# Patient Record
Sex: Male | Born: 1955 | Race: Black or African American | Hispanic: No | Marital: Single | Smoking: Current every day smoker
Health system: Southern US, Community
[De-identification: ages and names within clinical notes are randomized; demographics above are authoritative.]

## PROBLEM LIST (undated history)

## (undated) DIAGNOSIS — I639 Cerebral infarction, unspecified: Secondary | ICD-10-CM

## (undated) DIAGNOSIS — C801 Malignant (primary) neoplasm, unspecified: Secondary | ICD-10-CM

## (undated) DIAGNOSIS — I519 Heart disease, unspecified: Secondary | ICD-10-CM

## (undated) DIAGNOSIS — J189 Pneumonia, unspecified organism: Secondary | ICD-10-CM

## (undated) DIAGNOSIS — M87059 Idiopathic aseptic necrosis of unspecified femur: Secondary | ICD-10-CM

## (undated) DIAGNOSIS — K219 Gastro-esophageal reflux disease without esophagitis: Secondary | ICD-10-CM

## (undated) DIAGNOSIS — I8290 Acute embolism and thrombosis of unspecified vein: Secondary | ICD-10-CM

## (undated) DIAGNOSIS — I6523 Occlusion and stenosis of bilateral carotid arteries: Secondary | ICD-10-CM

## (undated) DIAGNOSIS — I739 Peripheral vascular disease, unspecified: Secondary | ICD-10-CM

## (undated) DIAGNOSIS — I10 Essential (primary) hypertension: Secondary | ICD-10-CM

## (undated) DIAGNOSIS — I2510 Atherosclerotic heart disease of native coronary artery without angina pectoris: Secondary | ICD-10-CM

## (undated) DIAGNOSIS — J45909 Unspecified asthma, uncomplicated: Secondary | ICD-10-CM

## (undated) HISTORY — DX: Cerebral infarction, unspecified: I63.9

## (undated) HISTORY — DX: Peripheral vascular disease, unspecified: I73.9

## (undated) HISTORY — PX: WRIST SURGERY: SHX841

## (undated) HISTORY — PX: FEMORAL BYPASS: SHX50

## (undated) HISTORY — DX: Occlusion and stenosis of bilateral carotid arteries: I65.23

## (undated) HISTORY — PX: MANDIBLE SURGERY: SHX707

---

## 2007-01-22 ENCOUNTER — Emergency Department: Payer: Self-pay | Admitting: Unknown Physician Specialty

## 2010-10-28 ENCOUNTER — Emergency Department: Payer: Self-pay | Admitting: Emergency Medicine

## 2010-11-06 ENCOUNTER — Emergency Department: Payer: Self-pay

## 2011-02-01 ENCOUNTER — Emergency Department: Payer: Self-pay | Admitting: Emergency Medicine

## 2014-02-05 ENCOUNTER — Observation Stay: Payer: Self-pay | Admitting: Internal Medicine

## 2014-02-05 LAB — CK TOTAL AND CKMB (NOT AT ARMC)
CK, TOTAL: 182 U/L
CK, Total: 172 U/L
CK, Total: 171 U/L
CK-MB: 1.9 ng/mL (ref 0.5–3.6)
CK-MB: 5.8 ng/mL — ABNORMAL HIGH (ref 0.5–3.6)
CK-MB: 6.2 ng/mL — ABNORMAL HIGH (ref 0.5–3.6)

## 2014-02-05 LAB — CK-MB: CK-MB: 4.9 ng/mL — AB (ref 0.5–3.6)

## 2014-02-05 LAB — CBC
HCT: 46.8 % (ref 40.0–52.0)
HGB: 15 g/dL (ref 13.0–18.0)
MCH: 30 pg (ref 26.0–34.0)
MCHC: 32.1 g/dL (ref 32.0–36.0)
MCV: 93 fL (ref 80–100)
Platelet: 233 10*3/uL (ref 150–440)
RBC: 5.01 10*6/uL (ref 4.40–5.90)
RDW: 14.1 % (ref 11.5–14.5)
WBC: 6.5 10*3/uL (ref 3.8–10.6)

## 2014-02-05 LAB — COMPREHENSIVE METABOLIC PANEL
ALK PHOS: 93 U/L
ANION GAP: 6 — AB (ref 7–16)
AST: 36 U/L (ref 15–37)
Albumin: 3.5 g/dL (ref 3.4–5.0)
BILIRUBIN TOTAL: 0.6 mg/dL (ref 0.2–1.0)
BUN: 15 mg/dL (ref 7–18)
CREATININE: 1.34 mg/dL — AB (ref 0.60–1.30)
Calcium, Total: 8.7 mg/dL (ref 8.5–10.1)
Chloride: 110 mmol/L — ABNORMAL HIGH (ref 98–107)
Co2: 24 mmol/L (ref 21–32)
EGFR (Non-African Amer.): 58 — ABNORMAL LOW
Glucose: 100 mg/dL — ABNORMAL HIGH (ref 65–99)
Osmolality: 280 (ref 275–301)
Potassium: 4.2 mmol/L (ref 3.5–5.1)
SGPT (ALT): 30 U/L
Sodium: 140 mmol/L (ref 136–145)
Total Protein: 8.1 g/dL (ref 6.4–8.2)

## 2014-02-05 LAB — CBC WITH DIFFERENTIAL/PLATELET
BASOS ABS: 0.1 10*3/uL (ref 0.0–0.1)
Basophil %: 2 %
Eosinophil #: 0.7 10*3/uL (ref 0.0–0.7)
Eosinophil %: 12.3 %
HCT: 45.7 % (ref 40.0–52.0)
HGB: 14.5 g/dL (ref 13.0–18.0)
LYMPHS PCT: 46.4 %
Lymphocyte #: 2.8 10*3/uL (ref 1.0–3.6)
MCH: 30.1 pg (ref 26.0–34.0)
MCHC: 31.8 g/dL — ABNORMAL LOW (ref 32.0–36.0)
MCV: 95 fL (ref 80–100)
MONOS PCT: 11.4 %
Monocyte #: 0.7 x10 3/mm (ref 0.2–1.0)
Neutrophil #: 1.7 10*3/uL (ref 1.4–6.5)
Neutrophil %: 27.9 %
Platelet: 223 10*3/uL (ref 150–440)
RBC: 4.81 10*6/uL (ref 4.40–5.90)
RDW: 14 % (ref 11.5–14.5)
WBC: 6 10*3/uL (ref 3.8–10.6)

## 2014-02-05 LAB — PROTIME-INR
INR: 1
Prothrombin Time: 13.4 secs (ref 11.5–14.7)

## 2014-02-05 LAB — TROPONIN I
TROPONIN-I: 1.9 ng/mL — AB
TROPONIN-I: 2.3 ng/mL — AB
Troponin-I: 0.03 ng/mL

## 2014-02-05 LAB — HEPARIN LEVEL (UNFRACTIONATED): Anti-Xa(Unfractionated): 0.65 IU/mL (ref 0.30–0.70)

## 2014-02-05 LAB — APTT

## 2014-02-06 LAB — CBC WITH DIFFERENTIAL/PLATELET
Basophil #: 0.1 10*3/uL (ref 0.0–0.1)
Basophil %: 0.9 %
EOS ABS: 0.8 10*3/uL — AB (ref 0.0–0.7)
EOS PCT: 11 %
HCT: 41.8 % (ref 40.0–52.0)
HGB: 13.6 g/dL (ref 13.0–18.0)
LYMPHS PCT: 52.3 %
Lymphocyte #: 3.9 10*3/uL — ABNORMAL HIGH (ref 1.0–3.6)
MCH: 30.5 pg (ref 26.0–34.0)
MCHC: 32.6 g/dL (ref 32.0–36.0)
MCV: 94 fL (ref 80–100)
MONO ABS: 0.7 x10 3/mm (ref 0.2–1.0)
Monocyte %: 8.9 %
NEUTROS ABS: 2 10*3/uL (ref 1.4–6.5)
NEUTROS PCT: 26.9 %
PLATELETS: 215 10*3/uL (ref 150–440)
RBC: 4.47 10*6/uL (ref 4.40–5.90)
RDW: 13.9 % (ref 11.5–14.5)
WBC: 7.4 10*3/uL (ref 3.8–10.6)

## 2014-02-06 LAB — DRUG SCREEN, URINE
AMPHETAMINES, UR SCREEN: NEGATIVE (ref ?–1000)
BENZODIAZEPINE, UR SCRN: NEGATIVE (ref ?–200)
Barbiturates, Ur Screen: NEGATIVE (ref ?–200)
Cannabinoid 50 Ng, Ur ~~LOC~~: NEGATIVE (ref ?–50)
Cocaine Metabolite,Ur ~~LOC~~: POSITIVE (ref ?–300)
MDMA (ECSTASY) UR SCREEN: NEGATIVE (ref ?–500)
Methadone, Ur Screen: NEGATIVE (ref ?–300)
OPIATE, UR SCREEN: NEGATIVE (ref ?–300)
Phencyclidine (PCP) Ur S: NEGATIVE (ref ?–25)
Tricyclic, Ur Screen: NEGATIVE (ref ?–1000)

## 2014-02-06 LAB — BASIC METABOLIC PANEL
Anion Gap: 10 (ref 7–16)
BUN: 16 mg/dL (ref 7–18)
Calcium, Total: 8.8 mg/dL (ref 8.5–10.1)
Chloride: 105 mmol/L (ref 98–107)
Co2: 25 mmol/L (ref 21–32)
Creatinine: 1.19 mg/dL (ref 0.60–1.30)
EGFR (Non-African Amer.): >60
GLUCOSE: 99 mg/dL (ref 65–99)
Osmolality: 281 (ref 275–301)
Potassium: 3.8 mmol/L (ref 3.5–5.1)
SODIUM: 140 mmol/L (ref 136–145)

## 2014-02-06 LAB — HEPARIN LEVEL (UNFRACTIONATED): ANTI-XA(UNFRACTIONATED): 0.56 [IU]/mL (ref 0.30–0.70)

## 2014-11-03 NOTE — Consult Note (Signed)
Present Illness 59 year old male with history of substance abuse presents to the emergency room with left-sided chest pain.  Patient is a very diffuse historian.  He initially denies any prior cardiac history but states he was recently at [facility] in told that he had a probable  cardiac problem that needed to be evaluated by a cardiologist.  He has not seen 1 per his report.  It is unclear with the is taken any medications.  He denies illicit drug use at present.  He states the pain is worse with deep palpation as well as deep breathing.  The his electrocardiogram shows no injury current.  His initial troponin was normal subsequent lawn was 1.9.  He has mild renal insufficiency.  He etiology the pain is unclear but certainly could be secondary to coronary disease.  A tox screen is pending.   Physical Exam:  GEN no acute distress, thin   HEENT PERRL, hearing intact to voice   NECK supple   RESP clear BS   CARD Regular rate and rhythm  No murmur   ABD denies tenderness  normal BS   LYMPH negative neck, negative axillae   EXTR negative cyanosis/clubbing, negative edema   SKIN normal to palpation   NEURO cranial nerves intact, motor/sensory function intact   PSYCH A+O to time, place, person   Review of Systems:  Subjective/Chief Complaint African American male complaining of chest pain   General: Weakness   Skin: No Complaints   ENT: No Complaints   Eyes: No Complaints   Neck: No Complaints   Respiratory: Short of breath   Cardiovascular: Chest pain or discomfort  Tightness   Gastrointestinal: No Complaints   Genitourinary: No Complaints   Vascular: No Complaints   Musculoskeletal: No Complaints   Neurologic: No Complaints   Hematologic: No Complaints   Endocrine: No Complaints   Psychiatric: No Complaints   Review of Systems: All other systems were reviewed and found to be negative   Medications/Allergies Reviewed Medications/Allergies reviewed    Family & Social History:  Family and Social History:  Family History Coronary Artery Disease  Smoking   Social History positive  tobacco, positive Illicit drugs   + Tobacco Current (within 1 year)   EKG:  EKG NSR   Abnormal NSSTTW changes   Interpretation nonspecific ST T wave changes    Penicillin: N/V   Impression 59 year old male with a diffuse history.  He states he has been a [facility] in told he needed to see a heart doctor.  He apparently has not done that.  He was evaluated there recently records currently unavailable.  He states he has left-sided chest pain.  This is worsened with deep breathing.  It also was worse with deep palpation.  He has a mild troponin of 1.19.  He also has mild renal insufficiency.  Would continue with nitrates and heparin and rule out for myocardial infarction.  Will proceed with an echocardiogram to evaluate LV function as well as to evaluate for wall motion abnormality valvular abnormalities.  Based on his clinical course over the next 12-24 hr, will determine medical therapy versus is invasive evaluation.   Plan 1. Continue to rule out for myocardial infarction 2. Echocardiogram to evaluate LV function 3. Continue with heparin nitrates aspirin.  Will hold beta-blockers until cocaine history is elucidated with tox screen 4. Attempt to obtain records from [facility] 5. Consider invasive evaluation versus empiric medical therapy depending on the records obtained in  the patient's clinical course.   Electronic Signatures: Teodoro Spray (MD)  (Signed 27-Jul-15 16:57)  Authored: General Aspect/Present Illness, History and Physical Exam, Review of System, Family & Social History, EKG , Allergies, Impression/Plan   Last Updated: 27-Jul-15 16:57 by Teodoro Spray (MD)

## 2014-11-03 NOTE — H&P (Signed)
PATIENT NAME:  Timothy Galvan, Timothy Galvan MR#:  [id number] DATE OF BIRTH:  August 17, 1955  DATE OF ADMISSION:  02/05/2014  PRIMARY CARE PHYSICIAN:  Dr. Shade Flood with [facility].   REFERRING EMERGENCY ROOM PHYSICIAN:  Dr. Jacqualine Code.  CHIEF COMPLAINT: Chest pain.   HISTORY OF PRESENT ILLNESS:  This 59 year old man with medical history of COPD, hyperlipidemia, peptic ulcer disease and probable coronary artery disease, presents this morning with left-sided chest pain. He reports that he woke up and walked around, then upon sitting back down had acute onset of sharp left-sided chest pain, left neck pain and left arm numbness. He attempted to take an antacid, ate baking soda and tomatoes, with no relief. The pain, increased and he became short of breath. He used his inhaler which helped slightly but still felt chest pressure and significant shortness of breath. He also reports that he was diaphoretic, cold, nauseated. He reports that over the past few weeks he has had substernal chest pain, different than the symptoms described above, which he has been attributing to peptic ulcer disease or gastroesophageal reflux.   In the Emergency Room he continues to have left shoulder pain and hand numbness. He reports that he was at the Emergency Room of one at the [place] 2-3 weeks ago where he was told that he had some sort of cardiac disease and irregular heart rate and was referred to a cardiologist. He has never seen a cardiologist at this time.   PAST MEDICAL HISTORY:  1.  History of peptic ulcer disease and/or gastroesophageal reflux.  2.  COPD.  3.  Hyperlipidemia.  4.  History of colon polyps.   PAST SURGICAL HISTORY: 1.  Right wrist repair.  2.  Ganglion cyst removal on the left hand.   ALLERGIES: PENICILLIN CAUSES NAUSEA AND VOMITING.   HOME MEDICATIONS:  1.  Omeprazole 20 mg orally once a day.  2.  Nitroglycerin 0.4 mg as needed for chest pain.  3.  Lipitor 40 mg daily.   4.  Isosorbide mononitrate 30 mg extended release once a day.  5.  Ibuprofen 600 mg every 6-8 hours as needed for pain.  6.  Combivent 1 puff inhaled every 6 hours as needed for shortness of breath.  7.  Aspirin 81 mg.   SOCIAL HISTORY: The patient lives alone in [place]. He is disabled due to back pain. He formerly worked at [employer]. He currently smokes 1 pack per day. He does drink beer, stopped drinking liquor because it exacerbated his peptic ulcer disease about 1 year ago. He reports occasional cocaine use.   FAMILY HISTORY: Positive for coronary artery disease, diabetes, hypertension, stroke, colon cancer in both parents, breast cancer in his mother.   REVIEW OF SYSTEMS:  CONSTITUTIONAL: Denies fever, weight loss or weight gain, positive for diaphoresis, fatigue.  HEENT: Negative for change in vision, eye pain, eye inflammation, change in hearing, oral pain, difficulty swallowing.  RESPIRATORY: Positive for shortness of breath, negative for cough, wheezing, hemoptysis, painful respiration.  CARDIOVASCULAR: Positive for chest pain, negative for edema, palpitations, syncope.  GASTROINTESTINAL: Negative for vomiting, or diarrhea.  Positive for some nausea earlier this morning, negative for abdominal pain, negative for hematemesis, or hematochezia over the past few days.  GENITOURINARY: Negative for dysuria or frequency.  MUSCULOSKELETAL: Positive for left-sided neck and shoulder pain, otherwise no other muscle or joint pain.  NEUROLOGIC: Positive for numbness of the left arm, no weakness, no dysarthria or confusion, no syncope, no  seizure.  PSYCHIATRIC: Negative for anxiety or depression.   PHYSICAL EXAMINATION:  VITAL SIGNS:  Temperature 98.6, pulse 60, respirations 20, blood pressure 158/80, oxygenation 98% on room air.  GENERAL: The patient is alert, oriented, resting calmly, in the exam bed.  HEENT: Pupils are equal, round, and reactive, conjunctiva are clear, extraocular  motion is intact. There is no nasal discharge or deformity, poor dentition with multiple teeth missing, gingival disease, no oral ulcers or exudate, trachea is midline, he does have shotty lymphadenopathy in the anterior cervical chain, no thyromegaly.  PULMONARY: Lungs are clear to auscultation bilaterally with good air movement, no respiratory distress.  CARDIOVASCULAR: Regular rate and rhythm, no murmurs, rubs, or gallops, peripheral pulses are 2+, there is no edema.  GASTROINTESTINAL: Bowel sounds are normal, there is no tenderness, guarding, rebound, mass, abdomen is not distended.  MUSCULOSKELETAL: Normal range of motion of the neck and left shoulder, there is decreased sensation in the left hand, there are no tender or swollen joints, strength and tone are equal bilaterally.  SKIN: No rash, diaphoresis, wounds or lesions.  NEUROLOGIC: Cranial nerves II through XII are grossly intact, neurologic examination is nonfocal.  PSYCHIATRIC: Behavior is appropriate, no signs of depression and anxiety.   LABORATORY DATA:  Sodium 140, potassium 4.2, chloride 110, bicarbonate 24, creatinine 1.34, BUN is 15. Glucose is 100, alkaline phosphatase 93, ALT 30, AST 36, protein 8.1, albumin 3.5, troponin is 0.03, CK 172, CK-MB 1.9, white blood cell count 6.5, hemoglobin 15, platelets 233, MCV is 93.   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 No acute cardiopulmonary disease. Emphysematous changes in the upper lobes.   ASSESSMENT AND PLAN: 1.  Chest pain: The patient is admitted to telemetry for acute coronary syndrome rule out. He is currently chest pain free. He has mild changes on his EKG, however, there are no prior EKGs for comparison and these have been requested from [facility]. Currently cardiac enzymes are negative, will continue to cycle. Will observe on telemetry. We will continue with aspirin, statin, nitroglycerin. Will hold off on echocardiogram, as he has been recently admitted to [facility]  and this study may have been done within the past month.  2.  Hypertension: He reports that he does not have hypertension chronically, blood pressures are elevated today. He is on Imdur at home but no other antihypertensive medications. I will start lisinopril at 10 mg.  3.  Polysubstance abuse with cocaine and alcohol: Will obtain a social work consultation. He was counseled regarding the cardiac risks of cocaine use. He understands that alcohol use exacerbates his peptic ulcer disease.  4.  Chronic obstructive pulmonary disease: Stable, oxygen saturations and respiratory status are excellent at this time. Continue home regimen.  5.  Peptic ulcer disease/gastroesophageal reflux disease: Continue with PPI.   Time spent on admission 45 minutes.     ____________________________ Earleen Newport. Volanda Napoleon, MD cpw:lt D: 02/05/2014 12:45:44 ET T: 02/05/2014 13:47:34 ET JOB#: [id number]  cc: Barnetta Chapel P. Volanda Napoleon, MD, <Dictator> Aldean Jewett MD ELECTRONICALLY SIGNED 02/12/2014 16:01

## 2014-11-03 NOTE — Discharge Summary (Signed)
PATIENT NAME:  Timothy Galvan, Timothy Galvan MR#:  [id number] DATE OF BIRTH:  Jan 08, 1956  DATE OF ADMISSION:  02/05/2014 DATE OF DISCHARGE:  02/06/2014  DISPOSITION: Home.   DISCHARGE DIAGNOSES: 1.  Chest pain secondary to cocaine use.  2.  Nonischemic cardiomyopathy.  3.  Peripheral vascular disease with appears.   DISCHARGE MEDICATIONS: 1.  Aspirin 81 mg daily.  2.  Ibuprofen 600 mg every 6-8 hours as needed for headache.  3.  Omeprazole  20 mg p.o. daily.  4.  Nitroglycerin sublingual 0.4 mg every 5 minutes for chest pain.  5.  Lipitor 40 mg for coronary artery disease daily.  6.  Combivent Respimat 1 puff b.i.d.  7.  Imdur 30 mg p.o. daily.  8.  Spiriva 80 mcg inhalation daily.  Discontinue the Coreg on discharge instructions.   DISCHARGE SIGNS: Temperature 98.1, heart rate 68, blood pressure 122/70, saturation 98%, heart rate varying between 40s-60s and most of it is in 40s.   CONSULTATIONS: Cardiology consult with Dr. Ubaldo Glassing.   HOSPITAL COURSE: A 59 year old African American male with history of tobacco abuse, alcohol abuse, polysubstance abuse. Comes in because of left-sided chest pain. The patient's EKG was abnormal and the patient's initial troponin was normal. The patient's 2nd troponin was every to initial troponin 0.03, 2nd troponin went up to 1.9. The patient admitted to hospitalist service for non-ST-elevation MI, started on aspirin and heparin drip. The patient had a cardiac catheterization done this morning and the patient's cardiac catheter showed (normal   coronary arteries and the patient had global LV function, depressed at 35%. The patient has a nonischemic cardiomyopathy. The patient's other labs were normal with CBC and met-B.  The patient was seen by Dr. Ubaldo Glassing.  Also he suggested that medical management for nonischemic  cardiomyopathy including aspirin, statins, ACE inhibitors and beta blockers, but his urine toxicology came back positive with cocaine, so we told the patient he cannot  have Coreg until he stops taking cocaine and his chest pain is probably secondary to his vascular spasm secondary to cocaine use and troponin elevation, likely secondary to cocaine use. The patient told me that he started to smoke over the weekend and in the smoke, he had cocaine which was done without him knowing that the patient was advised strongly to stop using illicit drugs and the patient says that he does have a problem with circulation in the leg and supposed to see vascular surgeon,. The patient right now is on aspirin, statins, ACE inhibitors and nitrites and the patient will continue other medications except the Coreg. He can continue any inhalers.  I advised him to quit smoking.   TIME SPENT ON DISCHARGE PREPARATION: More than 30 minutes.   ____________________________ Epifanio Lesches, MD sk:ds D: 02/06/2014 14:08:58 ET T: 02/06/2014 14:44:28 ET JOB#: [id number]  cc: Epifanio Lesches, MD, <Dictator> Epifanio Lesches MD ELECTRONICALLY SIGNED 02/16/2014 17:55

## 2016-01-09 ENCOUNTER — Emergency Department
Admission: EM | Admit: 2016-01-09 | Discharge: 2016-01-09 | Disposition: A | Discharge status: Home / Self Care | Payer: Medicare Other | Attending: Emergency Medicine | Admitting: Emergency Medicine

## 2016-01-09 ENCOUNTER — Encounter: Payer: Self-pay | Admitting: Emergency Medicine

## 2016-01-09 ENCOUNTER — Emergency Department: Payer: Medicare Other

## 2016-01-09 DIAGNOSIS — R0789 Other chest pain: Secondary | ICD-10-CM | POA: Diagnosis not present

## 2016-01-09 DIAGNOSIS — Z86718 Personal history of other venous thrombosis and embolism: Secondary | ICD-10-CM | POA: Insufficient documentation

## 2016-01-09 DIAGNOSIS — F17200 Nicotine dependence, unspecified, uncomplicated: Secondary | ICD-10-CM | POA: Diagnosis not present

## 2016-01-09 DIAGNOSIS — I2510 Atherosclerotic heart disease of native coronary artery without angina pectoris: Secondary | ICD-10-CM | POA: Diagnosis not present

## 2016-01-09 DIAGNOSIS — R079 Chest pain, unspecified: Secondary | ICD-10-CM | POA: Diagnosis present

## 2016-01-09 DIAGNOSIS — R0600 Dyspnea, unspecified: Secondary | ICD-10-CM | POA: Insufficient documentation

## 2016-01-09 HISTORY — DX: Acute embolism and thrombosis of unspecified vein: I82.90

## 2016-01-09 HISTORY — DX: Heart disease, unspecified: I51.9

## 2016-01-09 LAB — CBC WITH DIFFERENTIAL/PLATELET
Basophils Absolute: 0.1 10*3/uL (ref 0–0.1)
Basophils Relative: 1 %
EOS PCT: 2 %
Eosinophils Absolute: 0.2 10*3/uL (ref 0–0.7)
HCT: 39.7 % — ABNORMAL LOW (ref 40.0–52.0)
Hemoglobin: 13.3 g/dL (ref 13.0–18.0)
LYMPHS ABS: 3 10*3/uL (ref 1.0–3.6)
LYMPHS PCT: 47 %
MCH: 31 pg (ref 26.0–34.0)
MCHC: 33.4 g/dL (ref 32.0–36.0)
MCV: 92.6 fL (ref 80.0–100.0)
MONO ABS: 0.6 10*3/uL (ref 0.2–1.0)
MONOS PCT: 10 %
Neutro Abs: 2.6 10*3/uL (ref 1.4–6.5)
Neutrophils Relative %: 40 %
PLATELETS: 262 10*3/uL (ref 150–440)
RBC: 4.29 MIL/uL — ABNORMAL LOW (ref 4.40–5.90)
RDW: 13.8 % (ref 11.5–14.5)
WBC: 6.5 10*3/uL (ref 3.8–10.6)

## 2016-01-09 LAB — BASIC METABOLIC PANEL
Anion gap: 7 (ref 5–15)
BUN: 23 mg/dL — AB (ref 6–20)
CHLORIDE: 105 mmol/L (ref 101–111)
CO2: 26 mmol/L (ref 22–32)
CREATININE: 1.45 mg/dL — AB (ref 0.61–1.24)
Calcium: 9 mg/dL (ref 8.9–10.3)
GFR calc Af Amer: 59 mL/min — ABNORMAL LOW (ref >60)
GFR calc non Af Amer: 51 mL/min — ABNORMAL LOW (ref >60)
GLUCOSE: 101 mg/dL — AB (ref 65–99)
POTASSIUM: 3.9 mmol/L (ref 3.5–5.1)
Sodium: 138 mmol/L (ref 135–145)

## 2016-01-09 LAB — TROPONIN I: Troponin I: <0.03 ng/mL (ref <0.03)

## 2016-01-09 MED ORDER — IOPAMIDOL (ISOVUE-370) INJECTION 76%
75.0000 mL | Freq: Once | INTRAVENOUS | Status: AC | PRN
  Administered 2016-01-09: 75 mL via INTRAVENOUS

## 2016-01-09 MED ORDER — FAMOTIDINE 20 MG PO TABS: 20.0000 mg | ORAL_TABLET | Freq: Two times a day (BID) | ORAL | Status: DC

## 2016-01-09 MED ORDER — GI COCKTAIL ~~LOC~~
30.0000 mL | ORAL | Status: AC
  Administered 2016-01-09: 30 mL via ORAL

## 2016-01-09 MED ORDER — FAMOTIDINE 20 MG PO TABS
40.0000 mg | ORAL_TABLET | Freq: Once | ORAL | Status: AC
  Administered 2016-01-09: 40 mg via ORAL

## 2016-01-09 MED ORDER — SUCRALFATE 1 G PO TABS: 1.0000 g | ORAL_TABLET | Freq: Four times a day (QID) | ORAL | Status: DC

## 2016-01-09 NOTE — ED Notes (Signed)
Pt reports cp and sob x 2 weeks. States it is intermittent, sharp at times, and pressure most of the time. Pt alert & oriented with NAD noted.

## 2016-01-09 NOTE — Discharge Instructions (Signed)
Nonspecific Chest Pain  °Chest pain can be caused by many different conditions. There is always a chance that your pain could be related to something serious, such as a heart attack or a blood clot in your lungs. Chest pain can also be caused by conditions that are not life-threatening. If you have chest pain, it is very important to follow up with your health care provider. °CAUSES  °Chest pain can be caused by: °· Heartburn. °· Pneumonia or bronchitis. °· Anxiety or stress. °· Inflammation around your heart (pericarditis) or lung (pleuritis or pleurisy). °· A blood clot in your lung. °· A collapsed lung (pneumothorax). It can develop suddenly on its own (spontaneous pneumothorax) or from trauma to the chest. °· Shingles infection (varicella-zoster virus). °· Heart attack. °· Damage to the bones, muscles, and cartilage that make up your chest wall. This can include: °¨ Bruised bones due to injury. °¨ Strained muscles or cartilage due to frequent or repeated coughing or overwork. °¨ Fracture to one or more ribs. °¨ Sore cartilage due to inflammation (costochondritis). °RISK FACTORS  °Risk factors for chest pain may include: °· Activities that increase your risk for trauma or injury to your chest. °· Respiratory infections or conditions that cause frequent coughing. °· Medical conditions or overeating that can cause heartburn. °· Heart disease or family history of heart disease. °· Conditions or health behaviors that increase your risk of developing a blood clot. °· Having had chicken pox (varicella zoster). °SIGNS AND SYMPTOMS °Chest pain can feel like: °· Burning or tingling on the surface of your chest or deep in your chest. °· Crushing, pressure, aching, or squeezing pain. °· Dull or sharp pain that is worse when you move, cough, or take a deep breath. °· Pain that is also felt in your back, neck, shoulder, or arm, or pain that spreads to any of these areas. °Your chest pain may come and go, or it may stay  constant. °DIAGNOSIS °Lab tests or other studies may be needed to find the cause of your pain. Your health care provider may have you take a test called an ambulatory ECG (electrocardiogram). An ECG records your heartbeat patterns at the time the test is performed. You may also have other tests, such as: °· Transthoracic echocardiogram (TTE). During echocardiography, sound waves are used to create a picture of all of the heart structures and to look at how blood flows through your heart. °· Transesophageal echocardiogram (TEE). This is a more advanced imaging test that obtains images from inside your body. It allows your health care provider to see your heart in finer detail. °· Cardiac monitoring. This allows your health care provider to monitor your heart rate and rhythm in real time. °· Holter monitor. This is a portable device that records your heartbeat and can help to diagnose abnormal heartbeats. It allows your health care provider to track your heart activity for several days, if needed. °· Stress tests. These can be done through exercise or by taking medicine that makes your heart beat more quickly. °· Blood tests. °· Imaging tests. °TREATMENT  °Your treatment depends on what is causing your chest pain. Treatment may include: °· Medicines. These may include: °¨ Acid blockers for heartburn. °¨ Anti-inflammatory medicine. °¨ Pain medicine for inflammatory conditions. °¨ Antibiotic medicine, if an infection is present. °¨ Medicines to dissolve blood clots. °¨ Medicines to treat coronary artery disease. °· Supportive care for conditions that do not require medicines. This may include: °¨ Resting. °¨ Applying heat   or cold packs to injured areas. °¨ Limiting activities until pain decreases. °HOME CARE INSTRUCTIONS °· If you were prescribed an antibiotic medicine, finish it all even if you start to feel better. °· Avoid any activities that bring on chest pain. °· Do not use any tobacco products, including  cigarettes, chewing tobacco, or electronic cigarettes. If you need help quitting, ask your health care provider. °· Do not drink alcohol. °· Take medicines only as directed by your health care provider. °· Keep all follow-up visits as directed by your health care provider. This is important. This includes any further testing if your chest pain does not go away. °· If heartburn is the cause for your chest pain, you may be told to keep your head raised (elevated) while sleeping. This reduces the chance that acid will go from your stomach into your esophagus. °· Make lifestyle changes as directed by your health care provider. These may include: °¨ Getting regular exercise. Ask your health care provider to suggest some activities that are safe for you. °¨ Eating a heart-healthy diet. A registered dietitian can help you to learn healthy eating options. °¨ Maintaining a healthy weight. °¨ Managing diabetes, if necessary. °¨ Reducing stress. °SEEK MEDICAL CARE IF: °· Your chest pain does not go away after treatment. °· You have a rash with blisters on your chest. °· You have a fever. °SEEK IMMEDIATE MEDICAL CARE IF:  °· Your chest pain is worse. °· You have an increasing cough, or you cough up blood. °· You have severe abdominal pain. °· You have severe weakness. °· You faint. °· You have chills. °· You have sudden, unexplained chest discomfort. °· You have sudden, unexplained discomfort in your arms, back, neck, or jaw. °· You have shortness of breath at any time. °· You suddenly start to sweat, or your skin gets clammy. °· You feel nauseous or you vomit. °· You suddenly feel light-headed or dizzy. °· Your heart begins to beat quickly, or it feels like it is skipping beats. °These symptoms may represent a serious problem that is an emergency. Do not wait to see if the symptoms will go away. Get medical help right away. Call your local emergency services (911 in the [place] Do not drive yourself to the hospital. °  °This  information is not intended to replace advice given to you by your health care provider. Make sure you discuss any questions you have with your health care provider. °  °Document Released: 04/08/2005 Document Revised: 07/20/2014 Document Reviewed: 02/02/2014 °[employer] Interactive Patient Education ©2016 [employer] ° °

## 2016-01-09 NOTE — ED Provider Notes (Signed)
[facility] Emergency Department Provider Note  ____________________________________________  Time seen: 9:50 AM  I have reviewed the triage vital signs and the nursing notes.   HISTORY  Chief Complaint Chest Pain   HPI Timothy Galvan is a 60 y.o. male who complains of intermittent chest pain shortness of breath for the past 2 weeks. Last 4 a few minutes to a few hours at a time. Sharp, hurts to breathe and cough. Cough is nonproductive. Nonradiating. Not exertional. No associated diaphoresis vomiting dizziness or syncope.  He has chronic angina for which she takes nitroglycerin. This feels different and he has not taken nitroglycerin for it at home today.  With cardiology at [facility] and vascular surgery [facility] he's recently had angiography of his iliac arteries to evaluate occlusion. Also reports a cardiac catheterization about one year ago which did not show any blockages and had no intervention. Also reports a stress test within the last few months which was unremarkable.  Electronic medical record reveals a nuclear medicine stress test performed in March which was unremarkable: Exam: NM MYOCARDIAL PERFUSION SPECT MULTIPLE (One-Day Rest/Stress Tc-37M  Sestamibi - Regadenoson)  ++++++++++++++++++++++++++++++++++++++++++++ Impressions: - Normal myocardial perfusion study  - No evidence for significant ischemia or scar is noted.  - Post stress: Global systolic function is normal. The ejection fraction  calculated at 54%.   ++++++++++++++++++++++++++++++++++++++++++++  Name: Timothy Galvan, Timothy Galvan (Male) Date of Birth: 13-Nov-1955 [phone]) Accession #: [id number] [facility] ID: [id number] Date of Procedure: 10/02/2015    Past medical history Left iliac artery occlusion CAD   There are no active problems to display for this patient.    History reviewed. No pertinent past surgical history. Cardiac catheterization Iliac angiography  Current Outpatient Rx  Name  Route   Sig  Dispense  Refill  . famotidine (PEPCID) 20 MG tablet   Oral   Take 1 tablet (20 mg total) by mouth 2 (two) times daily.   60 tablet   0   . sucralfate (CARAFATE) 1 g tablet   Oral   Take 1 tablet (1 g total) by mouth 4 (four) times daily.   120 tablet   1      Allergies Review of patient's allergies indicates no known allergies.   History reviewed. No pertinent family history.  Social History Social History  Substance Use Topics  . Smoking status: Current Every Day Smoker  . Smokeless tobacco: None  . Alcohol Use: 7.2 oz/week    12 Cans of beer per week    Review of Systems  Constitutional:   No fever or chills.  ENT:   No sore throat. No rhinorrhea. Cardiovascular:   Positive as above chest pain. Respiratory:   Positive dyspnea with nonproductive cough Gastrointestinal:   Negative for abdominal pain, vomiting and diarrhea.  Genitourinary:   Negative for dysuria or difficulty urinating. Musculoskeletal:   Negative for focal pain or swelling Neurological:   Negative for headaches 10-point ROS otherwise negative.  ____________________________________________   PHYSICAL EXAM:  VITAL SIGNS: ED Triage Vitals  Enc Vitals Group     BP 01/09/16 0935 140/77 mmHg     Pulse Rate 01/09/16 0935 66     Resp 01/09/16 0935 20     Temp 01/09/16 0935 97.5 F (36.4 C)     Temp Source 01/09/16 0935 Oral     SpO2 01/09/16 0935 98 %     Weight 01/09/16 0935 134 lb (60.782 kg)     Height 01/09/16 0935 5\' 2"  (1.575 m)  Head Cir --      Peak Flow --      Pain Score 01/09/16 0935 7     Pain Loc --      Pain Edu? --      Excl. in [place]? --     Vital signs reviewed, nursing assessments reviewed.   Constitutional:   Alert and oriented. Well appearing and in no distress. Eyes:   No scleral icterus. No conjunctival pallor. PERRL. EOMI.  No nystagmus. ENT   Head:   Normocephalic and atraumatic.   Nose:   No congestion/rhinnorhea. No septal hematoma    Mouth/Throat:   MMM, no pharyngeal erythema. No peritonsillar mass.    Neck:   No stridor. No SubQ emphysema. No meningismus. Hematological/Lymphatic/Immunilogical:   No cervical lymphadenopathy. Cardiovascular:   RRR. Symmetric bilateral radial and DP pulses.  No murmurs.  Respiratory:   Normal respiratory effort without tachypnea nor retractions. Breath sounds are clear and equal bilaterally. No wheezes/rales/rhonchi. Gastrointestinal:   Soft and nontender. Non distended. There is no CVA tenderness.  No rebound, rigidity, or guarding. Genitourinary:   deferred Musculoskeletal:   Nontender with normal range of motion in all extremities. No joint effusions.  No lower extremity tenderness.  No edema. Neurologic:   Normal speech and language.  CN 2-10 normal. Motor grossly intact. No gross focal neurologic deficits are appreciated.  Skin:    Skin is warm, dry and intact. No rash noted.  No petechiae, purpura, or bullae.  ____________________________________________    LABS (pertinent positives/negatives) (all labs ordered are listed, but only abnormal results are displayed) Labs Reviewed  BASIC METABOLIC PANEL - Abnormal; Notable for the following:    Glucose, Bld 101 (*)    BUN 23 (*)    Creatinine, Ser 1.45 (*)    GFR calc non Af Amer 51 (*)    GFR calc Af Amer 59 (*)    All other components within normal limits  CBC WITH DIFFERENTIAL/PLATELET - Abnormal; Notable for the following:    RBC 4.29 (*)    HCT 39.7 (*)    All other components within normal limits  TROPONIN I   ____________________________________________   EKG  Interpreted by me Sinus rhythm rate of 64, normal axis and intervals. Left ventricular hypertrophy. No acute ischemic changes. Frequent PVCs with 4 PVCs on the strip.  ____________________________________________    RADIOLOGY  CT angiogram chest  unremarkable  ____________________________________________   PROCEDURES   ____________________________________________   INITIAL IMPRESSION / ASSESSMENT AND PLAN / ED COURSE  Pertinent labs & imaging results that were available during my care of the patient were reviewed by me and considered in my medical decision making (see chart for details).  Patient presents with chest pain shortness of breath which is pleuritic in nature but intermittent. This is very atypical. Not consistent with angina. Low suspicion for ACS dissection or pneumothorax. Given the nature of the symptoms and his medical history, a CT angiogram of the chest was performed to evaluate for PE. This was negative and did not reveal any evidence of pneumonia either. The patient is feeling somewhat better after an acids. Troponin negative. I'll have him follow-up with cardiology. He's recently had provocative testing with the MRI stress, so no further benefit to hospitalization at this time.     ____________________________________________   FINAL CLINICAL IMPRESSION(S) / ED DIAGNOSES  Final diagnoses:  Atypical chest pain       Portions of this note were generated with dragon dictation software. Dictation errors  may occur despite best attempts at proofreading.   Carrie Mew, MD 01/09/16 [phone]

## 2016-01-09 NOTE — ED Notes (Signed)
Patient transported to CT 

## 2016-08-05 ENCOUNTER — Emergency Department
Admission: EM | Admit: 2016-08-05 | Discharge: 2016-08-05 | Disposition: A | Discharge status: Home / Self Care | Payer: Medicare Other | Attending: Emergency Medicine | Admitting: Emergency Medicine

## 2016-08-05 ENCOUNTER — Emergency Department: Payer: Medicare Other

## 2016-08-05 ENCOUNTER — Encounter: Payer: Self-pay | Admitting: Emergency Medicine

## 2016-08-05 DIAGNOSIS — R05 Cough: Secondary | ICD-10-CM | POA: Diagnosis not present

## 2016-08-05 DIAGNOSIS — R0602 Shortness of breath: Secondary | ICD-10-CM | POA: Insufficient documentation

## 2016-08-05 DIAGNOSIS — F17200 Nicotine dependence, unspecified, uncomplicated: Secondary | ICD-10-CM | POA: Insufficient documentation

## 2016-08-05 DIAGNOSIS — R079 Chest pain, unspecified: Secondary | ICD-10-CM | POA: Insufficient documentation

## 2016-08-05 DIAGNOSIS — R6889 Other general symptoms and signs: Secondary | ICD-10-CM

## 2016-08-05 LAB — BASIC METABOLIC PANEL
ANION GAP: 9 (ref 5–15)
BUN: 14 mg/dL (ref 6–20)
CHLORIDE: 106 mmol/L (ref 101–111)
CO2: 25 mmol/L (ref 22–32)
Calcium: 9.1 mg/dL (ref 8.9–10.3)
Creatinine, Ser: 1.09 mg/dL (ref 0.61–1.24)
GFR calc non Af Amer: >60 mL/min (ref >60)
Glucose, Bld: 121 mg/dL — ABNORMAL HIGH (ref 65–99)
POTASSIUM: 4.2 mmol/L (ref 3.5–5.1)
SODIUM: 140 mmol/L (ref 135–145)

## 2016-08-05 LAB — CBC
HCT: 45.5 % (ref 40.0–52.0)
HEMOGLOBIN: 15.3 g/dL (ref 13.0–18.0)
MCH: 31.5 pg (ref 26.0–34.0)
MCHC: 33.5 g/dL (ref 32.0–36.0)
MCV: 93.9 fL (ref 80.0–100.0)
Platelets: 158 10*3/uL (ref 150–440)
RBC: 4.85 MIL/uL (ref 4.40–5.90)
RDW: 14.4 % (ref 11.5–14.5)
WBC: 3.8 10*3/uL (ref 3.8–10.6)

## 2016-08-05 LAB — TROPONIN I: Troponin I: <0.03 ng/mL (ref <0.03)

## 2016-08-05 MED ORDER — BENZONATATE 100 MG PO CAPS: 100.0000 mg | ORAL_CAPSULE | Freq: Four times a day (QID) | ORAL | 0 refills | Status: AC | PRN

## 2016-08-05 NOTE — Discharge Instructions (Signed)
Please seek medical attention for any high fevers, chest pain, shortness of breath, change in behavior, persistent vomiting, bloody stool or any other new or concerning symptoms.  

## 2016-08-05 NOTE — ED Triage Notes (Signed)
Pt comes into the ED via POV c/o chest pain that has gotten worse with his flu like symptoms.  MD at [facility] sent him over to have a cardiac assessment done.  Patient able to ambulate to the triage room with no difficulty.  States he has shortness of breath and dizziness associated with the chest pain and flu symptoms.

## 2016-08-05 NOTE — ED Provider Notes (Signed)
[facility] Emergency Department Provider Note   ____________________________________________   I have reviewed the triage vital signs and the nursing notes.   HISTORY  Chief Complaint Chest Pain   History limited by: Not Limited   HPI Timothy Galvan is a 61 y.o. male who presents to the emergency department today from primary care because of concerns for chest pain in the setting of influenza. Patient states that he was told he had influenza today. He states for the past for 5 days however he has been having cough and shortness of breath. He states that this is accompanied by some pain on the left side of his chest. It is worse with coughing. He also feels like he has had chills at night.    Past Medical History:  Diagnosis Date  . Blood clot in vein   . Heart disease     There are no active problems to display for this patient.   Past Surgical History:  Procedure Laterality Date  . MANDIBLE SURGERY    . WRIST SURGERY      Prior to Admission medications   Medication Sig Start Date End Date Taking? Authorizing Provider  famotidine (PEPCID) 20 MG tablet Take 1 tablet (20 mg total) by mouth 2 (two) times daily. 01/09/16   Carrie Mew, MD  sucralfate (CARAFATE) 1 g tablet Take 1 tablet (1 g total) by mouth 4 (four) times daily. 01/09/16   Carrie Mew, MD    Allergies Patient has no known allergies.  No family history on file.  Social History Social History  Substance Use Topics  . Smoking status: Current Every Day Smoker  . Smokeless tobacco: Never Used  . Alcohol use 7.2 oz/week    12 Cans of beer per week    Review of Systems  Constitutional: Positive for chills. Cardiovascular: Positive for chest pain. Respiratory: Positive for shortness of breath and cough. Gastrointestinal: Negative for abdominal pain, vomiting and diarrhea. Neurological: Negative for headaches, focal weakness or numbness.  10-point ROS otherwise  negative.  ____________________________________________   PHYSICAL EXAM:  VITAL SIGNS: ED Triage Vitals [08/05/16 1833]  Enc Vitals Group     BP (!) 116/99     Pulse Rate 96     Resp 18     Temp 98 F (36.7 C)     Temp Source Oral     SpO2 98 %     Weight 136 lb (61.7 kg)     Height 5\' 3"  (1.6 m)     Head Circumference      Peak Flow      Pain Score 4    Constitutional: Alert and oriented. Well appearing and in no distress. Eyes: Conjunctivae are normal. Normal extraocular movements. ENT   Head: Normocephalic and atraumatic.   Nose: No congestion/rhinnorhea.   Mouth/Throat: Mucous membranes are moist.   Neck: No stridor. Hematological/Lymphatic/Immunilogical: No cervical lymphadenopathy. Cardiovascular: Normal rate, regular rhythm.  No murmurs, rubs, or gallops. Respiratory: Normal respiratory effort without tachypnea nor retractions. Breath sounds are clear and equal bilaterally. No wheezes/rales/rhonchi. Gastrointestinal: Soft and non tender. No rebound. No guarding.  Genitourinary: Deferred Musculoskeletal: Normal range of motion in all extremities. No lower extremity edema. Neurologic:  Normal speech and language. No gross focal neurologic deficits are appreciated.  Skin:  Skin is warm, dry and intact. No rash noted. Psychiatric: Mood and affect are normal. Speech and behavior are normal. Patient exhibits appropriate insight and judgment.  ____________________________________________    LABS (pertinent positives/negatives)  Labs Reviewed  BASIC METABOLIC PANEL - Abnormal; Notable for the following:       Result Value   Glucose, Bld 121 (*)    All other components within normal limits  CBC  TROPONIN I     ____________________________________________   EKG  I, Nance Pear, attending physician, personally viewed and interpreted this EKG  EKG Time: 1838 Rate: 94 Rhythm: sinus rhythm with pac Axis: rightward axis Intervals: qtc  452 QRS: narrow, LVH ST changes: no st eelvation Impression: abnormal ekg   ____________________________________________    RADIOLOGY  CXR IMPRESSION: Mild peribronchial thickening noted. Bulla at the right lung apex, with underlying emphysematous change. Lungs otherwise grossly clear. _________________   PROCEDURES  Procedures  ____________________________________________   INITIAL IMPRESSION / ASSESSMENT AND PLAN / ED COURSE  Pertinent labs & imaging results that were available during my care of the patient were reviewed by me and considered in my medical decision making (see chart for details).  Patient presents to the emergency department today with concerns for pain in the setting of influenza. Patient states pain is been present for the past 4-5 days. Troponin was negative. Chest x-ray without any overlying pneumonia. At this point the pain likely secondary to influenza. I doubt ACS given negative troponin and length of time of symptoms. Will plan on discharging home. Patient will be given prescription for Tessalon Perles. Already received prescription for an inhaler and Tamiflu by primary care.  ____________________________________________   FINAL CLINICAL IMPRESSION(S) / ED DIAGNOSES  Final diagnoses:  Flu-like symptoms     Note: This dictation was prepared with Dragon dictation. Any transcriptional errors that result from this process are unintentional     Nance Pear, MD 08/05/16 2044

## 2017-03-17 ENCOUNTER — Emergency Department: Payer: Medicare Other

## 2017-03-17 ENCOUNTER — Emergency Department
Admission: EM | Admit: 2017-03-17 | Discharge: 2017-03-17 | Disposition: A | Discharge status: Home / Self Care | Payer: Medicare Other | Attending: Emergency Medicine | Admitting: Emergency Medicine

## 2017-03-17 ENCOUNTER — Encounter: Payer: Self-pay | Admitting: Emergency Medicine

## 2017-03-17 DIAGNOSIS — J42 Unspecified chronic bronchitis: Secondary | ICD-10-CM | POA: Diagnosis not present

## 2017-03-17 DIAGNOSIS — F17210 Nicotine dependence, cigarettes, uncomplicated: Secondary | ICD-10-CM | POA: Diagnosis not present

## 2017-03-17 DIAGNOSIS — J189 Pneumonia, unspecified organism: Secondary | ICD-10-CM | POA: Insufficient documentation

## 2017-03-17 DIAGNOSIS — R05 Cough: Secondary | ICD-10-CM | POA: Diagnosis present

## 2017-03-17 DIAGNOSIS — Z79899 Other long term (current) drug therapy: Secondary | ICD-10-CM | POA: Diagnosis not present

## 2017-03-17 LAB — CBC
HCT: 45.5 % (ref 40.0–52.0)
Hemoglobin: 15.8 g/dL (ref 13.0–18.0)
MCH: 31.9 pg (ref 26.0–34.0)
MCHC: 34.7 g/dL (ref 32.0–36.0)
MCV: 92 fL (ref 80.0–100.0)
PLATELETS: 246 10*3/uL (ref 150–440)
RBC: 4.94 MIL/uL (ref 4.40–5.90)
RDW: 13.8 % (ref 11.5–14.5)
WBC: 6.3 10*3/uL (ref 3.8–10.6)

## 2017-03-17 LAB — BASIC METABOLIC PANEL
Anion gap: 7 (ref 5–15)
BUN: 19 mg/dL (ref 6–20)
CALCIUM: 9.1 mg/dL (ref 8.9–10.3)
CO2: 23 mmol/L (ref 22–32)
CREATININE: 1.25 mg/dL — AB (ref 0.61–1.24)
Chloride: 110 mmol/L (ref 101–111)
Glucose, Bld: 112 mg/dL — ABNORMAL HIGH (ref 65–99)
Potassium: 4.3 mmol/L (ref 3.5–5.1)
Sodium: 140 mmol/L (ref 135–145)

## 2017-03-17 LAB — TROPONIN I

## 2017-03-17 MED ORDER — AZITHROMYCIN 250 MG PO TABS: ORAL_TABLET | ORAL | 0 refills | Status: AC

## 2017-03-17 MED ORDER — HYDROCOD POLST-CPM POLST ER 10-8 MG/5ML PO SUER
5.0000 mL | Freq: Once | ORAL | Status: AC
  Administered 2017-03-17: 5 mL via ORAL
  Filled 2017-03-17: qty 5

## 2017-03-17 MED ORDER — PREDNISONE 20 MG PO TABS
60.0000 mg | ORAL_TABLET | Freq: Once | ORAL | Status: AC
  Administered 2017-03-17: 60 mg via ORAL
  Filled 2017-03-17: qty 3

## 2017-03-17 MED ORDER — AZITHROMYCIN 500 MG PO TABS
500.0000 mg | ORAL_TABLET | Freq: Once | ORAL | Status: AC
  Administered 2017-03-17: 500 mg via ORAL
  Filled 2017-03-17: qty 1

## 2017-03-17 MED ORDER — PREDNISONE 20 MG PO TABS: 60.0000 mg | ORAL_TABLET | Freq: Every day | ORAL | 0 refills | Status: AC

## 2017-03-17 MED ORDER — HYDROCOD POLST-CPM POLST ER 10-8 MG/5ML PO SUER: 5.0000 mL | Freq: Two times a day (BID) | ORAL | 0 refills | Status: DC | PRN

## 2017-03-17 MED ORDER — ALBUTEROL SULFATE HFA 108 (90 BASE) MCG/ACT IN AERS: 2.0000 | INHALATION_SPRAY | Freq: Four times a day (QID) | RESPIRATORY_TRACT | 2 refills | Status: DC | PRN

## 2017-03-17 NOTE — ED Notes (Signed)
ED Provider at bedside. 

## 2017-03-17 NOTE — ED Triage Notes (Signed)
Patient to ER for c/o chest pain and "aches all over". Patient denies known fever, but reports productive cough (unable to describe phlegm). Patient states left ribs hurt worse than right with cough.

## 2017-03-17 NOTE — ED Provider Notes (Signed)
[facility] Emergency Department Provider Note   First MD Initiated Contact with Patient 03/17/17 0425     (approximate)  I have reviewed the triage vital signs and the nursing notes.   HISTORY  Chief Complaint Chest Pain and Cough    HPI Timothy Galvan is a 61 y.o. male presents to the emergency department with productive cough and left-sided chest discomfort chills subjective fevers 3 days. Patient denies any auditory pain or swelling. Patient does admit to smoking 1 pack cigarettes per day.  Past Medical History:  Diagnosis Date  . Blood clot in vein   . Heart disease     There are no active problems to display for this patient.   Past Surgical History:  Procedure Laterality Date  . MANDIBLE SURGERY    . WRIST SURGERY      Prior to Admission medications   Medication Sig Start Date End Date Taking? Authorizing Provider  benzonatate (TESSALON PERLES) 100 MG capsule Take 1 capsule (100 mg total) by mouth every 6 (six) hours as needed for cough. 08/05/16 08/05/17  Nance Pear, MD  famotidine (PEPCID) 20 MG tablet Take 1 tablet (20 mg total) by mouth 2 (two) times daily. 01/09/16   Carrie Mew, MD  sucralfate (CARAFATE) 1 g tablet Take 1 tablet (1 g total) by mouth 4 (four) times daily. 01/09/16   Carrie Mew, MD    Allergies Penicillins  No family history on file.  Social History Social History  Substance Use Topics  . Smoking status: Current Every Day Smoker  . Smokeless tobacco: Never Used  . Alcohol use 7.2 oz/week    12 Cans of beer per week    Review of Systems Constitutional: No fever/chills Eyes: No visual changes. ENT: No sore throat. Cardiovascular: Denies chest pain. Respiratory: Denies shortness of breath. Gastrointestinal: No abdominal pain.  No nausea, no vomiting.  No diarrhea.  No constipation. Genitourinary: Negative for dysuria. Musculoskeletal: Negative for neck pain.  Negative for back  pain. Integumentary: Negative for rash. Neurological: Negative for headaches, focal weakness or numbness.  ____________________________________________   PHYSICAL EXAM:  VITAL SIGNS: ED Triage Vitals [03/17/17 0311]  Enc Vitals Group     BP 137/73     Pulse Rate 67     Resp 20     Temp 98.3 F (36.8 C)     Temp Source Oral     SpO2 98 %     Weight 54.4 kg (120 lb)     Height 1.6 m (5\' 3" )     Head Circumference      Peak Flow      Pain Score 10     Pain Loc      Pain Edu?      Excl. in [place]?     Constitutional: Alert and oriented. Well appearing and in no acute distress. Eyes: Conjunctivae are normal.  Head: Atraumatic. Mouth/Throat: Mucous membranes are moist.  Oropharynx non-erythematous. Neck: No stridor.   Cardiovascular: Normal rate, regular rhythm. Good peripheral circulation. Grossly normal heart sounds. Respiratory: Normal respiratory effort.  No retractions. Left lower lobe rhonchi Gastrointestinal: Soft and nontender. No distention.  Musculoskeletal: No lower extremity tenderness nor edema. No gross deformities of extremities. Neurologic:  Normal speech and language. No gross focal neurologic deficits are appreciated.  Skin:  Skin is warm, dry and intact. No rash noted. Psychiatric: Mood and affect are normal. Speech and behavior are normal.  ____________________________________________   LABS (all labs ordered are listed,  but only abnormal results are displayed)  Labs Reviewed  BASIC METABOLIC PANEL - Abnormal; Notable for the following:       Result Value   Glucose, Bld 112 (*)    Creatinine, Ser 1.25 (*)    All other components within normal limits  CBC  TROPONIN I   ____________________________________________  EKG  ED ECG REPORT I, Dora N BROWN, the attending physician, personally viewed and interpreted this ECG.   Date: 03/17/2017  EKG Time: 3:01 AM  Rate: 78  Rhythm: Normal sinus rhythm with left ventricular hypertrophy  Axis:  Normal  Intervals: Normal  ST&T Change: None  ____________________________________________  RADIOLOGY I, [place] N BROWN, personally viewed and evaluated these images (plain radiographs) as part of my medical decision making, as well as reviewing the written report by the radiologist.  Dg Chest 2 View  Result Date: 03/17/2017 CLINICAL DATA:  Chest pain.  Diffuse body aches. EXAM: CHEST  2 VIEW COMPARISON:  Radiographs 08/05/2016, chest CT 01/09/2016 FINDINGS: The lungs are hyperinflated with emphysema. Right apical bulla is unchanged. Chronic bronchial thickening. No consolidation, pleural fluid or pneumothorax. Normal heart size and mediastinal contours with aortic arch atherosclerosis. No acute osseous abnormality. IMPRESSION: 1. Bullous emphysema with chronic bronchial thickening. 2. No acute abnormality. 3.  Aortic Atherosclerosis (ICD10-I70.0). Electronically Signed   By: Jeb Levering M.D.   On: 03/17/2017 03:51   Dg Lumbar Spine 2-3 Views  Result Date: 03/17/2017 CLINICAL DATA:  Lumbosacral back pain leading to difficulty walking. EXAM: LUMBAR SPINE - 2-3 VIEW COMPARISON:  Radiograph 10/28/2010 FINDINGS: The alignment is maintained, chronic straightening of normal lordosis. Vertebral body heights are normal. There is no listhesis. The posterior elements are intact. Endplate spurring at U2-P5, L4-L5, and L5-S1 with preservation of disc spaces. No fracture. Sacroiliac joints are symmetric and normal. Aortic atherosclerosis. IMPRESSION: Mild spondylosis without acute abnormality. Slight progression of endplate spurring from most recent comparison 2012 exam. Electronically Signed   By: Jeb Levering M.D.   On: 03/17/2017 03:53     Procedures   ____________________________________________   INITIAL IMPRESSION / ASSESSMENT AND PLAN / ED COURSE  Pertinent labs & imaging results that were available during my care of the patient were reviewed by me and considered in my medical decision  making (see chart for details).  61 year old male presenting with above stated history and physical exam concern for possible acute on chronic bronchitis versus pneumonia given finding of left lower lobe rhonchi in conjunction with chills at home. Patient will be given azithromycin and Tussionex and prednisone in the emergency department will be prescribed same for home as well as albuterol inhaler.      ____________________________________________  FINAL CLINICAL IMPRESSION(S) / ED DIAGNOSES  Final diagnoses:  Chronic bronchitis, unspecified chronic bronchitis type ([place])  Community acquired pneumonia of left lung, unspecified part of lung     MEDICATIONS GIVEN DURING THIS VISIT:  Medications  chlorpheniramine-HYDROcodone (TUSSIONEX) 10-8 MG/5ML suspension 5 mL (not administered)  azithromycin (ZITHROMAX) tablet 500 mg (500 mg Oral Given 03/17/17 0522)  predniSONE (DELTASONE) tablet 60 mg (60 mg Oral Given 03/17/17 0522)     NEW OUTPATIENT MEDICATIONS STARTED DURING THIS VISIT:  New Prescriptions   No medications on file    Modified Medications   No medications on file    Discontinued Medications   No medications on file     Note:  This document was prepared using Dragon voice recognition software and may include unintentional dictation errors.    Marjean Donna  Delane Ginger, MD 03/17/17 [id number]

## 2017-03-17 NOTE — ED Notes (Signed)
Patient transported to X-ray 

## 2017-03-17 NOTE — ED Notes (Signed)
Patient also c/o severe lower back pain now and states "that's the main reason I came over here". Xray added for Lspine.

## 2017-04-01 ENCOUNTER — Emergency Department
Admission: EM | Admit: 2017-04-01 | Discharge: 2017-04-01 | Disposition: A | Discharge status: Home / Self Care | Payer: Medicare Other | Attending: Student in an Organized Health Care Education/Training Program | Admitting: Student in an Organized Health Care Education/Training Program

## 2017-04-01 ENCOUNTER — Emergency Department: Payer: Medicare Other

## 2017-04-01 ENCOUNTER — Encounter: Payer: Self-pay | Admitting: Emergency Medicine

## 2017-04-01 DIAGNOSIS — W01190D Fall on same level from slipping, tripping and stumbling with subsequent striking against furniture, subsequent encounter: Secondary | ICD-10-CM | POA: Insufficient documentation

## 2017-04-01 DIAGNOSIS — I2510 Atherosclerotic heart disease of native coronary artery without angina pectoris: Secondary | ICD-10-CM | POA: Insufficient documentation

## 2017-04-01 DIAGNOSIS — R079 Chest pain, unspecified: Secondary | ICD-10-CM | POA: Insufficient documentation

## 2017-04-01 DIAGNOSIS — R1012 Left upper quadrant pain: Secondary | ICD-10-CM | POA: Insufficient documentation

## 2017-04-01 DIAGNOSIS — F17200 Nicotine dependence, unspecified, uncomplicated: Secondary | ICD-10-CM | POA: Diagnosis not present

## 2017-04-01 DIAGNOSIS — S2242XD Multiple fractures of ribs, left side, subsequent encounter for fracture with routine healing: Secondary | ICD-10-CM | POA: Diagnosis not present

## 2017-04-01 DIAGNOSIS — Z79899 Other long term (current) drug therapy: Secondary | ICD-10-CM | POA: Diagnosis not present

## 2017-04-01 DIAGNOSIS — R0602 Shortness of breath: Secondary | ICD-10-CM | POA: Diagnosis present

## 2017-04-01 DIAGNOSIS — Z859 Personal history of malignant neoplasm, unspecified: Secondary | ICD-10-CM | POA: Diagnosis not present

## 2017-04-01 DIAGNOSIS — S2242XA Multiple fractures of ribs, left side, initial encounter for closed fracture: Secondary | ICD-10-CM

## 2017-04-01 HISTORY — DX: Malignant (primary) neoplasm, unspecified: C80.1

## 2017-04-01 HISTORY — DX: Gastro-esophageal reflux disease without esophagitis: K21.9

## 2017-04-01 HISTORY — DX: Atherosclerotic heart disease of native coronary artery without angina pectoris: I25.10

## 2017-04-01 HISTORY — DX: Pneumonia, unspecified organism: J18.9

## 2017-04-01 LAB — CBC
HEMATOCRIT: 45.7 % (ref 40.0–52.0)
Hemoglobin: 15.4 g/dL (ref 13.0–18.0)
MCH: 31.4 pg (ref 26.0–34.0)
MCHC: 33.8 g/dL (ref 32.0–36.0)
MCV: 92.9 fL (ref 80.0–100.0)
Platelets: 245 10*3/uL (ref 150–440)
RBC: 4.92 MIL/uL (ref 4.40–5.90)
RDW: 13.9 % (ref 11.5–14.5)
WBC: 7.5 10*3/uL (ref 3.8–10.6)

## 2017-04-01 LAB — BASIC METABOLIC PANEL
Anion gap: 9 (ref 5–15)
BUN: 14 mg/dL (ref 6–20)
CHLORIDE: 110 mmol/L (ref 101–111)
CO2: 22 mmol/L (ref 22–32)
Calcium: 9.4 mg/dL (ref 8.9–10.3)
Creatinine, Ser: 1.19 mg/dL (ref 0.61–1.24)
GFR calc Af Amer: >60 mL/min (ref >60)
GFR calc non Af Amer: >60 mL/min (ref >60)
GLUCOSE: 92 mg/dL (ref 65–99)
POTASSIUM: 4.2 mmol/L (ref 3.5–5.1)
Sodium: 141 mmol/L (ref 135–145)

## 2017-04-01 LAB — TROPONIN I: Troponin I: <0.03 ng/mL (ref <0.03)

## 2017-04-01 MED ORDER — LIDOCAINE 5 % EX PTCH
1.0000 | MEDICATED_PATCH | CUTANEOUS | Status: DC
  Administered 2017-04-01: 1 via TRANSDERMAL
  Filled 2017-04-01: qty 1

## 2017-04-01 MED ORDER — IOPAMIDOL (ISOVUE-300) INJECTION 61%
100.0000 mL | Freq: Once | INTRAVENOUS | Status: AC | PRN
  Administered 2017-04-01: 100 mL via INTRAVENOUS

## 2017-04-01 MED ORDER — NAPROXEN 375 MG PO TABS: 375.0000 mg | ORAL_TABLET | Freq: Two times a day (BID) | ORAL | 0 refills | Status: AC

## 2017-04-01 MED ORDER — HYDROCODONE-ACETAMINOPHEN 5-325 MG PO TABS
1.0000 | ORAL_TABLET | Freq: Once | ORAL | Status: AC
  Administered 2017-04-01: 1 via ORAL
  Filled 2017-04-01: qty 1

## 2017-04-01 MED ORDER — LIDOCAINE 5 % EX PTCH: 1.0000 | MEDICATED_PATCH | Freq: Two times a day (BID) | CUTANEOUS | 0 refills | Status: DC

## 2017-04-01 MED ORDER — HYDROCODONE-ACETAMINOPHEN 5-325 MG PO TABS: 1.0000 | ORAL_TABLET | ORAL | 0 refills | Status: DC | PRN

## 2017-04-01 MED ORDER — IPRATROPIUM-ALBUTEROL 0.5-2.5 (3) MG/3ML IN SOLN
3.0000 mL | Freq: Once | RESPIRATORY_TRACT | Status: AC
  Administered 2017-04-01: 3 mL via RESPIRATORY_TRACT
  Filled 2017-04-01: qty 3

## 2017-04-01 NOTE — ED Triage Notes (Signed)
Patient presents to the ED with shortness of breath and left rib pain x 2 weeks.  Patient states he was seen here approx. 2 weeks ago and diagnosed with pneumonia.  Patient states he has finished taking all the pills we gave him but he is not feeling any better.  Patient reports left back pain as well as left rib pain.  Patient states, "I think something popped when I was turning to get out of the bed."  Patient is speaking in full sentences without obvious difficulty at this time.  Patient is afebrile.

## 2017-04-01 NOTE — ED Notes (Signed)
Pt presents with shortness of breath and left sided rib pain for 2weeks. Pt just dx with PNE 2 weeks ago. NAD.

## 2017-04-01 NOTE — ED Notes (Signed)
Patient transported to CT 

## 2017-04-01 NOTE — ED Provider Notes (Signed)
[facility] Emergency Department Provider Note    First MD Initiated Contact with Patient 04/01/17 1347     (approximate)  I have reviewed the triage vital signs and the nursing notes.   HISTORY  Chief Complaint Shortness of Breath and Chest Pain    HPI Timothy Galvan is a 61 y.o. male presents with shortness of breath and left rib pain for the past 2 weeks. Patient states the pain occurred after he fell against his cabinet. Was seen here 2 weeks ago and is been taking steroids as well as azithromycin and pain management without improvement in his pain. States she's feeling rib pop as well as having left upper quadrant abdominal pain. Denies any fainting spells.no fevers.  No dysuria or hematuria.   Past Medical History:  Diagnosis Date  . Blood clot in vein   . Cancer ([place])   . Coronary artery disease   . GERD (gastroesophageal reflux disease)   . Heart disease   . Pneumonia    No family history on file. Past Surgical History:  Procedure Laterality Date  . MANDIBLE SURGERY    . WRIST SURGERY     There are no active problems to display for this patient.     Prior to Admission medications   Medication Sig Start Date End Date Taking? Authorizing Provider  albuterol (PROVENTIL HFA;VENTOLIN HFA) 108 (90 Base) MCG/ACT inhaler Inhale 2 puffs into the lungs every 6 (six) hours as needed for wheezing or shortness of breath. 03/17/17  Yes Gregor Hams, MD  atorvastatin (LIPITOR) 40 MG tablet Take 40 mg by mouth daily. 01/25/17  Yes [provider]  chlorpheniramine-HYDROcodone (TUSSIONEX PENNKINETIC ER) 10-8 MG/5ML SUER Take 5 mLs by mouth every 12 (twelve) hours as needed. 03/17/17  Yes Gregor Hams, MD  COMBIVENT RESPIMAT 20-100 MCG/ACT AERS respimat Inhale 1 puff into the lungs every 6 (six) hours as needed for wheezing. 03/24/17  Yes [provider]  ibuprofen (ADVIL,MOTRIN) 600 MG tablet Take 600 mg by mouth every 6 (six)  hours as needed for pain. 03/24/17  Yes [provider]  isosorbide mononitrate (IMDUR) 60 MG 24 hr tablet Take 60 mg by mouth daily. 01/25/17  Yes [provider]  NITROSTAT 0.4 MG SL tablet Place 0.4 mg under the tongue every 5 (five) minutes x 3 doses as needed for chest pain. 01/25/17  Yes [provider]  omeprazole (PRILOSEC) 20 MG capsule Take 20 mg by mouth daily. 01/25/17  Yes [provider]  benzonatate (TESSALON PERLES) 100 MG capsule Take 1 capsule (100 mg total) by mouth every 6 (six) hours as needed for cough. Patient not taking: Reported on 04/01/2017 08/05/16 08/05/17  Nance Pear, MD  famotidine (PEPCID) 20 MG tablet Take 1 tablet (20 mg total) by mouth 2 (two) times daily. Patient not taking: Reported on 04/01/2017 01/09/16   Carrie Mew, MD  sucralfate (CARAFATE) 1 g tablet Take 1 tablet (1 g total) by mouth 4 (four) times daily. Patient not taking: Reported on 04/01/2017 01/09/16   Carrie Mew, MD    Allergies Penicillins    Social History Social History  Substance Use Topics  . Smoking status: Current Every Day Smoker  . Smokeless tobacco: Never Used  . Alcohol use 7.2 oz/week    12 Cans of beer per week    Review of Systems Patient denies headaches, rhinorrhea, blurry vision, numbness, shortness of breath, chest pain, edema, cough, abdominal pain, nausea, vomiting, diarrhea, dysuria, fevers,  rashes or hallucinations unless otherwise stated above in HPI. ____________________________________________   PHYSICAL EXAM:  VITAL SIGNS: Vitals:   04/01/17 1517 04/01/17 1535  BP: (!) 144/68 (!) 115/99  Pulse: (!) 46 (!) 55  Resp:  20  Temp:    SpO2: 98% 97%    Constitutional: Alert and oriented. Well appearing and in no acute distress. Eyes: Conjunctivae are normal.  Head: Atraumatic. Nose: No congestion/rhinnorhea. Mouth/Throat: Mucous membranes are moist.   Neck: No stridor. Painless ROM.  Cardiovascular: Normal  rate, regular rhythm. Grossly normal heart sounds.  Good peripheral circulation. Respiratory: Normal respiratory effort.  No retractions. Lungs CTAB.  Very tender to palpation of left chest wall over spleen.  No rash.   Gastrointestinal: Soft with + ttp of LUQ No distention. No abdominal bruits. No CVA tenderness. Genitourinary:  Musculoskeletal: No lower extremity tenderness nor edema.  No joint effusions. Neurologic:  Normal speech and language. No gross focal neurologic deficits are appreciated. No facial droop Skin:  Skin is warm, dry and intact. No rash noted. Psychiatric: Mood and affect are normal. Speech and behavior are normal.  ____________________________________________   LABS (all labs ordered are listed, but only abnormal results are displayed)  Results for orders placed or performed during the hospital encounter of 04/01/17 (from the past 24 hour(s))  Basic metabolic panel     Status: None   Collection Time: 04/01/17 12:36 PM  Result Value Ref Range   Sodium 141 135 - 145 mmol/L   Potassium 4.2 3.5 - 5.1 mmol/L   Chloride 110 101 - 111 mmol/L   CO2 22 22 - 32 mmol/L   Glucose, Bld 92 65 - 99 mg/dL   BUN 14 6 - 20 mg/dL   Creatinine, Ser 1.19 0.61 - 1.24 mg/dL   Calcium 9.4 8.9 - 10.3 mg/dL   GFR calc non Af Amer >60 >60 mL/min   GFR calc Af Amer >60 >60 mL/min   Anion gap 9 5 - 15  CBC     Status: None   Collection Time: 04/01/17 12:36 PM  Result Value Ref Range   WBC 7.5 3.8 - 10.6 K/uL   RBC 4.92 4.40 - 5.90 MIL/uL   Hemoglobin 15.4 13.0 - 18.0 g/dL   HCT 45.7 40.0 - 52.0 %   MCV 92.9 80.0 - 100.0 fL   MCH 31.4 26.0 - 34.0 pg   MCHC 33.8 32.0 - 36.0 g/dL   RDW 13.9 11.5 - 14.5 %   Platelets 245 150 - 440 K/uL  Troponin I     Status: None   Collection Time: 04/01/17 12:36 PM  Result Value Ref Range   Troponin I <0.03 <0.03 ng/mL   ____________________________________________  EKG My review and personal interpretation at Time: 12:45   Indication:  chest pain  Rate: 60  Rhythm: sinus Axis: normal Other: no stemi, non specific st changes, no changes from previous EKG 03/17/17 ____________________________________________  RADIOLOGY  I personally reviewed all radiographic images ordered to evaluate for the above acute complaints and reviewed radiology reports and findings.  These findings were personally discussed with the patient.  Please see medical record for radiology report.  ____________________________________________   PROCEDURES  Procedure(s) performed:  Procedures    Critical Care performed: no ____________________________________________   INITIAL IMPRESSION / ASSESSMENT AND PLAN / ED COURSE  Pertinent labs & imaging results that were available during my care of the patient were reviewed by me and considered in my medical decision making (see chart for details).  DDX: fracture, ptx, hemothorax, splenic injury, kideny injury, acs, contusion  Timothy Galvan is a 61 y.o. who presents to the ED with left chest wall pain as described above. Blood work since for the above differential is reassuring. Less consistent with ACS or dissection as the pain has been ongoing for 2 weeks after 2 medical injury. He has no hypoxia but does appear to be splinting and has point tenderness. Based on his age CT imaging ordered for the above differential shows no evidence of splenic injury or contusion but does show 3 subacute rib fractures consistent with the patient's pain. Patient will be discharged with incentive spirometer as well as oral pain medication. Patient will be appropriate for outpatient follow-up.      ____________________________________________   FINAL CLINICAL IMPRESSION(S) / ED DIAGNOSES  Final diagnoses:  Chest pain, unspecified type  Closed fracture of multiple ribs of left side, initial encounter      NEW MEDICATIONS STARTED DURING THIS VISIT:  New Prescriptions   No medications on file     Note:   This document was prepared using Dragon voice recognition software and may include unintentional dictation errors.    Merlyn Lot, MD 04/01/17 1600

## 2017-04-01 NOTE — ED Notes (Addendum)
FIRST NURSE note: per EMS pt was recently dx with pneumonia and sent home with steroids and z-pack. Pt felt "pop" in lft flank, no crep, tenderness to palp per EMS. VS stable in route

## 2018-01-10 ENCOUNTER — Other Ambulatory Visit: Payer: Self-pay

## 2018-01-10 ENCOUNTER — Encounter: Payer: Self-pay | Admitting: Emergency Medicine

## 2018-01-10 ENCOUNTER — Emergency Department: Payer: Medicare Other

## 2018-01-10 ENCOUNTER — Observation Stay
Admission: EM | Admit: 2018-01-10 | Discharge: 2018-01-13 | Disposition: A | Discharge status: Skilled Nursing Facility | Payer: Medicare Other | Attending: Internal Medicine | Admitting: Internal Medicine

## 2018-01-10 DIAGNOSIS — S32018A Other fracture of first lumbar vertebra, initial encounter for closed fracture: Secondary | ICD-10-CM | POA: Diagnosis present

## 2018-01-10 DIAGNOSIS — Z880 Allergy status to penicillin: Secondary | ICD-10-CM | POA: Diagnosis not present

## 2018-01-10 DIAGNOSIS — Z79899 Other long term (current) drug therapy: Secondary | ICD-10-CM | POA: Diagnosis not present

## 2018-01-10 DIAGNOSIS — M4856XA Collapsed vertebra, not elsewhere classified, lumbar region, initial encounter for fracture: Secondary | ICD-10-CM | POA: Diagnosis not present

## 2018-01-10 DIAGNOSIS — I2510 Atherosclerotic heart disease of native coronary artery without angina pectoris: Secondary | ICD-10-CM | POA: Diagnosis not present

## 2018-01-10 DIAGNOSIS — E785 Hyperlipidemia, unspecified: Secondary | ICD-10-CM | POA: Insufficient documentation

## 2018-01-10 DIAGNOSIS — Z7982 Long term (current) use of aspirin: Secondary | ICD-10-CM | POA: Diagnosis not present

## 2018-01-10 DIAGNOSIS — I10 Essential (primary) hypertension: Secondary | ICD-10-CM | POA: Diagnosis not present

## 2018-01-10 DIAGNOSIS — R2681 Unsteadiness on feet: Secondary | ICD-10-CM | POA: Diagnosis not present

## 2018-01-10 DIAGNOSIS — R079 Chest pain, unspecified: Secondary | ICD-10-CM | POA: Diagnosis present

## 2018-01-10 DIAGNOSIS — I700 Atherosclerosis of aorta: Secondary | ICD-10-CM | POA: Insufficient documentation

## 2018-01-10 DIAGNOSIS — K219 Gastro-esophageal reflux disease without esophagitis: Secondary | ICD-10-CM | POA: Insufficient documentation

## 2018-01-10 DIAGNOSIS — F17200 Nicotine dependence, unspecified, uncomplicated: Secondary | ICD-10-CM | POA: Diagnosis not present

## 2018-01-10 DIAGNOSIS — S32010A Wedge compression fracture of first lumbar vertebra, initial encounter for closed fracture: Secondary | ICD-10-CM | POA: Diagnosis present

## 2018-01-10 LAB — CBC
HCT: 48.4 % (ref 40.0–52.0)
Hemoglobin: 16.5 g/dL (ref 13.0–18.0)
MCH: 31.7 pg (ref 26.0–34.0)
MCHC: 34.1 g/dL (ref 32.0–36.0)
MCV: 92.7 fL (ref 80.0–100.0)
Platelets: 228 K/uL (ref 150–440)
RBC: 5.22 MIL/uL (ref 4.40–5.90)
RDW: 13.9 % (ref 11.5–14.5)
WBC: 8.3 K/uL (ref 3.8–10.6)

## 2018-01-10 LAB — BASIC METABOLIC PANEL
ANION GAP: 9 (ref 5–15)
BUN: 13 mg/dL (ref 8–23)
CHLORIDE: 108 mmol/L (ref 98–111)
CO2: 24 mmol/L (ref 22–32)
Calcium: 9.1 mg/dL (ref 8.9–10.3)
Creatinine, Ser: 1.05 mg/dL (ref 0.61–1.24)
GFR calc non Af Amer: >60 mL/min (ref >60)
Glucose, Bld: 107 mg/dL — ABNORMAL HIGH (ref 70–99)
Potassium: 4.2 mmol/L (ref 3.5–5.1)
SODIUM: 141 mmol/L (ref 135–145)

## 2018-01-10 LAB — TROPONIN I

## 2018-01-10 MED ORDER — ASPIRIN EC 81 MG PO TBEC
81.0000 mg | DELAYED_RELEASE_TABLET | Freq: Every day | ORAL | Status: DC
  Administered 2018-01-10 – 2018-01-12 (×3): 81 mg via ORAL
  Filled 2018-01-10 (×3): qty 1

## 2018-01-10 MED ORDER — ONDANSETRON HCL 4 MG/2ML IJ SOLN
4.0000 mg | Freq: Once | INTRAMUSCULAR | Status: AC
  Administered 2018-01-10: 4 mg via INTRAVENOUS
  Filled 2018-01-10: qty 2

## 2018-01-10 MED ORDER — MORPHINE SULFATE (PF) 2 MG/ML IV SOLN
2.0000 mg | Freq: Once | INTRAVENOUS | Status: AC
  Administered 2018-01-10: 2 mg via INTRAVENOUS
  Filled 2018-01-10: qty 1

## 2018-01-10 MED ORDER — OXYCODONE HCL 5 MG PO TABS
5.0000 mg | ORAL_TABLET | ORAL | Status: DC | PRN
  Administered 2018-01-10 – 2018-01-12 (×7): 5 mg via ORAL
  Filled 2018-01-10 (×7): qty 1

## 2018-01-10 MED ORDER — ENOXAPARIN SODIUM 40 MG/0.4ML ~~LOC~~ SOLN
40.0000 mg | SUBCUTANEOUS | Status: DC
  Administered 2018-01-10 – 2018-01-12 (×3): 40 mg via SUBCUTANEOUS
  Filled 2018-01-10 (×3): qty 0.4

## 2018-01-10 MED ORDER — ACETAMINOPHEN 650 MG RE SUPP: 650.0000 mg | Freq: Four times a day (QID) | RECTAL | Status: DC | PRN

## 2018-01-10 MED ORDER — MORPHINE SULFATE (PF) 4 MG/ML IV SOLN
4.0000 mg | Freq: Once | INTRAVENOUS | Status: AC
  Administered 2018-01-10: 4 mg via INTRAVENOUS
  Filled 2018-01-10: qty 1

## 2018-01-10 MED ORDER — IPRATROPIUM-ALBUTEROL 0.5-2.5 (3) MG/3ML IN SOLN: 3.0000 mL | Freq: Four times a day (QID) | RESPIRATORY_TRACT | Status: DC | PRN

## 2018-01-10 MED ORDER — MORPHINE SULFATE (PF) 2 MG/ML IV SOLN
2.0000 mg | INTRAVENOUS | Status: DC | PRN
  Administered 2018-01-11 – 2018-01-12 (×5): 2 mg via INTRAVENOUS
  Filled 2018-01-10 (×6): qty 1

## 2018-01-10 MED ORDER — ISOSORBIDE MONONITRATE ER 30 MG PO TB24
60.0000 mg | ORAL_TABLET | Freq: Every day | ORAL | Status: DC
  Administered 2018-01-10 – 2018-01-12 (×3): 60 mg via ORAL
  Filled 2018-01-10 (×3): qty 2

## 2018-01-10 MED ORDER — SODIUM CHLORIDE 0.9 % IV BOLUS
500.0000 mL | Freq: Once | INTRAVENOUS | Status: AC
  Administered 2018-01-10: 500 mL via INTRAVENOUS

## 2018-01-10 MED ORDER — ONDANSETRON HCL 4 MG/2ML IJ SOLN
4.0000 mg | Freq: Four times a day (QID) | INTRAMUSCULAR | Status: DC | PRN
  Filled 2018-01-10: qty 2

## 2018-01-10 MED ORDER — IOHEXOL 350 MG/ML SOLN
75.0000 mL | Freq: Once | INTRAVENOUS | Status: AC | PRN
  Administered 2018-01-10: 75 mL via INTRAVENOUS

## 2018-01-10 MED ORDER — ACETAMINOPHEN 325 MG PO TABS
650.0000 mg | ORAL_TABLET | Freq: Four times a day (QID) | ORAL | Status: DC | PRN
  Administered 2018-01-12 – 2018-01-13 (×2): 650 mg via ORAL
  Filled 2018-01-10 (×2): qty 2

## 2018-01-10 MED ORDER — ATORVASTATIN CALCIUM 20 MG PO TABS
40.0000 mg | ORAL_TABLET | Freq: Every day | ORAL | Status: DC
  Administered 2018-01-10 – 2018-01-12 (×3): 40 mg via ORAL
  Filled 2018-01-10 (×3): qty 2

## 2018-01-10 MED ORDER — PANTOPRAZOLE SODIUM 40 MG PO TBEC
40.0000 mg | DELAYED_RELEASE_TABLET | Freq: Every day | ORAL | Status: DC
  Administered 2018-01-11 – 2018-01-13 (×3): 40 mg via ORAL
  Filled 2018-01-10 (×3): qty 1

## 2018-01-10 MED ORDER — ONDANSETRON HCL 4 MG PO TABS: 4.0000 mg | ORAL_TABLET | Freq: Four times a day (QID) | ORAL | Status: DC | PRN

## 2018-01-10 NOTE — ED Provider Notes (Signed)
[facility] Emergency Department Provider Note ____________________________________________   First MD Initiated Contact with Patient 01/10/18 1741     (approximate)  I have reviewed the triage vital signs and the nursing notes.   HISTORY  Chief Complaint Fall and Chest Pain  HPI Timothy Galvan is a 62 y.o. male with a history of coronary artery disease who takes nitroglycerin as needed was presented to the emergency department with pressure-like chest pain which he describes as 9 out of 10 across the front of his chest which radiates through to his back when he takes a deep breath.  He states that his issue started this past Friday when he fell, tripping, when he got out of a car.  He states that he fell onto his buttocks and low back and is now having pain to the low back that he also rates as a 9 out of 10 and cramping.  He denies any loss of bowel or bladder continence.  Denies any weakness or numbness to the bilateral lower extremities.  He says the pain is radiating up to his chest.  Denies any nausea or vomiting.  Denies hitting his head or neck or any pain in his head and his neck.  He says that he also snorted cocaine this past Thursday.  Says that he also had a 65 yesterday which is when the chest pain began.  He says that the chest pain had not been instigated when the fall initially happened.   Past Medical History:  Diagnosis Date  . Blood clot in vein   . Cancer ([place])   . Coronary artery disease   . GERD (gastroesophageal reflux disease)   . Heart disease   . Pneumonia     There are no active problems to display for this patient.   Past Surgical History:  Procedure Laterality Date  . MANDIBLE SURGERY    . WRIST SURGERY      Prior to Admission medications   Medication Sig Start Date End Date Taking? Authorizing Provider  albuterol (PROVENTIL HFA;VENTOLIN HFA) 108 (90 Base) MCG/ACT inhaler Inhale 2 puffs into the lungs every 6 (six) hours  as needed for wheezing or shortness of breath. 03/17/17   Gregor Hams, MD  atorvastatin (LIPITOR) 40 MG tablet Take 40 mg by mouth daily. 01/25/17   [provider]  chlorpheniramine-HYDROcodone (TUSSIONEX PENNKINETIC ER) 10-8 MG/5ML SUER Take 5 mLs by mouth every 12 (twelve) hours as needed. 03/17/17   Gregor Hams, MD  COMBIVENT RESPIMAT 20-100 MCG/ACT AERS respimat Inhale 1 puff into the lungs every 6 (six) hours as needed for wheezing. 03/24/17   [provider]  famotidine (PEPCID) 20 MG tablet Take 1 tablet (20 mg total) by mouth 2 (two) times daily. Patient not taking: Reported on 04/01/2017 01/09/16   Carrie Mew, MD  HYDROcodone-acetaminophen [facility]) 5-325 MG tablet Take 1 tablet by mouth every 4 (four) hours as needed for moderate pain. 04/01/17   Merlyn Lot, MD  ibuprofen (ADVIL,MOTRIN) 600 MG tablet Take 600 mg by mouth every 6 (six) hours as needed for pain. 03/24/17   [provider]  isosorbide mononitrate (IMDUR) 60 MG 24 hr tablet Take 60 mg by mouth daily. 01/25/17   [provider]  lidocaine (LIDODERM) 5 % Place 1 patch onto the skin every 12 (twelve) hours. Remove & Discard patch within 12 hours or as directed by MD 04/01/17 04/01/18  Merlyn Lot, MD  NITROSTAT 0.4 MG SL tablet Place 0.4  mg under the tongue every 5 (five) minutes x 3 doses as needed for chest pain. 01/25/17   [provider]  omeprazole (PRILOSEC) 20 MG capsule Take 20 mg by mouth daily. 01/25/17   [provider]  sucralfate (CARAFATE) 1 g tablet Take 1 tablet (1 g total) by mouth 4 (four) times daily. Patient not taking: Reported on 04/01/2017 01/09/16   Carrie Mew, MD    Allergies Penicillins  History reviewed. No pertinent family history.  Social History Social History   Tobacco Use  . Smoking status: Current Every Day Smoker  . Smokeless tobacco: Never Used  Substance Use Topics  . Alcohol use: Yes    Alcohol/week: 7.2 oz      Types: 12 Cans of beer per week  . Drug use: Not on file    Review of Systems  Constitutional: No fever/chills Eyes: No visual changes. ENT: No sore throat. Cardiovascular: As above Respiratory: As above Gastrointestinal: No abdominal pain.  No nausea, no vomiting.  No diarrhea.  No constipation. Genitourinary: Negative for dysuria. Musculoskeletal: As above  skin: Negative for rash. Neurological: Negative for headaches, focal weakness or numbness.   ____________________________________________   PHYSICAL EXAM:  VITAL SIGNS: ED Triage Vitals  Enc Vitals Group     BP 01/10/18 1733 119/69     Pulse Rate 01/10/18 1733 84     Resp 01/10/18 1733 (!) 26     Temp 01/10/18 1733 98 F (36.7 C)     Temp Source 01/10/18 1733 Oral     SpO2 01/10/18 1733 94 %     Weight 01/10/18 1734 115 lb (52.2 kg)     Height 01/10/18 1734 5\' 3"  (1.6 m)     Head Circumference --      Peak Flow --      Pain Score 01/10/18 1735 10     Pain Loc --      Pain Edu? --      Excl. in [place]? --     Constitutional: Alert and oriented.  Appears uncomfortable. Eyes: Conjunctivae are normal.  Head: Atraumatic. Nose: No congestion/rhinnorhea. Mouth/Throat: Mucous membranes are moist.  Neck: No stridor.   Cardiovascular: Normal rate, regular rhythm. Grossly normal heart sounds.  Good peripheral circulation with equal and bilateral radial as well as dorsalis pedis pulses.  Chest pain is reproducible to palpation across the anterior as well as left lateral chest.  However, there is no crepitus or deformity. Respiratory: Normal respiratory effort.  No retractions. Lungs CTAB. Gastrointestinal: Soft and nontender. No distention.  Musculoskeletal: No lower extremity tenderness nor edema.  No joint effusions.  5 out of 5 strength to bilateral lower extreme knees.  No saddle anesthesia.  Tenderness to palpation to the midline lumbar spine as well as the sacrum.  No midline deformity or step-off.  Neurologic:   Normal speech and language. No gross focal neurologic deficits are appreciated. Skin:  Skin is warm, dry and intact. No rash noted. Psychiatric: Mood and affect are normal. Speech and behavior are normal.  ____________________________________________   LABS (all labs ordered are listed, but only abnormal results are displayed)  Labs Reviewed  BASIC METABOLIC PANEL - Abnormal; Notable for the following components:      Result Value   Glucose, Bld 107 (*)    All other components within normal limits  CBC  TROPONIN I   ____________________________________________  EKG  ED ECG REPORT I, Doran Stabler, the attending physician, personally viewed and interpreted this ECG.  Date: 01/10/2018  EKG Time: 1739  Rate: 83  Rhythm: normal sinus rhythm  Axis: Normal  Intervals:none  ST&T Change: No ST segment elevation or depression.  Biphasic T waves in V5 as well as inversion in V6. No significant change from previous on the record specifically of August 05, 2016. ____________________________________________  RADIOLOGY  CTA negative for pulmonary embolus or other acute abnormality.  CT lumbar spine with L1 compression fracture without retropulsion.  Also incidental finding of ectatic abdominal aorta. ____________________________________________   PROCEDURES  Procedure(s) performed:   Procedures  Critical Care performed:   ____________________________________________   INITIAL IMPRESSION / ASSESSMENT AND PLAN / ED COURSE  Pertinent labs & imaging results that were available during my care of the patient were reviewed by me and considered in my medical decision making (see chart for details).  Differential diagnosis includes, but is not limited to, ACS, aortic dissection, pulmonary embolism, cardiac tamponade, pneumothorax, pneumonia, pericarditis, myocarditis, GI-related causes including esophagitis/gastritis, and musculoskeletal chest wall pain.   Cocaine chest pain,  lumbar fracture, lumbar contusion, chest contusion, PE As part of my medical decision making, I reviewed the following data within the [facility] Notes from prior ED visits  ----------------------------------------- 8:35 PM on 01/10/2018 -----------------------------------------  Updated patient regarding his lumbar spine imaging.  He states that he has only been able to shift from the bed to the toilet and is been having great difficulty walking.  He also reports persistent pain.  We will give another dose of morphine and admit to the hospital.  Signed out to Dr. Jannifer Franklin.  Patient with reassuring chest pain work-up.  Has previous visits for atypical chest pain.  This may be a representation of this issue.  However, will likely need further observation as an inpatient. ____________________________________________   FINAL CLINICAL IMPRESSION(S) / ED DIAGNOSES  Chest pain.  Lumbar fracture.    NEW MEDICATIONS STARTED DURING THIS VISIT:  New Prescriptions   No medications on file     Note:  This document was prepared using Dragon voice recognition software and may include unintentional dictation errors.     Orbie Pyo, MD 01/10/18 2036

## 2018-01-10 NOTE — ED Triage Notes (Signed)
Pt s/p fall on Friday where he tripped and landed on tailbone. Pt c/o back pain, chest pain, and SOB all which started post fall.

## 2018-01-10 NOTE — H&P (Signed)
[name] NAME: Timothy Galvan    MR#:  [id number]  DATE OF BIRTH:  [birth year]  DATE OF ADMISSION:  01/10/2018  PRIMARY CARE PHYSICIAN: Petra Kuba, MD   REQUESTING/REFERRING PHYSICIAN: Clearnce Hasten, MD  CHIEF COMPLAINT:   Chief Complaint  Patient presents with  . Fall  . Chest Pain    HISTORY OF PRESENT ILLNESS:  Timothy Galvan  is a 62 y.o. male who presents with fall and subsequent L1 vertebral compression fracture.  Hospitalist were called for admission and for neurosurgical consult  PAST MEDICAL HISTORY:   Past Medical History:  Diagnosis Date  . Blood clot in vein   . Cancer ([place])   . Coronary artery disease   . GERD (gastroesophageal reflux disease)   . Heart disease   . Pneumonia      PAST SURGICAL HISTORY:   Past Surgical History:  Procedure Laterality Date  . MANDIBLE SURGERY    . WRIST SURGERY       SOCIAL HISTORY:   Social History   Tobacco Use  . Smoking status: Current Every Day Smoker  . Smokeless tobacco: Never Used  Substance Use Topics  . Alcohol use: Yes    Alcohol/week: 7.2 oz    Types: 12 Cans of beer per week     FAMILY HISTORY:  Family history reviewed and is non-contributory   DRUG ALLERGIES:   Allergies  Allergen Reactions  . Penicillins     Has patient had a PCN reaction causing immediate rash, facial/tongue/throat swelling, SOB or lightheadedness with hypotension: Unknown Has patient had a PCN reaction causing severe rash involving mucus membranes or skin necrosis: Unknown Has patient had a PCN reaction that required hospitalization: Unknown Has patient had a PCN reaction occurring within the last 10 years: Unknown If all of the above answers are "NO", then may proceed with Cephalosporin use.     MEDICATIONS AT HOME:   Prior to Admission medications   Medication Sig Start Date End Date Taking? Authorizing Provider  albuterol (PROVENTIL HFA;VENTOLIN  HFA) 108 (90 Base) MCG/ACT inhaler Inhale 2 puffs into the lungs every 6 (six) hours as needed for wheezing or shortness of breath. 03/17/17  Yes Gregor Hams, MD  aspirin EC 81 MG tablet Take 81 mg by mouth at bedtime.   Yes [provider]  atorvastatin (LIPITOR) 40 MG tablet Take 40 mg by mouth at bedtime.    Yes [provider]  ibuprofen (ADVIL,MOTRIN) 600 MG tablet Take 600 mg by mouth every 6 (six) hours as needed for pain. 03/24/17  Yes [provider]  Ipratropium-Albuterol (COMBIVENT) 20-100 MCG/ACT AERS respimat Inhale 1 puff into the lungs every 6 (six) hours as needed for wheezing.   Yes [provider]  isosorbide mononitrate (IMDUR) 60 MG 24 hr tablet Take 60 mg by mouth at bedtime.    Yes [provider]  nitroGLYCERIN (NITROSTAT) 0.4 MG SL tablet Place 0.4 mg under the tongue every 5 (five) minutes as needed for chest pain.   Yes [provider]  omeprazole (PRILOSEC) 20 MG capsule Take 20 mg by mouth at bedtime.    Yes [provider]    REVIEW OF SYSTEMS:  Review of Systems  Constitutional: Negative for chills, fever, malaise/fatigue and weight loss.  HENT: Negative for ear pain, hearing loss and tinnitus.   Eyes: Negative for blurred vision, double vision, pain and redness.  Respiratory: Negative for cough, hemoptysis and shortness  of breath.   Cardiovascular: Negative for chest pain, palpitations, orthopnea and leg swelling.  Gastrointestinal: Negative for abdominal pain, constipation, diarrhea, nausea and vomiting.  Genitourinary: Negative for dysuria, frequency and hematuria.  Musculoskeletal: Positive for back pain. Negative for joint pain and neck pain.  Skin:       No acne, rash, or lesions  Neurological: Negative for dizziness, tremors, focal weakness and weakness.  Endo/Heme/Allergies: Negative for polydipsia. Does not bruise/bleed easily.  Psychiatric/Behavioral: Negative for depression. The patient  is not nervous/anxious and does not have insomnia.      VITAL SIGNS:   Vitals:   01/10/18 1734 01/10/18 1748 01/10/18 1830 01/10/18 1900  BP:  (!) 145/76 (!) 132/104 (!) 149/110  Pulse:    75  Resp:  18  15  Temp:      TempSrc:      SpO2:    97%  Weight: 52.2 kg (115 lb)     Height: 5\' 3"  (1.6 m)      Wt Readings from Last 3 Encounters:  01/10/18 52.2 kg (115 lb)  04/01/17 49.9 kg (110 lb)  03/17/17 54.4 kg (120 lb)    PHYSICAL EXAMINATION:  Physical Exam  Vitals reviewed. Constitutional: He is oriented to person, place, and time. He appears well-developed and well-nourished. No distress.  HENT:  Head: Normocephalic and atraumatic.  Mouth/Throat: Oropharynx is clear and moist.  Eyes: Pupils are equal, round, and reactive to light. Conjunctivae and EOM are normal. No scleral icterus.  Neck: Normal range of motion. Neck supple. No JVD present. No thyromegaly present.  Cardiovascular: Normal rate, regular rhythm and intact distal pulses. Exam reveals no gallop and no friction rub.  No murmur heard. Respiratory: Effort normal and breath sounds normal. No respiratory distress. He has no wheezes. He has no rales.  GI: Soft. Bowel sounds are normal. He exhibits no distension. There is no tenderness.  Musculoskeletal: Normal range of motion. He exhibits tenderness (Lumbar spine). He exhibits no edema.  No arthritis, no gout  Lymphadenopathy:    He has no cervical adenopathy.  Neurological: He is alert and oriented to person, place, and time. No cranial nerve deficit.  No dysarthria, no aphasia  Skin: Skin is warm and dry. No rash noted. No erythema.  Psychiatric: He has a normal mood and affect. His behavior is normal. Judgment and thought content normal.    LABORATORY PANEL:   CBC Recent Labs  Lab 01/10/18 1739  WBC 8.3  HGB 16.5  HCT 48.4  PLT 228    ------------------------------------------------------------------------------------------------------------------  Chemistries  Recent Labs  Lab 01/10/18 1739  NA 141  K 4.2  CL 108  CO2 24  GLUCOSE 107*  BUN 13  CREATININE 1.05  CALCIUM 9.1   ------------------------------------------------------------------------------------------------------------------  Cardiac Enzymes Recent Labs  Lab 01/10/18 1739  TROPONINI <0.03   ------------------------------------------------------------------------------------------------------------------  RADIOLOGY:  Ct Angio Chest Pe W And/or Wo Contrast  Result Date: 01/10/2018 CLINICAL DATA:  Chest pain with deep breathing and coughing. The patient has had a productive cough with chills since 01/07/2018. EXAM: CT ANGIOGRAPHY CHEST WITH CONTRAST TECHNIQUE: Multidetector CT imaging of the chest was performed using the standard protocol during bolus administration of intravenous contrast. Multiplanar CT image reconstructions and MIPs were obtained to evaluate the vascular anatomy. CONTRAST:  75 ml OMNIPAQUE IOHEXOL 350 MG/ML SOLN COMPARISON:  CT chest 04/01/2018. FINDINGS: Cardiovascular: No pulmonary embolus is identified. There is calcific aortic and coronary atherosclerosis. No pericardial effusion. Mild cardiomegaly noted. Mediastinum/Nodes: No enlarged mediastinal, hilar,  or axillary lymph nodes. Thyroid gland, trachea, and esophagus demonstrate no significant findings. Lungs/Pleura: No pleural effusion. Extensive emphysematous disease is present. Mild dependent atelectasis is noted. No consolidative process, nodule or mass. Upper Abdomen: No acute or focal abnormality. Musculoskeletal: Negative. Review of the MIP images confirms the above findings. IMPRESSION: Negative for pulmonary embolus or acute disease. Calcific coronary artery disease. Aortic atherosclerosis (ICD10-I70.0) and extensive emphysema (ICD10-J43.9). Electronically Signed   By:  Inge Rise M.D.   On: 01/10/2018 20:22   Ct Lumbar Spine Wo Contrast  Result Date: 01/10/2018 CLINICAL DATA:  Initial evaluation for acute back pain status post recent fall. EXAM: CT LUMBAR SPINE WITHOUT CONTRAST TECHNIQUE: Multidetector CT imaging of the lumbar spine was performed without intravenous contrast administration. Multiplanar CT image reconstructions were also generated. COMPARISON:  Prior CT from 04/01/2017. FINDINGS: Segmentation: Normal segmentation. Lowest well-formed disc labeled the L5-S1 level. Alignment: Mild straightening of the normal lumbar lordosis. No listhesis or malalignment. Vertebrae: Mild height loss of up to 25% at the superior endplate of L1, suspicious for acute compression fracture. This is benign/mechanical in appearance. No bony retropulsion. No other acute injury within the lumbar spine. Mild central endplate height loss at a few additional levels appears chronic in nature. Visualized sacrum and pelvis are intact. SI joints symmetric and partially ankylosed bilaterally. No discrete osseous lesions. Remotely healed fracture of the left posterior eleventh rib noted. Paraspinal and other soft tissues: Paraspinous soft tissues demonstrate no acute finding. Extensive aorto bi-iliac atherosclerotic disease. Infrarenal aorta ectatic measuring up to 2.5 cm in diameter. Visualized visceral structures otherwise unremarkable. Disc levels: L1-2:  Mild bilateral facet hypertrophy.  No stenosis. L2-3: Mild diffuse disc bulge with facet hypertrophy. No significant stenosis. L3-4: Mild disc bulge with bilateral facet hypertrophy. No stenosis. L4-5: Diffuse disc bulge, slightly asymmetric to the left. Mild bilateral facet and ligament flavum hypertrophy. No significant stenosis. L5-S1: Diffuse disc bulge, slightly asymmetric to the right. Bulging disc closely approximates the descending S1 nerve roots bilaterally, greater on the right. Mild facet hypertrophy. Mild to moderate right with  mild left L5 foraminal stenosis. IMPRESSION: 1. Acute compression fracture involving the superior endplate of L1 with up to 25% height loss without bony retropulsion. This is benign/mechanical in appearance. 2. Mild degenerative disc bulging and facet hypertrophy throughout the lumbar spine without significant spinal stenosis. Moderate right L5 foraminal narrowing. 3. **An incidental finding of potential clinical significance has been found. 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 Recommend followup by ultrasound in 5 years. This recommendation follows ACR consensus guidelines: White Paper of the ACR Incidental Findings Committee II on Vascular Findings. J Am Coll Radiol 2013; 10:789-794.** Electronically Signed   By: Jeannine Boga M.D.   On: 01/10/2018 20:28    EKG:   Orders placed or performed during the hospital encounter of 01/10/18  . EKG 12-Lead  . EKG 12-Lead  . ED EKG within 10 minutes  . ED EKG within 10 minutes    IMPRESSION AND PLAN:  Principal Problem:   Closed compression fracture of body of L1 vertebra (HCC) -patient does not have concerning neurologic symptoms, PRN analgesia, neurosurgery consult Active Problems:   CAD (coronary artery disease) -continue home meds   GERD (gastroesophageal reflux disease) -home dose PPI   HLD (hyperlipidemia) -Home dose antilipid  Chart review performed and case discussed with ED provider. Labs, imaging and/or ECG reviewed by provider and discussed with patient/family. Management plans discussed with the  patient and/or family.  DVT PROPHYLAXIS: SubQ lovenox  GI PROPHYLAXIS: PPI  ADMISSION STATUS: Observation  CODE STATUS: Full  TOTAL TIME TAKING CARE OF THIS PATIENT: 40 minutes.   Hannan Hutmacher [place] 01/10/2018, 9:04 PM  [employer]  [phone]  CC: Primary care physician; Petra Kuba, MD  Note:  This document was prepared using  Dragon voice recognition software and may include unintentional dictation errors.

## 2018-01-10 NOTE — ED Notes (Signed)
Patient reports losing balance and falling on Friday and landing on tailbone and back. States at time, he only felt pain in tailbone, but states after waking up Saturday noticed pain in lower back and chest. Patient reports pain with deep breathing and coughing. States has had cough since Friday with chills and white sputum production.

## 2018-01-11 DIAGNOSIS — M4856XA Collapsed vertebra, not elsewhere classified, lumbar region, initial encounter for fracture: Secondary | ICD-10-CM | POA: Diagnosis not present

## 2018-01-11 LAB — CBC
HCT: 40.5 % (ref 40.0–52.0)
Hemoglobin: 13.9 g/dL (ref 13.0–18.0)
MCH: 32.2 pg (ref 26.0–34.0)
MCHC: 34.3 g/dL (ref 32.0–36.0)
MCV: 93.9 fL (ref 80.0–100.0)
PLATELETS: 197 10*3/uL (ref 150–440)
RBC: 4.31 MIL/uL — ABNORMAL LOW (ref 4.40–5.90)
RDW: 14.1 % (ref 11.5–14.5)
WBC: 7.1 10*3/uL (ref 3.8–10.6)

## 2018-01-11 LAB — BASIC METABOLIC PANEL
Anion gap: 5 (ref 5–15)
BUN: 17 mg/dL (ref 8–23)
CHLORIDE: 107 mmol/L (ref 98–111)
CO2: 27 mmol/L (ref 22–32)
CREATININE: 0.98 mg/dL (ref 0.61–1.24)
Calcium: 8.5 mg/dL — ABNORMAL LOW (ref 8.9–10.3)
GFR calc Af Amer: >60 mL/min (ref >60)
GFR calc non Af Amer: >60 mL/min (ref >60)
Glucose, Bld: 93 mg/dL (ref 70–99)
Potassium: 4.1 mmol/L (ref 3.5–5.1)
SODIUM: 139 mmol/L (ref 135–145)

## 2018-01-11 NOTE — Care Management Note (Signed)
Case Management Note  Patient Details  Name: Timothy Galvan MRN: [id number] Date of Birth: Mar 16, 1956  Subjective/Objective:                  RNCM met with patient to discuss transition of care. He is independent from home. He states his neighbor Automotive engineer" is his support system however he states that he depends on patient to pay him to get out and about. He has Medications at his PCP in [facility] that he states he cannot pay $4 for. He is eager to see MD and requesting to eat- RN notified.   Home health list provided. Patient encouraged to have his nephew pay for his medications and pick them up before they close for the holiday- he agreed.  He has Medicaid.  Action/Plan:   Home health list provided.  Expected Discharge Date:  01/13/18               Expected Discharge Plan:     In-House Referral:     Discharge planning Services  CM Consult  Post Acute Care Choice:  Home Health, Durable Medical Equipment Choice offered to:  Patient  DME Arranged:    DME Agency:     HH Arranged:    [place] Agency:     Status of Service:  In process, will continue to follow  If discussed at Long Length of Stay Meetings, dates discussed:    Additional Comments:  Timothy Gallo, RN 01/11/2018, 10:00 AM

## 2018-01-11 NOTE — Consult Note (Addendum)
Referring Physician:  No referring provider defined for this encounter.  Primary Physician:  Petra Kuba, MD  Chief Complaint:  Back pain   History of Present Illness: Timothy Galvan is a 62 y.o. male who presents as a consult for L1 compression fracture. Patient states that 4 days ago he fell trying to get out of his car. Pain persisted throughout the weekend but seemed to be improving with limited movement. Yesterday, he presented to the ED for evaluation of chest pain and back pain. Imaging revealed acute vertebral compression fracture with 25% height loss without retropulsion.  Back pain currently rated 8/10 and worsened with movement and weight bearing. Denies lower extremity pain/numbness/tingling/weakness, bladder/bowel dysfunction, or saddle paresthesia.   Review of Systems:  A 10 point review of systems is negative, except for the pertinent positives and negatives detailed in the HPI.  Past Medical History: Past Medical History:  Diagnosis Date  . Blood clot in vein   . Cancer ([place])   . Coronary artery disease   . GERD (gastroesophageal reflux disease)   . Heart disease   . Pneumonia     Past Surgical History: Past Surgical History:  Procedure Laterality Date  . MANDIBLE SURGERY    . WRIST SURGERY      Allergies: Allergies as of 01/10/2018 - Review Complete 01/10/2018  Allergen Reaction Noted  . Penicillins  03/17/2017    Medications:  Current Facility-Administered Medications:  .  acetaminophen (TYLENOL) tablet 650 mg, 650 mg, Oral, Q6H PRN **OR** acetaminophen (TYLENOL) suppository 650 mg, 650 mg, Rectal, Q6H PRN, Lance Coon, MD .  aspirin EC tablet 81 mg, 81 mg, Oral, Corwin Levins, MD, 81 mg at 01/10/18 2259 .  atorvastatin (LIPITOR) tablet 40 mg, 40 mg, Oral, QHS, Lance Coon, MD, 40 mg at 01/10/18 2259 .  enoxaparin (LOVENOX) injection 40 mg, 40 mg, Subcutaneous, Q24H, Lance Coon, MD, 40 mg at 01/10/18 2300 .  ipratropium-albuterol  (DUONEB) 0.5-2.5 (3) MG/3ML nebulizer solution 3 mL, 3 mL, Inhalation, Q6H PRN, Lance Coon, MD .  isosorbide mononitrate (IMDUR) 24 hr tablet 60 mg, 60 mg, Oral, Corwin Levins, MD, 60 mg at 01/10/18 2302 .  morphine 2 MG/ML injection 2 mg, 2 mg, Intravenous, Q3H PRN, Lance Coon, MD, 2 mg at 01/11/18 0745 .  ondansetron (ZOFRAN) tablet 4 mg, 4 mg, Oral, Q6H PRN **OR** ondansetron (ZOFRAN) injection 4 mg, 4 mg, Intravenous, Q6H PRN, Lance Coon, MD .  oxyCODONE (Oxy IR/ROXICODONE) immediate release tablet 5 mg, 5 mg, Oral, Q4H PRN, Lance Coon, MD, 5 mg at 01/11/18 0926 .  pantoprazole (PROTONIX) EC tablet 40 mg, 40 mg, Oral, Daily, Lance Coon, MD, 40 mg at 01/11/18 4098   Social History: Social History   Tobacco Use  . Smoking status: Current Every Day Smoker  . Smokeless tobacco: Never Used  Substance Use Topics  . Alcohol use: Yes    Alcohol/week: 7.2 oz    Types: 12 Cans of beer per week  . Drug use: Yes    Types: Cocaine    Family Medical History: History reviewed. No pertinent family history.  Physical Examination: Vitals:   01/10/18 2320 01/11/18 0809  BP: (!) 147/91 117/66  Pulse: 68 (!) 48  Resp: 19 18  Temp:  97.7 F (36.5 C)  SpO2: 98% 97%     General: Patient is well developed, well nourished, calm, collected, and in no apparent distress.  Psychiatric: Patient is non-anxious.  Head:  Pupils equal, round, and reactive to  light.  Neck:   Supple.  Full range of motion.  Respiratory: Patient is breathing without any difficulty.  Extremities: No edema.  Skin:   On exposed skin, there are no abnormal skin lesions.  NEUROLOGICAL:  General: In no acute distress.   Awake, alert, oriented to person, place, and time.  Pupils equal round and reactive to light.  Facial tone is symmetric.   ROM of spine: Not assessed.  Palpation of spine: Tenderness midline and to the right at high lumbar spine.    Strength: Side Biceps Triceps Deltoid  Interossei Grip Wrist Ext. Wrist Flex.  R 5 5 5 5 5 5 5   L 5 5 5 5 5 5 5    Side Iliopsoas Quads Hamstring PF DF EHL  R 5 5 5 5 5 5   L 5 5 5 5 5 5    Reflexes are 1+ and symmetric at the biceps, triceps, brachioradialis, patella and achilles.   Bilateral upper and lower extremity sensation is intact to light touch and pin prick (mild decreased sensation left lateral calf).  Clonus is not present.  Toes are down-going.   Imaging: EXAM: CT LUMBAR SPINE WITHOUT CONTRAST 01/10/2018  TECHNIQUE: Multidetector CT imaging of the lumbar spine was performed without intravenous contrast administration. Multiplanar CT image reconstructions were also generated.  COMPARISON:  Prior CT from 04/01/2017.  FINDINGS: Segmentation: Normal segmentation. Lowest well-formed disc labeled the L5-S1 level.  Alignment: Mild straightening of the normal lumbar lordosis. No listhesis or malalignment.  Vertebrae: Mild height loss of up to 25% at the superior endplate of L1, suspicious for acute compression fracture. This is benign/mechanical in appearance. No bony retropulsion.  No other acute injury within the lumbar spine. Mild central endplate height loss at a few additional levels appears chronic in nature. Visualized sacrum and pelvis are intact. SI joints symmetric and partially ankylosed bilaterally. No discrete osseous lesions. Remotely healed fracture of the left posterior eleventh rib noted.  Paraspinal and other soft tissues: Paraspinous soft tissues demonstrate no acute finding. Extensive aorto bi-iliac atherosclerotic disease. Infrarenal aorta ectatic measuring up to 2.5 cm in diameter. Visualized visceral structures otherwise unremarkable.  Disc levels:  L1-2:  Mild bilateral facet hypertrophy.  No stenosis.  L2-3: Mild diffuse disc bulge with facet hypertrophy. No significant stenosis.  L3-4: Mild disc bulge with bilateral facet hypertrophy. No stenosis.  L4-5: Diffuse  disc bulge, slightly asymmetric to the left. Mild bilateral facet and ligament flavum hypertrophy. No significant stenosis.  L5-S1: Diffuse disc bulge, slightly asymmetric to the right. Bulging disc closely approximates the descending S1 nerve roots bilaterally, greater on the right. Mild facet hypertrophy. Mild to moderate right with mild left L5 foraminal stenosis.  IMPRESSION: 1. Acute compression fracture involving the superior endplate of L1 with up to 25% height loss without bony retropulsion. This is benign/mechanical in appearance. 2. Mild degenerative disc bulging and facet hypertrophy throughout the lumbar spine without significant spinal stenosis. Moderate right L5 foraminal narrowing. 3. **An incidental finding of potential clinical significance has been found. 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 Recommend followup by ultrasound in 5 years. This recommendation follows ACR consensus guidelines: White Paper of the ACR Incidental Findings Committee II on Vascular Findings. J Am Coll Radiol 2013; 10:789-794.**   Assessment and Plan: Mr. Eggenberger is a pleasant 62 y.o. male with acute back pain after falling and sustaining a  L1 compression fracture with approximately 25% height loss without retropulsion. Denies any  lower extremity radicular symptoms or any symptoms concerning for cauda equina. Surgical intervention is not necessary at this time.  Recommending TLSO brace and to follow up in clinic in approximately 4 weeks to monitor progress.   Order placed for TLSO brace  Marin Olp, PA-C Dept. of Neurosurgery

## 2018-01-11 NOTE — Care Management Obs Status (Signed)
[place] NOTIFICATION   Patient Details  Name: Timothy Galvan MRN: [id number] Date of Birth: [birth year]   Medicare Observation Status Notification Given:  Yes    Randie Tallarico, RN 01/11/2018, 8:02 AM

## 2018-01-11 NOTE — Progress Notes (Signed)
[facility] NAME: Timothy Galvan    MR#:  [id number]  DATE OF BIRTH:  12/01/1955  SUBJECTIVE:   Patient here after a fall and complaining of some back pain noted to have a L1 compression fracture.  Still having some lower back pain but no other associated symptoms.  Patient has no radicular symptoms.  REVIEW OF SYSTEMS:    Review of Systems  Constitutional: Negative for chills and fever.  HENT: Negative for congestion and tinnitus.   Eyes: Negative for blurred vision and double vision.  Respiratory: Negative for cough, shortness of breath and wheezing.   Cardiovascular: Negative for chest pain, orthopnea and PND.  Gastrointestinal: Negative for abdominal pain, diarrhea, nausea and vomiting.  Genitourinary: Negative for dysuria and hematuria.  Musculoskeletal: Positive for back pain.  Neurological: Negative for dizziness, sensory change and focal weakness.  All other systems reviewed and are negative.   Nutrition: Heart Healthy Tolerating Diet: Yes Tolerating PT: Await Eval.      DRUG ALLERGIES:   Allergies  Allergen Reactions  . Penicillins     Has patient had a PCN reaction causing immediate rash, facial/tongue/throat swelling, SOB or lightheadedness with hypotension: Unknown Has patient had a PCN reaction causing severe rash involving mucus membranes or skin necrosis: Unknown Has patient had a PCN reaction that required hospitalization: Unknown Has patient had a PCN reaction occurring within the last 10 years: Unknown If all of the above answers are "NO", then may proceed with Cephalosporin use.     VITALS:  Blood pressure 117/66, pulse (!) 48, temperature 97.7 F (36.5 C), temperature source Oral, resp. rate 18, height 5\' 3"  (1.6 m), weight 52.2 kg (115 lb), SpO2 97 %.  PHYSICAL EXAMINATION:   Physical Exam  GENERAL:  62 y.o.-year-old patient lying in bed in no acute distress.  EYES: Pupils equal, round,  reactive to light and accommodation. No scleral icterus. Extraocular muscles intact.  HEENT: Head atraumatic, normocephalic. Oropharynx and nasopharynx clear.  NECK:  Supple, no jugular venous distention. No thyroid enlargement, no tenderness.  LUNGS: Normal breath sounds bilaterally, no wheezing, rales, rhonchi. No use of accessory muscles of respiration.  CARDIOVASCULAR: S1, S2 normal. No murmurs, rubs, or gallops.  ABDOMEN: Soft, nontender, nondistended. Bowel sounds present. No organomegaly or mass.  EXTREMITIES: No cyanosis, clubbing or edema b/l.    NEUROLOGIC: Cranial nerves II through XII are intact. No focal Motor or sensory deficits b/l.   PSYCHIATRIC: The patient is alert and oriented x 3.  SKIN: No obvious rash, lesion, or ulcer.    LABORATORY PANEL:   CBC Recent Labs  Lab 01/11/18 0538  WBC 7.1  HGB 13.9  HCT 40.5  PLT 197   ------------------------------------------------------------------------------------------------------------------  Chemistries  Recent Labs  Lab 01/11/18 0538  NA 139  K 4.1  CL 107  CO2 27  GLUCOSE 93  BUN 17  CREATININE 0.98  CALCIUM 8.5*   ------------------------------------------------------------------------------------------------------------------  Cardiac Enzymes Recent Labs  Lab 01/10/18 1739  TROPONINI <0.03   ------------------------------------------------------------------------------------------------------------------  RADIOLOGY:  Ct Angio Chest Pe W And/or Wo Contrast  Result Date: 01/10/2018 CLINICAL DATA:  Chest pain with deep breathing and coughing. The patient has had a productive cough with chills since 01/07/2018. EXAM: CT ANGIOGRAPHY CHEST WITH CONTRAST TECHNIQUE: Multidetector CT imaging of the chest was performed using the standard protocol during bolus administration of intravenous contrast. Multiplanar CT image reconstructions and MIPs were obtained to evaluate the vascular anatomy. CONTRAST:  75 ml  OMNIPAQUE IOHEXOL 350 MG/ML SOLN COMPARISON:  CT chest 04/01/2018. FINDINGS: Cardiovascular: No pulmonary embolus is identified. There is calcific aortic and coronary atherosclerosis. No pericardial effusion. Mild cardiomegaly noted. Mediastinum/Nodes: No enlarged mediastinal, hilar, or axillary lymph nodes. Thyroid gland, trachea, and esophagus demonstrate no significant findings. Lungs/Pleura: No pleural effusion. Extensive emphysematous disease is present. Mild dependent atelectasis is noted. No consolidative process, nodule or mass. Upper Abdomen: No acute or focal abnormality. Musculoskeletal: Negative. Review of the MIP images confirms the above findings. IMPRESSION: Negative for pulmonary embolus or acute disease. Calcific coronary artery disease. Aortic atherosclerosis (ICD10-I70.0) and extensive emphysema (ICD10-J43.9). Electronically Signed   By: Inge Rise M.D.   On: 01/10/2018 20:22   Ct Lumbar Spine Wo Contrast  Result Date: 01/10/2018 CLINICAL DATA:  Initial evaluation for acute back pain status post recent fall. EXAM: CT LUMBAR SPINE WITHOUT CONTRAST TECHNIQUE: Multidetector CT imaging of the lumbar spine was performed without intravenous contrast administration. Multiplanar CT image reconstructions were also generated. COMPARISON:  Prior CT from 04/01/2017. FINDINGS: Segmentation: Normal segmentation. Lowest well-formed disc labeled the L5-S1 level. Alignment: Mild straightening of the normal lumbar lordosis. No listhesis or malalignment. Vertebrae: Mild height loss of up to 25% at the superior endplate of L1, suspicious for acute compression fracture. This is benign/mechanical in appearance. No bony retropulsion. No other acute injury within the lumbar spine. Mild central endplate height loss at a few additional levels appears chronic in nature. Visualized sacrum and pelvis are intact. SI joints symmetric and partially ankylosed bilaterally. No discrete osseous lesions. Remotely healed  fracture of the left posterior eleventh rib noted. Paraspinal and other soft tissues: Paraspinous soft tissues demonstrate no acute finding. Extensive aorto bi-iliac atherosclerotic disease. Infrarenal aorta ectatic measuring up to 2.5 cm in diameter. Visualized visceral structures otherwise unremarkable. Disc levels: L1-2:  Mild bilateral facet hypertrophy.  No stenosis. L2-3: Mild diffuse disc bulge with facet hypertrophy. No significant stenosis. L3-4: Mild disc bulge with bilateral facet hypertrophy. No stenosis. L4-5: Diffuse disc bulge, slightly asymmetric to the left. Mild bilateral facet and ligament flavum hypertrophy. No significant stenosis. L5-S1: Diffuse disc bulge, slightly asymmetric to the right. Bulging disc closely approximates the descending S1 nerve roots bilaterally, greater on the right. Mild facet hypertrophy. Mild to moderate right with mild left L5 foraminal stenosis. IMPRESSION: 1. Acute compression fracture involving the superior endplate of L1 with up to 25% height loss without bony retropulsion. This is benign/mechanical in appearance. 2. Mild degenerative disc bulging and facet hypertrophy throughout the lumbar spine without significant spinal stenosis. Moderate right L5 foraminal narrowing. 3. **An incidental finding of potential clinical significance has been found. 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 Recommend followup by ultrasound in 5 years. This recommendation follows ACR consensus guidelines: White Paper of the ACR Incidental Findings Committee II on Vascular Findings. J Am Coll Radiol 2013; 10:789-794.** Electronically Signed   By: Jeannine Boga M.D.   On: 01/10/2018 20:28     ASSESSMENT AND PLAN:   62 year old male with past medical history of coronary artery disease, GERD, essential hypertension, hyperlipidemia who presented to the hospital due to back pain and noted to have L1 compression  fracture.  1.  Back pain/compression fracture-patient had a fall last week and had worsening back pain and therefore presented to the hospital.  CT scan of the lumbar spine shows an L1 compression fracture. - Patient has no radicular symptoms of weakness numbness or tingling. - Seen by  neurosurgery and no plans for surgical intervention.  Patient has been placed in a brace continue supportive care with pain control/physical therapy and follow-up with them as an outpatient in next few weeks.  2.  Essential hypertension-continue Imdur  3.  Hyperlipidemia-continue atorvastatin.  4.  History of CAD-no acute chest pain.  Continue aspirin, statin, Imdur.  5.  GERD-continue Protonix.  Disposal-pending physical therapy evaluation.     All the records are reviewed and case discussed with Care Management/Social Worker. Management plans discussed with the patient, family and they are in agreement.  CODE STATUS: Full code  DVT Prophylaxis: Lovenox  TOTAL TIME TAKING CARE OF THIS PATIENT: 30 minutes.   POSSIBLE D/C IN 1-2 DAYS, DEPENDING ON CLINICAL CONDITION.   Henreitta Leber M.D on 01/11/2018 at 3:18 PM  Between 7am to 6pm - Pager - [phone]  After 6pm go to [email or web address] - Proofreader  [facility]  Office  [phone]  CC: Primary care physician; Petra Kuba, MD

## 2018-01-11 NOTE — Plan of Care (Signed)
  Problem: Education: Goal: Knowledge of General Education information will improve Outcome: Progressing   Problem: Health Behavior/Discharge Planning: Goal: Ability to manage health-related needs will improve Outcome: Progressing   Problem: Clinical Measurements: Goal: Ability to maintain clinical measurements within normal limits will improve Outcome: Progressing Goal: Will remain free from infection Outcome: Progressing Goal: Diagnostic test results will improve Outcome: Progressing Goal: Respiratory complications will improve Outcome: Progressing Goal: Cardiovascular complication will be avoided Outcome: Progressing   Problem: Activity: Goal: Risk for activity intolerance will decrease Outcome: Progressing   Problem: Nutrition: Goal: Adequate nutrition will be maintained Outcome: Progressing   Problem: Coping: Goal: Level of anxiety will decrease Outcome: Progressing   

## 2018-01-12 DIAGNOSIS — M4856XA Collapsed vertebra, not elsewhere classified, lumbar region, initial encounter for fracture: Secondary | ICD-10-CM | POA: Diagnosis not present

## 2018-01-12 LAB — HIV ANTIBODY (ROUTINE TESTING W REFLEX): HIV Screen 4th Generation wRfx: NONREACTIVE

## 2018-01-12 MED ORDER — MELOXICAM 7.5 MG PO TABS
15.0000 mg | ORAL_TABLET | Freq: Every day | ORAL | Status: DC
  Administered 2018-01-12 – 2018-01-13 (×2): 15 mg via ORAL
  Filled 2018-01-12 (×2): qty 2

## 2018-01-12 MED ORDER — ENSURE ENLIVE PO LIQD
237.0000 mL | Freq: Two times a day (BID) | ORAL | Status: DC
  Administered 2018-01-12: 237 mL via ORAL

## 2018-01-12 NOTE — Progress Notes (Signed)
Physical Therapy Treatment Patient Details Name: Timothy Galvan MRN: [id number] DOB: 1955-10-18 Today's Date: 01/12/2018    History of Present Illness pt presents to ED on 01/10/18 with complaints of back pain, chest pain, and SOB that began on 01/07/18 after a fall getiing out of car. Pt subsequently diagnosed with a closed compression fracture of the body of L1. Neuro consult states that fracture is non surgical. Pt has past medical history that includes CAD, GERD, heart disease, and cocaine use.    PT Comments    Pt states that he is doing well this afternoon. Pt states that he is in less pain ever since being fitted for TLSO. Pt demonstrates improvements from this mornings sessions however still demonstrates drastic deficits in strength and balance that limit pts mobility. Pt min assist sit<>stand from RW demonstrating knee buckling during transfer. Pt amb 40' with RW and min assist for safety. Pt educated on use of RW due to never using one before. Pt demonstrates poor safety awareness and difficulty using RW safely. Pt required min assist several times throughout amb to maintain balance and prevent falls. Pt at this time is limited not only by low back pain but also due to poor LE strength, balance, and endurance. Pt verbalized multiple times throughout session being unsure of ability to take care of himself in current condition if he d/c home. Pt could benefit from continued skilled therapy at this time to improve deficits toward PLOF. PT will continue to work with pt 7x/week while admitted. D/c recommendations continue to be SNF in part due to pts poor support network upon d/c as well as poor safety awareness increasing risk of readmission.   Follow Up Recommendations  SNF     Equipment Recommendations  Rolling walker with 5" wheels    Recommendations for Other Services       Precautions / Restrictions Precautions Precautions: None Precaution Comments: TLSO brace when up Required  Braces or Orthoses: Spinal Brace Spinal Brace: Thoracolumbosacral orthotic Restrictions Weight Bearing Restrictions: No    Mobility  Bed Mobility Overal bed mobility: Needs Assistance Bed Mobility: Sidelying to Sit;Sit to Sidelying;Rolling   Sidelying to sit: Min guard;Min assist     Sit to sidelying: Min guard;Min assist General bed mobility comments: pt assisted to log roll onto side and move sidelying to sit. Pt requires less cuing than previous session however still requires assistance to perform safely.  Transfers Overall transfer level: Needs assistance Equipment used: Rolling walker (2 wheeled) Transfers: Sit to/from Stand Sit to Stand: Min assist         General transfer comment: pt min assist sit to stand. Required multiple attempts due to knees buckling when attempted. Pt able to achieve upright posture however required CGA to min assist to maintain.   Ambulation/Gait Ambulation/Gait assistance: Min assist Gait Distance (Feet): 40 Feet Assistive device: Rolling walker (2 wheeled) Gait Pattern/deviations: Step-to pattern;Shuffle;Trunk flexed;Narrow base of support     General Gait Details: pt amb 38' with min assist, RW, and chair follow. Pt educated on RW use. Pt fatigues very easily during requiring seated rest breaks. pt demonstrates poor safety awareness and poor safety using RW. pt limited due to weakness of LEs, balance, and safety awareness. Pt demonstrates occasional knee buckling during amb.   Stairs             Wheelchair Mobility    Modified Rankin (Stroke Patients Only)       Balance Overall balance assessment: Needs assistance  Sitting balance-Leahy Scale: Good Sitting balance - Comments: Able to sit EOB with B LE supported and intermittent UE support to weight shift and alleviate pain.      Standing balance-Leahy Scale: Poor Standing balance comment: pt demonstrates poor standing balance during this session requiring B UE support and  CGA to min assist to maintain upright posture.                            Cognition Arousal/Alertness: Awake/alert Behavior During Therapy: WFL for tasks assessed/performed Overall Cognitive Status: Within Functional Limits for tasks assessed                                        Exercises Other Exercises Other Exercises: pt instructed in B LE exercises including seated and standing marching, LAQ, seated heel raises x10 with verbal cuing. Pt fatigues easily during. Pt states that he feels weak during exercises and attributes it to not eating for three days prior to admission.    General Comments        Pertinent Vitals/Pain Pain Assessment: 0-10 Pain Score: 7  Pain Location: Low back Pain Descriptors / Indicators: Aching;Jabbing;Sharp;Throbbing Pain Intervention(s): Limited activity within patient's tolerance;Monitored during session;Premedicated before session;Repositioned    Home Living                      Prior Function            PT Goals (current goals can now be found in the care plan section) Acute Rehab PT Goals Patient Stated Goal: pt states that he wants to keep moving PT Goal Formulation: With patient Time For Goal Achievement: 01/26/18 Potential to Achieve Goals: Good Progress towards PT goals: Progressing toward goals    Frequency    7X/week      PT Plan Current plan remains appropriate    Co-evaluation              AM-PAC PT "6 Clicks" Daily Activity  Outcome Measure  Difficulty turning over in bed (including adjusting bedclothes, sheets and blankets)?: Unable Difficulty moving from lying on back to sitting on the side of the bed? : Unable Difficulty sitting down on and standing up from a chair with arms (e.g., wheelchair, bedside commode, etc,.)?: Unable Help needed moving to and from a bed to chair (including a wheelchair)?: A Lot Help needed walking in hospital room?: A Lot Help needed climbing 3-5  steps with a railing? : A Lot 6 Click Score: 9    End of Session Equipment Utilized During Treatment: Gait belt Activity Tolerance: Patient limited by pain;Patient limited by fatigue(Limited due to general LE weakness) Patient left: in chair;with call bell/phone within reach;with chair alarm set Nurse Communication: Mobility status;Other (comment)(d/c recommendation) PT Visit Diagnosis: Unsteadiness on feet (R26.81);Other abnormalities of gait and mobility (R26.89);Repeated falls (R29.6);Muscle weakness (generalized) (M62.81);History of falling (Z91.81);Difficulty in walking, not elsewhere classified (R26.2);Pain Pain - part of body: (low back)     Time: 1342-1420 PT Time Calculation (min) (ACUTE ONLY): 38 min  Charges:                       G Codes:       Patrik Turnbaugh Marylu Lund, SPT   Elliotte Marsalis 01/12/2018, 3:29 PM

## 2018-01-12 NOTE — Evaluation (Signed)
Physical Therapy Evaluation Patient Details Name: Timothy Galvan MRN: [id number] DOB: 11-14-1955 Today's Date: 01/12/2018   History of Present Illness  pt presents to ED on 01/10/18 with complaints of back pain, chest pain, and SOB that began on 01/07/18 after a fall getiing out of car. Pt subsequently diagnosed with a closed compression fracture of the body of L1. Neuro consult states that fracture is non surgical. Pt has past medical history that includes CAD, GERD, heart disease, and cocaine use.    Clinical Impression  Pt is a pleasant 62 year old male who was admitted for a closed compression fracture of the body of L1 verterbra. Pts PLOF is independent without AD however pt states that he is not very active and is limited due to "poor circulation" in his LEs. Pt performs bed mobility with CGA to min assist. Pt educated on log rolling to increase safety and decrease pain with bed mobility, pt tolerated well and demonstrates safety performing. Pt demonstrates deficits with strength, mobility, and pain. Pt states that pain is 8/10 at rest and 10/10 with any active motion. MD orders require TLSO brace for pt to be up OOB. Pt still waiting for delivery of TLSO brace at this time. Pt demonstrates grossly at least 3+/5 strength of LEs however formal MMT was held due to pain. Pt sensation of LE WNL. At this time pt is drastically limited by pain. Pt could benefit from continued skilled therapy at this time to improve deficits toward PLOF. PT will continue to work with pt 7x/week while admitted. D/c recommendations at this time are SNF, however could change to HHPT once further assessment of mobility is able to be performed upon arrival of TLSO brace. This entire session was guided, instructed, and directly supervised by Greggory Stallion, DPT.       Follow Up Recommendations SNF(Need further assessment of mobility)    Equipment Recommendations  Rolling walker with 5" wheels    Recommendations for Other  Services       Precautions / Restrictions Precautions Precaution Comments: TLSO brace when up Required Braces or Orthoses: Spinal Brace Spinal Brace: Thoracolumbosacral orthotic Restrictions Weight Bearing Restrictions: No      Mobility  Bed Mobility Overal bed mobility: Needs Assistance Bed Mobility: Sidelying to Sit;Sit to Sidelying;Rolling(scooting)   Sidelying to sit: Min guard;Min assist     Sit to sidelying: Min guard;Min assist General bed mobility comments: Pt independent with scooting in bed however complains of increased pain during. Pt CGA to min assist to log roll onto side and move sidelying <> sit. Pt educated on log rolling to increase safety and decrease pain. Pt performed log rolling safely after education.  Transfers                 General transfer comment: declined this visit. MD orders state TLSO required when up, waiting for TLSO to be delivered.  Ambulation/Gait             General Gait Details: declined this visit. MD orders state TLSO required when up, waiting for TLSO to be delivered.  Stairs            Wheelchair Mobility    Modified Rankin (Stroke Patients Only)       Balance Overall balance assessment: Needs assistance   Sitting balance-Leahy Scale: Good Sitting balance - Comments: Able to sit EOB with B LE supported and intermittent UE support to weight shift and alleviate pain.        Standing  balance comment: declined this visit. MD orders state TLSO required when up, waiting for TLSO to be delivered.                             Pertinent Vitals/Pain Pain Assessment: 0-10 Pain Score: 8 (8/10 at rest, 10/10 with any active motion) Pain Location: Low back Pain Descriptors / Indicators: Aching;Jabbing;Sharp Pain Intervention(s): Limited activity within patient's tolerance;Monitored during session;Repositioned    Home Living Family/patient expects to be discharged to:: Private residence Living  Arrangements: Alone Available Help at Discharge: (none) Type of Home: Apartment Home Access: Stairs to enter Entrance Stairs-Rails: Right Entrance Stairs-Number of Steps: 2 Home Layout: One level Home Equipment: None      Prior Function Level of Independence: Independent         Comments: pt states that he was independent without AD however is limited due to "poor circulation" in LEs. Pt states that he is not very active at baseline. pt has history that includes multiple falls due to LE "giving out"     Hand Dominance        Extremity/Trunk Assessment   Upper Extremity Assessment Upper Extremity Assessment: Generalized weakness(possibly secondary to increased pain with active motion)    Lower Extremity Assessment Lower Extremity Assessment: RLE deficits/detail;LLE deficits/detail RLE Deficits / Details: Grossly at least 3+/5 declined formal MMT due to increased pain with active motion. Pt states that he feels weaker than baseline due to not eating for a few days after fall before coming to hospital. RLE Sensation: WNL LLE Deficits / Details: Grossly at least 3+/5 declined formal MMT due to increased pain with active motion. Pt states that he feels weaker than baseline due to not eating for a few days after fall before coming to hospital. LLE Sensation: WNL       Communication   Communication: No difficulties  Cognition Arousal/Alertness: Awake/alert Behavior During Therapy: WFL for tasks assessed/performed Overall Cognitive Status: Within Functional Limits for tasks assessed                                        General Comments      Exercises     Assessment/Plan    PT Assessment Patient needs continued PT services  PT Problem List Decreased strength;Decreased range of motion;Decreased balance;Decreased activity tolerance;Decreased mobility;Decreased coordination;Decreased cognition;Decreased knowledge of use of DME;Pain;Decreased knowledge of  precautions       PT Treatment Interventions DME instruction;Gait training;Stair training;Functional mobility training;Therapeutic activities;Therapeutic exercise;Balance training;Neuromuscular re-education    PT Goals (Current goals can be found in the Care Plan section)  Acute Rehab PT Goals Patient Stated Goal: pt states that he wants to keep moving PT Goal Formulation: With patient Time For Goal Achievement: 01/26/18 Potential to Achieve Goals: Good    Frequency 7X/week   Barriers to discharge        Co-evaluation               AM-PAC PT "6 Clicks" Daily Activity  Outcome Measure Difficulty turning over in bed (including adjusting bedclothes, sheets and blankets)?: Unable Difficulty moving from lying on back to sitting on the side of the bed? : Unable Difficulty sitting down on and standing up from a chair with arms (e.g., wheelchair, bedside commode, etc,.)?: Unable Help needed moving to and from a bed to chair (including a wheelchair)?: A Lot  Help needed walking in hospital room?: A Lot Help needed climbing 3-5 steps with a railing? : A Lot 6 Click Score: 9    End of Session Equipment Utilized During Treatment: Gait belt Activity Tolerance: Patient limited by pain(limited by no TLSO) Patient left: in bed;with call bell/phone within reach;with bed alarm set Nurse Communication: Mobility status;Other (comment)(need for TLSO) PT Visit Diagnosis: Unsteadiness on feet (R26.81);Other abnormalities of gait and mobility (R26.89);Repeated falls (R29.6);Muscle weakness (generalized) (M62.81);History of falling (Z91.81);Difficulty in walking, not elsewhere classified (R26.2);Pain Pain - part of body: (low back)    Time: 1216-2446 PT Time Calculation (min) (ACUTE ONLY): 17 min   Charges:         PT G Codes:        Griffen Frayne, SPT  Shawndell Varas 01/12/2018, 10:37 AM

## 2018-01-12 NOTE — Clinical Social Work Note (Signed)
Clinical Social Work Assessment  Patient Details  Name: Timothy Galvan MRN: [id number] Date of Birth: Jul 24, 1955  Date of referral:  01/12/18               Reason for consult:  Facility Placement                Permission sought to share information with:  Chartered certified accountant granted to share information::  Yes, Verbal Permission Granted  Name::      [place]::   [place]   Relationship::     Contact Information:     Housing/Transportation Living arrangements for the past 2 months:  [place] of Information:  Patient Patient Interpreter Needed:  None Criminal Activity/Legal Involvement Pertinent to Current Situation/Hospitalization:  No - Comment as needed Significant Relationships:  Adult Children Lives with:  Self Do you feel safe going back to the place where you live?  Yes Need for family participation in patient care:  Yes (Comment)  Care giving concerns:  Patient lives alone in [place].    Social Worker assessment / plan:  Holiday representative (CSW) reviewed chart and noted that PT is recommending SNF. CSW met with patient alone at bedside to discuss D/C plan. Patient was alert and oriented X4 and was laying in the bed. CSW introduced self and explained role of CSW department. Patient reported that he lives alone and has 4 adult children. CSW explained SNF process and that [facility] will have to approve it. Patient reported that he wants to go to SNF because he has limited support at home. FL2 complete and faxed out.   CSW presented bed offers to patient. He chose [facility]. Per [facility] admissions coordinator at [facility] SNF authorization has been received.   CSW discussed case with MD who stated that patient will benefit from more pain control and going home with home health. RN case manager aware of above. CSW will continue to follow and assist as needed.     Employment status:   Disabled (Comment on whether or not currently receiving Disability) Insurance information:  Managed Medicare PT Recommendations:  [place] / Referral to community resources:  [place]  Patient/Family's Response to care:  Per MD patient is agreeable to D/C home.   Patient/Family's Understanding of and Emotional Response to Diagnosis, Current Treatment, and Prognosis:  Patient was very pleasant and thanked CSW for assistance.   Emotional Assessment Appearance:  Appears stated age Attitude/Demeanor/Rapport:    Affect (typically observed):  Accepting, Adaptable, Pleasant Orientation:  Oriented to Self, Oriented to Place, Oriented to  Time, Oriented to Situation Alcohol / Substance use:  Not Applicable Psych involvement (Current and /or in the community):  No (Comment)  Discharge Needs  Concerns to be addressed:  Discharge Planning Concerns Readmission within the last 30 days:  No Current discharge risk:  Dependent with Mobility Barriers to Discharge:  Continued Medical Work up   [employer], Veronia Beets, LCSW 01/12/2018, 3:54 PM

## 2018-01-12 NOTE — NC FL2 (Signed)
[place] LEVEL OF CARE SCREENING TOOL     IDENTIFICATION  Patient Name: Timothy Galvan Birthdate: [birth year] Sex: male Admission Date (Current Location): 01/10/2018  [facility] and [place] Number:  Timothy Galvan ([id number]) Facility and Address:  [facility], [address] [place],  [address]      Provider Number: [id number]  Attending Physician Name and Address:  Henreitta Leber, MD  Relative Name and Phone Number:       Current Level of Care: Hospital Recommended Level of Care: [place] Prior Approval Number:    Date Approved/Denied:   PASRR Number: [id number])  Discharge Plan: SNF    Current Diagnoses: Patient Active Problem List   Diagnosis Date Noted  . GERD (gastroesophageal reflux disease) 01/10/2018  . CAD (coronary artery disease) 01/10/2018  . Closed compression fracture of body of L1 vertebra ([place]) 01/10/2018  . HLD (hyperlipidemia) 01/10/2018    Orientation RESPIRATION BLADDER Height & Weight     Self, Time, Situation, Place  Normal Continent Weight: 115 lb (52.2 kg) Height:  5\' 3"  (160 cm)  BEHAVIORAL SYMPTOMS/MOOD NEUROLOGICAL BOWEL NUTRITION STATUS      Continent Diet(Diet: Heart Healthy )  AMBULATORY STATUS COMMUNICATION OF NEEDS Skin   Extensive Assist Verbally Normal                       Personal Care Assistance Level of Assistance  Bathing, Feeding, Dressing Bathing Assistance: Limited assistance Feeding assistance: Independent Dressing Assistance: Limited assistance     Functional Limitations Info  Sight, Hearing, Speech Sight Info: Adequate Hearing Info: Adequate Speech Info: Adequate    SPECIAL CARE FACTORS FREQUENCY  PT (By licensed PT), OT (By licensed OT)     PT Frequency: (5) OT Frequency: (5)            Contractures      Additional Factors Info  Code Status, Allergies Code Status Info: (Full Code. ) Allergies Info: (Penicillins)            Current Medications (01/12/2018):  This is the current hospital active medication list Current Facility-Administered Medications  Medication Dose Route Frequency Provider Last Rate Last Dose  . acetaminophen (TYLENOL) tablet 650 mg  650 mg Oral Q6H PRN Lance Coon, MD   650 mg at 01/12/18 0944   Or  . acetaminophen (TYLENOL) suppository 650 mg  650 mg Rectal Q6H PRN Lance Coon, MD      . aspirin EC tablet 81 mg  81 mg Oral Corwin Levins, MD   81 mg at 01/11/18 2137  . atorvastatin (LIPITOR) tablet 40 mg  40 mg Oral Corwin Levins, MD   40 mg at 01/11/18 2137  . enoxaparin (LOVENOX) injection 40 mg  40 mg Subcutaneous Q24H Lance Coon, MD   40 mg at 01/11/18 2137  . ipratropium-albuterol (DUONEB) 0.5-2.5 (3) MG/3ML nebulizer solution 3 mL  3 mL Inhalation Q6H PRN Lance Coon, MD      . isosorbide mononitrate (IMDUR) 24 hr tablet 60 mg  60 mg Oral Corwin Levins, MD   60 mg at 01/11/18 2137  . morphine 2 MG/ML injection 2 mg  2 mg Intravenous Q3H PRN Lance Coon, MD   2 mg at 01/12/18 0515  . ondansetron (ZOFRAN) tablet 4 mg  4 mg Oral Q6H PRN Lance Coon, MD       Or  . ondansetron [facility]) injection 4 mg  4 mg Intravenous Q6H PRN Lance Coon, MD      .  oxyCODONE (Oxy IR/ROXICODONE) immediate release tablet 5 mg  5 mg Oral Q4H PRN Lance Coon, MD   5 mg at 01/12/18 0944  . pantoprazole (PROTONIX) EC tablet 40 mg  40 mg Oral Daily Lance Coon, MD   40 mg at 01/12/18 0233     Discharge Medications: Please see discharge summary for a list of discharge medications.  Relevant Imaging Results:  Relevant Lab Results:   Additional Information (SSN: [id number])  Timothy Galvan, Timothy Beets, LCSW

## 2018-01-12 NOTE — Clinical Social Work Placement (Signed)
   CLINICAL SOCIAL WORK PLACEMENT  NOTE  Date:  01/12/2018  Patient Details  Name: Timothy Galvan MRN: [id number] Date of Birth: Jul 03, 1956  Clinical Social Work is seeking post-discharge placement for this patient at the [place] level of care (*CSW will initial, date and re-position this form in  chart as items are completed):  Yes   Patient/family provided with [place] Work Department's list of facilities offering this level of care within the geographic area requested by the patient (or if unable, by the patient's family).  Yes   Patient/family informed of their freedom to choose among providers that offer the needed level of care, that participate in Medicare, Medicaid or managed care program needed by the patient, have an available bed and are willing to accept the patient.  Yes   Patient/family informed of [place]'s ownership interest in [facility] and [facility], as well as of the fact that they are under no obligation to receive care at these facilities.  PASRR submitted to EDS on 01/12/18     PASRR number received on 01/12/18     Existing PASRR number confirmed on       FL2 transmitted to all facilities in geographic area requested by pt/family on 01/12/18     FL2 transmitted to all facilities within larger geographic area on       Patient informed that his/her managed care company has contracts with or will negotiate with certain facilities, including the following:        Yes   Patient/family informed of bed offers received.  Patient chooses bed at [facility] )     Physician recommends and patient chooses bed at      Patient to be transferred to   on  .  Patient to be transferred to facility by       Patient family notified on   of transfer.  Name of family member notified:        PHYSICIAN       Additional Comment:    _______________________________________________ Natassja Ollis, Veronia Beets,  LCSW 01/12/2018, 3:54 PM

## 2018-01-12 NOTE — Progress Notes (Signed)
[place] [facility] [place] NAME: Timothy Galvan    MR#:  [id number]  DATE OF BIRTH:  28-Oct-1955  SUBJECTIVE:   Patient here after a fall and complaining of some back pain noted to have a L1 compression fracture.  Patient has no radicular symptoms and says his pain is somewhat improved since yesterday.  REVIEW OF SYSTEMS:    Review of Systems  Constitutional: Negative for chills and fever.  HENT: Negative for congestion and tinnitus.   Eyes: Negative for blurred vision and double vision.  Respiratory: Negative for cough, shortness of breath and wheezing.   Cardiovascular: Negative for chest pain, orthopnea and PND.  Gastrointestinal: Negative for abdominal pain, diarrhea, nausea and vomiting.  Genitourinary: Negative for dysuria and hematuria.  Musculoskeletal: Positive for back pain.  Neurological: Negative for dizziness, sensory change and focal weakness.  All other systems reviewed and are negative.   Nutrition: Heart Healthy Tolerating Diet: Yes Tolerating PT: Eval noted.   DRUG ALLERGIES:   Allergies  Allergen Reactions  . Penicillins     Has patient had a PCN reaction causing immediate rash, facial/tongue/throat swelling, SOB or lightheadedness with hypotension: Unknown Has patient had a PCN reaction causing severe rash involving mucus membranes or skin necrosis: Unknown Has patient had a PCN reaction that required hospitalization: Unknown Has patient had a PCN reaction occurring within the last 10 years: Unknown If all of the above answers are "NO", then may proceed with Cephalosporin use.     VITALS:  Blood pressure 133/84, pulse (!) 55, temperature 97.6 F (36.4 C), temperature source Oral, resp. rate 18, height 5\' 3"  (1.6 m), weight 52.2 kg (115 lb), SpO2 96 %.  PHYSICAL EXAMINATION:   Physical Exam  GENERAL:  62 y.o.-year-old patient lying in bed in no acute distress.  EYES: Pupils equal, round, reactive to light and  accommodation. No scleral icterus. Extraocular muscles intact.  HEENT: Head atraumatic, normocephalic. Oropharynx and nasopharynx clear.  NECK:  Supple, no jugular venous distention. No thyroid enlargement, no tenderness.  LUNGS: Normal breath sounds bilaterally, no wheezing, rales, rhonchi. No use of accessory muscles of respiration.  CARDIOVASCULAR: S1, S2 normal. No murmurs, rubs, or gallops.  ABDOMEN: Soft, nontender, nondistended. Bowel sounds present. No organomegaly or mass.  EXTREMITIES: No cyanosis, clubbing or edema b/l.    NEUROLOGIC: Cranial nerves II through XII are intact. No focal Motor or sensory deficits b/l. Globally weak.    PSYCHIATRIC: The patient is alert and oriented x 3.  SKIN: No obvious rash, lesion, or ulcer.    LABORATORY PANEL:   CBC Recent Labs  Lab 01/11/18 0538  WBC 7.1  HGB 13.9  HCT 40.5  PLT 197   ------------------------------------------------------------------------------------------------------------------  Chemistries  Recent Labs  Lab 01/11/18 0538  NA 139  K 4.1  CL 107  CO2 27  GLUCOSE 93  BUN 17  CREATININE 0.98  CALCIUM 8.5*   ------------------------------------------------------------------------------------------------------------------  Cardiac Enzymes Recent Labs  Lab 01/10/18 1739  TROPONINI <0.03   ------------------------------------------------------------------------------------------------------------------  RADIOLOGY:  Ct Angio Chest Pe W And/or Wo Contrast  Result Date: 01/10/2018 CLINICAL DATA:  Chest pain with deep breathing and coughing. The patient has had a productive cough with chills since 01/07/2018. EXAM: CT ANGIOGRAPHY CHEST WITH CONTRAST TECHNIQUE: Multidetector CT imaging of the chest was performed using the standard protocol during bolus administration of intravenous contrast. Multiplanar CT image reconstructions and MIPs were obtained to evaluate the vascular anatomy. CONTRAST:  75 ml OMNIPAQUE  IOHEXOL  350 MG/ML SOLN COMPARISON:  CT chest 04/01/2018. FINDINGS: Cardiovascular: No pulmonary embolus is identified. There is calcific aortic and coronary atherosclerosis. No pericardial effusion. Mild cardiomegaly noted. Mediastinum/Nodes: No enlarged mediastinal, hilar, or axillary lymph nodes. Thyroid gland, trachea, and esophagus demonstrate no significant findings. Lungs/Pleura: No pleural effusion. Extensive emphysematous disease is present. Mild dependent atelectasis is noted. No consolidative process, nodule or mass. Upper Abdomen: No acute or focal abnormality. Musculoskeletal: Negative. Review of the MIP images confirms the above findings. IMPRESSION: Negative for pulmonary embolus or acute disease. Calcific coronary artery disease. Aortic atherosclerosis (ICD10-I70.0) and extensive emphysema (ICD10-J43.9). Electronically Signed   By: Inge Rise M.D.   On: 01/10/2018 20:22   Ct Lumbar Spine Wo Contrast  Result Date: 01/10/2018 CLINICAL DATA:  Initial evaluation for acute back pain status post recent fall. EXAM: CT LUMBAR SPINE WITHOUT CONTRAST TECHNIQUE: Multidetector CT imaging of the lumbar spine was performed without intravenous contrast administration. Multiplanar CT image reconstructions were also generated. COMPARISON:  Prior CT from 04/01/2017. FINDINGS: Segmentation: Normal segmentation. Lowest well-formed disc labeled the L5-S1 level. Alignment: Mild straightening of the normal lumbar lordosis. No listhesis or malalignment. Vertebrae: Mild height loss of up to 25% at the superior endplate of L1, suspicious for acute compression fracture. This is benign/mechanical in appearance. No bony retropulsion. No other acute injury within the lumbar spine. Mild central endplate height loss at a few additional levels appears chronic in nature. Visualized sacrum and pelvis are intact. SI joints symmetric and partially ankylosed bilaterally. No discrete osseous lesions. Remotely healed fracture of  the left posterior eleventh rib noted. Paraspinal and other soft tissues: Paraspinous soft tissues demonstrate no acute finding. Extensive aorto bi-iliac atherosclerotic disease. Infrarenal aorta ectatic measuring up to 2.5 cm in diameter. Visualized visceral structures otherwise unremarkable. Disc levels: L1-2:  Mild bilateral facet hypertrophy.  No stenosis. L2-3: Mild diffuse disc bulge with facet hypertrophy. No significant stenosis. L3-4: Mild disc bulge with bilateral facet hypertrophy. No stenosis. L4-5: Diffuse disc bulge, slightly asymmetric to the left. Mild bilateral facet and ligament flavum hypertrophy. No significant stenosis. L5-S1: Diffuse disc bulge, slightly asymmetric to the right. Bulging disc closely approximates the descending S1 nerve roots bilaterally, greater on the right. Mild facet hypertrophy. Mild to moderate right with mild left L5 foraminal stenosis. IMPRESSION: 1. Acute compression fracture involving the superior endplate of L1 with up to 25% height loss without bony retropulsion. This is benign/mechanical in appearance. 2. Mild degenerative disc bulging and facet hypertrophy throughout the lumbar spine without significant spinal stenosis. Moderate right L5 foraminal narrowing. 3. **An incidental finding of potential clinical significance has been found. 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 Recommend followup by ultrasound in 5 years. This recommendation follows ACR consensus guidelines: White Paper of the ACR Incidental Findings Committee II on Vascular Findings. J Am Coll Radiol 2013; 10:789-794.** Electronically Signed   By: Jeannine Boga M.D.   On: 01/10/2018 20:28     ASSESSMENT AND PLAN:   62 year old male with past medical history of coronary artery disease, GERD, essential hypertension, hyperlipidemia who presented to the hospital due to back pain and noted to have L1 compression fracture.  1.  Back  pain/compression fracture-patient had a fall last week and had worsening back pain and therefore presented to the hospital.  CT scan of the lumbar spine shows an L1 compression fracture. - Patient has no radicular symptoms of weakness numbness or tingling. - Seen by neurosurgery and  no plans for surgical intervention.  Patient has been placed in a brace and cont. Oxy PRN for pain. Will add some Meloxicam.  -PT assessment initially said short-term rehab but patient really does not have any rehab it will need to.  Discussed with patient about possibly being discharge home with home health services.  He agrees with that and will adjust his pain meds and have PT do a reassessment in the morning.  2.  Essential hypertension-continue Imdur  3.  Hyperlipidemia-continue atorvastatin.  4.  History of CAD-no acute chest pain.  Continue aspirin, statin, Imdur.  5.  GERD-continue Protonix.  Dispo -pending physical therapy reeval in a.m.     All the records are reviewed and case discussed with Care Management/Social Worker. Management plans discussed with the patient, family and they are in agreement.  CODE STATUS: Full code  DVT Prophylaxis: Lovenox  TOTAL TIME TAKING CARE OF THIS PATIENT: 30 minutes.   POSSIBLE D/C IN 1-2 DAYS, DEPENDING ON CLINICAL CONDITION.   Henreitta Leber M.D on 01/12/2018 at 2:57 PM  Between 7am to 6pm - Pager - [phone]  After 6pm go to [email or web address] - Proofreader  [facility]  Office  [phone]  CC: Primary care physician; Petra Kuba, MD

## 2018-01-13 DIAGNOSIS — M4856XA Collapsed vertebra, not elsewhere classified, lumbar region, initial encounter for fracture: Secondary | ICD-10-CM | POA: Diagnosis not present

## 2018-01-13 MED ORDER — ACETAMINOPHEN 325 MG PO TABS: 650.0000 mg | ORAL_TABLET | Freq: Four times a day (QID) | ORAL | Status: DC | PRN

## 2018-01-13 MED ORDER — OXYCODONE HCL 5 MG PO TABS: 5.0000 mg | ORAL_TABLET | Freq: Four times a day (QID) | ORAL | 0 refills | Status: DC | PRN

## 2018-01-13 MED ORDER — ENSURE ENLIVE PO LIQD: 237.0000 mL | Freq: Two times a day (BID) | ORAL | 0 refills | Status: DC

## 2018-01-13 MED ORDER — MELOXICAM 15 MG PO TABS: 15.0000 mg | ORAL_TABLET | Freq: Every day | ORAL | 0 refills | Status: DC

## 2018-01-13 MED ORDER — CALCIUM CARBONATE-VITAMIN D 500-200 MG-UNIT PO TABS: 1.0000 | ORAL_TABLET | Freq: Two times a day (BID) | ORAL | 0 refills | Status: DC

## 2018-01-13 MED ORDER — ALENDRONATE SODIUM 70 MG PO TABS: 70.0000 mg | ORAL_TABLET | ORAL | 0 refills | Status: DC

## 2018-01-13 NOTE — Discharge Summary (Signed)
[facility] [place] NAME: Leslie Langille    MR#:  [id number]  DATE OF BIRTH:  Dec 09, 1955  DATE OF ADMISSION:  01/10/2018 ADMITTING PHYSICIAN: Lance Coon, MD  DATE OF DISCHARGE: 01/13/2018  PRIMARY CARE PHYSICIAN: Petra Kuba, MD    ADMISSION DIAGNOSIS:  Other closed fracture of first lumbar vertebra, initial encounter ([place]) [S32.018A] Chest pain, unspecified type [R07.9]  DISCHARGE DIAGNOSIS:  Principal Problem:   Closed compression fracture of body of L1 vertebra (HCC) Active Problems:   GERD (gastroesophageal reflux disease)   CAD (coronary artery disease)   HLD (hyperlipidemia)   SECONDARY DIAGNOSIS:   Past Medical History:  Diagnosis Date  . Blood clot in vein   . Cancer ([place])   . Coronary artery disease   . GERD (gastroesophageal reflux disease)   . Heart disease   . Pneumonia     HOSPITAL COURSE:   1.  Compression fracture L1 after fall last week with worsening back pain.  Patient able to straight leg raise.  Patient seen in consultation by neurosurgery and they ordered a brace which the patient has on.  Physical therapy saw the patient twice yesterday and recommended rehab.  The patient lives alone and family is out of town and he is concerned that no one can help take care of him.  Patient will be discharged to rehab.  Will start Fosamax and calcium and with the vitamin D as outpatient. 2.  Hyperlipidemia unspecified on atorvastatin 3.  History of CAD on aspirin statin and Imdur 4.  GERD on PPI  DISCHARGE CONDITIONS:   Satisfactory  CONSULTS OBTAINED:  Treatment Team:  Meade Maw, MD  DRUG ALLERGIES:   Allergies  Allergen Reactions  . Penicillins     Has patient had a PCN reaction causing immediate rash, facial/tongue/throat swelling, SOB or lightheadedness with hypotension: Unknown Has patient had a PCN reaction causing severe rash involving mucus membranes or skin necrosis: Unknown Has  patient had a PCN reaction that required hospitalization: Unknown Has patient had a PCN reaction occurring within the last 10 years: Unknown If all of the above answers are "NO", then may proceed with Cephalosporin use.     DISCHARGE MEDICATIONS:   Allergies as of 01/13/2018      Reactions   Penicillins    Has patient had a PCN reaction causing immediate rash, facial/tongue/throat swelling, SOB or lightheadedness with hypotension: Unknown Has patient had a PCN reaction causing severe rash involving mucus membranes or skin necrosis: Unknown Has patient had a PCN reaction that required hospitalization: Unknown Has patient had a PCN reaction occurring within the last 10 years: Unknown If all of the above answers are "NO", then may proceed with Cephalosporin use.      Medication List    STOP taking these medications   albuterol 108 (90 Base) MCG/ACT inhaler Commonly known as:  PROVENTIL HFA;VENTOLIN HFA   ibuprofen 600 MG tablet Commonly known as:  ADVIL,MOTRIN     TAKE these medications   acetaminophen 325 MG tablet Commonly known as:  TYLENOL Take 2 tablets (650 mg total) by mouth every 6 (six) hours as needed for mild pain (or Fever >/= 101).   alendronate 70 MG tablet Commonly known as:  FOSAMAX Take 1 tablet (70 mg total) by mouth once a week. Take with a full glass of water on an empty stomach.   aspirin EC 81 MG tablet Take 81 mg by mouth at bedtime.   atorvastatin 40  MG tablet Commonly known as:  LIPITOR Take 40 mg by mouth at bedtime.   calcium-vitamin D 500-200 MG-UNIT tablet Commonly known as:  OSCAL 500/200 D-3 Take 1 tablet by mouth 2 (two) times daily.   feeding supplement (ENSURE ENLIVE) Liqd Take 237 mLs by mouth 2 (two) times daily between meals.   Ipratropium-Albuterol 20-100 MCG/ACT Aers respimat Commonly known as:  COMBIVENT Inhale 1 puff into the lungs every 6 (six) hours as needed for wheezing.   isosorbide mononitrate 60 MG 24 hr  tablet Commonly known as:  IMDUR Take 60 mg by mouth at bedtime.   Melatonin 5 MG Tabs Take 5 mg by mouth at bedtime.   meloxicam 15 MG tablet Commonly known as:  MOBIC Take 1 tablet (15 mg total) by mouth daily.   nitroGLYCERIN 0.4 MG SL tablet Commonly known as:  NITROSTAT Place 0.4 mg under the tongue every 5 (five) minutes as needed for chest pain.   omeprazole 20 MG capsule Commonly known as:  PRILOSEC Take 20 mg by mouth at bedtime.   oxyCODONE 5 MG immediate release tablet Commonly known as:  Oxy IR/ROXICODONE Take 1 tablet (5 mg total) by mouth every 6 (six) hours as needed for moderate pain.        DISCHARGE INSTRUCTIONS:   Follow-up with Dr. at rehab 1 day  follow-up neurosurgery 4 weeks   If you experience worsening of your admission symptoms, develop shortness of breath, life threatening emergency, suicidal or homicidal thoughts you must seek medical attention immediately by calling 911 or calling your MD immediately  if symptoms less severe.  You Must read complete instructions/literature along with all the possible adverse reactions/side effects for all the Medicines you take and that have been prescribed to you. Take any new Medicines after you have completely understood and accept all the possible adverse reactions/side effects.   Please note  You were cared for by a hospitalist during your hospital stay. If you have any questions about your discharge medications or the care you received while you were in the hospital after you are discharged, you can call the unit and asked to speak with the hospitalist on call if the hospitalist that took care of you is not available. Once you are discharged, your primary care physician will handle any further medical issues. Please note that NO REFILLS for any discharge medications will be authorized once you are discharged, as it is imperative that you return to your primary care physician (or establish a relationship with a  primary care physician if you do not have one) for your aftercare needs so that they can reassess your need for medications and monitor your lab values.    Today   CHIEF COMPLAINT:   Chief Complaint  Patient presents with  . Fall  . Chest Pain    HISTORY OF PRESENT ILLNESS:  Demarus Latterell  is a 62 y.o. male came in after a fall and having back pain    VITAL SIGNS:  Blood pressure 130/80, pulse (!) 49, temperature 97.8 F (36.6 C), temperature source Oral, resp. rate 17, height 5\' 3"  (1.6 m), weight 52.2 kg (115 lb), SpO2 98 %.    PHYSICAL EXAMINATION:  GENERAL:  62 y.o.-year-old patient lying in the bed with no acute distress.  EYES: Pupils equal, round, reactive to light and accommodation. No scleral icterus. Extraocular muscles intact.  HEENT: Head atraumatic, normocephalic. Oropharynx and nasopharynx clear.  NECK:  Supple, no jugular venous distention. No thyroid enlargement, no  tenderness.  LUNGS: Normal breath sounds bilaterally, no wheezing, rales,rhonchi or crepitation. No use of accessory muscles of respiration.  CARDIOVASCULAR: S1, S2 normal. No murmurs, rubs, or gallops.  ABDOMEN: Soft, non-tender, non-distended. Bowel sounds present. No organomegaly or mass.  EXTREMITIES: No pedal edema, cyanosis, or clubbing.  NEUROLOGIC: Cranial nerves II through XII are intact. Muscle strength 5/5 in all extremities. Sensation intact. Gait not checked.  Patient able to straight leg raise bilaterally without a problem. PSYCHIATRIC: The patient is alert and oriented x 3.  SKIN: No obvious rash, lesion, or ulcer.   DATA REVIEW:   CBC Recent Labs  Lab 01/11/18 0538  WBC 7.1  HGB 13.9  HCT 40.5  PLT 197    Chemistries  Recent Labs  Lab 01/11/18 0538  NA 139  K 4.1  CL 107  CO2 27  GLUCOSE 93  BUN 17  CREATININE 0.98  CALCIUM 8.5*    Cardiac Enzymes Recent Labs  Lab 01/10/18 1739  TROPONINI <0.03     Management plans discussed with the patient, and he is  in agreement.  CODE STATUS:     Code Status Orders  (From admission, onward)        Start     Ordered   01/10/18 2202  Full code  Continuous     01/10/18 2202    Code Status History    This patient has a current code status but no historical code status.      TOTAL TIME TAKING CARE OF THIS PATIENT: 35 minutes.    Loletha Grayer M.D on 01/13/2018 at 7:49 AM  Between 7am to 6pm - Pager - [phone]  After 6pm go to [email or web address] - password [facility]  [phone]  CC: Primary care physician; Petra Kuba, MD

## 2018-01-13 NOTE — Progress Notes (Signed)
Called report to facility to [facility]. EMS requested for transfer. Rx x5 in packet. IV DCd.

## 2018-01-13 NOTE — Progress Notes (Signed)
Per MD patient can D/C to [facility] today. Per [facility] admissions coordinator at [facility] authorization has been received and patient can come today. RN will call report and arrange EMS for transport. Patient is aware of above. CSW left a voicemail for patient's nephew Vincente Liberty. Please reconsult if future social work needs arise. CSW signing off.   [employer], LCSW [phone]

## 2018-01-13 NOTE — Clinical Social Work Placement (Signed)
   CLINICAL SOCIAL WORK PLACEMENT  NOTE  Date:  01/13/2018  Patient Details  Name: Timothy Galvan MRN: [id number] Date of Birth: 23-May-1956  Clinical Social Work is seeking post-discharge placement for this patient at the [place] level of care (*CSW will initial, date and re-position this form in  chart as items are completed):  Yes   Patient/family provided with [place] Work Department's list of facilities offering this level of care within the geographic area requested by the patient (or if unable, by the patient's family).  Yes   Patient/family informed of their freedom to choose among providers that offer the needed level of care, that participate in Medicare, Medicaid or managed care program needed by the patient, have an available bed and are willing to accept the patient.  Yes   Patient/family informed of [facility]'s ownership interest in [facility] and [facility], as well as of the fact that they are under no obligation to receive care at these facilities.  PASRR submitted to EDS on 01/12/18     PASRR number received on 01/12/18     Existing PASRR number confirmed on       FL2 transmitted to all facilities in geographic area requested by pt/family on 01/12/18     FL2 transmitted to all facilities within larger geographic area on       Patient informed that his/her managed care company has contracts with or will negotiate with certain facilities, including the following:        Yes   Patient/family informed of bed offers received.  Patient chooses bed at [facility] )     Physician recommends and patient chooses bed at      Patient to be transferred to [facility] ) on 01/13/18.  Patient to be transferred to facility by [facility] )     Patient family notified on 01/13/18 of transfer.  Name of family member notified:  (CSW left patient's nephew Timothy Galvan a Advertising account executive. )     PHYSICIAN        Additional Comment:    _______________________________________________ Arleen Bar, Veronia Beets, LCSW 01/13/2018, 8:08 AM

## 2019-01-16 ENCOUNTER — Emergency Department: Payer: Medicare Other

## 2019-01-16 ENCOUNTER — Other Ambulatory Visit: Payer: Self-pay

## 2019-01-16 ENCOUNTER — Observation Stay
Admission: EM | Admit: 2019-01-16 | Discharge: 2019-01-18 | Disposition: A | Discharge status: Home / Self Care | Payer: Medicare Other | Attending: Internal Medicine | Admitting: Internal Medicine

## 2019-01-16 ENCOUNTER — Encounter: Payer: Self-pay | Admitting: Emergency Medicine

## 2019-01-16 DIAGNOSIS — I739 Peripheral vascular disease, unspecified: Secondary | ICD-10-CM | POA: Insufficient documentation

## 2019-01-16 DIAGNOSIS — Z79899 Other long term (current) drug therapy: Secondary | ICD-10-CM | POA: Insufficient documentation

## 2019-01-16 DIAGNOSIS — E860 Dehydration: Secondary | ICD-10-CM | POA: Diagnosis not present

## 2019-01-16 DIAGNOSIS — I6521 Occlusion and stenosis of right carotid artery: Secondary | ICD-10-CM | POA: Insufficient documentation

## 2019-01-16 DIAGNOSIS — I700 Atherosclerosis of aorta: Secondary | ICD-10-CM | POA: Diagnosis not present

## 2019-01-16 DIAGNOSIS — I491 Atrial premature depolarization: Secondary | ICD-10-CM | POA: Insufficient documentation

## 2019-01-16 DIAGNOSIS — E785 Hyperlipidemia, unspecified: Secondary | ICD-10-CM | POA: Insufficient documentation

## 2019-01-16 DIAGNOSIS — K219 Gastro-esophageal reflux disease without esophagitis: Secondary | ICD-10-CM | POA: Insufficient documentation

## 2019-01-16 DIAGNOSIS — K029 Dental caries, unspecified: Secondary | ICD-10-CM | POA: Insufficient documentation

## 2019-01-16 DIAGNOSIS — I2510 Atherosclerotic heart disease of native coronary artery without angina pectoris: Secondary | ICD-10-CM | POA: Insufficient documentation

## 2019-01-16 DIAGNOSIS — F329 Major depressive disorder, single episode, unspecified: Secondary | ICD-10-CM | POA: Insufficient documentation

## 2019-01-16 DIAGNOSIS — J439 Emphysema, unspecified: Secondary | ICD-10-CM | POA: Diagnosis not present

## 2019-01-16 DIAGNOSIS — G894 Chronic pain syndrome: Secondary | ICD-10-CM | POA: Diagnosis not present

## 2019-01-16 DIAGNOSIS — R079 Chest pain, unspecified: Secondary | ICD-10-CM | POA: Diagnosis not present

## 2019-01-16 DIAGNOSIS — Z1159 Encounter for screening for other viral diseases: Secondary | ICD-10-CM | POA: Insufficient documentation

## 2019-01-16 DIAGNOSIS — L03211 Cellulitis of face: Secondary | ICD-10-CM

## 2019-01-16 DIAGNOSIS — I10 Essential (primary) hypertension: Secondary | ICD-10-CM | POA: Insufficient documentation

## 2019-01-16 DIAGNOSIS — J45909 Unspecified asthma, uncomplicated: Secondary | ICD-10-CM | POA: Diagnosis not present

## 2019-01-16 DIAGNOSIS — N179 Acute kidney failure, unspecified: Secondary | ICD-10-CM | POA: Insufficient documentation

## 2019-01-16 DIAGNOSIS — Z7982 Long term (current) use of aspirin: Secondary | ICD-10-CM | POA: Diagnosis not present

## 2019-01-16 DIAGNOSIS — F17210 Nicotine dependence, cigarettes, uncomplicated: Secondary | ICD-10-CM | POA: Diagnosis not present

## 2019-01-16 DIAGNOSIS — K047 Periapical abscess without sinus: Secondary | ICD-10-CM | POA: Diagnosis present

## 2019-01-16 DIAGNOSIS — R0789 Other chest pain: Secondary | ICD-10-CM

## 2019-01-16 DIAGNOSIS — M549 Dorsalgia, unspecified: Secondary | ICD-10-CM | POA: Diagnosis not present

## 2019-01-16 HISTORY — DX: Unspecified asthma, uncomplicated: J45.909

## 2019-01-16 HISTORY — DX: Essential (primary) hypertension: I10

## 2019-01-16 LAB — CBC
HCT: 43.6 % (ref 39.0–52.0)
Hemoglobin: 14.6 g/dL (ref 13.0–17.0)
MCH: 30.5 pg (ref 26.0–34.0)
MCHC: 33.5 g/dL (ref 30.0–36.0)
MCV: 91.2 fL (ref 80.0–100.0)
Platelets: 248 10*3/uL (ref 150–400)
RBC: 4.78 MIL/uL (ref 4.22–5.81)
RDW: 13.8 % (ref 11.5–15.5)
WBC: 7.9 10*3/uL (ref 4.0–10.5)
nRBC: 0 % (ref 0.0–0.2)

## 2019-01-16 LAB — BASIC METABOLIC PANEL
Anion gap: 8 (ref 5–15)
BUN: 19 mg/dL (ref 8–23)
CO2: 24 mmol/L (ref 22–32)
Calcium: 9.2 mg/dL (ref 8.9–10.3)
Chloride: 107 mmol/L (ref 98–111)
Creatinine, Ser: 1.27 mg/dL — ABNORMAL HIGH (ref 0.61–1.24)
GFR calc Af Amer: >60 mL/min (ref >60)
GFR calc non Af Amer: 59 mL/min — ABNORMAL LOW (ref >60)
Glucose, Bld: 110 mg/dL — ABNORMAL HIGH (ref 70–99)
Potassium: 3.9 mmol/L (ref 3.5–5.1)
Sodium: 139 mmol/L (ref 135–145)

## 2019-01-16 LAB — TROPONIN I (HIGH SENSITIVITY)
Troponin I (High Sensitivity): 14 ng/L (ref <18)
Troponin I (High Sensitivity): 13 ng/L (ref <18)
Troponin I (High Sensitivity): 14 ng/L (ref <18)

## 2019-01-16 MED ORDER — MELOXICAM 7.5 MG PO TABS
15.0000 mg | ORAL_TABLET | Freq: Every day | ORAL | Status: DC
  Administered 2019-01-16 – 2019-01-18 (×3): 15 mg via ORAL
  Filled 2019-01-16 (×3): qty 2

## 2019-01-16 MED ORDER — NITROGLYCERIN 0.4 MG SL SUBL
0.4000 mg | SUBLINGUAL_TABLET | SUBLINGUAL | Status: DC | PRN
  Administered 2019-01-17: 0.4 mg via SUBLINGUAL
  Filled 2019-01-16: qty 1

## 2019-01-16 MED ORDER — ATORVASTATIN CALCIUM 20 MG PO TABS
40.0000 mg | ORAL_TABLET | Freq: Every day | ORAL | Status: DC
  Administered 2019-01-16 – 2019-01-17 (×2): 40 mg via ORAL
  Filled 2019-01-16 (×2): qty 2

## 2019-01-16 MED ORDER — ONDANSETRON HCL 4 MG PO TABS: 4.0000 mg | ORAL_TABLET | Freq: Four times a day (QID) | ORAL | Status: DC | PRN

## 2019-01-16 MED ORDER — ENOXAPARIN SODIUM 40 MG/0.4ML ~~LOC~~ SOLN
40.0000 mg | SUBCUTANEOUS | Status: DC
  Administered 2019-01-16 – 2019-01-17 (×2): 40 mg via SUBCUTANEOUS
  Filled 2019-01-16 (×2): qty 0.4

## 2019-01-16 MED ORDER — HYDROCODONE-ACETAMINOPHEN 5-325 MG PO TABS: 1.0000 | ORAL_TABLET | ORAL | Status: DC | PRN

## 2019-01-16 MED ORDER — DEXAMETHASONE SODIUM PHOSPHATE 4 MG/ML IJ SOLN
4.0000 mg | Freq: Two times a day (BID) | INTRAMUSCULAR | Status: DC
  Administered 2019-01-16 – 2019-01-18 (×4): 4 mg via INTRAVENOUS
  Filled 2019-01-16 (×6): qty 1

## 2019-01-16 MED ORDER — CLINDAMYCIN PHOSPHATE 600 MG/50ML IV SOLN
600.0000 mg | Freq: Three times a day (TID) | INTRAVENOUS | Status: DC
  Administered 2019-01-16 – 2019-01-18 (×6): 600 mg via INTRAVENOUS
  Filled 2019-01-16 (×7): qty 50

## 2019-01-16 MED ORDER — SODIUM CHLORIDE 0.9 % IV BOLUS
1000.0000 mL | Freq: Once | INTRAVENOUS | Status: AC
  Administered 2019-01-16: 1000 mL via INTRAVENOUS

## 2019-01-16 MED ORDER — METOPROLOL TARTRATE 25 MG PO TABS
25.0000 mg | ORAL_TABLET | Freq: Two times a day (BID) | ORAL | Status: DC
  Administered 2019-01-16 – 2019-01-18 (×2): 25 mg via ORAL
  Filled 2019-01-16 (×5): qty 1

## 2019-01-16 MED ORDER — IOHEXOL 300 MG/ML  SOLN
75.0000 mL | Freq: Once | INTRAMUSCULAR | Status: AC | PRN
  Administered 2019-01-16: 75 mL via INTRAVENOUS

## 2019-01-16 MED ORDER — ISOSORBIDE MONONITRATE ER 60 MG PO TB24
60.0000 mg | ORAL_TABLET | Freq: Every day | ORAL | Status: DC
  Administered 2019-01-16 – 2019-01-17 (×2): 60 mg via ORAL
  Filled 2019-01-16 (×2): qty 1

## 2019-01-16 MED ORDER — ASPIRIN EC 81 MG PO TBEC
81.0000 mg | DELAYED_RELEASE_TABLET | Freq: Every day | ORAL | Status: DC
  Administered 2019-01-16 – 2019-01-17 (×2): 81 mg via ORAL
  Filled 2019-01-16 (×2): qty 1

## 2019-01-16 MED ORDER — CLINDAMYCIN PHOSPHATE 600 MG/50ML IV SOLN
600.0000 mg | Freq: Once | INTRAVENOUS | Status: AC
  Administered 2019-01-16: 600 mg via INTRAVENOUS
  Filled 2019-01-16 (×2): qty 50

## 2019-01-16 MED ORDER — ACETAMINOPHEN 325 MG PO TABS: 650.0000 mg | ORAL_TABLET | Freq: Four times a day (QID) | ORAL | Status: DC | PRN

## 2019-01-16 MED ORDER — ONDANSETRON HCL 4 MG/2ML IJ SOLN: 4.0000 mg | Freq: Four times a day (QID) | INTRAMUSCULAR | Status: DC | PRN

## 2019-01-16 MED ORDER — SODIUM CHLORIDE 0.9 % IV SOLN
INTRAVENOUS | Status: DC
  Administered 2019-01-16: 18:00:00 via INTRAVENOUS

## 2019-01-16 NOTE — Progress Notes (Signed)
Spoke to ENT Dr. Richardson Landry, who will see the patient tomorrow, patient ultimately need have tooth removed and needs a dentist referral.

## 2019-01-16 NOTE — Progress Notes (Signed)
Patient admitted to 2A 243.  Oriented to room and surroundings. Call bell in reach.

## 2019-01-16 NOTE — ED Provider Notes (Signed)
[facility] Emergency Department Provider Note  ____________________________________________   First MD Initiated Contact with Patient 01/16/19 1323     (approximate)  I have reviewed the triage vital signs and the nursing notes.   HISTORY  Chief Complaint Chest Pain and Facial Swelling    HPI Timothy Galvan is a 62 y.o. male with hypertension, CAD but denies prior stents/heart surgeries he now presents for chest pain and upper lip swelling.  Patient has had lip swelling for approximately 3 days.  He is unsure if he was stung by something.  Denies any difficulties with swallowing.  The pain is severe, constant, worse with trying to eat, nothing makes it better.  He is also had some associated chest pain this been going on for 2 days that is midsternal, nonexertional, constant in nature, nothing makes it better or worse. No fevers.          Past Medical History:  Diagnosis Date  . Blood clot in vein   . Cancer ([place])   . Coronary artery disease   . GERD (gastroesophageal reflux disease)   . Heart disease   . Hypertension   . Pneumonia     Patient Active Problem List   Diagnosis Date Noted  . GERD (gastroesophageal reflux disease) 01/10/2018  . CAD (coronary artery disease) 01/10/2018  . Closed compression fracture of body of L1 vertebra ([place]) 01/10/2018  . HLD (hyperlipidemia) 01/10/2018    Past Surgical History:  Procedure Laterality Date  . MANDIBLE SURGERY    . WRIST SURGERY      Prior to Admission medications   Medication Sig Start Date End Date Taking? Authorizing Provider  acetaminophen (TYLENOL) 325 MG tablet Take 2 tablets (650 mg total) by mouth every 6 (six) hours as needed for mild pain (or Fever >/= 101). 01/13/18   Wieting, Richard, MD  alendronate (FOSAMAX) 70 MG tablet Take 1 tablet (70 mg total) by mouth once a week. Take with a full glass of water on an empty stomach. 01/13/18   Loletha Grayer, MD  aspirin EC 81 MG tablet  Take 81 mg by mouth at bedtime.    [provider]  atorvastatin (LIPITOR) 40 MG tablet Take 40 mg by mouth at bedtime.     [provider]  calcium-vitamin D (OSCAL 500/200 D-3) 500-200 MG-UNIT tablet Take 1 tablet by mouth 2 (two) times daily. 01/13/18 01/13/19  Loletha Grayer, MD  feeding supplement, ENSURE ENLIVE, (ENSURE ENLIVE) LIQD Take 237 mLs by mouth 2 (two) times daily between meals. 01/13/18   Loletha Grayer, MD  Ipratropium-Albuterol (COMBIVENT) 20-100 MCG/ACT AERS respimat Inhale 1 puff into the lungs every 6 (six) hours as needed for wheezing.    [provider]  isosorbide mononitrate (IMDUR) 60 MG 24 hr tablet Take 60 mg by mouth at bedtime.     [provider]  Melatonin 5 MG TABS Take 5 mg by mouth at bedtime.    [provider]  meloxicam (MOBIC) 15 MG tablet Take 1 tablet (15 mg total) by mouth daily. 01/13/18   Loletha Grayer, MD  nitroGLYCERIN (NITROSTAT) 0.4 MG SL tablet Place 0.4 mg under the tongue every 5 (five) minutes as needed for chest pain.    [provider]  omeprazole (PRILOSEC) 20 MG capsule Take 20 mg by mouth at bedtime.     [provider]  oxyCODONE (OXY IR/ROXICODONE) 5 MG immediate release tablet Take 1 tablet (5 mg total) by mouth every 6 (six)  hours as needed for moderate pain. 01/13/18   Loletha Grayer, MD    Allergies Penicillins  No family history on file.  Social History Social History   Tobacco Use  . Smoking status: Current Every Day Smoker    Packs/day: 1.00  . Smokeless tobacco: Never Used  Substance Use Topics  . Alcohol use: Yes    Alcohol/week: 12.0 standard drinks    Types: 12 Cans of beer per week  . Drug use: Yes    Types: Cocaine      Review of Systems Constitutional: No fever/chills Eyes: No visual changes. ENT: No sore throat.  Lip swelling. Cardiovascular: Positive chest pain Respiratory: Denies shortness of breath. Gastrointestinal: No abdominal pain.   No nausea, no vomiting.  No diarrhea.  No constipation. Genitourinary: Negative for dysuria. Musculoskeletal: Negative for back pain. Skin: Negative for rash. Neurological: Negative for headaches, focal weakness or numbness. All other ROS negative ____________________________________________   PHYSICAL EXAM:  VITAL SIGNS: ED Triage Vitals  Enc Vitals Group     BP 01/16/19 1209 108/90     Pulse Rate 01/16/19 1209 94     Resp 01/16/19 1209 16     Temp 01/16/19 1209 98.2 F (36.8 C)     Temp Source 01/16/19 1209 Oral     SpO2 01/16/19 1209 97 %     Weight 01/16/19 1210 133 lb (60.3 kg)     Height 01/16/19 1210 5\' 3"  (1.6 m)     Head Circumference --      Peak Flow --      Pain Score 01/16/19 1210 8     Pain Loc --      Pain Edu? --      Excl. in [place]? --     Constitutional: Alert and oriented. Well appearing and in no acute distress. Eyes: Conjunctivae are normal. EOMI. Head: Atraumatic. Nose: No congestion/rhinnorhea. Mouth/Throat: Mucous membranes are moist.  Oropharynx non-erythematous.  Swollen upper lip that is indurated in nature, poor dentition. Neck: No stridor. Trachea Midline. FROM Cardiovascular: Normal rate, regular rhythm. Grossly normal heart sounds.  Good peripheral circulation. Respiratory: Normal respiratory effort.  No retractions. Lungs CTAB. Gastrointestinal: Soft and nontender. No distention. No abdominal bruits.  Musculoskeletal: No lower extremity tenderness nor edema.  No joint effusions. Neurologic:  Normal speech and language. No gross focal neurologic deficits are appreciated.  Skin:  Skin is warm, dry and intact. No rash noted. Psychiatric: Mood and affect are normal. Speech and behavior are normal. GU: Deferred   ____________________________________________   LABS (all labs ordered are listed, but only abnormal results are displayed)  Labs Reviewed  BASIC METABOLIC PANEL - Abnormal; Notable for the following components:      Result Value    Glucose, Bld 110 (*)    Creatinine, Ser 1.27 (*)    GFR calc non Af Amer 59 (*)    All other components within normal limits  NOVEL CORONAVIRUS, NAA (HOSPITAL ORDER, SEND-OUT TO REF LAB)  CBC  TROPONIN I (HIGH SENSITIVITY)  TROPONIN I (HIGH SENSITIVITY)  HIV ANTIBODY (ROUTINE TESTING W REFLEX)  TROPONIN I (HIGH SENSITIVITY)   ____________________________________________   ED ECG REPORT I, Vanessa [place], the attending physician, personally viewed and interpreted this ECG.  EKG is normal sinus, rate of 87, 1 mm ST elevation in V3.  T wave inversion in 2 3 aVF and V4 through V6.,  Normal intervals.  Flipped T waves in inferior leads new from prior. ____________________________________________  RADIOLOGY Martie Lee  E Arius Harnois, personally viewed and evaluated these images (plain radiographs) as part of my medical decision making, as well as reviewing the written report by the radiologist.  ED MD interpretation: Chest x-ray negative for acute pathology possibly some mild edema  Official radiology report(s): Dg Chest 2 View  Result Date: 01/16/2019 CLINICAL DATA:  Central chest pain over the last 2 days. Facial swelling. EXAM: CHEST - 2 VIEW COMPARISON:  04/01/2017 FINDINGS: Heart size is normal. Aortic atherosclerosis. Upper lobe emphysema, particularly on the right. Crowding of the markings below that. Question mild interstitial edema. No infiltrate, collapse or effusion. IMPRESSION: Emphysema. Aortic atherosclerosis. Question early interstitial edema. Electronically Signed   By: Nelson Chimes M.D.   On: 01/16/2019 14:55   Ct Maxillofacial W Contrast  Result Date: 01/16/2019 CLINICAL DATA:  Upper lip and left cheek swelling for 2 days. Evaluation for abscess. EXAM: CT MAXILLOFACIAL WITH CONTRAST TECHNIQUE: Multidetector CT imaging of the maxillofacial structures was performed with intravenous contrast. Multiplanar CT image reconstructions were also generated. CONTRAST:  81mL OMNIPAQUE IOHEXOL 300  MG/ML  SOLN COMPARISON:  None. FINDINGS: Osseous: No acute fracture. Left maxillary sinus deformity suggesting remote trauma. Multiple dental caries. Prominent periapical lucency associated with the left lateral maxillary incisor with a 6 mm component extending anterior to the bone and with associated overlying soft tissue swelling. Orbits: Unremarkable. Sinuses: Paranasal sinuses and mastoid air cells are clear. Soft tissues: Soft tissue swelling anterior to the maxilla, left greater than right. 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 Limited intracranial: Carotid siphon atherosclerosis. Reconstitution of the right supraclinoid ICA. IMPRESSION: 1. Periapical abscess involving the left lateral maxillary incisor with overlying soft tissue swelling/cellulitis. 2. 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 Electronically Signed   By: Logan Bores M.D.   On: 01/16/2019 14:41    ____________________________________________   PROCEDURES  Procedure(s) performed (including Critical Care):  Procedures   ____________________________________________   INITIAL IMPRESSION / ASSESSMENT AND PLAN / ED COURSE    Timothy Galvan was evaluated in Emergency Department on 01/16/2019 for the symptoms described in the history of present illness. He was evaluated in the context of the global COVID-19 pandemic, which necessitated consideration that the patient might be at risk for infection with the SARS-CoV-2 virus that causes COVID-19. Institutional protocols and algorithms that pertain to the evaluation of patients at risk for COVID-19 are in a state of rapid change based on information released by regulatory bodies including the CDC and federal and state organizations. These policies and algorithms were followed during the patient's care in the ED.     Patient is presenting with 2 separate complaints.  For patient's chest pain consider secondary to ACS.  Will get cardiac markers to  evaluate.  Patient does have new flipped T waves in his inferior leads.  Low suspicion for dissection or PE.  Will get coronavirus testing  For the swelling in the upper lip this is most concerning for cellulitis or abscess.  Will get CT face to further evaluate for need of I&D.  Patient is not on ACE inhibitor and it does not appear to be angioedema.  No airway involvement to make me think this is anaphylaxis to something.      ____________________________________________  Creatinine slightly elevated at 1.27 we will give 1 L fluid  Initial troponin14 and with new inferior T waves will discuss with [facility] for [facility] for repeat troponins and consideration of further evaluation  Discussed case with the hospitalist team before  the CT scan have resulted.  Started patient on clindamycin to cover for cellulitis.  CT scan showed evidence of periapical abscess.  Discussed with hospital team and they were going to discuss with ENT.    FINAL CLINICAL IMPRESSION(S) / ED DIAGNOSES   Final diagnoses:  Dental abscess  Facial cellulitis  Other chest pain      MEDICATIONS GIVEN DURING THIS VISIT:  Medications  acetaminophen (TYLENOL) tablet 650 mg (has no administration in time range)  aspirin EC tablet 81 mg (has no administration in time range)  meloxicam (MOBIC) tablet 15 mg (has no administration in time range)  atorvastatin (LIPITOR) tablet 40 mg (has no administration in time range)  isosorbide mononitrate (IMDUR) 24 hr tablet 60 mg (has no administration in time range)  nitroGLYCERIN (NITROSTAT) SL tablet 0.4 mg (has no administration in time range)  0.9 %  sodium chloride infusion (has no administration in time range)  HYDROcodone-acetaminophen (NORCO/VICODIN) 5-325 MG per tablet 1 tablet (has no administration in time range)  ondansetron (ZOFRAN) tablet 4 mg (has no administration in time range)    Or  ondansetron (ZOFRAN) injection 4 mg (has no administration in time range)   enoxaparin (LOVENOX) injection 40 mg (has no administration in time range)  metoprolol tartrate (LOPRESSOR) tablet 25 mg (has no administration in time range)  clindamycin (CLEOCIN) IVPB 600 mg (has no administration in time range)  dexamethasone (DECADRON) injection 4 mg (has no administration in time range)  sodium chloride 0.9 % bolus 1,000 mL (0 mLs Intravenous Stopped 01/16/19 1527)  clindamycin (CLEOCIN) IVPB 600 mg (0 mg Intravenous Stopped 01/16/19 1527)  iohexol (OMNIPAQUE) 300 MG/ML solution 75 mL (75 mLs Intravenous Contrast Given 01/16/19 1419)     ED Discharge Orders    None       Note:  This document was prepared using Dragon voice recognition software and may include unintentional dictation errors.   Vanessa [place], MD 01/16/19 [phone]

## 2019-01-16 NOTE — H&P (Signed)
[facility] NAME: Timothy Galvan    MR#:  [id number]  DATE OF BIRTH:  Jul 18, 1955  DATE OF ADMISSION:  01/16/2019  PRIMARY CARE PHYSICIAN: Petra Kuba, MD   REQUESTING/REFERRING PHYSICIAN: Dr. Corky Downs  CHIEF COMPLAINT: Upper lip swelling, chest pain   Chief Complaint  Patient presents with  . Chest Pain  . Facial Swelling    HISTORY OF PRESENT ILLNESS:  Timothy Galvan  is a 63 y.o. male with a known history of essential hypertension, GERD, CAD, sent because of upper lip swelling started for last 2 to 3 days, patient started on clindamycin as an outpatient but patient has significant swelling, pain of whole upper lip and pain radiating all the way to left side.  Patient also complained of chest pain he says chest pain mainly on the right side and also says that his back also is hurting.  Unable to describe if he had any shortness of breath.  Says he has some cough but no fever.  No exertional dyspnea.  No aggravating or relieving factors for chest pain.  No nausea or vomiting or sweating associated with chest pain.  Patient appears comfortable and his main complaint today is upper sleep swelling.  PAST MEDICAL HISTORY:   Past Medical History:  Diagnosis Date  . Asthma   . Blood clot in vein   . Cancer ([place])   . Coronary artery disease   . GERD (gastroesophageal reflux disease)   . Heart disease   . Hypertension   . Pneumonia     PAST SURGICAL HISTOIRY:   Past Surgical History:  Procedure Laterality Date  . MANDIBLE SURGERY    . WRIST SURGERY      SOCIAL HISTORY:   Social History   Tobacco Use  . Smoking status: Current Every Day Smoker    Packs/day: 1.00  . Smokeless tobacco: Never Used  Substance Use Topics  . Alcohol use: Yes    Alcohol/week: 12.0 standard drinks    Types: 12 Cans of beer per week    FAMILY HISTORY:  No family history on file.  DRUG ALLERGIES:   Allergies  Allergen  Reactions  . Penicillins     Has patient had a PCN reaction causing immediate rash, facial/tongue/throat swelling, SOB or lightheadedness with hypotension: Unknown Has patient had a PCN reaction causing severe rash involving mucus membranes or skin necrosis: Unknown Has patient had a PCN reaction that required hospitalization: Unknown Has patient had a PCN reaction occurring within the last 10 years: Unknown If all of the above answers are "NO", then may proceed with Cephalosporin use.     REVIEW OF SYSTEMS:  CONSTITUTIONAL: No fever, fatigue or weakness.  EYES: No blurred or double vision.  EARS, NOSE, AND THROAT: No tinnitus or ear pain.  Patient has swelling of upper lip, tenderness on the left side of face, upper lip is very tender. RESPIRATORY: No cough, shortness of breath, wheezing or hemoptysis.  CARDIOVASCULAR: No chest pain, orthopnea, edema.  GASTROINTESTINAL: No nausea, vomiting, diarrhea or abdominal pain.  GENITOURINARY: No dysuria, hematuria.  ENDOCRINE: No polyuria, nocturia,  HEMATOLOGY: No anemia, easy bruising or bleeding SKIN: No rash or lesion. MUSCULOSKELETAL: No joint pain or arthritis.   NEUROLOGIC: No tingling, numbness, weakness.  PSYCHIATRY: No anxiety or depression.   MEDICATIONS AT HOME:   Prior to Admission medications   Medication Sig Start Date End Date Taking? Authorizing Provider  acetaminophen (TYLENOL) 325 MG tablet Take  2 tablets (650 mg total) by mouth every 6 (six) hours as needed for mild pain (or Fever >/= 101). 01/13/18  Yes Wieting, Richard, MD  aspirin EC 81 MG tablet Take 81 mg by mouth at bedtime.   Yes [provider]  atorvastatin (LIPITOR) 40 MG tablet Take 40 mg by mouth at bedtime.    Yes [provider]  gabapentin (NEURONTIN) 300 MG capsule Take 600 mg by mouth 3 (three) times daily. 11/21/18  Yes [provider]  Ipratropium-Albuterol (COMBIVENT) 20-100 MCG/ACT AERS respimat Inhale 1 puff into the lungs  every 6 (six) hours as needed for wheezing.   Yes [provider]  isosorbide mononitrate (IMDUR) 60 MG 24 hr tablet Take 60 mg by mouth at bedtime.    Yes [provider]  latanoprost (XALATAN) 0.005 % ophthalmic solution Place 1 drop into the right eye at bedtime. 01/09/19  Yes [provider]  Melatonin 5 MG TABS Take 5 mg by mouth at bedtime.   Yes [provider]  nitroGLYCERIN (NITROSTAT) 0.4 MG SL tablet Place 0.4 mg under the tongue every 5 (five) minutes as needed for chest pain.   Yes [provider]  omeprazole (PRILOSEC) 20 MG capsule Take 20 mg by mouth at bedtime.    Yes [provider]  alendronate (FOSAMAX) 70 MG tablet Take 1 tablet (70 mg total) by mouth once a week. Take with a full glass of water on an empty stomach. Patient not taking: Reported on 01/16/2019 01/13/18   Loletha Grayer, MD  meloxicam (MOBIC) 15 MG tablet Take 1 tablet (15 mg total) by mouth daily. Patient not taking: Reported on 01/16/2019 01/13/18   Loletha Grayer, MD      VITAL SIGNS:  Blood pressure (!) 154/95, pulse 60, temperature 98.2 F (36.8 C), temperature source Oral, resp. rate 14, height 5\' 3"  (1.6 m), weight 60.3 kg, SpO2 98 %.  PHYSICAL EXAMINATION:  GENERAL:  63 y.o.-year-old patient lying in the bed with no acute distress.  EYES: Pupils equal, round, reactive to light and accommodation. No scleral icterus. Extraocular muscles intact.  HEENT: Head atraumatic, normocephalic.  Patient has swelling of upper lip very tender to palpation and radiating to the left jaw.,  Left maxillary area.  NECK:  Supple, no jugular venous distention. No thyroid enlargement, no tenderness.  LUNGS: Normal breath sounds bilaterally, no wheezing, rales,rhonchi or crepitation. No use of accessory muscles of respiration.  CARDIOVASCULAR: S1, S2 normal. No murmurs, rubs, or gallops.  ABDOMEN: Soft, nontender, nondistended. Bowel sounds present. No organomegaly or mass.   EXTREMITIES: No pedal edema, cyanosis, or clubbing.  NEUROLOGIC: Cranial nerves II through XII are intact. Muscle strength 5/5 in all extremities. Sensation intact. Gait not checked.  PSYCHIATRIC: The patient is alert and oriented x 3.  SKIN: No obvious rash, lesion, or ulcer.   LABORATORY PANEL:   CBC Recent Labs  Lab 01/16/19 1217  WBC 7.9  HGB 14.6  HCT 43.6  PLT 248   ------------------------------------------------------------------------------------------------------------------  Chemistries  Recent Labs  Lab 01/16/19 1217  NA 139  K 3.9  CL 107  CO2 24  GLUCOSE 110*  BUN 19  CREATININE 1.27*  CALCIUM 9.2   ------------------------------------------------------------------------------------------------------------------  Cardiac Enzymes No results for input(s): TROPONINI in the last 168 hours. ------------------------------------------------------------------------------------------------------------------  RADIOLOGY:  Dg Chest 2 View  Result Date: 01/16/2019 CLINICAL DATA:  Central chest pain over the last 2 days. Facial swelling. EXAM: CHEST - 2 VIEW COMPARISON:  04/01/2017 FINDINGS: Heart size  is normal. Aortic atherosclerosis. Upper lobe emphysema, particularly on the right. Crowding of the markings below that. Question mild interstitial edema. No infiltrate, collapse or effusion. IMPRESSION: Emphysema. Aortic atherosclerosis. Question early interstitial edema. Electronically Signed   By: Nelson Chimes M.D.   On: 01/16/2019 14:55   Ct Maxillofacial W Contrast  Result Date: 01/16/2019 CLINICAL DATA:  Upper lip and left cheek swelling for 2 days. Evaluation for abscess. EXAM: CT MAXILLOFACIAL WITH CONTRAST TECHNIQUE: Multidetector CT imaging of the maxillofacial structures was performed with intravenous contrast. Multiplanar CT image reconstructions were also generated. CONTRAST:  11mL OMNIPAQUE IOHEXOL 300 MG/ML  SOLN COMPARISON:  None. FINDINGS: Osseous: No acute  fracture. Left maxillary sinus deformity suggesting remote trauma. Multiple dental caries. Prominent periapical lucency associated with the left lateral maxillary incisor with a 6 mm component extending anterior to the bone and with associated overlying soft tissue swelling. Orbits: Unremarkable. Sinuses: Paranasal sinuses and mastoid air cells are clear. Soft tissues: Soft tissue swelling anterior to the maxilla, left greater than right. 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 Limited intracranial: Carotid siphon atherosclerosis. Reconstitution of the right supraclinoid ICA. IMPRESSION: 1. Periapical abscess involving the left lateral maxillary incisor with overlying soft tissue swelling/cellulitis. 2. 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 Electronically Signed   By: Logan Bores M.D.   On: 01/16/2019 14:41    EKG:   Orders placed or performed during the hospital encounter of 01/16/19  . EKG 12-Lead  . EKG 12-Lead  . ED EKG  . ED EKG  normal sinus rhythm with 87 bpm, LVH, T wave inversions in lateral leads V4, V5, V6.  IMPRESSION AND PLAN:   63 year old male with history of CAD, balloon angioplasty in 2017 and follows up with [facility] cardiology, history of GERD, substance abuse disorder, depression, essential hypertension, chronic pain syndrome, GERD comes in because of upper lip swelling, chest pain. #1 ,atypical chest pain with normal range high-sensitivity troponins, cycle high-sensitivity troponins, repeat EKG, monitor on telemetry, continue aspirin, nitroglycerin,, small dose beta-blockers, because of history of CAD, can be chronic angina but we will exclude acute cardiac event, continue statins, nitrates, beta-blockers, if troponins are negative patient needs outpatient follow-up with cardiologist at [facility]. 2.  Periapical abscess of left lateral maxillary incisor, patient had a CT maxillofacial area with contrast in the emergency room, continue IV clindamycin along  with Decadron, ENT is consulted. 3.  GERD: Continue PPIs 4.  History of PAD with stable claudication, history of avascular necrosis of femur, follows with PCP. #5 /acute kidney injury secondary to dehydration and not eating, continue gentle hydration.  all the records are reviewed and case discussed with ED provider. Management plans discussed with the patient, family and they are in agreement.  CODE STATUS: Full code  TOTAL TIME TAKING CARE OF THIS PATIENT;74minutes.    Epifanio Lesches M.D on 01/16/2019 at 4:09 PM  Between 7am to 6pm - Pager - [phone]  After 6pm go to [email or web address] - password [email or web address] [facility]  Office  [phone]  CC: Primary care physician; Petra Kuba, MD  Note: This dictation was prepared with Dragon dictation along with smaller phrase technology. Any transcriptional errors that result from this process are unintentional.

## 2019-01-16 NOTE — ED Triage Notes (Signed)
Patient presents to the ED with centralized chest pain x 2 days.  Patient states, "I thought it was acid reflux, so I took those pills but it didn't work."  Patient is also complaining of lower back pain.  Patient reports chronic lower back pain.  Patient reports swelling to upper lip and left cheek x 2 days.  Patient is speaking in full sentences with no obvious distress at this time.

## 2019-01-16 NOTE — ED Notes (Signed)
Floor unable to take report at this time.

## 2019-01-16 NOTE — ED Notes (Signed)
ED TO INPATIENT HANDOFF REPORT  ED Nurse Name and Phone #: Dionisio David Name/Age/Gender [name] 63 y.o. male Room/Bed: ED01A/ED01A  Code Status   Code Status: Full Code  Home/SNF/Other Home Patient oriented to: self, place, time and situation Is this baseline? Yes   Triage Complete: Triage complete  Chief Complaint chest and back pain  Triage Note Patient presents to the ED with centralized chest pain x 2 days.  Patient states, "I thought it was acid reflux, so I took those pills but it didn't work."  Patient is also complaining of lower back pain.  Patient reports chronic lower back pain.  Patient reports swelling to upper lip and left cheek x 2 days.  Patient is speaking in full sentences with no obvious distress at this time.     Allergies Allergies  Allergen Reactions  . Penicillins     Has patient had a PCN reaction causing immediate rash, facial/tongue/throat swelling, SOB or lightheadedness with hypotension: Unknown Has patient had a PCN reaction causing severe rash involving mucus membranes or skin necrosis: Unknown Has patient had a PCN reaction that required hospitalization: Unknown Has patient had a PCN reaction occurring within the last 10 years: Unknown If all of the above answers are "NO", then may proceed with Cephalosporin use.     Level of Care/Admitting Diagnosis ED Disposition    ED Disposition Condition [facility] Area: [place] [[id number]]  Level of Care: Telemetry [5]  Covid Evaluation: Asymptomatic Screening Protocol (No Symptoms)  Diagnosis: Chest pain [321224]  Admitting Physician: Epifanio Lesches [[id number]]  Attending Physician: Epifanio Lesches [id number]  PT Class (Do Not Modify): Observation [104]  PT Acc Code (Do Not Modify): Observation [10022]       B Medical/Surgery History Past Medical History:  Diagnosis Date  . Asthma   . Blood clot in vein   . Cancer ([place])   . Coronary artery  disease   . GERD (gastroesophageal reflux disease)   . Heart disease   . Hypertension   . Pneumonia    Past Surgical History:  Procedure Laterality Date  . MANDIBLE SURGERY    . WRIST SURGERY       A IV Location/Drains/Wounds Patient Lines/Drains/Airways Status   Active Line/Drains/Airways    Name:   Placement date:   Placement time:   Site:   Days:   Peripheral IV 01/10/18 Left Antecubital   01/10/18    1912    Antecubital   371   Peripheral IV 01/16/19 Right Antecubital   01/16/19    1406    Antecubital   less than 1          Intake/Output Last 24 hours No intake or output data in the 24 hours ending 01/16/19 1455  Labs/Imaging Results for orders placed or performed during the hospital encounter of 01/16/19 (from the past 48 hour(s))  Basic metabolic panel     Status: Abnormal   Collection Time: 01/16/19 12:17 PM  Result Value Ref Range   Sodium 139 135 - 145 mmol/L   Potassium 3.9 3.5 - 5.1 mmol/L   Chloride 107 98 - 111 mmol/L   CO2 24 22 - 32 mmol/L   Glucose, Bld 110 (H) 70 - 99 mg/dL   BUN 19 8 - 23 mg/dL   Creatinine, Ser 1.27 (H) 0.61 - 1.24 mg/dL   Calcium 9.2 8.9 - 10.3 mg/dL   GFR calc non Af Amer 59 (L) >60 mL/min  GFR calc Af Amer >60 >60 mL/min   Anion gap 8 5 - 15    Comment: Performed at [facility], [address] [place], [place] [address]  CBC     Status: None   Collection Time: 01/16/19 12:17 PM  Result Value Ref Range   WBC 7.9 4.0 - 10.5 K/uL   RBC 4.78 4.22 - 5.81 MIL/uL   Hemoglobin 14.6 13.0 - 17.0 g/dL   HCT 43.6 39.0 - 52.0 %   MCV 91.2 80.0 - 100.0 fL   MCH 30.5 26.0 - 34.0 pg   MCHC 33.5 30.0 - 36.0 g/dL   RDW 13.8 11.5 - 15.5 %   Platelets 248 150 - 400 K/uL   nRBC 0.0 0.0 - 0.2 %    Comment: Performed at [facility], [place], [place] [address]  Troponin I (High Sensitivity)     Status: None   Collection Time: 01/16/19 12:17 PM  Result Value Ref Range   Troponin I (High Sensitivity) 14  <18 ng/L    Comment: (NOTE) Elevated high sensitivity troponin I (hsTnI) values and significant  changes across serial measurements may suggest ACS but many other  chronic and acute conditions are known to elevate hsTnI results.  Refer to the "Links" section for chest pain algorithms and additional  guidance. Performed at [facility], [place], [place] [address]   Troponin I (High Sensitivity)     Status: None   Collection Time: 01/16/19  2:06 PM  Result Value Ref Range   Troponin I (High Sensitivity) 13 <18 ng/L    Comment: (NOTE) Elevated high sensitivity troponin I (hsTnI) values and significant  changes across serial measurements may suggest ACS but many other  chronic and acute conditions are known to elevate hsTnI results.  Refer to the "Links" section for chest pain algorithms and additional  guidance. Performed at [facility], [address], [place], [place] [address]    Ct Maxillofacial W Contrast  Result Date: 01/16/2019 CLINICAL DATA:  Upper lip and left cheek swelling for 2 days. Evaluation for abscess. EXAM: CT MAXILLOFACIAL WITH CONTRAST TECHNIQUE: Multidetector CT imaging of the maxillofacial structures was performed with intravenous contrast. Multiplanar CT image reconstructions were also generated. CONTRAST:  53mL OMNIPAQUE IOHEXOL 300 MG/ML  SOLN COMPARISON:  None. FINDINGS: Osseous: No acute fracture. Left maxillary sinus deformity suggesting remote trauma. Multiple dental caries. Prominent periapical lucency associated with the left lateral maxillary incisor with a 6 mm component extending anterior to the bone and with associated overlying soft tissue swelling. Orbits: Unremarkable. Sinuses: Paranasal sinuses and mastoid air cells are clear. Soft tissues: Soft tissue swelling anterior to the maxilla, left greater than right. 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 Limited intracranial: Carotid siphon atherosclerosis. Reconstitution of the  right supraclinoid ICA. IMPRESSION: 1. Periapical abscess involving the left lateral maxillary incisor with overlying soft tissue swelling/cellulitis. 2. 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 Electronically Signed   By: Logan Bores M.D.   On: 01/16/2019 14:41    Pending Labs Unresulted Labs (From admission, onward)    Start     Ordered   01/23/19 0500  Creatinine, serum  (enoxaparin (LOVENOX)    CrCl >/= 30 ml/min)  Weekly,   STAT    Comments: while on enoxaparin therapy    01/16/19 1454   01/17/19 3532  Basic metabolic panel  Tomorrow morning,   STAT     01/16/19 1454  01/17/19 0500  CBC  Tomorrow morning,   STAT     01/16/19 1454   01/17/19 0500  Lipid panel  Tomorrow morning,   STAT     01/16/19 1454   01/16/19 1449  Troponin I (High Sensitivity)  STAT Now then every 2 hours,   STAT    Question:  Indication  Answer:  Suspect ACS   01/16/19 1454   01/16/19 1447  HIV antibody (Routine Testing)  Once,   STAT     01/16/19 1454   01/16/19 1447  CBC  (enoxaparin (LOVENOX)    CrCl >/= 30 ml/min)  Once,   STAT    Comments: Baseline for enoxaparin therapy IF NOT ALREADY DRAWN.  Notify MD if PLT < 100 K.    01/16/19 1454   01/16/19 1447  Creatinine, serum  (enoxaparin (LOVENOX)    CrCl >/= 30 ml/min)  Once,   STAT    Comments: Baseline for enoxaparin therapy IF NOT ALREADY DRAWN.    01/16/19 1454   01/16/19 1403  Novel Coronavirus,NAA,(SEND-OUT TO REF LAB - TAT 24-48 hrs); Hosp Order  (Asymptomatic Patients Labs)  Once,   STAT    Question:  Rule Out  Answer:  Yes   01/16/19 1402          Vitals/Pain Today's Vitals   01/16/19 1209 01/16/19 1210 01/16/19 1400  BP: 108/90  (!) 154/98  Pulse: 94  60  Resp: 16  16  Temp: 98.2 F (36.8 C)    TempSrc: Oral    SpO2: 97%  98%  Weight:  60.3 kg   Height:  5\' 3"  (1.6 m)   PainSc:  8      Isolation Precautions No active isolations  Medications Medications  clindamycin (CLEOCIN) IVPB 600 mg  (600 mg Intravenous New Bag/Given 01/16/19 1447)  acetaminophen (TYLENOL) tablet 650 mg (has no administration in time range)  aspirin EC tablet 81 mg (has no administration in time range)  meloxicam (MOBIC) tablet 15 mg (has no administration in time range)  atorvastatin (LIPITOR) tablet 40 mg (has no administration in time range)  isosorbide mononitrate (IMDUR) 24 hr tablet 60 mg (has no administration in time range)  nitroGLYCERIN (NITROSTAT) SL tablet 0.4 mg (has no administration in time range)  0.9 %  sodium chloride infusion (has no administration in time range)  HYDROcodone-acetaminophen (NORCO/VICODIN) 5-325 MG per tablet 1-2 tablet (has no administration in time range)  ondansetron (ZOFRAN) tablet 4 mg (has no administration in time range)    Or  ondansetron (ZOFRAN) injection 4 mg (has no administration in time range)  enoxaparin (LOVENOX) injection 40 mg (has no administration in time range)  metoprolol tartrate (LOPRESSOR) tablet 25 mg (has no administration in time range)  clindamycin (CLEOCIN) IVPB 600 mg (has no administration in time range)  dexamethasone (DECADRON) injection 4 mg (has no administration in time range)  sodium chloride 0.9 % bolus 1,000 mL (1,000 mLs Intravenous New Bag/Given 01/16/19 1408)  iohexol (OMNIPAQUE) 300 MG/ML solution 75 mL (75 mLs Intravenous Contrast Given 01/16/19 1419)    Mobility walks Low fall risk   Focused Assessments Cardiac Assessment Handoff:    Lab Results  Component Value Date   CKTOTAL 171 02/05/2014   CKMB 4.9 (H) 02/05/2014   TROPONINI <0.03 01/10/2018   No results found for: DDIMER Does the Patient currently have chest pain? No     R Recommendations: See Admitting Provider Note  Report given to:   Additional Notes:

## 2019-01-17 DIAGNOSIS — R079 Chest pain, unspecified: Secondary | ICD-10-CM | POA: Diagnosis not present

## 2019-01-17 LAB — CBC
HCT: 38.1 % — ABNORMAL LOW (ref 39.0–52.0)
Hemoglobin: 12.4 g/dL — ABNORMAL LOW (ref 13.0–17.0)
MCH: 30 pg (ref 26.0–34.0)
MCHC: 32.5 g/dL (ref 30.0–36.0)
MCV: 92.3 fL (ref 80.0–100.0)
Platelets: 214 10*3/uL (ref 150–400)
RBC: 4.13 MIL/uL — ABNORMAL LOW (ref 4.22–5.81)
RDW: 13.8 % (ref 11.5–15.5)
WBC: 5.7 10*3/uL (ref 4.0–10.5)
nRBC: 0 % (ref 0.0–0.2)

## 2019-01-17 LAB — BASIC METABOLIC PANEL
Anion gap: 6 (ref 5–15)
BUN: 19 mg/dL (ref 8–23)
CO2: 21 mmol/L — ABNORMAL LOW (ref 22–32)
Calcium: 8.6 mg/dL — ABNORMAL LOW (ref 8.9–10.3)
Chloride: 108 mmol/L (ref 98–111)
Creatinine, Ser: 1.11 mg/dL (ref 0.61–1.24)
GFR calc Af Amer: >60 mL/min (ref >60)
GFR calc non Af Amer: >60 mL/min (ref >60)
Glucose, Bld: 249 mg/dL — ABNORMAL HIGH (ref 70–99)
Potassium: 4.2 mmol/L (ref 3.5–5.1)
Sodium: 135 mmol/L (ref 135–145)

## 2019-01-17 LAB — LIPID PANEL
Cholesterol: 177 mg/dL (ref 0–200)
HDL: 58 mg/dL (ref >40)
LDL Cholesterol: 94 mg/dL (ref 0–99)
Total CHOL/HDL Ratio: 3.1 RATIO
Triglycerides: 125 mg/dL (ref <150)
VLDL: 25 mg/dL (ref 0–40)

## 2019-01-17 LAB — NOVEL CORONAVIRUS, NAA (HOSP ORDER, SEND-OUT TO REF LAB; TAT 18-24 HRS): SARS-CoV-2, NAA: NOT DETECTED

## 2019-01-17 LAB — GLUCOSE, CAPILLARY: Glucose-Capillary: 178 mg/dL — ABNORMAL HIGH (ref 70–99)

## 2019-01-17 LAB — HIV ANTIBODY (ROUTINE TESTING W REFLEX): HIV Screen 4th Generation wRfx: NONREACTIVE

## 2019-01-17 NOTE — Progress Notes (Signed)
[facility] [place] [facility] NAME: Timothy Galvan    MR#:  [id number]  DATE OF BIRTH:  Apr 26, 1956  SUBJECTIVE:  CHIEF COMPLAINT:   Chief Complaint  Patient presents with  . Chest Pain  . Facial Swelling   Came with some chest pain and facial swelling with tooth infection.  Swelling and pain in the tooth is little bit better today.  Chest pain is still intermittent.  REVIEW OF SYSTEMS:  CONSTITUTIONAL: No fever, fatigue or weakness.  EYES: No blurred or double vision.  EARS, NOSE, AND THROAT: No tinnitus or ear pain.  RESPIRATORY: No cough, shortness of breath, wheezing or hemoptysis.  CARDIOVASCULAR: No chest pain, orthopnea, edema.  GASTROINTESTINAL: No nausea, vomiting, diarrhea or abdominal pain.  GENITOURINARY: No dysuria, hematuria.  ENDOCRINE: No polyuria, nocturia,  HEMATOLOGY: No anemia, easy bruising or bleeding SKIN: No rash or lesion. MUSCULOSKELETAL: No joint pain or arthritis.   NEUROLOGIC: No tingling, numbness, weakness.  PSYCHIATRY: No anxiety or depression.   ROS  DRUG ALLERGIES:   Allergies  Allergen Reactions  . Penicillins     Has patient had a PCN reaction causing immediate rash, facial/tongue/throat swelling, SOB or lightheadedness with hypotension: Unknown Has patient had a PCN reaction causing severe rash involving mucus membranes or skin necrosis: Unknown Has patient had a PCN reaction that required hospitalization: Unknown Has patient had a PCN reaction occurring within the last 10 years: Unknown If all of the above answers are "NO", then may proceed with Cephalosporin use.     VITALS:  Blood pressure (!) 145/83, pulse 62, temperature (!) 97.5 F (36.4 C), temperature source Oral, resp. rate 17, height 5\' 3"  (1.6 m), weight 57.4 kg, SpO2 98 %.  PHYSICAL EXAMINATION:  GENERAL:  63 y.o.-year-old patient lying in the bed with no acute distress.  EYES: Pupils equal, round, reactive to light and accommodation. No  scleral icterus. Extraocular muscles intact.  HEENT: Head atraumatic, normocephalic.  Broken left upper incisor with some infection on the gingiva near the tooth.  NECK:  Supple, no jugular venous distention. No thyroid enlargement, no tenderness.  LUNGS: Normal breath sounds bilaterally, no wheezing, rales,rhonchi or crepitation. No use of accessory muscles of respiration.  CARDIOVASCULAR: S1, S2 normal. No murmurs, rubs, or gallops.  ABDOMEN: Soft, nontender, nondistended. Bowel sounds present. No organomegaly or mass.  EXTREMITIES: No pedal edema, cyanosis, or clubbing.  NEUROLOGIC: Cranial nerves II through XII are intact. Muscle strength 5/5 in all extremities. Sensation intact. Gait not checked.  PSYCHIATRIC: The patient is alert and oriented x 3.  SKIN: No obvious rash, lesion, or ulcer.   Physical Exam LABORATORY PANEL:   CBC Recent Labs  Lab 01/17/19 0543  WBC 5.7  HGB 12.4*  HCT 38.1*  PLT 214   ------------------------------------------------------------------------------------------------------------------  Chemistries  Recent Labs  Lab 01/17/19 0543  NA 135  K 4.2  CL 108  CO2 21*  GLUCOSE 249*  BUN 19  CREATININE 1.11  CALCIUM 8.6*   ------------------------------------------------------------------------------------------------------------------  Cardiac Enzymes No results for input(s): TROPONINI in the last 168 hours. ------------------------------------------------------------------------------------------------------------------  RADIOLOGY:  Dg Chest 2 View  Result Date: 01/16/2019 CLINICAL DATA:  Central chest pain over the last 2 days. Facial swelling. EXAM: CHEST - 2 VIEW COMPARISON:  04/01/2017 FINDINGS: Heart size is normal. Aortic atherosclerosis. Upper lobe emphysema, particularly on the right. Crowding of the markings below that. Question mild interstitial edema. No infiltrate, collapse or effusion. IMPRESSION: Emphysema. Aortic atherosclerosis.  Question early interstitial edema.  Electronically Signed   By: Nelson Chimes M.D.   On: 01/16/2019 14:55   Ct Maxillofacial W Contrast  Result Date: 01/16/2019 CLINICAL DATA:  Upper lip and left cheek swelling for 2 days. Evaluation for abscess. EXAM: CT MAXILLOFACIAL WITH CONTRAST TECHNIQUE: Multidetector CT imaging of the maxillofacial structures was performed with intravenous contrast. Multiplanar CT image reconstructions were also generated. CONTRAST:  43mL OMNIPAQUE IOHEXOL 300 MG/ML  SOLN COMPARISON:  None. FINDINGS: Osseous: No acute fracture. Left maxillary sinus deformity suggesting remote trauma. Multiple dental caries. Prominent periapical lucency associated with the left lateral maxillary incisor with a 6 mm component extending anterior to the bone and with associated overlying soft tissue swelling. Orbits: Unremarkable. Sinuses: Paranasal sinuses and mastoid air cells are clear. Soft tissues: Soft tissue swelling anterior to the maxilla, left greater than right. 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 Limited intracranial: Carotid siphon atherosclerosis. Reconstitution of the right supraclinoid ICA. IMPRESSION: 1. Periapical abscess involving the left lateral maxillary incisor with overlying soft tissue swelling/cellulitis. 2. 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 Electronically Signed   By: Logan Bores M.D.   On: 01/16/2019 14:41    ASSESSMENT AND PLAN:   Active Problems:   Chest pain   63 year old male with history of CAD, balloon angioplasty in 2017 and follows up with [facility] cardiology, history of GERD, substance abuse disorder, depression, essential hypertension, chronic pain syndrome, GERD comes in because of upper lip swelling, chest pain. #1 ,atypical chest pain with normal range high-sensitivity troponins-checked 3 times,  continue aspirin, nitroglycerin,, small dose beta-blockers, because of history of CAD, can be chronic angina   continue statins,  nitrates, beta-blockers,as troponins are negative patient needs outpatient follow-up with cardiologist at [facility]. 2.  Periapical abscess of left lateral maxillary incisor, patient had a CT maxillofacial area with contrast in the emergency room, continue IV clindamycin along with Decadron, ENT is consulted. Appreciate help and advised to continue IV antibiotic here and changed to oral clindamycin and follow-up with dentist for tooth extraction after discharge. 3.  GERD: Continue PPIs 4.  History of PAD with stable claudication, history of avascular necrosis of femur, follows with PCP. #5 /acute kidney injury secondary to dehydration and not eating, continue gentle hydration. Any function is better.  All the records are reviewed and case discussed with Care Management/Social Workerr. Management plans discussed with the patient, family and they are in agreement.  CODE STATUS: full.  TOTAL TIME TAKING CARE OF THIS PATIENT: 35 minutes.     POSSIBLE D/C IN 1-2 DAYS, DEPENDING ON CLINICAL CONDITION.   Vaughan Basta M.D on 01/17/2019   Between 7am to 6pm - Pager - [phone]  After 6pm go to [email or web address] - password [email or web address] [facility]  Office  [phone]  CC: Primary care physician; Petra Kuba, MD  Note: This dictation was prepared with Dragon dictation along with smaller phrase technology. Any transcriptional errors that result from this process are unintentional.

## 2019-01-17 NOTE — Consult Note (Addendum)
Timothy Galvan, Timothy Galvan [id number] [birth year] Riley Nearing, MD  Reason for Consult: dental abscess Requesting Physician: Vaughan Basta, * Consulting Physician: Riley Nearing, MD  HPI: This 63 y.o. year old male was admitted on 01/16/2019  with a known history of essential hypertension, GERD, CAD, sent because of upper lip swelling started for last 2 to 3 days, patient started on clindamycin as an outpatient but patient has significant swelling, pain of whole upper lip and pain radiating all the way to left side.  Patient also complained of chest pain which admitting physician is assessing.   He feels better today after IV antibiotics with reduction in swelling and improved pain. Medications:  Current Facility-Administered Medications  Medication Dose Route Frequency Provider Last Rate Last Dose  . 0.9 %  sodium chloride infusion   Intravenous Continuous Epifanio Lesches, MD 50 mL/hr at 01/17/19 0800    . acetaminophen (TYLENOL) tablet 650 mg  650 mg Oral Q6H PRN Epifanio Lesches, MD      . aspirin EC tablet 81 mg  81 mg Oral QHS Epifanio Lesches, MD   81 mg at 01/16/19 2235  . atorvastatin (LIPITOR) tablet 40 mg  40 mg Oral QHS Epifanio Lesches, MD   40 mg at 01/16/19 2235  . clindamycin (CLEOCIN) IVPB 600 mg  600 mg Intravenous Q8H Epifanio Lesches, MD 100 mL/hr at 01/17/19 0556 600 mg at 01/17/19 0556  . dexamethasone (DECADRON) injection 4 mg  4 mg Intravenous Q12H Epifanio Lesches, MD   4 mg at 01/17/19 0915  . enoxaparin (LOVENOX) injection 40 mg  40 mg Subcutaneous Q24H Epifanio Lesches, MD   40 mg at 01/16/19 2236  . HYDROcodone-acetaminophen (NORCO/VICODIN) 5-325 MG per tablet 1 tablet  1 tablet Oral Q4H PRN Epifanio Lesches, MD      . isosorbide mononitrate (IMDUR) 24 hr tablet 60 mg  60 mg Oral QHS Epifanio Lesches, MD   60 mg at 01/16/19 2236  . meloxicam (MOBIC) tablet 15 mg  15 mg Oral Daily Epifanio Lesches, MD   15 mg at 01/17/19 0915  .  metoprolol tartrate (LOPRESSOR) tablet 25 mg  25 mg Oral BID Epifanio Lesches, MD   25 mg at 01/16/19 2235  . nitroGLYCERIN (NITROSTAT) SL tablet 0.4 mg  0.4 mg Sublingual Q5 min PRN Epifanio Lesches, MD      . ondansetron (ZOFRAN) tablet 4 mg  4 mg Oral Q6H PRN Epifanio Lesches, MD       Or  . ondansetron (ZOFRAN) injection 4 mg  4 mg Intravenous Q6H PRN Epifanio Lesches, MD      .  Medications Prior to Admission  Medication Sig Dispense Refill  . acetaminophen (TYLENOL) 325 MG tablet Take 2 tablets (650 mg total) by mouth every 6 (six) hours as needed for mild pain (or Fever >/= 101).    Marland Kitchen aspirin EC 81 MG tablet Take 81 mg by mouth at bedtime.    Marland Kitchen atorvastatin (LIPITOR) 40 MG tablet Take 40 mg by mouth at bedtime.     . gabapentin (NEURONTIN) 300 MG capsule Take 600 mg by mouth 3 (three) times daily.    . Ipratropium-Albuterol (COMBIVENT) 20-100 MCG/ACT AERS respimat Inhale 1 puff into the lungs every 6 (six) hours as needed for wheezing.    . isosorbide mononitrate (IMDUR) 60 MG 24 hr tablet Take 60 mg by mouth at bedtime.     Marland Kitchen latanoprost (XALATAN) 0.005 % ophthalmic solution Place 1 drop into the right eye at bedtime.    Marland Kitchen  Melatonin 5 MG TABS Take 5 mg by mouth at bedtime.    . nitroGLYCERIN (NITROSTAT) 0.4 MG SL tablet Place 0.4 mg under the tongue every 5 (five) minutes as needed for chest pain.    Marland Kitchen omeprazole (PRILOSEC) 20 MG capsule Take 20 mg by mouth at bedtime.     Marland Kitchen alendronate (FOSAMAX) 70 MG tablet Take 1 tablet (70 mg total) by mouth once a week. Take with a full glass of water on an empty stomach. (Patient not taking: Reported on 01/16/2019) 4 tablet 0  . meloxicam (MOBIC) 15 MG tablet Take 1 tablet (15 mg total) by mouth daily. (Patient not taking: Reported on 01/16/2019) 30 tablet 0    Allergies:  Allergies  Allergen Reactions  . Penicillins     Has patient had a PCN reaction causing immediate rash, facial/tongue/throat swelling, SOB or lightheadedness  with hypotension: Unknown Has patient had a PCN reaction causing severe rash involving mucus membranes or skin necrosis: Unknown Has patient had a PCN reaction that required hospitalization: Unknown Has patient had a PCN reaction occurring within the last 10 years: Unknown If all of the above answers are "NO", then may proceed with Cephalosporin use.     PMH:  Past Medical History:  Diagnosis Date  . Asthma   . Blood clot in vein   . Cancer ([place])   . Coronary artery disease   . GERD (gastroesophageal reflux disease)   . Heart disease   . Hypertension   . Pneumonia     Fam Hx: No family history on file.  Soc Hx:  Social History   Socioeconomic History  . Marital status: Single    Spouse name: Not on file  . Number of children: Not on file  . Years of education: Not on file  . Highest education level: Not on file  Occupational History  . Not on file  Social Needs  . Financial resource strain: Not on file  . Food insecurity    Worry: Not on file    Inability: Not on file  . Transportation needs    Medical: Not on file    Non-medical: Not on file  Tobacco Use  . Smoking status: Current Every Day Smoker    Packs/day: 1.00  . Smokeless tobacco: Never Used  Substance and Sexual Activity  . Alcohol use: Yes    Alcohol/week: 12.0 standard drinks    Types: 12 Cans of beer per week  . Drug use: Yes    Types: Cocaine  . Sexual activity: Not on file  Lifestyle  . Physical activity    Days per week: Not on file    Minutes per session: Not on file  . Stress: Not on file  Relationships  . Social Herbalist on phone: Not on file    Gets together: Not on file    Attends religious service: Not on file    Active member of club or organization: Not on file    Attends meetings of clubs or organizations: Not on file    Relationship status: Not on file  . Intimate partner violence    Fear of current or ex partner: Not on file    Emotionally abused: Not on file     Physically abused: Not on file    Forced sexual activity: Not on file  Other Topics Concern  . Not on file  Social History Narrative  . Not on file    PSH:  Past Surgical History:  Procedure Laterality Date  . MANDIBLE SURGERY    . WRIST SURGERY    . Procedures since admission: No admission procedures for hospital encounter.  ROS: Review of systems normal other than 12 systems except per HPI.  PHYSICAL EXAM  Vitals: Blood pressure 137/74, pulse (!) 54, temperature (!) 97.5 F (36.4 C), temperature source Oral, resp. rate 18, height 5\' 3"  (1.6 m), weight 57.4 kg, SpO2 98 %.. General: Well-developed, Well-nourished in no acute distress Mood: Mood and affect well adjusted, pleasant and cooperative. Orientation: Grossly alert and oriented. Vocal Quality: No hoarseness. Communicates verbally. head and Face: NCAT. No facial asymmetry. No visible skin lesions. No significant facial scars. No tenderness with sinus percussion. Facial strength normal and symmetric. Ears: External ears with normal landmarks, no lesions. External auditory canals free of infection, cerumen impaction or lesions. Tympanic membranes intact with good landmarks and normal mobility on pneumatic otoscopy. No middle ear effusion. Hearing: Speech reception grossly normal. Nose: External nose normal with midline dorsum and no lesions or deformity. Nasal Cavity reveals essentially midline septum with normal inferior turbinates. No significant mucosal congestion or erythema. Nasal secretions are minimal and clear. No polyps seen on anterior rhinoscopy. Oral Cavity/ Oropharynx: Upper lip with some firmness and tenderness in the midline without fluctuance. Still some swelling but better than yesterday per patient. He has poor dentition with poor dentition  and gingivitis. Left lateral incisor where the abscess is located is broken off. There is some edema of the gingiva over the region of the tooth root of the broken incisor.  Oropharynx including tongue, buccal mucosa, floor of mouth, hard and soft palate, uvula and posterior pharynx free of exudates, erythema or lesions with normal symmetry and hydration.  Indirect Laryngoscopy/Nasopharyngoscopy: Visualization of the larynx, hypopharynx and nasopharynx is not possible in this setting with routine examination. Neck: Supple and symmetric with no palpable masses, tenderness or crepitance. The trachea is midline. Thyroid gland is soft, nontender and symmetric with no masses or enlargement. Parotid and submandibular glands are soft, nontender and symmetric, without masses. Lymphatic: Cervical lymph nodes are without palpable lymphadenopathy or tenderness. Respiratory: Normal respiratory effort without labored breathing. Cardiovascular: Carotid pulse shows regular rate and rhythm Neurologic: Cranial Nerves II through XII are grossly intact. Eyes: Gaze and Ocular Motility are grossly normal. PERRLA. No visible nystagmus.  MEDICAL DECISION MAKING: Data Review:  Results for orders placed or performed during the hospital encounter of 01/16/19 (from the past 48 hour(s))  Basic metabolic panel     Status: Abnormal   Collection Time: 01/16/19 12:17 PM  Result Value Ref Range   Sodium 139 135 - 145 mmol/L   Potassium 3.9 3.5 - 5.1 mmol/L   Chloride 107 98 - 111 mmol/L   CO2 24 22 - 32 mmol/L   Glucose, Bld 110 (H) 70 - 99 mg/dL   BUN 19 8 - 23 mg/dL   Creatinine, Ser 1.27 (H) 0.61 - 1.24 mg/dL   Calcium 9.2 8.9 - 10.3 mg/dL   GFR calc non Af Amer 59 (L) >60 mL/min   GFR calc Af Amer >60 >60 mL/min   Anion gap 8 5 - 15    Comment: Performed at [facility], [address] [place], [place] [address]  CBC     Status: None   Collection Time: 01/16/19 12:17 PM  Result Value Ref Range   WBC 7.9 4.0 - 10.5 K/uL   RBC 4.78 4.22 - 5.81 MIL/uL   Hemoglobin 14.6 13.0 - 17.0 g/dL  HCT 43.6 39.0 - 52.0 %   MCV 91.2 80.0 - 100.0 fL   MCH 30.5 26.0 - 34.0 pg   MCHC  33.5 30.0 - 36.0 g/dL   RDW 13.8 11.5 - 15.5 %   Platelets 248 150 - 400 K/uL   nRBC 0.0 0.0 - 0.2 %    Comment: Performed at [facility], [place], [place] [address]  Troponin I (High Sensitivity)     Status: None   Collection Time: 01/16/19 12:17 PM  Result Value Ref Range   Troponin I (High Sensitivity) 14 <18 ng/L    Comment: (NOTE) Elevated high sensitivity troponin I (hsTnI) values and significant  changes across serial measurements may suggest ACS but many other  chronic and acute conditions are known to elevate hsTnI results.  Refer to the "Links" section for chest pain algorithms and additional  guidance. Performed at [facility], [place], [place] [address]   Troponin I (High Sensitivity)     Status: None   Collection Time: 01/16/19  2:06 PM  Result Value Ref Range   Troponin I (High Sensitivity) 13 <18 ng/L    Comment: (NOTE) Elevated high sensitivity troponin I (hsTnI) values and significant  changes across serial measurements may suggest ACS but many other  chronic and acute conditions are known to elevate hsTnI results.  Refer to the "Links" section for chest pain algorithms and additional  guidance. Performed at [facility], [address] [place], [place] [address]   HIV antibody (Routine Testing)     Status: None   Collection Time: 01/16/19  4:34 PM  Result Value Ref Range   HIV Screen 4th Generation wRfx Non Reactive Non Reactive    Comment: (NOTE) Performed At: [facility] [place], [place] [id number] Rush Farmer MD DX:[id number]   Troponin I (High Sensitivity)     Status: None   Collection Time: 01/16/19  4:34 PM  Result Value Ref Range   Troponin I (High Sensitivity) 14 <18 ng/L    Comment: (NOTE) Elevated high sensitivity troponin I (hsTnI) values and significant  changes across serial measurements may suggest ACS but many other  chronic and acute conditions  are known to elevate hsTnI results.  Refer to the "Links" section for chest pain algorithms and additional  guidance. Performed at [facility], [address] [place], [place] [address]   Basic metabolic panel     Status: Abnormal   Collection Time: 01/17/19  5:43 AM  Result Value Ref Range   Sodium 135 135 - 145 mmol/L   Potassium 4.2 3.5 - 5.1 mmol/L   Chloride 108 98 - 111 mmol/L   CO2 21 (L) 22 - 32 mmol/L   Glucose, Bld 249 (H) 70 - 99 mg/dL   BUN 19 8 - 23 mg/dL   Creatinine, Ser 1.11 0.61 - 1.24 mg/dL   Calcium 8.6 (L) 8.9 - 10.3 mg/dL   GFR calc non Af Amer >60 >60 mL/min   GFR calc Af Amer >60 >60 mL/min   Anion gap 6 5 - 15    Comment: Performed at [facility], [address] [place], [place] [address]  CBC     Status: Abnormal   Collection Time: 01/17/19  5:43 AM  Result Value Ref Range   WBC 5.7 4.0 - 10.5 K/uL   RBC 4.13 (L) 4.22 - 5.81 MIL/uL   Hemoglobin 12.4 (L) 13.0 - 17.0 g/dL   HCT  38.1 (L) 39.0 - 52.0 %   MCV 92.3 80.0 - 100.0 fL   MCH 30.0 26.0 - 34.0 pg   MCHC 32.5 30.0 - 36.0 g/dL   RDW 13.8 11.5 - 15.5 %   Platelets 214 150 - 400 K/uL   nRBC 0.0 0.0 - 0.2 %    Comment: Performed at [facility], [address] [place], [place] [address]  Lipid panel     Status: None   Collection Time: 01/17/19  5:43 AM  Result Value Ref Range   Cholesterol 177 0 - 200 mg/dL   Triglycerides 125 <150 mg/dL   HDL 58 >40 mg/dL   Total CHOL/HDL Ratio 3.1 RATIO   VLDL 25 0 - 40 mg/dL   LDL Cholesterol 94 0 - 99 mg/dL    Comment:        Total Cholesterol/HDL:CHD Risk Coronary Heart Disease Risk Table                     Men   Women  1/2 Average Risk   3.4   3.3  Average Risk       5.0   4.4  2 X Average Risk   9.6   7.1  3 X Average Risk  23.4   11.0        Use the calculated Patient Ratio above and the CHD Risk Table to determine the patient's CHD Risk.        ATP III CLASSIFICATION (LDL):  <100     mg/dL   Optimal   100-129  mg/dL   Near or Above                    Optimal  130-159  mg/dL   Borderline  160-189  mg/dL   High  >190     mg/dL   Very High Performed at [facility], [address] [place], [place] [address]   Glucose, capillary     Status: Abnormal   Collection Time: 01/17/19  8:20 AM  Result Value Ref Range   Glucose-Capillary 178 (H) 70 - 99 mg/dL   Comment 1 Notify RN    Comment 2 Document in Chart   . Dg Chest 2 View  Result Date: 01/16/2019 CLINICAL DATA:  Central chest pain over the last 2 days. Facial swelling. EXAM: CHEST - 2 VIEW COMPARISON:  04/01/2017 FINDINGS: Heart size is normal. Aortic atherosclerosis. Upper lobe emphysema, particularly on the right. Crowding of the markings below that. Question mild interstitial edema. No infiltrate, collapse or effusion. IMPRESSION: Emphysema. Aortic atherosclerosis. Question early interstitial edema. Electronically Signed   By: Nelson Chimes M.D.   On: 01/16/2019 14:55   Ct Maxillofacial W Contrast  Result Date: 01/16/2019 CLINICAL DATA:  Upper lip and left cheek swelling for 2 days. Evaluation for abscess. EXAM: CT MAXILLOFACIAL WITH CONTRAST TECHNIQUE: Multidetector CT imaging of the maxillofacial structures was performed with intravenous contrast. Multiplanar CT image reconstructions were also generated. CONTRAST:  26mL OMNIPAQUE IOHEXOL 300 MG/ML  SOLN COMPARISON:  None. FINDINGS: Osseous: No acute fracture. Left maxillary sinus deformity suggesting remote trauma. Multiple dental caries. Prominent periapical lucency associated with the left lateral maxillary incisor with a 6 mm component extending anterior to the bone and with associated overlying soft tissue swelling. Orbits: Unremarkable. Sinuses: Paranasal sinuses and mastoid air cells are clear. Soft tissues: Soft tissue swelling anterior to the maxilla, left greater than right. 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 Limited intracranial:  Carotid siphon atherosclerosis. Reconstitution  of the right supraclinoid ICA. IMPRESSION: 1. Periapical abscess involving the left lateral maxillary incisor with overlying soft tissue swelling/cellulitis. 2. 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 Electronically Signed   By: Logan Bores M.D.   On: 01/16/2019 14:41  .   ASSESSMENT: Dental periapical abscess with upper lip cellulitis, responding to IV antibiotics.  PLAN: Continue IV antibiotics, until ready for discharge then d/c on Clindamycin. I stressed to patient that he needs to get the abscessed tooth extracted as soon as possible. I see no need for any other surgical intervention but will need dental follow-up ASAP after discharge.    Riley Nearing, MD 01/17/2019 11:21 AM

## 2019-01-17 NOTE — Care Management Obs Status (Signed)
[place] NOTIFICATION   Patient Details  Name: Timothy Galvan MRN: [id number] Date of Birth: October 23, 1955   Medicare Observation Status Notification Given:  Yes    Shade Flood, LCSW 01/17/2019, 3:22 PM

## 2019-01-17 NOTE — Plan of Care (Signed)
  Problem: Education: Goal: Knowledge of General Education information will improve Description: Including pain rating scale, medication(s)/side effects and non-pharmacologic comfort measures Outcome: Progressing   Problem: Health Behavior/Discharge Planning: Goal: Ability to manage health-related needs will improve Outcome: Progressing   Problem: Clinical Measurements: Goal: Ability to maintain clinical measurements within normal limits will improve Outcome: Progressing Goal: Diagnostic test results will improve Outcome: Progressing   Problem: Clinical Measurements: Goal: Will remain free from infection Outcome: Not Progressing Note: Remains on IV antibiotics afebrile COVID pending Goal: Respiratory complications will improve Outcome: Not Progressing Note: Continues to have a cough

## 2019-01-18 DIAGNOSIS — R079 Chest pain, unspecified: Secondary | ICD-10-CM | POA: Diagnosis not present

## 2019-01-18 LAB — GLUCOSE, CAPILLARY
Glucose-Capillary: 148 mg/dL — ABNORMAL HIGH (ref 70–99)
Glucose-Capillary: 109 mg/dL — ABNORMAL HIGH (ref 70–99)

## 2019-01-18 MED ORDER — CLINDAMYCIN HCL 300 MG PO CAPS: 300.0000 mg | ORAL_CAPSULE | Freq: Three times a day (TID) | ORAL | 0 refills | Status: AC

## 2019-01-18 MED ORDER — METOPROLOL TARTRATE 25 MG PO TABS: 25.0000 mg | ORAL_TABLET | Freq: Two times a day (BID) | ORAL | 0 refills | Status: DC

## 2019-01-18 MED ORDER — PROBIOTIC 250 MG PO CAPS: 1.0000 | ORAL_CAPSULE | Freq: Three times a day (TID) | ORAL | 0 refills | Status: DC

## 2019-01-18 MED ORDER — HYDROCODONE-ACETAMINOPHEN 5-325 MG PO TABS: 1.0000 | ORAL_TABLET | Freq: Four times a day (QID) | ORAL | 0 refills | Status: DC | PRN

## 2019-01-18 NOTE — Discharge Summary (Signed)
[facility] [place] NAME: Timothy Galvan    MR#:  [id number]  DATE OF BIRTH:  May 11, 1956  DATE OF ADMISSION:  01/16/2019 ADMITTING PHYSICIAN: Epifanio Lesches, MD  DATE OF DISCHARGE: 01/18/2019   PRIMARY CARE PHYSICIAN: Petra Kuba, MD    ADMISSION DIAGNOSIS:  chest and back pain  DISCHARGE DIAGNOSIS:  Active Problems:   Chest pain   SECONDARY DIAGNOSIS:   Past Medical History:  Diagnosis Date  . Asthma   . Blood clot in vein   . Cancer ([place])   . Coronary artery disease   . GERD (gastroesophageal reflux disease)   . Heart disease   . Hypertension   . Pneumonia     HOSPITAL COURSE:   63 year old male with history of CAD, balloon angioplasty in 2017 and follows up with [facility] cardiology, history of GERD, substance abuse disorder, depression, essential hypertension, chronic pain syndrome, GERD comes in because of upper lip swelling, chest pain. #1,atypical chest pain with normal range high-sensitivity troponins-checked 3 times,  continue aspirin, nitroglycerin,, small dose beta-blockers, because of history of CAD, can be chronic angina   continue statins, nitrates, beta-blockers,as troponins are negative patient needs outpatient follow-up with cardiologist at [facility]. 2. Periapical abscess of left lateral maxillary incisor, patient had a CT maxillofacial area with contrast in the emergency room, continue IV clindamycin along with Decadron, ENT is consulted. Appreciate help and advised to continue IV antibiotic here and changed to oral clindamycin and follow-up with dentist for tooth extraction after discharge. 3. GERD: Continue PPIs 4. History of PAD with stable claudication, history of avascular necrosis of femur, follows with PCP. #5/acute kidney injury secondary to dehydration and not eating, continue gentle hydration. Renal function is better.  DISCHARGE CONDITIONS:   Stable.  CONSULTS OBTAINED:  Treatment  Team:  Clyde Canterbury, MD  DRUG ALLERGIES:   Allergies  Allergen Reactions  . Penicillins     Has patient had a PCN reaction causing immediate rash, facial/tongue/throat swelling, SOB or lightheadedness with hypotension: Unknown Has patient had a PCN reaction causing severe rash involving mucus membranes or skin necrosis: Unknown Has patient had a PCN reaction that required hospitalization: Unknown Has patient had a PCN reaction occurring within the last 10 years: Unknown If all of the above answers are "NO", then may proceed with Cephalosporin use.     DISCHARGE MEDICATIONS:   Allergies as of 01/18/2019      Reactions   Penicillins    Has patient had a PCN reaction causing immediate rash, facial/tongue/throat swelling, SOB or lightheadedness with hypotension: Unknown Has patient had a PCN reaction causing severe rash involving mucus membranes or skin necrosis: Unknown Has patient had a PCN reaction that required hospitalization: Unknown Has patient had a PCN reaction occurring within the last 10 years: Unknown If all of the above answers are "NO", then may proceed with Cephalosporin use.      Medication List    TAKE these medications   acetaminophen 325 MG tablet Commonly known as: TYLENOL Take 2 tablets (650 mg total) by mouth every 6 (six) hours as needed for mild pain (or Fever >/= 101).   alendronate 70 MG tablet Commonly known as: Fosamax Take 1 tablet (70 mg total) by mouth once a week. Take with a full glass of water on an empty stomach.   aspirin EC 81 MG tablet Take 81 mg by mouth at bedtime.   atorvastatin 40 MG tablet Commonly known as: LIPITOR Take  40 mg by mouth at bedtime.   clindamycin 300 MG capsule Commonly known as: CLEOCIN Take 1 capsule (300 mg total) by mouth 3 (three) times daily for 10 days.   gabapentin 300 MG capsule Commonly known as: NEURONTIN Take 600 mg by mouth 3 (three) times daily.   HYDROcodone-acetaminophen 5-325 MG  tablet Commonly known as: NORCO/VICODIN Take 1 tablet by mouth every 6 (six) hours as needed for moderate pain.   Ipratropium-Albuterol 20-100 MCG/ACT Aers respimat Commonly known as: COMBIVENT Inhale 1 puff into the lungs every 6 (six) hours as needed for wheezing.   isosorbide mononitrate 60 MG 24 hr tablet Commonly known as: IMDUR Take 60 mg by mouth at bedtime.   latanoprost 0.005 % ophthalmic solution Commonly known as: XALATAN Place 1 drop into the right eye at bedtime.   Melatonin 5 MG Tabs Take 5 mg by mouth at bedtime.   meloxicam 15 MG tablet Commonly known as: MOBIC Take 1 tablet (15 mg total) by mouth daily.   metoprolol tartrate 25 MG tablet Commonly known as: LOPRESSOR Take 1 tablet (25 mg total) by mouth 2 (two) times daily.   nitroGLYCERIN 0.4 MG SL tablet Commonly known as: NITROSTAT Place 0.4 mg under the tongue every 5 (five) minutes as needed for chest pain.   omeprazole 20 MG capsule Commonly known as: PRILOSEC Take 20 mg by mouth at bedtime.   Probiotic 250 MG Caps Take 1 capsule by mouth 3 (three) times daily.        DISCHARGE INSTRUCTIONS:    Follow with PMD in 1- 2 weeks. Follow with dentist in 1-2 weeks.  If you experience worsening of your admission symptoms, develop shortness of breath, life threatening emergency, suicidal or homicidal thoughts you must seek medical attention immediately by calling 911 or calling your MD immediately  if symptoms less severe.  You Must read complete instructions/literature along with all the possible adverse reactions/side effects for all the Medicines you take and that have been prescribed to you. Take any new Medicines after you have completely understood and accept all the possible adverse reactions/side effects.   Please note  You were cared for by a hospitalist during your hospital stay. If you have any questions about your discharge medications or the care you received while you were in the  hospital after you are discharged, you can call the unit and asked to speak with the hospitalist on call if the hospitalist that took care of you is not available. Once you are discharged, your primary care physician will handle any further medical issues. Please note that NO REFILLS for any discharge medications will be authorized once you are discharged, as it is imperative that you return to your primary care physician (or establish a relationship with a primary care physician if you do not have one) for your aftercare needs so that they can reassess your need for medications and monitor your lab values.    Today   CHIEF COMPLAINT:   Chief Complaint  Patient presents with  . Chest Pain  . Facial Swelling    HISTORY OF PRESENT ILLNESS:  Tarius Stangelo  is a 63 y.o. male with a known history of essential hypertension, GERD, CAD, sent because of upper lip swelling started for last 2 to 3 days, patient started on clindamycin as an outpatient but patient has significant swelling, pain of whole upper lip and pain radiating all the way to left side.  Patient also complained of chest pain he says chest  pain mainly on the right side and also says that his back also is hurting.  Unable to describe if he had any shortness of breath.  Says he has some cough but no fever.  No exertional dyspnea.  No aggravating or relieving factors for chest pain.  No nausea or vomiting or sweating associated with chest pain.  Patient appears comfortable and his main complaint today is upper sleep swelling.   VITAL SIGNS:  Blood pressure (!) 124/57, pulse 63, temperature 97.6 F (36.4 C), temperature source Oral, resp. rate 19, height 5\' 3"  (1.6 m), weight 59 kg, SpO2 100 %.  I/O:    Intake/Output Summary (Last 24 hours) at 01/18/2019 1114 Last data filed at 01/18/2019 1023 Gross per 24 hour  Intake 290 ml  Output 1975 ml  Net -1685 ml    PHYSICAL EXAMINATION:   GENERAL:  63 y.o.-year-old patient lying in the bed  with no acute distress.  EYES: Pupils equal, round, reactive to light and accommodation. No scleral icterus. Extraocular muscles intact.  HEENT: Head atraumatic, normocephalic.  Broken left upper incisor with some infection on the gingiva near the tooth.  NECK:  Supple, no jugular venous distention. No thyroid enlargement, no tenderness.  LUNGS: Normal breath sounds bilaterally, no wheezing, rales,rhonchi or crepitation. No use of accessory muscles of respiration.  CARDIOVASCULAR: S1, S2 normal. No murmurs, rubs, or gallops.  ABDOMEN: Soft, nontender, nondistended. Bowel sounds present. No organomegaly or mass.  EXTREMITIES: No pedal edema, cyanosis, or clubbing.  NEUROLOGIC: Cranial nerves II through XII are intact. Muscle strength 5/5 in all extremities. Sensation intact. Gait not checked.  PSYCHIATRIC: The patient is alert and oriented x 3.  SKIN: No obvious rash, lesion, or ulcer.   DATA REVIEW:   CBC Recent Labs  Lab 01/17/19 0543  WBC 5.7  HGB 12.4*  HCT 38.1*  PLT 214    Chemistries  Recent Labs  Lab 01/17/19 0543  NA 135  K 4.2  CL 108  CO2 21*  GLUCOSE 249*  BUN 19  CREATININE 1.11  CALCIUM 8.6*    Cardiac Enzymes No results for input(s): TROPONINI in the last 168 hours.  Microbiology Results  Results for orders placed or performed during the hospital encounter of 01/16/19  Novel Coronavirus,NAA,(SEND-OUT TO REF LAB - TAT 24-48 hrs); Hosp Order     Status: None   Collection Time: 01/16/19  2:06 PM   Specimen: Nasopharyngeal Swab; Respiratory  Result Value Ref Range Status   SARS-CoV-2, NAA NOT DETECTED NOT DETECTED Final    Comment: (NOTE) This test was developed and its performance characteristics determined by [employer]. This test has not been FDA cleared or approved. This test has been authorized by FDA under an Emergency Use Authorization (EUA). This test is only authorized for the duration of time the declaration that circumstances  exist justifying the authorization of the emergency use of in vitro diagnostic tests for detection of SARS-CoV-2 virus and/or diagnosis of COVID-19 infection under section 564(b)(1) of the Act, 21 U.S.C. 161WRU-0(A)(5), unless the authorization is terminated or revoked sooner. When diagnostic testing is negative, the possibility of a false negative result should be considered in the context of a patient's recent exposures and the presence of clinical signs and symptoms consistent with COVID-19. An individual without symptoms of COVID-19 and who is not shedding SARS-CoV-2 virus would expect to have a negative (not detected) result in this assay. Performed  At: [facility] [address] [place], [place] [id number] Rush Farmer  MD FF:[id number]    Coronavirus Source NASOPHARYNGEAL  Final    Comment: Performed at [facility], [address] [place], [place] [address]    RADIOLOGY:  Dg Chest 2 View  Result Date: 01/16/2019 CLINICAL DATA:  Central chest pain over the last 2 days. Facial swelling. EXAM: CHEST - 2 VIEW COMPARISON:  04/01/2017 FINDINGS: Heart size is normal. Aortic atherosclerosis. Upper lobe emphysema, particularly on the right. Crowding of the markings below that. Question mild interstitial edema. No infiltrate, collapse or effusion. IMPRESSION: Emphysema. Aortic atherosclerosis. Question early interstitial edema. Electronically Signed   By: Nelson Chimes M.D.   On: 01/16/2019 14:55   Ct Maxillofacial W Contrast  Result Date: 01/16/2019 CLINICAL DATA:  Upper lip and left cheek swelling for 2 days. Evaluation for abscess. EXAM: CT MAXILLOFACIAL WITH CONTRAST TECHNIQUE: Multidetector CT imaging of the maxillofacial structures was performed with intravenous contrast. Multiplanar CT image reconstructions were also generated. CONTRAST:  52mL OMNIPAQUE IOHEXOL 300 MG/ML  SOLN COMPARISON:  None. FINDINGS: Osseous: No acute fracture. Left maxillary sinus deformity  suggesting remote trauma. Multiple dental caries. Prominent periapical lucency associated with the left lateral maxillary incisor with a 6 mm component extending anterior to the bone and with associated overlying soft tissue swelling. Orbits: Unremarkable. Sinuses: Paranasal sinuses and mastoid air cells are clear. Soft tissues: Soft tissue swelling anterior to the maxilla, left greater than right. 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 Limited intracranial: Carotid siphon atherosclerosis. Reconstitution of the right supraclinoid ICA. IMPRESSION: 1. Periapical abscess involving the left lateral maxillary incisor with overlying soft tissue swelling/cellulitis. 2. 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 Electronically Signed   By: Logan Bores M.D.   On: 01/16/2019 14:41    EKG:   Orders placed or performed during the hospital encounter of 01/16/19  . EKG 12-Lead  . EKG 12-Lead  . ED EKG  . ED EKG      Management plans discussed with the patient, family and they are in agreement.  CODE STATUS:     Code Status Orders  (From admission, onward)         Start     Ordered   01/16/19 1448  Full code  Continuous     01/16/19 1454        Code Status History    Date Active Date Inactive Code Status Order ID Comments User Context   01/10/2018 2202 01/13/2018 1301 Full Code [id number]  Lance Coon, MD Inpatient   Advance Care Planning Activity      TOTAL TIME TAKING CARE OF THIS PATIENT: 35 minutes.    Vaughan Basta M.D on 01/18/2019 at 11:14 AM  Between 7am to 6pm - Pager - [phone]  After 6pm go to [email or web address] - password [email or web address] [facility]  Office  [phone]  CC: Primary care physician; Petra Kuba, MD   Note: This dictation was prepared with Dragon dictation along with smaller phrase technology. Any transcriptional errors that result from this process are unintentional.

## 2019-01-18 NOTE — Discharge Instructions (Signed)
Follow with dentist ASAP.

## 2019-01-18 NOTE — Progress Notes (Signed)
Pt to be discharged today. Iv and tele removed. disch instructions and prescrips given. Explained to pt's family to call for dental appt at [facility]. Phone # provided.Marland Kitchen disch via w.c.

## 2019-02-04 ENCOUNTER — Encounter: Payer: Self-pay | Admitting: Emergency Medicine

## 2019-02-04 ENCOUNTER — Emergency Department: Payer: Medicare Other

## 2019-02-04 ENCOUNTER — Emergency Department
Admission: EM | Admit: 2019-02-04 | Discharge: 2019-02-04 | Disposition: A | Discharge status: Home / Self Care | Payer: Medicare Other | Attending: Emergency Medicine | Admitting: Emergency Medicine

## 2019-02-04 ENCOUNTER — Other Ambulatory Visit: Payer: Self-pay

## 2019-02-04 DIAGNOSIS — I10 Essential (primary) hypertension: Secondary | ICD-10-CM | POA: Insufficient documentation

## 2019-02-04 DIAGNOSIS — F17210 Nicotine dependence, cigarettes, uncomplicated: Secondary | ICD-10-CM | POA: Diagnosis not present

## 2019-02-04 DIAGNOSIS — Z79899 Other long term (current) drug therapy: Secondary | ICD-10-CM | POA: Diagnosis not present

## 2019-02-04 DIAGNOSIS — Z20828 Contact with and (suspected) exposure to other viral communicable diseases: Secondary | ICD-10-CM | POA: Diagnosis not present

## 2019-02-04 DIAGNOSIS — I2510 Atherosclerotic heart disease of native coronary artery without angina pectoris: Secondary | ICD-10-CM | POA: Insufficient documentation

## 2019-02-04 DIAGNOSIS — R079 Chest pain, unspecified: Secondary | ICD-10-CM | POA: Insufficient documentation

## 2019-02-04 DIAGNOSIS — F331 Major depressive disorder, recurrent, moderate: Secondary | ICD-10-CM | POA: Insufficient documentation

## 2019-02-04 DIAGNOSIS — Z7982 Long term (current) use of aspirin: Secondary | ICD-10-CM | POA: Diagnosis not present

## 2019-02-04 LAB — CBC WITH DIFFERENTIAL/PLATELET
Abs Immature Granulocytes: 0.03 10*3/uL (ref 0.00–0.07)
Basophils Absolute: 0 10*3/uL (ref 0.0–0.1)
Basophils Relative: 0 %
Eosinophils Absolute: 0 10*3/uL (ref 0.0–0.5)
Eosinophils Relative: 1 %
HCT: 44.3 % (ref 39.0–52.0)
Hemoglobin: 14.4 g/dL (ref 13.0–17.0)
Immature Granulocytes: 0 %
Lymphocytes Relative: 50 %
Lymphs Abs: 3.9 10*3/uL (ref 0.7–4.0)
MCH: 30.1 pg (ref 26.0–34.0)
MCHC: 32.5 g/dL (ref 30.0–36.0)
MCV: 92.7 fL (ref 80.0–100.0)
Monocytes Absolute: 0.7 10*3/uL (ref 0.1–1.0)
Monocytes Relative: 9 %
Neutro Abs: 3.1 10*3/uL (ref 1.7–7.7)
Neutrophils Relative %: 40 %
Platelets: 232 10*3/uL (ref 150–400)
RBC: 4.78 MIL/uL (ref 4.22–5.81)
RDW: 14.8 % (ref 11.5–15.5)
WBC: 7.8 10*3/uL (ref 4.0–10.5)
nRBC: 0 % (ref 0.0–0.2)

## 2019-02-04 LAB — BASIC METABOLIC PANEL
Anion gap: 6 (ref 5–15)
BUN: 20 mg/dL (ref 8–23)
CO2: 28 mmol/L (ref 22–32)
Calcium: 9.5 mg/dL (ref 8.9–10.3)
Chloride: 110 mmol/L (ref 98–111)
Creatinine, Ser: 0.84 mg/dL (ref 0.61–1.24)
GFR calc Af Amer: >60 mL/min (ref >60)
GFR calc non Af Amer: >60 mL/min (ref >60)
Glucose, Bld: 88 mg/dL (ref 70–99)
Potassium: 4.8 mmol/L (ref 3.5–5.1)
Sodium: 144 mmol/L (ref 135–145)

## 2019-02-04 LAB — TROPONIN I (HIGH SENSITIVITY)
Troponin I (High Sensitivity): 14 ng/L (ref <18)
Troponin I (High Sensitivity): 15 ng/L (ref <18)

## 2019-02-04 LAB — ETHANOL: Alcohol, Ethyl (B): <10 mg/dL (ref <10)

## 2019-02-04 LAB — PROTIME-INR
INR: 0.9 (ref 0.8–1.2)
Prothrombin Time: 11.9 seconds (ref 11.4–15.2)

## 2019-02-04 LAB — SARS CORONAVIRUS 2 BY RT PCR (HOSPITAL ORDER, PERFORMED IN ~~LOC~~ HOSPITAL LAB): SARS Coronavirus 2: NEGATIVE

## 2019-02-04 LAB — LIPASE, BLOOD: Lipase: 21 U/L (ref 11–51)

## 2019-02-04 LAB — APTT: aPTT: 28 seconds (ref 24–36)

## 2019-02-04 MED ORDER — OXYCODONE-ACETAMINOPHEN 5-325 MG PO TABS
1.0000 | ORAL_TABLET | Freq: Once | ORAL | Status: AC
  Administered 2019-02-04: 12:00:00 1 via ORAL
  Filled 2019-02-04: qty 1

## 2019-02-04 MED ORDER — ISOSORBIDE MONONITRATE ER 60 MG PO TB24: 60.0000 mg | ORAL_TABLET | Freq: Every day | ORAL | Status: DC

## 2019-02-04 MED ORDER — ISOSORBIDE MONONITRATE ER 60 MG PO TB24: 60.0000 mg | ORAL_TABLET | Freq: Every day | ORAL | 0 refills | Status: DC

## 2019-02-04 MED ORDER — ASPIRIN 81 MG PO CHEW
324.0000 mg | CHEWABLE_TABLET | Freq: Once | ORAL | Status: AC
  Administered 2019-02-04: 10:00:00 324 mg via ORAL
  Filled 2019-02-04: qty 4

## 2019-02-04 MED ORDER — NITROGLYCERIN 0.4 MG SL SUBL: 0.4000 mg | SUBLINGUAL_TABLET | SUBLINGUAL | 0 refills | Status: DC | PRN

## 2019-02-04 MED ORDER — FENTANYL CITRATE (PF) 100 MCG/2ML IJ SOLN
50.0000 ug | Freq: Once | INTRAMUSCULAR | Status: AC
  Administered 2019-02-04: 10:00:00 50 ug via INTRAVENOUS
  Filled 2019-02-04: qty 2

## 2019-02-04 MED ORDER — ISOSORBIDE MONONITRATE ER 60 MG PO TB24
60.0000 mg | ORAL_TABLET | Freq: Every day | ORAL | Status: DC
  Administered 2019-02-04: 11:00:00 60 mg via ORAL
  Filled 2019-02-04: qty 1

## 2019-02-04 NOTE — ED Notes (Signed)
Pt requesting food, no food at present until lab work results per EDP.

## 2019-02-04 NOTE — ED Notes (Signed)
First Nurse Note: Pt to ED c/o chest pain. Pt states that he cannot find his medication for chest pain. Pt is in NAD

## 2019-02-04 NOTE — ED Provider Notes (Addendum)
[facility] Emergency Department Provider Note  ____________________________________________   I have reviewed the triage vital signs and the nursing notes. Where available I have reviewed prior notes and, if possible and indicated, outside hospital notes.   Patient seen and evaluated during the coronavirus epidemic during a time with low staffing  Patient seen for the symptoms described in the history of present illness. She was evaluated in the context of the global COVID-19 pandemic, which necessitated consideration that the patient might be at risk for infection with the SARS-CoV-2 virus that causes COVID-19. Institutional protocols and algorithms that pertain to the evaluation of patients at risk for COVID-19 are in a state of rapid change based on information released by regulatory bodies including the CDC and federal and state organizations. These policies and algorithms were followed during the patient's care in the ED.    HISTORY  Chief Complaint Chest Pain    HPI Timothy Galvan is a 63 y.o. male  with history of CAD, balloon angioplasty in 2017 and follows up with [facility] cardiology, history of GERD, substance abuse disorder, depression, essential hypertension, chronic pain syndrome, GERD ongoing tobacco abuse, who had a recent outpatient echo on the 16th of this month which showed abnormal myocardial perfusion with minimally reversible defect in the apical apical inferior apical anterior apical septal and mid anterior basal anterior segments.  EF of 44%.  Coronary calcifications on the attenuated CT was getting noted.  And emphysematous changes of the lung.  Patient appears to be scheduled for an outpatient cardiac catheterization on the 29th of this month.  He states that he has had chest pain every minute of the day for the last several weeks.  He has chronic chest pain.  He is supposed to be taking "a lot of medications" including isosorbide, however he lost them  all.  He states that his pain is worse in his chest.  He describes a atypical chest pain in terms of cardiac symptoms, it is a constant pain which is worse with cough or changing position or when he touches his chest.  It is like he hurt his ribs coughing he states.  Is on the left side there is no radiation.  Is tender to touch. He denies any increase shortness of breath over normal he continues to smoke and drink and actually is holding his lighter in his hand as he talks to me.  He denies any fever or chills or significant cough over his baseline "smoker's cough".   Past Medical History:  Diagnosis Date  . Asthma   . Blood clot in vein   . Cancer ([place])   . Coronary artery disease   . GERD (gastroesophageal reflux disease)   . Heart disease   . Hypertension   . Pneumonia     Patient Active Problem List   Diagnosis Date Noted  . Chest pain 01/16/2019  . GERD (gastroesophageal reflux disease) 01/10/2018  . CAD (coronary artery disease) 01/10/2018  . Closed compression fracture of body of L1 vertebra ([place]) 01/10/2018  . HLD (hyperlipidemia) 01/10/2018    Past Surgical History:  Procedure Laterality Date  . MANDIBLE SURGERY    . WRIST SURGERY      Prior to Admission medications   Medication Sig Start Date End Date Taking? Authorizing Provider  acetaminophen (TYLENOL) 325 MG tablet Take 2 tablets (650 mg total) by mouth every 6 (six) hours as needed for mild pain (or Fever >/= 101). 01/13/18   Loletha Grayer, MD  alendronate (FOSAMAX) 70 MG tablet Take 1 tablet (70 mg total) by mouth once a week. Take with a full glass of water on an empty stomach. Patient not taking: Reported on 01/16/2019 01/13/18   Loletha Grayer, MD  aspirin EC 81 MG tablet Take 81 mg by mouth at bedtime.    [provider]  atorvastatin (LIPITOR) 40 MG tablet Take 40 mg by mouth at bedtime.     [provider]  gabapentin (NEURONTIN) 300 MG capsule Take 600 mg by mouth 3 (three) times daily.  11/21/18   [provider]  HYDROcodone-acetaminophen (NORCO/VICODIN) 5-325 MG tablet Take 1 tablet by mouth every 6 (six) hours as needed for moderate pain. 01/18/19   Vaughan Basta, MD  Ipratropium-Albuterol (COMBIVENT) 20-100 MCG/ACT AERS respimat Inhale 1 puff into the lungs every 6 (six) hours as needed for wheezing.    [provider]  isosorbide mononitrate (IMDUR) 60 MG 24 hr tablet Take 60 mg by mouth at bedtime.     [provider]  latanoprost (XALATAN) 0.005 % ophthalmic solution Place 1 drop into the right eye at bedtime. 01/09/19   [provider]  Melatonin 5 MG TABS Take 5 mg by mouth at bedtime.    [provider]  meloxicam (MOBIC) 15 MG tablet Take 1 tablet (15 mg total) by mouth daily. Patient not taking: Reported on 01/16/2019 01/13/18   Loletha Grayer, MD  metoprolol tartrate (LOPRESSOR) 25 MG tablet Take 1 tablet (25 mg total) by mouth 2 (two) times daily. 01/18/19   Vaughan Basta, MD  nitroGLYCERIN (NITROSTAT) 0.4 MG SL tablet Place 0.4 mg under the tongue every 5 (five) minutes as needed for chest pain.    [provider]  omeprazole (PRILOSEC) 20 MG capsule Take 20 mg by mouth at bedtime.     [provider]  Saccharomyces boulardii (PROBIOTIC) 250 MG CAPS Take 1 capsule by mouth 3 (three) times daily. 01/18/19   Vaughan Basta, MD    Allergies Penicillins  History reviewed. No pertinent family history.  Social History Social History   Tobacco Use  . Smoking status: Current Every Day Smoker    Packs/day: 1.00  . Smokeless tobacco: Never Used  Substance Use Topics  . Alcohol use: Yes    Alcohol/week: 12.0 standard drinks    Types: 12 Cans of beer per week  . Drug use: Yes    Types: Cocaine    Review of Systems Constitutional: No fever/chills Eyes: No visual changes. ENT: No sore throat. No stiff neck no neck pain Cardiovascular: + chest pain. Respiratory: Denies shortness of  breath. Gastrointestinal:   no vomiting.  No diarrhea.  No constipation. Genitourinary: Negative for dysuria. Musculoskeletal: Negative lower extremity swelling Skin: Negative for rash. Neurological: Negative for severe headaches, focal weakness or numbness.   ____________________________________________   PHYSICAL EXAM:  VITAL SIGNS: ED Triage Vitals  Enc Vitals Group     BP 02/04/19 0923 (!) 155/95     Pulse Rate 02/04/19 0923 60     Resp 02/04/19 0923 20     Temp 02/04/19 0923 97.9 F (36.6 C)     Temp Source 02/04/19 0923 Oral     SpO2 02/04/19 0923 96 %     Weight 02/04/19 0921 130 lb 1.6 oz (59 kg)     Height 02/04/19 0921 5\' 3"  (1.6 m)     Head Circumference --      Peak Flow --      Pain Score 02/04/19 0921  8     Pain Loc --      Pain Edu? --      Excl. in [place]? --     Constitutional: Alert and oriented. Well appearing and in no acute distress. Eyes: Conjunctivae are normal Head: Atraumatic HEENT: No congestion/rhinnorhea. Mucous membranes are moist.  Oropharynx non-erythematous Neck:   Nontender with no meningismus, no masses, no stridor Cardiovascular: Normal rate, regular rhythm. Grossly normal heart sounds.  Good peripheral circulation. Chest: Tender with palpation to the chest wall in the costochondral margin on the left side when I touch this area patient states "ouch that the pain right there" and pulls back.  No crepitus no flail chest Respiratory: Normal respiratory effort.  No retractions. Lungs CTAB. Abdominal: Soft and nontender. No distention. No guarding no rebound Back:  There is no focal tenderness or step off.  there is no midline tenderness there are no lesions noted. there is no CVA tenderness Musculoskeletal: No lower extremity tenderness, no upper extremity tenderness. No joint effusions, no DVT signs strong distal pulses no edema Neurologic:  Normal speech and language. No gross focal neurologic deficits are appreciated.  Skin:  Skin is warm,  dry and intact. No rash noted. Psychiatric: Mood and affect are normal. Speech and behavior are normal.  ____________________________________________   LABS (all labs ordered are listed, but only abnormal results are displayed)  Labs Reviewed  SARS CORONAVIRUS 2 (HOSPITAL ORDER, [facility])  CBC WITH DIFFERENTIAL/PLATELET  PROTIME-INR  APTT  ETHANOL  BASIC METABOLIC PANEL  LIPASE, BLOOD  TROPONIN I (HIGH SENSITIVITY)    Pertinent labs  results that were available during my care of the patient were reviewed by me and considered in my medical decision making (see chart for details). ____________________________________________  EKG  I personally interpreted any EKGs ordered by me or triage EKG shows sinus bradycardia rate 58 no acute ST elevation or depression nonspecific ST changes noted anteriorly. ____________________________________________  RADIOLOGY  Pertinent labs & imaging results that were available during my care of the patient were reviewed by me and considered in my medical decision making (see chart for details). If possible, patient and/or family made aware of any abnormal findings.  No results found. ____________________________________________    PROCEDURES  Procedure(s) performed: None  Procedures  Critical Care performed: None  ____________________________________________   INITIAL IMPRESSION / ASSESSMENT AND PLAN / ED COURSE  Pertinent labs & imaging results that were available during my care of the patient were reviewed by me and considered in my medical decision making (see chart for details).   Patient here with several issues.  The first is that he lost all of his medications.  We will give him pain medication aspirin and reassess the need for further intervention.  He did take his medications it sounds like yesterday.  Patient however also has a history of ongoing tobacco abuse and a recent concerning myocardial  stress test which has led cardiology at [facility] to think he needs a heart cath apparently in a couple days.  His chest pain is quite reproducible, there is no evidence of pneumothorax or pneumonia on exam but he does have a very atypical pain for cardiac disease nonetheless there is no doubt that there is cardiac disease present.  Certainly made worse by the fact that he is losing his medications.  We will check cardiac enzymes, we will give him aspirin we will get chest x-ray, low suspicion for PE fortunately.  This is reproducible chest  wall pain and also some degree of possibly cardiogenic discomfort.  I will discuss with cardiology after we have some results and we will see what the next steps are for this patient.  ----------------------------------------- 11:41 AM on 02/04/2019 -----------------------------------------  Patient in no acute distress, high sensitive troponin is negative despite weeks of pain, patient with no evidence of acute infection symptomatically, chest x-ray is concerning for significant emphysema and questionable increased opacities but he is not coughing.  Coronavirus is negative.  Afebrile white count normal etc.  Patient with no acute distress resting comfortably.  This is a very atypical story given her ACS I certainly not think he has a PE or dissection at this time given several weeks of constant pain, he is mostly here because he lost his medications.  However, he is scheduled for an outpatient cardiac catheterization in a few days at [facility].  And I have therefore patient is [facility] cardiologist so over an hour ago and we are waiting to see if we can get some input from the what they like [place] to do for him.  In the meantime we will continue to cycle his troponins.  ----------------------------------------- 1:06 PM on 02/04/2019 -----------------------------------------  Too high sensitive troponins are reassuring.  I did talk to his cardiology group, Dr. Denman George at [facility].  Very much  appreciate consult.  Given chronic nature of the pain negative troponin negative work-up here, they did not want him admitted they did not want him transported, they feel that patient should be able to follow closely with primary cardiologist on Monday and follow-up is planned for his outpatient catheterization.  Again very atypical pain for ACS very reproducible discomfort, patient in no acute distress here.  No evidence of acute coronary syndrome active today.  Given his cardiology's preference for discharge and are reassuring work-up here we will discharge him.  I will however reorder his home medications.     ____________________________________________   FINAL CLINICAL IMPRESSION(S) / ED DIAGNOSES  Final diagnoses:  Chest pain      This chart was dictated using voice recognition software.  Despite best efforts to proofread,  errors can occur which can change meaning.      [name], MD 02/04/19 1007    [name], MD 02/04/19 1143    [name], MD 02/04/19 [phone]

## 2019-02-04 NOTE — ED Triage Notes (Signed)
Pt arrived via POV with reports of acid reflux and chest pain, pt states he got a prescription refilled yesterday and took a dose yesterday, but states he cannot find the medicine today.  Pt c/o central chest pain. Feels likes a "cracked bone"  Pt is smoker.

## 2019-02-04 NOTE — ED Notes (Signed)
Pt may drink per EDP. Pt given water, tolerating well.

## 2019-02-04 NOTE — Discharge Instructions (Signed)
If you have any change in her chronic chest pain, increased shortness of breath fever cough or other concerns return to the emergency room.  Otherwise please follow closely with your cardiologist, call them on Monday, I would also like to make sure that you follow closely for your outpatient heart procedure in a few days.  We advised that you stop smoking.  Is the best thing you can do for your heart.

## 2019-06-13 ENCOUNTER — Other Ambulatory Visit: Payer: Self-pay | Admitting: *Deleted

## 2019-06-13 DIAGNOSIS — Z20822 Contact with and (suspected) exposure to covid-19: Secondary | ICD-10-CM

## 2019-06-15 LAB — NOVEL CORONAVIRUS, NAA: SARS-CoV-2, NAA: NOT DETECTED

## 2019-06-16 ENCOUNTER — Telehealth: Payer: Self-pay | Admitting: Family Medicine

## 2019-06-16 NOTE — Telephone Encounter (Signed)
Negative COVID results given. Patient results "NOT Detected." Caller expressed understanding. ° °

## 2019-08-18 IMAGING — CT CT MAXILLOFACIAL WITH CONTRAST
3 series · 16 of 47 positions shown, 19 images · IV contrast (omnipaque)
Comparison: None.

CLINICAL DATA: Upper lip and left cheek swelling for 2 days.
Evaluation for abscess.

EXAM:
CT MAXILLOFACIAL WITH CONTRAST
TECHNIQUE: Multidetector CT imaging of the maxillofacial structures was
performed with intravenous contrast. Multiplanar CT image
reconstructions were also generated.
CONTRAST:  75mL OMNIPAQUE IOHEXOL 300 MG/ML  SOLN

[Series 2: max soft · axial · 0.31mm/px · z∈[-210,-64]mm · 10 of 85 slices shown, 13 images]
[im 6/85  brain]
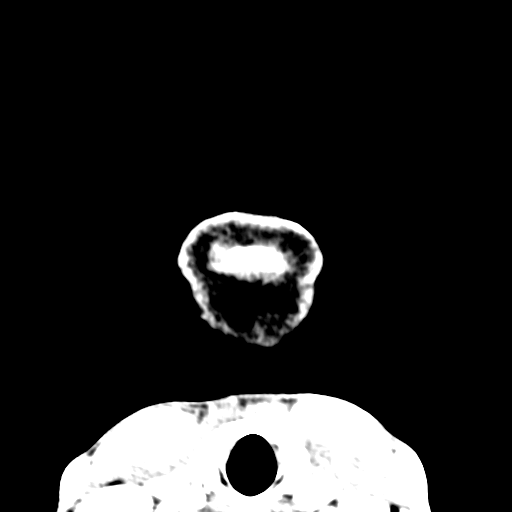
[im 6/85  bone]
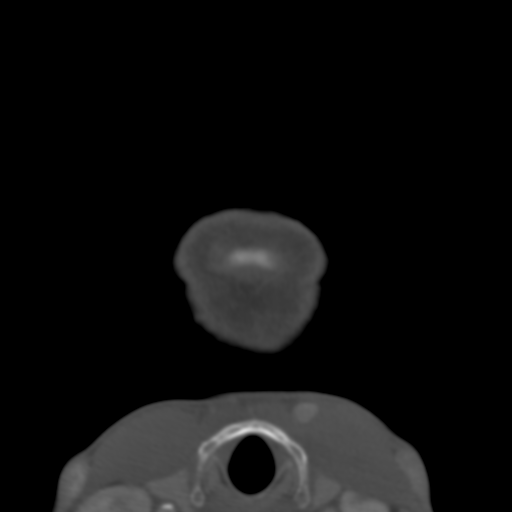
[im 15/85  bone]
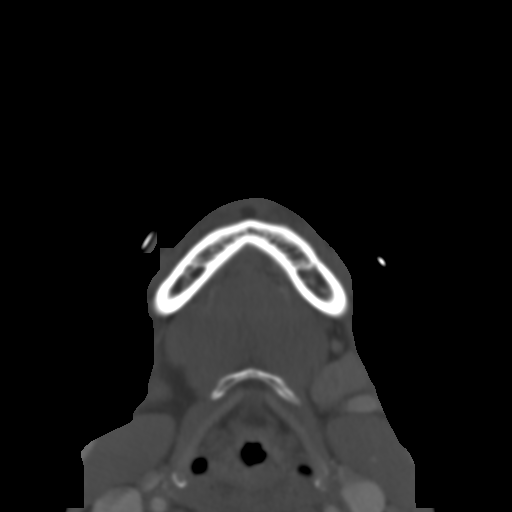
[im 24/85  bone]
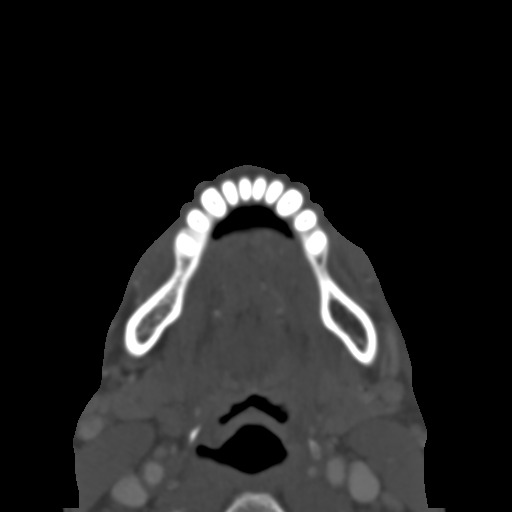
[im 29/85  bone]
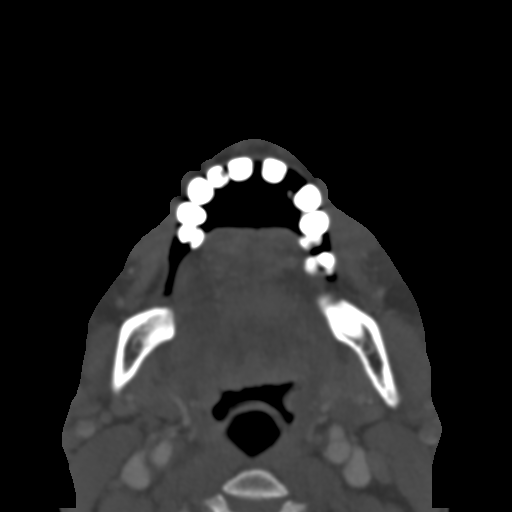
[im 38/85  brain]
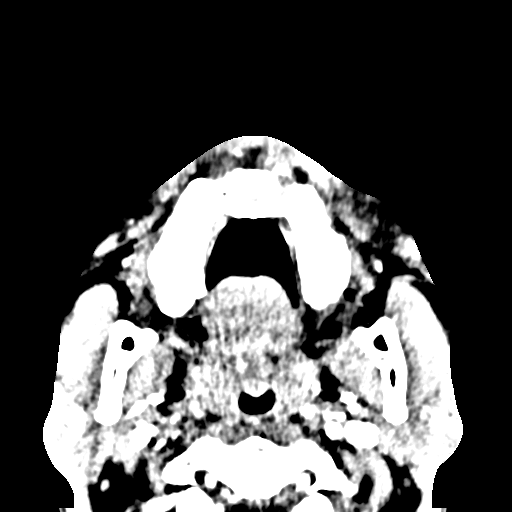
[im 38/85  bone]
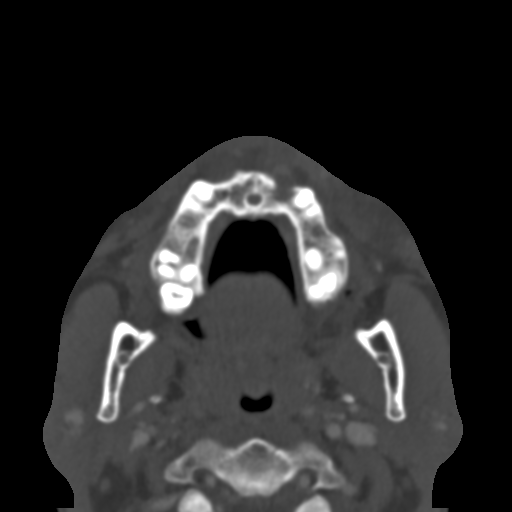
[im 47/85  bone]
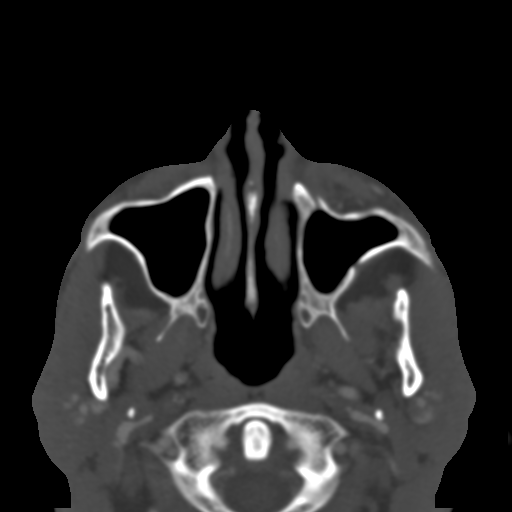
[im 56/85  bone]
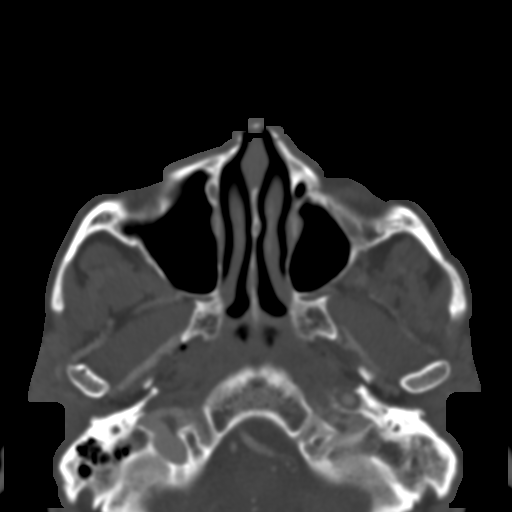
[im 64/85  bone]
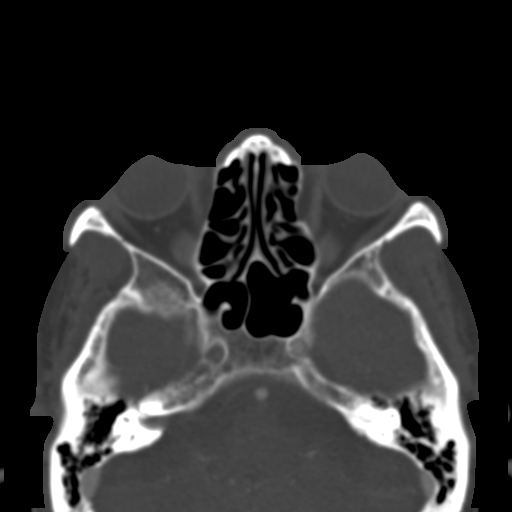
[im 70/85  brain]
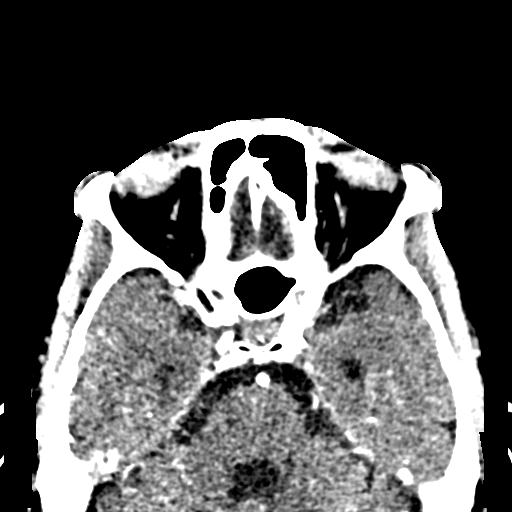
[im 70/85  bone]
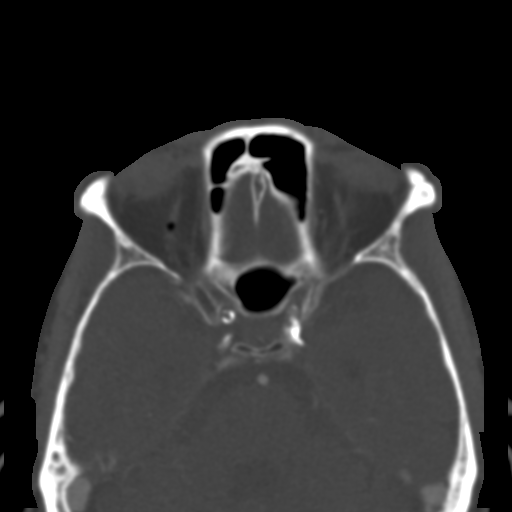
[im 79/85  bone]
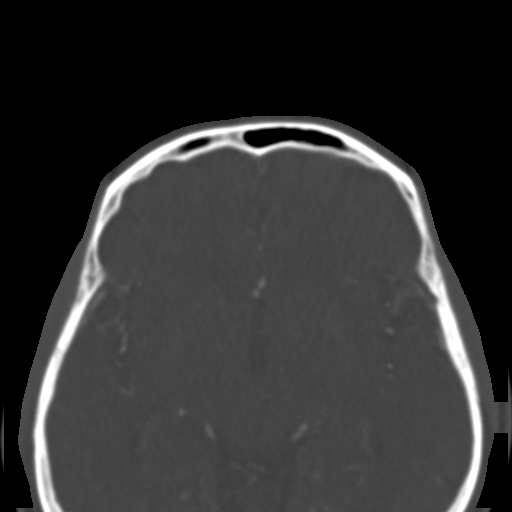

[Series 4: coronal soft · coronal · 0.34mm/px · 3 of 67 slices shown]
[im 23/67  bone]
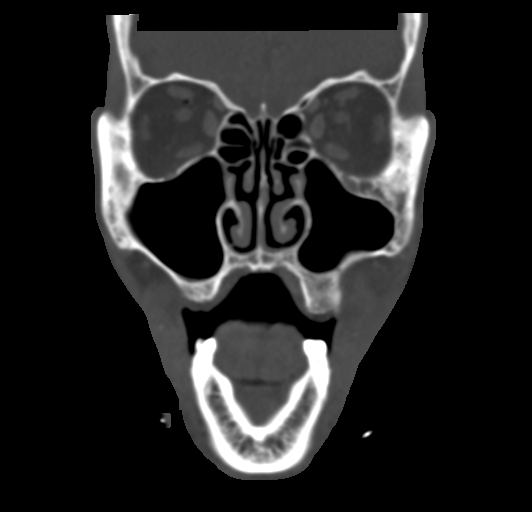
[im 30/67  bone]
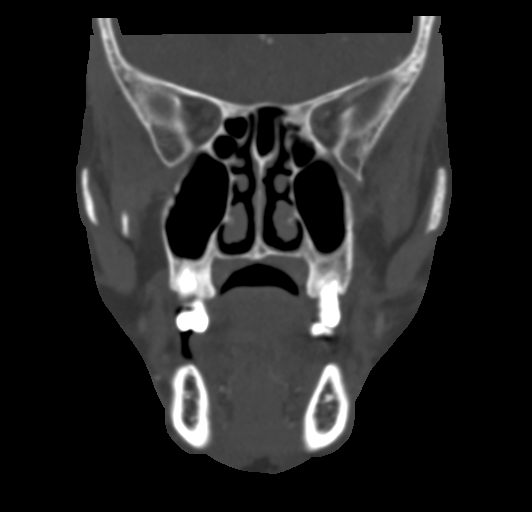
[im 37/67  bone]
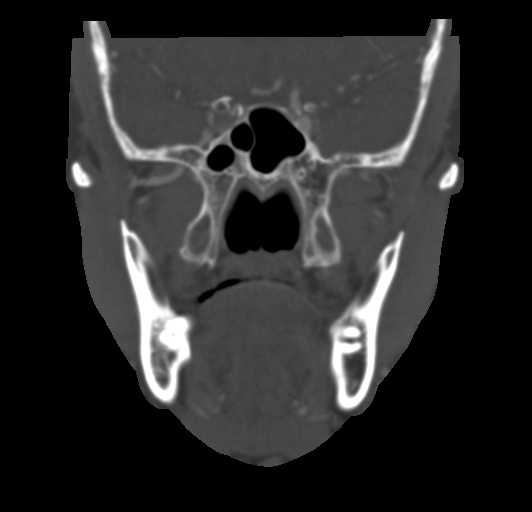

[Series 5: sagittal soft · sagittal · 0.30mm/px · 3 of 82 slices shown]
[im 28/82  bone]
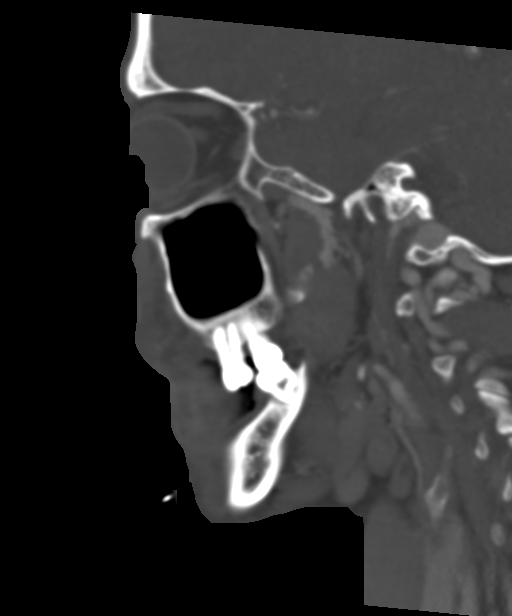
[im 41/82  bone]
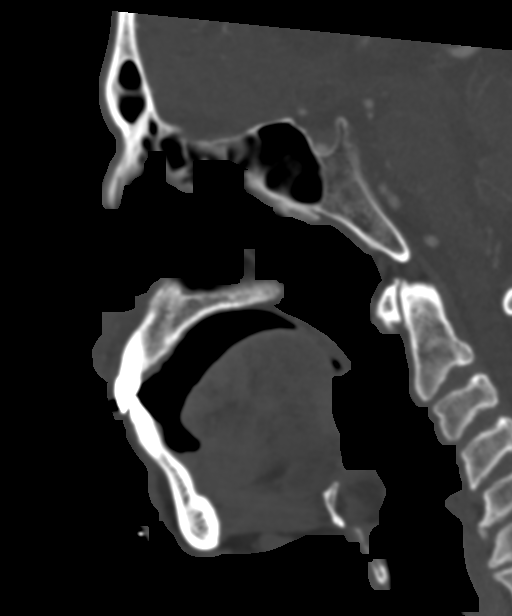
[im 55/82  bone]
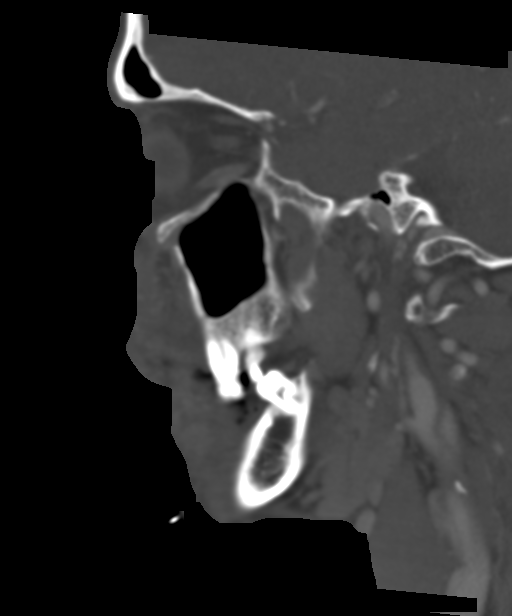

[16 of 47 positions shown; findings below may reference images not displayed]

FINDINGS: Osseous: No acute fracture. Left maxillary sinus deformity
suggesting remote trauma. Multiple dental caries. Prominent
periapical lucency associated with the left lateral maxillary
incisor with a 6 mm component extending anterior to the bone and
with associated overlying soft tissue swelling.

Orbits: Unremarkable.

Sinuses: Paranasal sinuses and mastoid air cells are clear.

Soft tissues: Soft tissue swelling anterior to the maxilla, left
greater than right. Right ICA occlusion at its origin.

Limited intracranial: Carotid siphon atherosclerosis. Reconstitution
of the right supraclinoid ICA.
IMPRESSION: 1. Periapical abscess involving the left lateral maxillary incisor
with overlying soft tissue swelling/cellulitis.
2. Right ICA occlusion at its origin with distal intracranial
reconstitution.

## 2019-09-06 IMAGING — DX PORTABLE CHEST - 1 VIEW
1 series · 1 of 1 positions shown · non-contrast
Comparison: January 16, 2019

CLINICAL DATA: 62-year-old male with a history of chest pain

EXAM:
PORTABLE CHEST 1 VIEW

[chest ap]
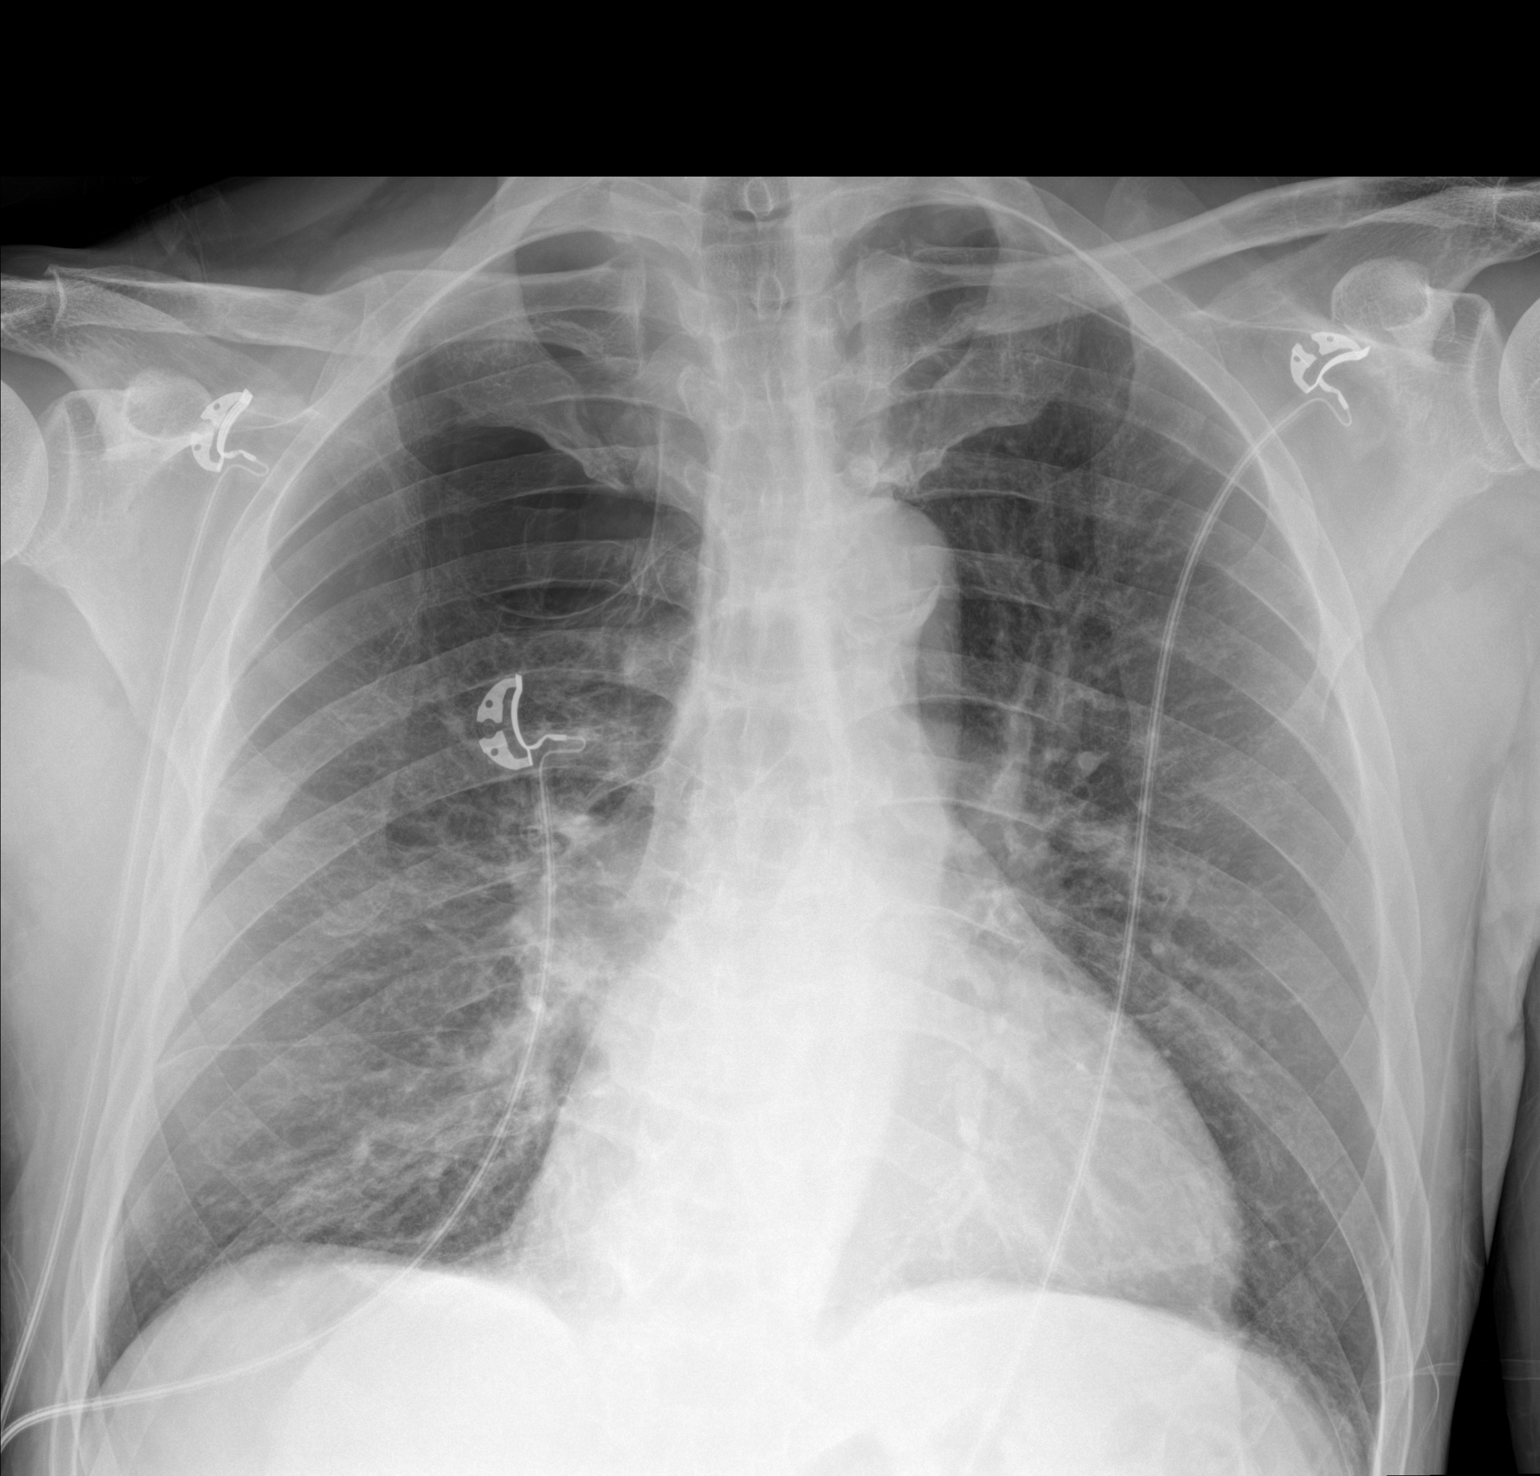

[1 of 1 positions shown; findings below may reference images not displayed]

FINDINGS: Cardiomediastinal silhouette unchanged in size and contour.
Increasing reticular opacities at the right mid and lower lung.
Bullous emphysema of the right apex again noted. Coarsened
interstitial markings on the left persists.

No displaced fracture
IMPRESSION: Increasing reticulonodular opacities of the right lung, concerning
for multifocal infection.

Background of bullous emphysema

## 2021-06-19 ENCOUNTER — Emergency Department
Admission: EM | Admit: 2021-06-19 | Discharge: 2021-06-19 | Disposition: A | Discharge status: Home / Self Care | Payer: Medicare Other | Attending: Emergency Medicine | Admitting: Emergency Medicine

## 2021-06-19 ENCOUNTER — Other Ambulatory Visit: Payer: Self-pay

## 2021-06-19 ENCOUNTER — Encounter: Payer: Self-pay | Admitting: Emergency Medicine

## 2021-06-19 DIAGNOSIS — J45909 Unspecified asthma, uncomplicated: Secondary | ICD-10-CM | POA: Diagnosis not present

## 2021-06-19 DIAGNOSIS — Z79899 Other long term (current) drug therapy: Secondary | ICD-10-CM | POA: Diagnosis not present

## 2021-06-19 DIAGNOSIS — K0889 Other specified disorders of teeth and supporting structures: Secondary | ICD-10-CM

## 2021-06-19 DIAGNOSIS — I10 Essential (primary) hypertension: Secondary | ICD-10-CM | POA: Insufficient documentation

## 2021-06-19 DIAGNOSIS — Z7982 Long term (current) use of aspirin: Secondary | ICD-10-CM | POA: Insufficient documentation

## 2021-06-19 DIAGNOSIS — Z859 Personal history of malignant neoplasm, unspecified: Secondary | ICD-10-CM | POA: Diagnosis not present

## 2021-06-19 DIAGNOSIS — K029 Dental caries, unspecified: Secondary | ICD-10-CM | POA: Insufficient documentation

## 2021-06-19 DIAGNOSIS — F17210 Nicotine dependence, cigarettes, uncomplicated: Secondary | ICD-10-CM | POA: Insufficient documentation

## 2021-06-19 DIAGNOSIS — I2510 Atherosclerotic heart disease of native coronary artery without angina pectoris: Secondary | ICD-10-CM | POA: Diagnosis not present

## 2021-06-19 DIAGNOSIS — K0381 Cracked tooth: Secondary | ICD-10-CM | POA: Diagnosis not present

## 2021-06-19 MED ORDER — PENICILLIN V POTASSIUM 500 MG PO TABS
500.0000 mg | ORAL_TABLET | Freq: Four times a day (QID) | ORAL | 0 refills | Status: AC
Start: 2021-06-19 — End: 2021-06-26

## 2021-06-19 MED ORDER — OXYCODONE-ACETAMINOPHEN 5-325 MG PO TABS
1.0000 | ORAL_TABLET | Freq: Once | ORAL | Status: AC
  Administered 2021-06-19: 1 via ORAL
  Filled 2021-06-19: qty 1

## 2021-06-19 MED ORDER — PENICILLIN V POTASSIUM 500 MG PO TABS
500.0000 mg | ORAL_TABLET | Freq: Once | ORAL | Status: AC
  Administered 2021-06-19: 500 mg via ORAL
  Filled 2021-06-19: qty 1

## 2021-06-19 NOTE — Discharge Instructions (Addendum)
Please take the antibiotic 4 times daily for the next 7 days.  You can take Tylenol for pain.  Please follow-up with a dentist as soon as possible.

## 2021-06-19 NOTE — ED Triage Notes (Signed)
Pt comes into the ED via [facility] c/o dental pain on the left side.  Pt states he has pain on the upper and lower side with new swelling present on his face.  Pt also had a recent procedure completed where he has an incision on his abdomen for a vascular procedure he wants checked while he is here.  Pt in NAD at this time with even and unlabored respirations.

## 2021-06-19 NOTE — ED Provider Notes (Signed)
[facility]  ____________________________________________   Event Date/Time   First MD Initiated Contact with Patient 06/19/21 1156     (approximate)  I have reviewed the triage vital signs and the nursing notes.   HISTORY  Chief Complaint Dental Pain    HPI Timothy Galvan is a 65 y.o. male with past medical history of peripheral artery disease, hypertension, asthma who presents with dental pain.  Patient has a broken tooth in the left upper molar.  He says the tooth cracked several weeks ago and started having pain worsening last night.  Has not taken anything over-the-counter for it.  Denies significant facial swelling.  Does have some pain radiating up to his thigh.  Patient underwent a femorofemoral bypass several weeks ago at [facility].  He notes that the incisions in his groin are irritating to him denies any significant pain or discharge.         Past Medical History:  Diagnosis Date   Asthma    Blood clot in vein    Cancer ([place])    Coronary artery disease    GERD (gastroesophageal reflux disease)    Heart disease    Hypertension    Pneumonia     Patient Active Problem List   Diagnosis Date Noted   Chest pain 01/16/2019   GERD (gastroesophageal reflux disease) 01/10/2018   CAD (coronary artery disease) 01/10/2018   Closed compression fracture of body of L1 vertebra ([place]) 01/10/2018   HLD (hyperlipidemia) 01/10/2018    Past Surgical History:  Procedure Laterality Date   MANDIBLE SURGERY     WRIST SURGERY      Prior to Admission medications   Medication Sig Start Date End Date Taking? Authorizing Provider  penicillin v potassium (VEETID) 500 MG tablet Take 1 tablet (500 mg total) by mouth 4 (four) times daily for 7 days. 06/19/21 06/26/21 Yes Rada Hay, MD  acetaminophen (TYLENOL) 325 MG tablet Take 2 tablets (650 mg total) by mouth every 6 (six) hours as needed for mild pain (or Fever >/= 101). Patient not taking: Reported on  02/04/2019 01/13/18   Loletha Grayer, MD  alendronate (FOSAMAX) 70 MG tablet Take 1 tablet (70 mg total) by mouth once a week. Take with a full glass of water on an empty stomach. Patient not taking: Reported on 01/16/2019 01/13/18   Loletha Grayer, MD  aspirin EC 81 MG tablet Take 81 mg by mouth at bedtime.    [provider]  atorvastatin (LIPITOR) 40 MG tablet Take 40 mg by mouth at bedtime.     [provider]  gabapentin (NEURONTIN) 300 MG capsule Take 600 mg by mouth 3 (three) times daily. 11/21/18   [provider]  HYDROcodone-acetaminophen (NORCO/VICODIN) 5-325 MG tablet Take 1 tablet by mouth every 6 (six) hours as needed for moderate pain. 01/18/19   Vaughan Basta, MD  ibuprofen (ADVIL) 600 MG tablet Take 600 mg by mouth every 4 (four) hours as needed. 02/02/19   [provider]  Ipratropium-Albuterol (COMBIVENT) 20-100 MCG/ACT AERS respimat Inhale 1 puff into the lungs every 6 (six) hours as needed for wheezing.    [provider]  isosorbide mononitrate (IMDUR) 60 MG 24 hr tablet Take 1 tablet (60 mg total) by mouth at bedtime. 02/04/19   [name], MD  latanoprost (XALATAN) 0.005 % ophthalmic solution Place 1 drop into the right eye at bedtime. 01/09/19   [provider]  Melatonin 5 MG TABS Take 5 mg by  mouth at bedtime.    [provider]  meloxicam (MOBIC) 15 MG tablet Take 1 tablet (15 mg total) by mouth daily. Patient not taking: Reported on 01/16/2019 01/13/18   Loletha Grayer, MD  metoprolol tartrate (LOPRESSOR) 25 MG tablet Take 1 tablet (25 mg total) by mouth 2 (two) times daily. 01/18/19   Vaughan Basta, MD  nitroGLYCERIN (NITROSTAT) 0.4 MG SL tablet Place 1 tablet (0.4 mg total) under the tongue every 5 (five) minutes as needed for chest pain. 02/04/19   [name], MD  omeprazole (PRILOSEC) 20 MG capsule Take 20 mg by mouth at bedtime.     [provider]  Saccharomyces boulardii  (PROBIOTIC) 250 MG CAPS Take 1 capsule by mouth 3 (three) times daily. 01/18/19   Vaughan Basta, MD    Allergies Penicillins  History reviewed. No pertinent family history.  Social History Social History   Tobacco Use   Smoking status: Every Day    Packs/day: 1.00    Types: Cigarettes   Smokeless tobacco: Never  Substance Use Topics   Alcohol use: Yes    Alcohol/week: 12.0 standard drinks    Types: 12 Cans of beer per week   Drug use: Yes    Types: Cocaine    Review of Systems   Review of Systems  Constitutional:  Negative for fever.  HENT:  Positive for dental problem. Negative for facial swelling.   All other systems reviewed and are negative.  Physical Exam Updated Vital Signs BP 107/70 (BP Location: Left Arm)   Pulse 94   Temp 98.4 F (36.9 C) (Oral)   Resp 18   Ht 5\' 3"  (1.6 m)   Wt 59 kg   SpO2 92%   BMI 23.04 kg/m   Physical Exam Vitals and nursing note reviewed.  Constitutional:      General: He is not in acute distress.    Appearance: Normal appearance.  HENT:     Head: Normocephalic and atraumatic.     Comments: Very poor dentition with multiple missing teeth and dental caries, there is a broken tooth in the left upper region that is tender to palpation No obvious fluctuance in the gums No facial swelling Eyes:     General: No scleral icterus.    Conjunctiva/sclera: Conjunctivae normal.  Pulmonary:     Effort: Pulmonary effort is normal. No respiratory distress.     Breath sounds: Normal breath sounds. No wheezing.  Genitourinary:    Comments: Bilateral groin incisions appear well-healed, nontender, no fluctuance or erythema Musculoskeletal:        General: No deformity or signs of injury.     Cervical back: Normal range of motion.  Skin:    Coloration: Skin is not jaundiced or pale.  Neurological:     General: No focal deficit present.     Mental Status: He is alert and oriented to person, place, and time. Mental status is at  baseline.  Psychiatric:        Mood and Affect: Mood normal.        Behavior: Behavior normal.     LABS (all labs ordered are listed, but only abnormal results are displayed)  Labs Reviewed - No data to display ____________________________________________  EKG  N/a ____________________________________________  RADIOLOGY Almeta Monas, personally viewed and evaluated these images (plain radiographs) as part of my medical decision making, as well as reviewing the written report by the radiologist.  ED MD interpretation:  n/a    ____________________________________________  PROCEDURES  Procedure(s) performed (including Critical Care):  Procedures   ____________________________________________   INITIAL IMPRESSION / ASSESSMENT AND PLAN / ED COURSE     65 year old male presents with dental pain from a cracked tooth.  He has no facial swelling fevers or chills.  On exam there is a broken tooth with significant tenderness to percussion.  No significant fluctuance or facial swelling associated.  Suspect developing periapical abscess.  Will treat with antibiotics.  Patient has history of peptic ulcer disease so will not give NSAIDs.  Advised to follow-up with [facility] dental.  Patient also here because he is having some irritation at surgical sites of his recent femorofemoral bypass.  The wounds appear well-healed and there is no signs of infection.  Advised to follow-up with his surgeon as scheduled and I see that he saw them 2 days ago and there was no concerns.  He is stable for discharge.      ____________________________________________   FINAL CLINICAL IMPRESSION(S) / ED DIAGNOSES  Final diagnoses:  Pain, dental     ED Discharge Orders          Ordered    penicillin v potassium (VEETID) 500 MG tablet  4 times daily        06/19/21 1218             Note:  This document was prepared using Dragon voice recognition software and may include  unintentional dictation errors.    Rada Hay, MD 06/19/21 [phone]

## 2021-08-02 ENCOUNTER — Other Ambulatory Visit: Payer: Self-pay

## 2021-08-02 ENCOUNTER — Emergency Department
Admission: EM | Admit: 2021-08-02 | Discharge: 2021-08-02 | Disposition: A | Discharge status: Home / Self Care | Payer: Medicare Other | Attending: Emergency Medicine | Admitting: Emergency Medicine

## 2021-08-02 DIAGNOSIS — Z131 Encounter for screening for diabetes mellitus: Secondary | ICD-10-CM | POA: Diagnosis not present

## 2021-08-02 DIAGNOSIS — K047 Periapical abscess without sinus: Secondary | ICD-10-CM | POA: Insufficient documentation

## 2021-08-02 DIAGNOSIS — K0889 Other specified disorders of teeth and supporting structures: Secondary | ICD-10-CM | POA: Diagnosis present

## 2021-08-02 LAB — CBG MONITORING, ED: Glucose-Capillary: 121 mg/dL — ABNORMAL HIGH (ref 70–99)

## 2021-08-02 MED ORDER — TRAMADOL HCL 50 MG PO TABS
50.0000 mg | ORAL_TABLET | Freq: Four times a day (QID) | ORAL | 0 refills | Status: DC | PRN
Start: 2021-08-02 — End: 2022-06-05

## 2021-08-02 MED ORDER — CLINDAMYCIN HCL 150 MG PO CAPS: 300.0000 mg | ORAL_CAPSULE | Freq: Three times a day (TID) | ORAL | 0 refills | Status: DC

## 2021-08-02 NOTE — ED Triage Notes (Addendum)
Pt c/o right upper and lower dental pain x3 days. Pt states he broke a tooth off the upper and lower jaw and needs surgery. Pt states he tried tylenol w/ out pain relief. Pt c/o gum pain. No obvious swelling of the right cheek/mandible noted. Pt has not seen dentist.

## 2021-08-02 NOTE — ED Provider Notes (Signed)
[facility] Provider Note    Event Date/Time   First MD Initiated Contact with Patient 08/02/21 1340     (approximate)   History   Dental Pain   HPI  Timothy Galvan is a 66 y.o. male presents emergency department complaining of right-sided tooth pain and some facial swelling.  No fever or chills.  Patient states he has some broken teeth with bad cavities on the right upper and right lower side.  Has not called or seen his dentist for the same complaint.  Has been ongoing for 3 days      Physical Exam   Triage Vital Signs: ED Triage Vitals [08/02/21 1139]  Enc Vitals Group     BP 120/72     Pulse Rate 78     Resp 17     Temp 98.5 F (36.9 C)     Temp Source Oral     SpO2 97 %     Weight      Height      Head Circumference      Peak Flow      Pain Score 10     Pain Loc      Pain Edu?      Excl. in [place]?     Most recent vital signs: Vitals:   08/02/21 1139  BP: 120/72  Pulse: 78  Resp: 17  Temp: 98.5 F (36.9 C)  SpO2: 97%     General: Awake, no distress.   CV:  Good peripheral perfusion. regular rate and  rhythm Resp:  Normal effort. Lungs CTA Abd:  No distention.   Other:  Small amount facial swelling noted on the right side, poor dentition noted, multiple caries noted, receding gums also noted   ED Results / Procedures / Treatments   Labs (all labs ordered are listed, but only abnormal results are displayed) Labs Reviewed  CBG MONITORING, ED - Abnormal; Notable for the following components:      Result Value   Glucose-Capillary 121 (*)    All other components within normal limits     EKG     RADIOLOGY     PROCEDURES:   Procedures   MEDICATIONS ORDERED IN ED: Medications - No data to display   IMPRESSION / MDM / [place] / ED COURSE  I reviewed the triage vital signs and the nursing notes.                              Differential diagnosis includes, but is not limited to, dental  abscess, dental caries, facial abscess  I did explain the findings to the patient.  I feel that we can try him on outpatient clindamycin.  He was also given a prescription for tramadol for pain.  He is to follow-up with the dental clinic at [facility] or return emergency department if worsening.  Patient is in agreement treatment plan.  He was discharged in stable condition.       FINAL CLINICAL IMPRESSION(S) / ED DIAGNOSES   Final diagnoses:  Dental abscess     Rx / DC Orders   ED Discharge Orders          Ordered    clindamycin (CLEOCIN) 150 MG capsule  3 times daily        08/02/21 1348    traMADol (ULTRAM) 50 MG tablet  Every 6 hours PRN  08/02/21 1348             Note:  This document was prepared using Dragon voice recognition software and may include unintentional dictation errors.    Versie Starks, PA-C 08/02/21 1521    Vladimir Crofts, MD 08/02/21 [phone]

## 2021-08-02 NOTE — Discharge Instructions (Signed)
Follow-up with [facility].  Please call for an appointment.  Take medication as prescribed.  Return emergency department if worsening. You may also take Tylenol and ibuprofen

## 2022-03-07 ENCOUNTER — Emergency Department (HOSPITAL_COMMUNITY): Payer: Medicare Other

## 2022-03-07 ENCOUNTER — Emergency Department (HOSPITAL_COMMUNITY)
Admission: EM | Admit: 2022-03-07 | Discharge: 2022-03-08 | Disposition: A | Discharge status: Home / Self Care | Payer: Medicare Other | Attending: Emergency Medicine | Admitting: Emergency Medicine

## 2022-03-07 ENCOUNTER — Encounter (HOSPITAL_COMMUNITY): Payer: Self-pay

## 2022-03-07 DIAGNOSIS — Z20822 Contact with and (suspected) exposure to covid-19: Secondary | ICD-10-CM | POA: Diagnosis not present

## 2022-03-07 DIAGNOSIS — R55 Syncope and collapse: Secondary | ICD-10-CM

## 2022-03-07 DIAGNOSIS — J45909 Unspecified asthma, uncomplicated: Secondary | ICD-10-CM | POA: Diagnosis not present

## 2022-03-07 DIAGNOSIS — I10 Essential (primary) hypertension: Secondary | ICD-10-CM | POA: Insufficient documentation

## 2022-03-07 DIAGNOSIS — I2510 Atherosclerotic heart disease of native coronary artery without angina pectoris: Secondary | ICD-10-CM | POA: Diagnosis not present

## 2022-03-07 DIAGNOSIS — R4182 Altered mental status, unspecified: Secondary | ICD-10-CM

## 2022-03-07 DIAGNOSIS — Z79899 Other long term (current) drug therapy: Secondary | ICD-10-CM | POA: Insufficient documentation

## 2022-03-07 DIAGNOSIS — Z7982 Long term (current) use of aspirin: Secondary | ICD-10-CM | POA: Diagnosis not present

## 2022-03-07 DIAGNOSIS — I6521 Occlusion and stenosis of right carotid artery: Secondary | ICD-10-CM

## 2022-03-07 LAB — CBC
HCT: 46 % (ref 39.0–52.0)
Hemoglobin: 14.9 g/dL (ref 13.0–17.0)
MCH: 29.4 pg (ref 26.0–34.0)
MCHC: 32.4 g/dL (ref 30.0–36.0)
MCV: 90.9 fL (ref 80.0–100.0)
Platelets: 313 10*3/uL (ref 150–400)
RBC: 5.06 MIL/uL (ref 4.22–5.81)
RDW: 14.6 % (ref 11.5–15.5)
WBC: 9.4 10*3/uL (ref 4.0–10.5)
nRBC: 0 % (ref 0.0–0.2)

## 2022-03-07 LAB — URINALYSIS, ROUTINE W REFLEX MICROSCOPIC
Bacteria, UA: NONE SEEN
Bilirubin Urine: NEGATIVE
Glucose, UA: NEGATIVE mg/dL
Hgb urine dipstick: NEGATIVE
Ketones, ur: 5 mg/dL — AB
Leukocytes,Ua: NEGATIVE
Nitrite: NEGATIVE
Protein, ur: 30 mg/dL — AB
Specific Gravity, Urine: 1.01 (ref 1.005–1.030)
pH: 6 (ref 5.0–8.0)

## 2022-03-07 LAB — AMMONIA: Ammonia: 13 umol/L (ref 9–35)

## 2022-03-07 LAB — BASIC METABOLIC PANEL
Anion gap: 9 (ref 5–15)
BUN: 15 mg/dL (ref 8–23)
CO2: 22 mmol/L (ref 22–32)
Calcium: 9.3 mg/dL (ref 8.9–10.3)
Chloride: 107 mmol/L (ref 98–111)
Creatinine, Ser: 1.67 mg/dL — ABNORMAL HIGH (ref 0.61–1.24)
GFR, Estimated: 45 mL/min — ABNORMAL LOW (ref >60)
Glucose, Bld: 116 mg/dL — ABNORMAL HIGH (ref 70–99)
Potassium: 4.5 mmol/L (ref 3.5–5.1)
Sodium: 138 mmol/L (ref 135–145)

## 2022-03-07 LAB — SARS CORONAVIRUS 2 BY RT PCR: SARS Coronavirus 2 by RT PCR: NEGATIVE

## 2022-03-07 LAB — HEPATIC FUNCTION PANEL
ALT: 20 U/L (ref 0–44)
AST: 16 U/L (ref 15–41)
Albumin: 3.6 g/dL (ref 3.5–5.0)
Alkaline Phosphatase: 67 U/L (ref 38–126)
Bilirubin, Direct: 0.2 mg/dL (ref 0.0–0.2)
Indirect Bilirubin: 1.2 mg/dL — ABNORMAL HIGH (ref 0.3–0.9)
Total Bilirubin: 1.4 mg/dL — ABNORMAL HIGH (ref 0.3–1.2)
Total Protein: 7.1 g/dL (ref 6.5–8.1)

## 2022-03-07 LAB — RAPID URINE DRUG SCREEN, HOSP PERFORMED
Amphetamines: NOT DETECTED
Barbiturates: NOT DETECTED
Benzodiazepines: NOT DETECTED
Cocaine: POSITIVE — AB
Opiates: NOT DETECTED
Tetrahydrocannabinol: NOT DETECTED

## 2022-03-07 LAB — TROPONIN I (HIGH SENSITIVITY)
Troponin I (High Sensitivity): 20 ng/L — ABNORMAL HIGH (ref <18)
Troponin I (High Sensitivity): 13 ng/L (ref <18)

## 2022-03-07 LAB — CBG MONITORING, ED: Glucose-Capillary: 103 mg/dL — ABNORMAL HIGH (ref 70–99)

## 2022-03-07 LAB — PROTIME-INR
INR: 1 (ref 0.8–1.2)
Prothrombin Time: 12.9 seconds (ref 11.4–15.2)

## 2022-03-07 LAB — LACTIC ACID, PLASMA: Lactic Acid, Venous: 1.7 mmol/L (ref 0.5–1.9)

## 2022-03-07 LAB — APTT: aPTT: 25 seconds (ref 24–36)

## 2022-03-07 LAB — ETHANOL: Alcohol, Ethyl (B): <10 mg/dL (ref <10)

## 2022-03-07 MED ORDER — SODIUM CHLORIDE 0.9 % IV SOLN: INTRAVENOUS | Status: DC

## 2022-03-07 MED ORDER — SODIUM CHLORIDE 0.9 % IV BOLUS
1000.0000 mL | Freq: Once | INTRAVENOUS | Status: AC
  Administered 2022-03-07: 1000 mL via INTRAVENOUS

## 2022-03-07 MED ORDER — IOHEXOL 350 MG/ML SOLN
75.0000 mL | Freq: Once | INTRAVENOUS | Status: AC | PRN
  Administered 2022-03-07: 75 mL via INTRAVENOUS

## 2022-03-07 MED ORDER — KETOROLAC TROMETHAMINE 30 MG/ML IJ SOLN
30.0000 mg | Freq: Once | INTRAMUSCULAR | Status: AC
  Administered 2022-03-07: 30 mg via INTRAVENOUS
  Filled 2022-03-07: qty 1

## 2022-03-07 NOTE — ED Notes (Signed)
Pt states during triage that he recently used cocaine. Denies any other drug use.

## 2022-03-07 NOTE — Plan of Care (Signed)
Discussed with Dr. Langston Masker.  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 No hemorrhage. Consider MRI for further evaluation as indicated. 2. Atrophy and chronic small-vessel white matter ischemic changes. 3. No static evidence of acute injury to the cervical spine.  CTA personally reviewed, agree with radiology:   1. 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 2. Severe narrowing of the distal cavernous segment of the left internal carotid artery. 3. Right middle and anterior cerebral arteries are supplied by collateral flow from the circle of Willis. 4. Aortic Atherosclerosis (ICD10-I70.0) and Emphysema (ICD10-J43.9).  Please note on review of records from [facility] (though I cannot review source images), both the right carotid occlusion as well as the right frontal/parietal stroke have been previously noted  MRI brain pending  From a neurological perspective patient can have close outpatient follow-up if these studies are negative; he is already optimized from a TIA perspective given he is on chronic dual antiplatelet therapy secondary to his peripheral vascular disease.  If MRI brain is positive for an embolic appearing stroke will need admission for echocardiogram and consideration of initiation of anticoagulation  If additional neurological questions or concerns arise or if these studies are positive, please reach out for full consultation, neurology is happy to assist  Lesleigh Noe MD-PhD [facility] [phone] Available 7 PM to 7 AM, outside of these hours please call Neurologist on call as listed on Amion.

## 2022-03-07 NOTE — ED Notes (Signed)
Food and drink provider per MD.

## 2022-03-07 NOTE — ED Provider Notes (Signed)
[facility] EMERGENCY DEPARTMENT Provider Note   CSN: [id number] Arrival date & time: 03/07/22  1623     History  Chief Complaint  Patient presents with   Altered Mental Status   Loss of Consciousness    Timothy Galvan is a 66 y.o. male with history of CAD, GERD, HLD, asthma, HTN, and polysubstance abuse.  Presenting today via EMS due to unwitnessed syncopal event.  Patient remembers hitting his head on the ground due to syncope, however unable to recollect details on how he was brought to the ED.  Endorses intermittent chest pain over the last few weeks, worsening over the last few days.  States that usually stays in the left chest, and on occasion radiates into the left arm.  Unknown aggravators or relievers.  Has been taking 2-3 tabs of sublingual nitroglycerin for the chest pain over the last few days, however denies taking them earlier today.  Also admits to crack use over the last 3 to 5 days, however denies other recreational drug use at this time.  Denies fevers, chills, abdominal pain, or changes in urinary or bowel habits.  Does not recall vomiting in the ambulance on the way to the ED.  Per EMS systolic blood pressure of 80 in route to the ED.  History limited due to patient's mental status.  The history is provided by the patient, medical records and the EMS personnel.  Altered Mental Status Loss of Consciousness      Home Medications Prior to Admission medications   Medication Sig Start Date End Date Taking? Authorizing Provider  acetaminophen (TYLENOL) 325 MG tablet Take 2 tablets (650 mg total) by mouth every 6 (six) hours as needed for mild pain (or Fever >/= 101). Patient not taking: Reported on 02/04/2019 01/13/18   Loletha Grayer, MD  alendronate (FOSAMAX) 70 MG tablet Take 1 tablet (70 mg total) by mouth once a week. Take with a full glass of water on an empty stomach. Patient not taking: Reported on 01/16/2019 01/13/18   Loletha Grayer, MD  aspirin EC 81 MG tablet  Take 81 mg by mouth at bedtime.    [provider]  atorvastatin (LIPITOR) 40 MG tablet Take 40 mg by mouth at bedtime.     [provider]  clindamycin (CLEOCIN) 150 MG capsule Take 2 capsules (300 mg total) by mouth 3 (three) times daily. 08/02/21   Fisher, Linden Dolin, PA-C  gabapentin (NEURONTIN) 300 MG capsule Take 600 mg by mouth 3 (three) times daily. 11/21/18   [provider]  HYDROcodone-acetaminophen (NORCO/VICODIN) 5-325 MG tablet Take 1 tablet by mouth every 6 (six) hours as needed for moderate pain. 01/18/19   Vaughan Basta, MD  ibuprofen (ADVIL) 600 MG tablet Take 600 mg by mouth every 4 (four) hours as needed. 02/02/19   [provider]  Ipratropium-Albuterol (COMBIVENT) 20-100 MCG/ACT AERS respimat Inhale 1 puff into the lungs every 6 (six) hours as needed for wheezing.    [provider]  isosorbide mononitrate (IMDUR) 60 MG 24 hr tablet Take 1 tablet (60 mg total) by mouth at bedtime. 02/04/19   [name], MD  latanoprost (XALATAN) 0.005 % ophthalmic solution Place 1 drop into the right eye at bedtime. 01/09/19   [provider]  Melatonin 5 MG TABS Take 5 mg by mouth at bedtime.    [provider]  meloxicam (MOBIC) 15 MG tablet Take 1 tablet (15 mg total) by mouth daily. Patient not taking: Reported on 01/16/2019 01/13/18  Loletha Grayer, MD  metoprolol tartrate (LOPRESSOR) 25 MG tablet Take 1 tablet (25 mg total) by mouth 2 (two) times daily. 01/18/19   Vaughan Basta, MD  nitroGLYCERIN (NITROSTAT) 0.4 MG SL tablet Place 1 tablet (0.4 mg total) under the tongue every 5 (five) minutes as needed for chest pain. 02/04/19   [name], MD  omeprazole (PRILOSEC) 20 MG capsule Take 20 mg by mouth at bedtime.     [provider]  Saccharomyces boulardii (PROBIOTIC) 250 MG CAPS Take 1 capsule by mouth 3 (three) times daily. 01/18/19   Vaughan Basta, MD  traMADol (ULTRAM) 50 MG tablet Take 1  tablet (50 mg total) by mouth every 6 (six) hours as needed. 08/02/21   Versie Starks, PA-C      Allergies    Penicillins    Review of Systems   Review of Systems  Unable to perform ROS: Mental status change  Cardiovascular:  Positive for syncope.    Physical Exam Updated Vital Signs BP (!) 147/64   Pulse 67   Temp 97.6 F (36.4 C) (Axillary)   Resp 16   Ht '5\' 3"'$  (1.6 m)   Wt 61.2 kg   SpO2 94%   BMI 23.91 kg/m  Physical Exam Vitals and nursing note reviewed.  Constitutional:      General: He is not in acute distress.    Appearance: He is well-developed. He is ill-appearing. He is not toxic-appearing or diaphoretic.  HENT:     Head: Normocephalic and atraumatic.     Comments: No evidence of battle signs or raccoon eyes.  Mild tenderness of the right side of the face, without significant edema, erythema, wound, or obvious deformity.    Right Ear: External ear normal.     Left Ear: External ear normal.     Nose: Nose normal.     Mouth/Throat:     Mouth: Mucous membranes are moist.     Pharynx: Oropharynx is clear.     Comments: Poor dentition overall, most of upper teeth are surgically absent, airway patent Eyes:     General:        Right eye: No discharge.        Left eye: No discharge.     Conjunctiva/sclera: Conjunctivae normal.     Pupils: Pupils are equal, round, and reactive to light.  Cardiovascular:     Rate and Rhythm: Normal rate and regular rhythm.     Pulses: Normal pulses.     Heart sounds: Normal heart sounds. No murmur heard. Pulmonary:     Effort: Pulmonary effort is normal. No respiratory distress.     Breath sounds: Normal breath sounds. No wheezing.     Comments: CTAB, able to communicate without difficulty, without increased respiratory effort Chest:     Chest wall: No tenderness.     Comments: Non-TTP Abdominal:     General: There is no distension.     Palpations: Abdomen is soft.     Tenderness: There is no abdominal tenderness. There is  no right CVA tenderness, left CVA tenderness or guarding.     Comments: Non-TTP  Musculoskeletal:        General: No swelling or deformity.     Cervical back: Neck supple. No rigidity.  Skin:    General: Skin is warm and dry.     Capillary Refill: Capillary refill takes less than 2 seconds.     Coloration: Skin is not jaundiced or pale.     Findings:  No erythema or rash.  Neurological:     General: No focal deficit present.     Mental Status: He is alert. He is disoriented.     GCS: GCS eye subscore is 4. GCS verbal subscore is 5. GCS motor subscore is 6.     Cranial Nerves: No dysarthria or facial asymmetry.     Motor: No weakness, tremor or seizure activity.     Coordination: Coordination abnormal (Of all four extremities).     Comments: Unable to follow well commands due to disorientation.  Oriented to self and event, disoriented to time and place.  Psychiatric:        Mood and Affect: Mood normal.     ED Results / Procedures / Treatments   Labs (all labs ordered are listed, but only abnormal results are displayed) Labs Reviewed  BASIC METABOLIC PANEL - Abnormal; Notable for the following components:      Result Value   Glucose, Bld 116 (*)    Creatinine, Ser 1.67 (*)    GFR, Estimated 45 (*)    All other components within normal limits  CBG MONITORING, ED - Abnormal; Notable for the following components:   Glucose-Capillary 103 (*)    All other components within normal limits  TROPONIN I (HIGH SENSITIVITY) - Abnormal; Notable for the following components:   Troponin I (High Sensitivity) 20 (*)    All other components within normal limits  SARS CORONAVIRUS 2 BY RT PCR  CBC  LACTIC ACID, PLASMA  PROTIME-INR  APTT  URINALYSIS, ROUTINE W REFLEX MICROSCOPIC  RAPID URINE DRUG SCREEN, HOSP PERFORMED  AMMONIA  ETHANOL  HEPATIC FUNCTION PANEL  CBG MONITORING, ED  TROPONIN I (HIGH SENSITIVITY)    EKG EKG Interpretation  Date/Time:  Saturday March 07 2022 17:34:38  EDT Ventricular Rate:  78 PR Interval:  189 QRS Duration: 86 QT Interval:  461 QTC Calculation: 526 R Axis:   83 Text Interpretation: Sinus rhythm Atrial premature complexes Borderline right axis deviation Nonspecific T abnormalities, lateral leads Prolonged QT interval No sig change from Aug 02 2021 Confirmed by Octaviano Glow [id number]) on 03/07/2022 6:08:05 PM  Radiology No results found.  Procedures Procedures    Medications Ordered in ED Medications  sodium chloride 0.9 % bolus 1,000 mL (1,000 mLs Intravenous New Bag/Given 03/07/22 1735)    And  0.9 %  sodium chloride infusion (has no administration in time range)    ED Course/ Medical Decision Making/ A&P Clinical Course as of 03/07/22 1832  Sat Mar 07, 2022  1805 66 yo male here with AMS, falls at home, struck head after fall - vomited en route to hospital, some "seizure like activity" reported by EMS.  Pt reporting crack cocaine use the past several days as well. GCS 15 on arrival, but confused, afebrile - pending labs, UDS, [facility] [[place]]    Clinical Course User Index [MT] Trifan, Carola Rhine, MD                           Medical Decision Making Amount and/or Complexity of Data Reviewed Labs: ordered.  Risk Prescription drug management.   66 y.o. male presents to the ED for concern of Altered Mental Status and Loss of Consciousness     This involves an extensive number of treatment options, and is a complaint that carries with it a high risk of complications and morbidity.    Past Medical History / Co-morbidities / Social History: Hx of  GERD, CAD, HLD, asthma Social Determinants of Health include: Polysubstance abuse, for cessation counseling was provided  Additional History:  None  Lab Tests: I ordered, and personally interpreted labs.  The pertinent results include:   Troponins: Initial 20 Lactic acid 1.7 CBG 103 PT/INR and APTT unremarkable UDS: Pending Creatinine 1.67, BUN 15, GFR 45 Remaining labs  pending  Imaging Studies: I ordered imaging studies including CT head and neck-results pending  Cardiac Monitoring: The patient was maintained on a cardiac monitor.  I personally viewed and interpreted the cardiac monitored which showed an underlying rhythm of: Sinus rhythm with ventricular rate of 78 without evidence of ischemia or infarction  ED Course / Critical Interventions: Pt ill-appearing on exam.  Presenting via EMS due to unwitnessed syncopal event.  Patient remembers hitting his head on the ground due to syncope, however unable to recollect details on how he was brought to the ED.  With intermittent chest pain over the last few weeks, worsening over the last few days.  Localized to left chest, and on occasion radiates into left arm.  Has been taking 2-3 tabs of sublingual nitroglycerin for the chest pain over the last few days, however denies taking them earlier today.  Also admits to crack/cocaine use over the last 3 to 5 days, however denies other recreational drug use at this time.  Afebrile.  Without abdominal pain or changes in urinary or bowel habits.  Does not recall vomiting in the ambulance on the way to the ED.  Per EMS systolic blood pressure of 80 in route to the ED. Electrolytes unremarkable.  Without evidence of acute liver failure.  Initial troponin of 20, suggestive of heart strain.  CBG 116 without hx of diabetes mellitus, doubt glycemic etiology.  At this time, still no clear cause of altered mental status or syncope.  Work-up still in progress.  Disposition: 1900 care of Timothy Galvan transferred to [facility] and Dr. Langston Masker at the end of my shift as the patient will require reassessment once labs/imaging have resulted.  Patient presentation, ED course, and plan of care discussed with review of all pertinent labs and imaging.  Please see his/her note for further details regarding further ED course and disposition.  Plan at time of handoff is still pending further  workup, differential of altered mental status still broad at this time.  Imaging and labs pending.  This may be altered or completely changed at the discretion of the oncoming team pending results of further workup.  I discussed this case with my attending, Dr. Langston Masker, who agreed with the proposed treatment course and cosigned this note including patient's presenting symptoms, physical exam, and planned diagnostics and interventions.  Attending physician stated agreement with plan or made changes to plan which were implemented.     This chart was dictated using voice recognition software.  Despite best efforts to proofread, errors can occur which can change the documentation meaning.         Final Clinical Impression(s) / ED Diagnoses Final diagnoses:  Syncope and collapse  Altered mental status, unspecified altered mental status type    Rx / DC Orders ED Discharge Orders     None         Candace Cruise [birth year] 2009    Wyvonnia Dusky, MD 03/07/22 2337

## 2022-03-07 NOTE — ED Triage Notes (Signed)
Pt arrives via [facility] for unwitnessed syncopal episode. Pt endorses hitting his head on fall. Pt became increasingly altered enroute per EMS, had second syncopal versus seizure like episode. CBG 113. SBP enroute 80. HR 80.   Pt endorses multiple doses of nitroglycerin at home each of the last three days to control chest pain. Pt denies CP, endorses SOB.

## 2022-03-08 ENCOUNTER — Encounter (HOSPITAL_COMMUNITY): Payer: Self-pay | Admitting: Radiology

## 2022-03-08 ENCOUNTER — Emergency Department (HOSPITAL_COMMUNITY): Payer: Medicare Other

## 2022-03-08 DIAGNOSIS — C801 Malignant (primary) neoplasm, unspecified: Secondary | ICD-10-CM | POA: Insufficient documentation

## 2022-03-08 DIAGNOSIS — I8290 Acute embolism and thrombosis of unspecified vein: Secondary | ICD-10-CM | POA: Insufficient documentation

## 2022-03-08 DIAGNOSIS — R55 Syncope and collapse: Secondary | ICD-10-CM | POA: Diagnosis not present

## 2022-03-08 NOTE — ED Notes (Signed)
Patient presents to [facility] with only shoes and belt. Patinet provided with paper scrubs upon d/c

## 2022-03-08 NOTE — ED Notes (Signed)
Patient provided with [place] sandwich, apple sauce, and ginger ale

## 2022-03-08 NOTE — ED Provider Notes (Signed)
MRI without any acute stroke.  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  Will have patient follow-up as an outpatient for neurology recommendations.   Merryl Hacker, MD 03/08/22 [phone]

## 2022-03-08 NOTE — Discharge Instructions (Addendum)
Your MRI today does not demonstrate an acute stroke.  You do have evidence of blockage in your right internal carotid artery.  Follow-up as previously directed.  Continue medications as prescribed.

## 2022-06-01 ENCOUNTER — Encounter (HOSPITAL_COMMUNITY): Payer: Self-pay | Admitting: *Deleted

## 2022-06-01 ENCOUNTER — Emergency Department (HOSPITAL_COMMUNITY): Payer: Medicare Other

## 2022-06-01 ENCOUNTER — Other Ambulatory Visit: Payer: Self-pay

## 2022-06-01 ENCOUNTER — Inpatient Hospital Stay (HOSPITAL_COMMUNITY): Payer: Medicare Other

## 2022-06-01 ENCOUNTER — Inpatient Hospital Stay (HOSPITAL_COMMUNITY)
Admission: EM | Admit: 2022-06-01 | Discharge: 2022-06-05 | DRG: 065 | Disposition: A | Discharge status: Rehabilitation Facility | Payer: Medicare Other | Attending: Family Medicine | Admitting: Family Medicine

## 2022-06-01 DIAGNOSIS — I69322 Dysarthria following cerebral infarction: Secondary | ICD-10-CM | POA: Diagnosis not present

## 2022-06-01 DIAGNOSIS — J42 Unspecified chronic bronchitis: Secondary | ICD-10-CM | POA: Diagnosis not present

## 2022-06-01 DIAGNOSIS — F1010 Alcohol abuse, uncomplicated: Secondary | ICD-10-CM | POA: Diagnosis present

## 2022-06-01 DIAGNOSIS — Z79899 Other long term (current) drug therapy: Secondary | ICD-10-CM | POA: Diagnosis not present

## 2022-06-01 DIAGNOSIS — J4489 Other specified chronic obstructive pulmonary disease: Secondary | ICD-10-CM | POA: Diagnosis present

## 2022-06-01 DIAGNOSIS — G8929 Other chronic pain: Secondary | ICD-10-CM | POA: Diagnosis present

## 2022-06-01 DIAGNOSIS — E876 Hypokalemia: Secondary | ICD-10-CM | POA: Diagnosis present

## 2022-06-01 DIAGNOSIS — R739 Hyperglycemia, unspecified: Secondary | ICD-10-CM | POA: Diagnosis present

## 2022-06-01 DIAGNOSIS — I5022 Chronic systolic (congestive) heart failure: Secondary | ICD-10-CM | POA: Diagnosis present

## 2022-06-01 DIAGNOSIS — C189 Malignant neoplasm of colon, unspecified: Secondary | ICD-10-CM

## 2022-06-01 DIAGNOSIS — Z85038 Personal history of other malignant neoplasm of large intestine: Secondary | ICD-10-CM | POA: Diagnosis not present

## 2022-06-01 DIAGNOSIS — I6523 Occlusion and stenosis of bilateral carotid arteries: Secondary | ICD-10-CM | POA: Diagnosis present

## 2022-06-01 DIAGNOSIS — I110 Hypertensive heart disease with heart failure: Secondary | ICD-10-CM | POA: Diagnosis present

## 2022-06-01 DIAGNOSIS — E785 Hyperlipidemia, unspecified: Secondary | ICD-10-CM

## 2022-06-01 DIAGNOSIS — Z7983 Long term (current) use of bisphosphonates: Secondary | ICD-10-CM | POA: Diagnosis not present

## 2022-06-01 DIAGNOSIS — I2510 Atherosclerotic heart disease of native coronary artery without angina pectoris: Secondary | ICD-10-CM | POA: Diagnosis present

## 2022-06-01 DIAGNOSIS — I739 Peripheral vascular disease, unspecified: Secondary | ICD-10-CM

## 2022-06-01 DIAGNOSIS — F419 Anxiety disorder, unspecified: Secondary | ICD-10-CM | POA: Diagnosis present

## 2022-06-01 DIAGNOSIS — F17210 Nicotine dependence, cigarettes, uncomplicated: Secondary | ICD-10-CM | POA: Diagnosis present

## 2022-06-01 DIAGNOSIS — I6300 Cerebral infarction due to thrombosis of unspecified precerebral artery: Secondary | ICD-10-CM | POA: Diagnosis not present

## 2022-06-01 DIAGNOSIS — G8194 Hemiplegia, unspecified affecting left nondominant side: Secondary | ICD-10-CM | POA: Diagnosis present

## 2022-06-01 DIAGNOSIS — G4700 Insomnia, unspecified: Secondary | ICD-10-CM | POA: Diagnosis present

## 2022-06-01 DIAGNOSIS — I429 Cardiomyopathy, unspecified: Secondary | ICD-10-CM | POA: Diagnosis present

## 2022-06-01 DIAGNOSIS — I6389 Other cerebral infarction: Secondary | ICD-10-CM | POA: Diagnosis not present

## 2022-06-01 DIAGNOSIS — Z7982 Long term (current) use of aspirin: Secondary | ICD-10-CM

## 2022-06-01 DIAGNOSIS — R1310 Dysphagia, unspecified: Secondary | ICD-10-CM | POA: Diagnosis present

## 2022-06-01 DIAGNOSIS — R1312 Dysphagia, oropharyngeal phase: Secondary | ICD-10-CM | POA: Diagnosis present

## 2022-06-01 DIAGNOSIS — I69318 Other symptoms and signs involving cognitive functions following cerebral infarction: Secondary | ICD-10-CM | POA: Diagnosis not present

## 2022-06-01 DIAGNOSIS — K219 Gastro-esophageal reflux disease without esophagitis: Secondary | ICD-10-CM | POA: Diagnosis present

## 2022-06-01 DIAGNOSIS — I639 Cerebral infarction, unspecified: Secondary | ICD-10-CM | POA: Diagnosis present

## 2022-06-01 DIAGNOSIS — F1410 Cocaine abuse, uncomplicated: Secondary | ICD-10-CM | POA: Diagnosis present

## 2022-06-01 DIAGNOSIS — I10 Essential (primary) hypertension: Secondary | ICD-10-CM | POA: Diagnosis present

## 2022-06-01 DIAGNOSIS — R2689 Other abnormalities of gait and mobility: Secondary | ICD-10-CM | POA: Diagnosis present

## 2022-06-01 DIAGNOSIS — Z880 Allergy status to penicillin: Secondary | ICD-10-CM | POA: Diagnosis not present

## 2022-06-01 DIAGNOSIS — J449 Chronic obstructive pulmonary disease, unspecified: Secondary | ICD-10-CM | POA: Insufficient documentation

## 2022-06-01 DIAGNOSIS — I6381 Other cerebral infarction due to occlusion or stenosis of small artery: Principal | ICD-10-CM | POA: Diagnosis present

## 2022-06-01 DIAGNOSIS — N179 Acute kidney failure, unspecified: Secondary | ICD-10-CM | POA: Diagnosis present

## 2022-06-01 DIAGNOSIS — R29702 NIHSS score 2: Secondary | ICD-10-CM | POA: Diagnosis present

## 2022-06-01 DIAGNOSIS — Z7902 Long term (current) use of antithrombotics/antiplatelets: Secondary | ICD-10-CM | POA: Diagnosis not present

## 2022-06-01 DIAGNOSIS — F10139 Alcohol abuse with withdrawal, unspecified: Secondary | ICD-10-CM | POA: Diagnosis present

## 2022-06-01 DIAGNOSIS — I63033 Cerebral infarction due to thrombosis of bilateral carotid arteries: Secondary | ICD-10-CM | POA: Diagnosis not present

## 2022-06-01 DIAGNOSIS — I6340 Cerebral infarction due to embolism of unspecified cerebral artery: Secondary | ICD-10-CM | POA: Diagnosis present

## 2022-06-01 DIAGNOSIS — I69398 Other sequelae of cerebral infarction: Secondary | ICD-10-CM | POA: Diagnosis not present

## 2022-06-01 DIAGNOSIS — K2100 Gastro-esophageal reflux disease with esophagitis, without bleeding: Secondary | ICD-10-CM | POA: Diagnosis not present

## 2022-06-01 HISTORY — DX: Idiopathic aseptic necrosis of unspecified femur: M87.059

## 2022-06-01 LAB — PROTIME-INR
INR: 0.9 (ref 0.8–1.2)
Prothrombin Time: 12.3 seconds (ref 11.4–15.2)

## 2022-06-01 LAB — TROPONIN I (HIGH SENSITIVITY)
Troponin I (High Sensitivity): 23 ng/L — ABNORMAL HIGH (ref <18)
Troponin I (High Sensitivity): 24 ng/L — ABNORMAL HIGH (ref <18)

## 2022-06-01 LAB — DIFFERENTIAL
Abs Immature Granulocytes: 0.01 10*3/uL (ref 0.00–0.07)
Basophils Absolute: 0 10*3/uL (ref 0.0–0.1)
Basophils Relative: 0 %
Eosinophils Absolute: 0.1 10*3/uL (ref 0.0–0.5)
Eosinophils Relative: 1 %
Immature Granulocytes: 0 %
Lymphocytes Relative: 57 %
Lymphs Abs: 3.5 10*3/uL (ref 0.7–4.0)
Monocytes Absolute: 0.6 10*3/uL (ref 0.1–1.0)
Monocytes Relative: 10 %
Neutro Abs: 1.9 10*3/uL (ref 1.7–7.7)
Neutrophils Relative %: 32 %

## 2022-06-01 LAB — COMPREHENSIVE METABOLIC PANEL
ALT: 24 U/L (ref 0–44)
AST: 24 U/L (ref 15–41)
Albumin: 3.6 g/dL (ref 3.5–5.0)
Alkaline Phosphatase: 103 U/L (ref 38–126)
Anion gap: 5 (ref 5–15)
BUN: 13 mg/dL (ref 8–23)
CO2: 24 mmol/L (ref 22–32)
Calcium: 8.9 mg/dL (ref 8.9–10.3)
Chloride: 114 mmol/L — ABNORMAL HIGH (ref 98–111)
Creatinine, Ser: 0.9 mg/dL (ref 0.61–1.24)
GFR, Estimated: >60 mL/min (ref >60)
Glucose, Bld: 119 mg/dL — ABNORMAL HIGH (ref 70–99)
Potassium: 3.3 mmol/L — ABNORMAL LOW (ref 3.5–5.1)
Sodium: 143 mmol/L (ref 135–145)
Total Bilirubin: 0.3 mg/dL (ref 0.3–1.2)
Total Protein: 6.9 g/dL (ref 6.5–8.1)

## 2022-06-01 LAB — CBC
HCT: 41.9 % (ref 39.0–52.0)
Hemoglobin: 13 g/dL (ref 13.0–17.0)
MCH: 29.1 pg (ref 26.0–34.0)
MCHC: 31 g/dL (ref 30.0–36.0)
MCV: 93.9 fL (ref 80.0–100.0)
Platelets: 242 10*3/uL (ref 150–400)
RBC: 4.46 MIL/uL (ref 4.22–5.81)
RDW: 14.6 % (ref 11.5–15.5)
WBC: 6.1 10*3/uL (ref 4.0–10.5)
nRBC: 0 % (ref 0.0–0.2)

## 2022-06-01 LAB — CBG MONITORING, ED: Glucose-Capillary: 128 mg/dL — ABNORMAL HIGH (ref 70–99)

## 2022-06-01 LAB — ETHANOL: Alcohol, Ethyl (B): <10 mg/dL (ref <10)

## 2022-06-01 LAB — APTT: aPTT: 27 seconds (ref 24–36)

## 2022-06-01 MED ORDER — SODIUM CHLORIDE 0.9 % IV BOLUS
1000.0000 mL | Freq: Once | INTRAVENOUS | Status: AC
  Administered 2022-06-01: 1000 mL via INTRAVENOUS

## 2022-06-01 MED ORDER — SENNOSIDES-DOCUSATE SODIUM 8.6-50 MG PO TABS: 1.0000 | ORAL_TABLET | Freq: Every evening | ORAL | Status: DC | PRN

## 2022-06-01 MED ORDER — CLOPIDOGREL BISULFATE 75 MG PO TABS
75.0000 mg | ORAL_TABLET | Freq: Every day | ORAL | Status: DC
  Administered 2022-06-01 – 2022-06-05 (×5): 75 mg via ORAL
  Filled 2022-06-01 (×5): qty 1

## 2022-06-01 MED ORDER — LATANOPROST 0.005 % OP SOLN
1.0000 [drp] | Freq: Every day | OPHTHALMIC | Status: DC
  Administered 2022-06-01 – 2022-06-04 (×4): 1 [drp] via OPHTHALMIC
  Filled 2022-06-01 (×2): qty 2.5

## 2022-06-01 MED ORDER — ACETAMINOPHEN 160 MG/5ML PO SOLN: 650.0000 mg | ORAL | Status: DC | PRN

## 2022-06-01 MED ORDER — ATORVASTATIN CALCIUM 40 MG PO TABS
40.0000 mg | ORAL_TABLET | Freq: Every day | ORAL | Status: DC
  Administered 2022-06-01 – 2022-06-04 (×4): 40 mg via ORAL
  Filled 2022-06-01 (×4): qty 1

## 2022-06-01 MED ORDER — PANTOPRAZOLE SODIUM 40 MG PO TBEC
40.0000 mg | DELAYED_RELEASE_TABLET | Freq: Every day | ORAL | Status: DC
  Administered 2022-06-01 – 2022-06-05 (×5): 40 mg via ORAL
  Filled 2022-06-01 (×5): qty 1

## 2022-06-01 MED ORDER — ACETAMINOPHEN 325 MG PO TABS
650.0000 mg | ORAL_TABLET | ORAL | Status: DC | PRN
  Administered 2022-06-03: 650 mg via ORAL
  Filled 2022-06-01: qty 2

## 2022-06-01 MED ORDER — ALBUTEROL SULFATE (2.5 MG/3ML) 0.083% IN NEBU
2.5000 mg | INHALATION_SOLUTION | Freq: Three times a day (TID) | RESPIRATORY_TRACT | Status: DC
  Administered 2022-06-02 (×2): 2.5 mg via RESPIRATORY_TRACT
  Filled 2022-06-01 (×2): qty 3

## 2022-06-01 MED ORDER — NICOTINE 21 MG/24HR TD PT24
21.0000 mg | MEDICATED_PATCH | Freq: Every day | TRANSDERMAL | Status: DC
  Administered 2022-06-01 – 2022-06-05 (×5): 21 mg via TRANSDERMAL
  Filled 2022-06-01 (×5): qty 1

## 2022-06-01 MED ORDER — GABAPENTIN 300 MG PO CAPS
600.0000 mg | ORAL_CAPSULE | Freq: Three times a day (TID) | ORAL | Status: DC
  Administered 2022-06-01 – 2022-06-05 (×11): 600 mg via ORAL
  Filled 2022-06-01 (×12): qty 2

## 2022-06-01 MED ORDER — STROKE: EARLY STAGES OF RECOVERY BOOK
Freq: Once | Status: AC
  Filled 2022-06-01: qty 1

## 2022-06-01 MED ORDER — SODIUM CHLORIDE 0.9 % IV SOLN: INTRAVENOUS | Status: AC

## 2022-06-01 MED ORDER — ASPIRIN 81 MG PO TBEC
81.0000 mg | DELAYED_RELEASE_TABLET | Freq: Every day | ORAL | Status: DC
  Administered 2022-06-02 – 2022-06-05 (×4): 81 mg via ORAL
  Filled 2022-06-01 (×4): qty 1

## 2022-06-01 MED ORDER — GADOBUTROL 1 MMOL/ML IV SOLN
7.0000 mL | Freq: Once | INTRAVENOUS | Status: AC | PRN
  Administered 2022-06-01: 7 mL via INTRAVENOUS

## 2022-06-01 MED ORDER — HYDROCODONE-ACETAMINOPHEN 5-325 MG PO TABS
1.0000 | ORAL_TABLET | Freq: Four times a day (QID) | ORAL | Status: DC | PRN
  Administered 2022-06-02: 1 via ORAL
  Filled 2022-06-01: qty 1

## 2022-06-01 MED ORDER — ALBUTEROL SULFATE (2.5 MG/3ML) 0.083% IN NEBU
2.5000 mg | INHALATION_SOLUTION | RESPIRATORY_TRACT | Status: DC
  Administered 2022-06-01: 2.5 mg via RESPIRATORY_TRACT
  Filled 2022-06-01 (×2): qty 3

## 2022-06-01 MED ORDER — ONDANSETRON HCL 4 MG PO TABS: 4.0000 mg | ORAL_TABLET | Freq: Four times a day (QID) | ORAL | Status: DC | PRN

## 2022-06-01 MED ORDER — ACETAMINOPHEN 650 MG RE SUPP: 650.0000 mg | RECTAL | Status: DC | PRN

## 2022-06-01 MED ORDER — ONDANSETRON HCL 4 MG/2ML IJ SOLN: 4.0000 mg | Freq: Four times a day (QID) | INTRAMUSCULAR | Status: DC | PRN

## 2022-06-01 MED ORDER — IPRATROPIUM-ALBUTEROL 20-100 MCG/ACT IN AERS: 1.0000 | INHALATION_SPRAY | Freq: Four times a day (QID) | RESPIRATORY_TRACT | Status: DC | PRN

## 2022-06-01 MED ORDER — HEPARIN SODIUM (PORCINE) 5000 UNIT/ML IJ SOLN
5000.0000 [IU] | Freq: Three times a day (TID) | INTRAMUSCULAR | Status: DC
  Administered 2022-06-01 – 2022-06-05 (×11): 5000 [IU] via SUBCUTANEOUS
  Filled 2022-06-01 (×11): qty 1

## 2022-06-01 MED ORDER — IOHEXOL 350 MG/ML SOLN
50.0000 mL | Freq: Once | INTRAVENOUS | Status: AC | PRN
  Administered 2022-06-01: 50 mL via INTRAVENOUS

## 2022-06-01 MED ORDER — IPRATROPIUM-ALBUTEROL 0.5-2.5 (3) MG/3ML IN SOLN: 3.0000 mL | Freq: Four times a day (QID) | RESPIRATORY_TRACT | Status: DC | PRN

## 2022-06-01 MED ORDER — MELATONIN 3 MG PO TABS
3.0000 mg | ORAL_TABLET | Freq: Every day | ORAL | Status: DC
  Administered 2022-06-01 – 2022-06-04 (×4): 3 mg via ORAL
  Filled 2022-06-01 (×4): qty 1

## 2022-06-01 NOTE — ED Notes (Signed)
Patient transported to CT 

## 2022-06-01 NOTE — ED Triage Notes (Signed)
Reported pt here for dizziness that started 3 days ago and stuttering as well. Very hard to understand pt with speech.  Denies any pain. Pt c/o weakness in legs. Pt alert and not able to states correct month or year but able to states correct age.

## 2022-06-01 NOTE — ED Notes (Signed)
Provider seeing pt in [facility] triage

## 2022-06-01 NOTE — ED Notes (Addendum)
Patient transported to CT 

## 2022-06-01 NOTE — ED Provider Triage Note (Signed)
Emergency Medicine Provider Triage Evaluation Note  Timothy Galvan , a 66 y.o. male  was evaluated in triage.  Pt complains of dizziness.  States this started 3 days ago.  Worsening standing.  Also complaining of difficulty with speech.  States this started at the same time.  Has been stuttering.  This has never happened to him before.  Also complaining of weakness of both legs.  This started around the same time.  Denies chest pain or shortness of breath.  Very difficult to understand patient  Review of Systems  Positive: As above Negative: As above  Physical Exam  BP (!) 145/77 (BP Location: Right Arm)   Pulse 66   Temp 97.6 F (36.4 C) (Oral)   Resp 16   Ht '5\' 3"'$  (1.6 m)   Wt 63.5 kg   SpO2 99% Comment: RA  BMI 24.80 kg/m  Gen:   Awake, no distress   Resp:  Normal effort  MSK:   Moves extremities without difficulty Other:  No unilateral or global weakness.  There is some ataxia on finger-to-nose.  Sensation intact.  Medical Decision Making  Medically screening exam initiated at 1:30 PM.  Appropriate orders placed.  Timothy Galvan was informed that the remainder of the evaluation will be completed by another provider, this initial triage assessment does not replace that evaluation, and the importance of remaining in the ED until their evaluation is complete.  AMS work-up   Timothy Galvan, Timothy Galvan 06/01/22 1333

## 2022-06-01 NOTE — ED Notes (Signed)
Pt attempted to use urinal at bedside. Pt urinated on themself. Bed linens changed as well as pt's gown.

## 2022-06-01 NOTE — ED Provider Notes (Signed)
[facility] EMERGENCY DEPARTMENT Provider Note   CSN: [id number] Arrival date & time: 06/01/22  1229     History  Chief Complaint  Patient presents with   Dizziness    Timothy Galvan is a 66 y.o. male.  With a history of CAD, cancer, asthma, hypertension, GERD who presents ED for evaluation of 3 days of dizziness, lightheadedness and stuttering.  States symptoms started abruptly.  Lightheadedness is constant.  Dizziness is mostly present with movement.  Has been unable to walk for the past 2 days.  Denies illicit drug or alcohol use.  Denies chest pain, shortness of breath, numbness or tingling.  Difficult to obtain history from patient due to speech difficulty  Attempted to call all of patient's contacts.  His first to contact did not answer.  History of contact states that she did not know what happened.  She referred me to patient's Sister Hoyle Sauer.  She states that patient's son has seen him over the past 3 days and found him to be tired and sleeping on the bed each time.  He grew increasingly concerned today and called EMS due to the speech difficulty and weakness.   Dizziness      Home Medications Prior to Admission medications   Medication Sig Start Date End Date Taking? Authorizing Provider  acetaminophen (TYLENOL) 325 MG tablet Take 2 tablets (650 mg total) by mouth every 6 (six) hours as needed for mild pain (or Fever >/= 101). Patient not taking: Reported on 02/04/2019 01/13/18   Loletha Grayer, MD  alendronate (FOSAMAX) 70 MG tablet Take 1 tablet (70 mg total) by mouth once a week. Take with a full glass of water on an empty stomach. Patient not taking: Reported on 01/16/2019 01/13/18   Loletha Grayer, MD  aspirin EC 81 MG tablet Take 81 mg by mouth at bedtime.    [provider]  atorvastatin (LIPITOR) 40 MG tablet Take 40 mg by mouth at bedtime.     [provider]  clindamycin (CLEOCIN) 150 MG capsule Take 2 capsules (300 mg total) by mouth 3 (three)  times daily. 08/02/21   Fisher, Linden Dolin, PA-C  gabapentin (NEURONTIN) 300 MG capsule Take 600 mg by mouth 3 (three) times daily. 11/21/18   [provider]  HYDROcodone-acetaminophen (NORCO/VICODIN) 5-325 MG tablet Take 1 tablet by mouth every 6 (six) hours as needed for moderate pain. 01/18/19   Vaughan Basta, MD  ibuprofen (ADVIL) 600 MG tablet Take 600 mg by mouth every 4 (four) hours as needed. 02/02/19   [provider]  Ipratropium-Albuterol (COMBIVENT) 20-100 MCG/ACT AERS respimat Inhale 1 puff into the lungs every 6 (six) hours as needed for wheezing.    [provider]  isosorbide mononitrate (IMDUR) 60 MG 24 hr tablet Take 1 tablet (60 mg total) by mouth at bedtime. 02/04/19   [name], MD  latanoprost (XALATAN) 0.005 % ophthalmic solution Place 1 drop into the right eye at bedtime. 01/09/19   [provider]  Melatonin 5 MG TABS Take 5 mg by mouth at bedtime.    [provider]  meloxicam (MOBIC) 15 MG tablet Take 1 tablet (15 mg total) by mouth daily. Patient not taking: Reported on 01/16/2019 01/13/18   Loletha Grayer, MD  metoprolol tartrate (LOPRESSOR) 25 MG tablet Take 1 tablet (25 mg total) by mouth 2 (two) times daily. 01/18/19   Vaughan Basta, MD  nitroGLYCERIN (NITROSTAT) 0.4 MG SL tablet Place 1 tablet (0.4 mg total) under the tongue  every 5 (five) minutes as needed for chest pain. 02/04/19   [name], MD  omeprazole (PRILOSEC) 20 MG capsule Take 20 mg by mouth at bedtime.     [provider]  Saccharomyces boulardii (PROBIOTIC) 250 MG CAPS Take 1 capsule by mouth 3 (three) times daily. 01/18/19   Vaughan Basta, MD  traMADol (ULTRAM) 50 MG tablet Take 1 tablet (50 mg total) by mouth every 6 (six) hours as needed. 08/02/21   Versie Starks, PA-C      Allergies    Penicillins    Review of Systems   Review of Systems  Neurological:  Positive for dizziness and light-headedness.  All other  systems reviewed and are negative.   Physical Exam Updated Vital Signs BP (!) 174/85 (BP Location: Left Arm)   Pulse (!) 55   Temp 97.6 F (36.4 C) (Oral)   Resp 16   Ht '5\' 3"'$  (1.6 m)   Wt 63.5 kg   SpO2 96%   BMI 24.80 kg/m  Physical Exam Vitals and nursing note reviewed.  Constitutional:      General: He is not in acute distress.    Appearance: He is well-developed. He is not ill-appearing.  HENT:     Head: Normocephalic and atraumatic.  Eyes:     Extraocular Movements: Extraocular movements intact.     Conjunctiva/sclera: Conjunctivae normal.     Pupils: Pupils are equal, round, and reactive to light.  Cardiovascular:     Rate and Rhythm: Normal rate and regular rhythm.     Pulses: Normal pulses.     Heart sounds: No murmur heard. Pulmonary:     Effort: Pulmonary effort is normal. No respiratory distress.     Breath sounds: Normal breath sounds.  Abdominal:     Palpations: Abdomen is soft.     Tenderness: There is no abdominal tenderness.  Musculoskeletal:        General: No swelling.     Cervical back: Normal range of motion and neck supple.  Skin:    General: Skin is warm and dry.     Capillary Refill: Capillary refill takes less than 2 seconds.  Neurological:     Mental Status: He is alert.  Psychiatric:        Mood and Affect: Mood normal.     ED Results / Procedures / Treatments   Labs (all labs ordered are listed, but only abnormal results are displayed) Labs Reviewed  COMPREHENSIVE METABOLIC PANEL - Abnormal; Notable for the following components:      Result Value   Potassium 3.3 (*)    Chloride 114 (*)    Glucose, Bld 119 (*)    All other components within normal limits  CBG MONITORING, ED - Abnormal; Notable for the following components:   Glucose-Capillary 128 (*)    All other components within normal limits  TROPONIN I (HIGH SENSITIVITY) - Abnormal; Notable for the following components:   Troponin I (High Sensitivity) 23 (*)    All other  components within normal limits  TROPONIN I (HIGH SENSITIVITY) - Abnormal; Notable for the following components:   Troponin I (High Sensitivity) 24 (*)    All other components within normal limits  ETHANOL  PROTIME-INR  APTT  CBC  DIFFERENTIAL  RAPID URINE DRUG SCREEN, HOSP PERFORMED  URINALYSIS, ROUTINE W REFLEX MICROSCOPIC    EKG EKG Interpretation  Date/Time:  Monday June 01 2022 12:56:33 EST Ventricular Rate:  53 PR Interval:  186 QRS Duration: 84 QT  Interval:  496 QTC Calculation: 465 R Axis:   69 Text Interpretation: Sinus bradycardia with sinus arrhythmia When compared with ECG of 07-Mar-2022 17:34, PREVIOUS ECG IS PRESENT No significant changes Confirmed by Octaviano Glow [phone]) on 06/01/2022 4:03:46 PM  Radiology MR Brain W and Wo Contrast  Result Date: 06/01/2022 CLINICAL DATA:  Stroke follow-up. Altered mental status, abnormal head CT. EXAM: MRI HEAD WITHOUT AND WITH CONTRAST MRA HEAD WITHOUT CONTRAST TECHNIQUE: Multiplanar, multi-echo pulse sequences of the brain and surrounding structures were acquired without and with intravenous contrast. Angiographic images of the Circle of Willis were acquired using MRA technique without intravenous contrast. CONTRAST:  52m GADAVIST GADOBUTROL 1 MMOL/ML IV SOLN COMPARISON:  Same-day head CT, brain MRI 03/08/2022, CTA head/neck 03/08/2019 FINDINGS: MRI HEAD FINDINGS Brain: There is diffusion restriction with associated T2/FLAIR signal abnormality in the left lentiform nucleus extending to the caudate body as well as in the right aspect of the pons consistent with acute to subacute infarcts. There is no associated hemorrhage or mass effect. There is no other evidence of acute infarct There is no acute intracranial hemorrhage or extra-axial fluid collection. Parenchymal volume is stable. The ventricles are stable in size compared to the prior brain MRI. 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 There is no mass lesion. There is no abnormal enhancement. There is no mass effect or midline shift. Vascular: The right ICA flow void remains abnormal, unchanged. The vasculature is assessed in full below. Skull and upper cervical spine: Normal marrow signal. Sinuses/Orbits: The paranasal sinuses are clear. The globes and orbits are unremarkable. Other: None. MRA HEAD FINDINGS Anterior circulation: There is unchanged occlusion of the right ICA throughout its imaged course with reconstitution of flow in the communicating segment. There is atherosclerotic irregularity of the left intracranial ICA with up to severe narrowing, similar to the prior study. There is focal moderate to severe stenosis of the distal right M1 segment (1009-8). The MCAs are otherwise patent, without other proximal high-grade stenosis or occlusion. The bilateral ACAs are patent, with overall mild multifocal atherosclerotic irregularity and narrowing but no high-grade stenosis or occlusion. There is no aneurysm or AVM. Posterior circulation: The bilateral V4 segments are patent to the level imaged. The imaged cerebellar arteries appear patent. The bilateral PCAs are patent proximally. There is short-segment severe stenosis of the left P2/P3 segment (1027-14). Bilateral posterior communicating arteries are identified. There is no aneurysm or AVM. Anatomic variants: None. IMPRESSION: 1. 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 2. 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 3. 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 4. 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 5.  Focal moderate to severe stenosis of the distal right M1 segment and short-segment severe stenosis of the left P2/P3 segment. Electronically Signed   By: PValetta MoleM.D.   On: 06/01/2022 16:10   MR ANGIO HEAD WO CONTRAST  Result Date: 06/01/2022 CLINICAL DATA:  Stroke follow-up. Altered mental status, abnormal head CT. EXAM: MRI HEAD WITHOUT AND WITH CONTRAST MRA HEAD WITHOUT CONTRAST TECHNIQUE: Multiplanar, multi-echo pulse sequences of the brain and surrounding structures were acquired without and with intravenous contrast. Angiographic images of the  Circle of Willis were acquired using MRA technique without intravenous contrast. CONTRAST:  35m GADAVIST GADOBUTROL 1 MMOL/ML IV SOLN COMPARISON:  Same-day head CT, brain MRI 03/08/2022, CTA head/neck 03/08/2019 FINDINGS: MRI HEAD FINDINGS Brain: There is diffusion restriction with associated T2/FLAIR signal abnormality in the left lentiform nucleus extending to the caudate body as well as in the right aspect of the pons consistent with acute to subacute infarcts. There is no associated hemorrhage or mass effect. There is no other evidence of acute infarct There is no acute intracranial hemorrhage or extra-axial fluid collection. Parenchymal volume is stable. The ventricles are stable in size compared to the prior brain MRI. 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 There is no mass lesion. There is no abnormal enhancement. There is no mass effect or midline shift. Vascular: The right ICA flow void remains abnormal, unchanged. The vasculature is assessed in full below. Skull and upper cervical spine: Normal marrow signal. Sinuses/Orbits: The paranasal sinuses are clear. The globes and orbits are unremarkable. Other: None. MRA HEAD FINDINGS Anterior circulation: There is  unchanged occlusion of the right ICA throughout its imaged course with reconstitution of flow in the communicating segment. There is atherosclerotic irregularity of the left intracranial ICA with up to severe narrowing, similar to the prior study. There is focal moderate to severe stenosis of the distal right M1 segment (1009-8). The MCAs are otherwise patent, without other proximal high-grade stenosis or occlusion. The bilateral ACAs are patent, with overall mild multifocal atherosclerotic irregularity and narrowing but no high-grade stenosis or occlusion. There is no aneurysm or AVM. Posterior circulation: The bilateral V4 segments are patent to the level imaged. The imaged cerebellar arteries appear patent. The bilateral PCAs are patent proximally. There is short-segment severe stenosis of the left P2/P3 segment (1027-14). Bilateral posterior communicating arteries are identified. There is no aneurysm or AVM. Anatomic variants: None. IMPRESSION: 1. 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 2. 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 3. 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 4. 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 5. Focal moderate to severe stenosis of the distal right M1 segment and short-segment severe stenosis of the left P2/P3 segment. Electronically Signed   By: PValetta MoleM.D.   On: 06/01/2022 16:10   CT Head Wo Contrast  Result Date: 06/01/2022 CLINICAL DATA:  Altered mental status, dizziness EXAM: CT HEAD WITHOUT CONTRAST TECHNIQUE: Contiguous axial images were obtained from the base of the skull through the vertex without intravenous contrast. RADIATION DOSE REDUCTION: This exam was performed according to the departmental dose-optimization  program which includes automated exposure control, adjustment of the mA and/or kV according to patient size and/or use of iterative reconstruction technique. COMPARISON:  03/07/2022 FINDINGS: Brain: There are no signs of bleeding within the cranium. There is 1.5 cm area of new low-attenuation in the left basal ganglia. Cortical sulci are prominent. Vascular: Unremarkable. Skull: Unremarkable. Sinuses/Orbits: Unremarkable. Other: None. IMPRESSION: There are no signs of bleeding within the cranium. There is new 1.5 cm low-density in left basal ganglia suggesting acute/subacute or old infarct. If clinically warranted, follow-up MRI may be considered. Electronically Signed   By: PRoyston Cowper  Rathinasamy M.D.   On: 06/01/2022 14:37   DG Chest 1 View  Result Date: 06/01/2022 CLINICAL DATA:  Dizziness for 3 days with shortness of breath. EXAM: CHEST  1 VIEW COMPARISON:  Radiographs 02/04/2019 and 01/16/2019.  CT 01/10/2018. FINDINGS: 1345 hours. The heart size and mediastinal contours are stable. Asymmetric bullous emphysematous changes at the right lung apex are again noted. There is no edema, confluent airspace opacity, pleural effusion or pneumothorax. No acute osseous findings are evident. There is an old fracture of the mid right clavicle. IMPRESSION: No evidence of acute cardiopulmonary process. Chronic obstructive pulmonary disease. Electronically Signed   By: Richardean Sale M.D.   On: 06/01/2022 13:52    Procedures Procedures    Medications Ordered in ED Medications  aspirin EC tablet 81 mg (has no administration in time range)  clopidogrel (PLAVIX) tablet 75 mg (75 mg Oral Given 06/01/22 1828)  sodium chloride 0.9 % bolus 1,000 mL (0 mLs Intravenous Stopped 06/01/22 1827)  gadobutrol (GADAVIST) 1 MMOL/ML injection 7 mL (7 mLs Intravenous Contrast Given 06/01/22 1529)    ED Course/ Medical Decision Making/ A&P Clinical Course as of 06/01/22 1847  Mon Jun 01, 2022  1603 66 yo male here with 3 days  of aphasia, imbalance, found to have +CVA on CT imaging, outside window for tNK - pending MR imaging, Neuro consult and admission. [MT]  7106 MRI showing left basal ganglia and right pons infarct [MT]  1823 Spoke with neurology Dr. Quinn Axe.  She recommends admission to the hospital with formal neurology consult tomorrow. [AS]  [phone] Spoke with hospitalist Dr. Marily Memos who will admit patient [AS]    Clinical Course User Index [AS] Madylyn Insco, Grafton Folk, PA-C [MT] Langston Masker Carola Rhine, MD                           Medical Decision Making Amount and/or Complexity of Data Reviewed Labs: ordered. Radiology: ordered.  This patient presents to the ED for concern of altered mental status, dysphasia, weakness, this involves an extensive number of treatment options, and is a complaint that carries with it a high risk of complications and morbidity.  The differential diagnosis for AMS is extensive and includes, but is not limited to: drug overdose - opioids, alcohol, sedatives, antipsychotics, drug withdrawal, others; Metabolic: hypoxia, hypoglycemia, hyperglycemia, hypercalcemia, hypernatremia, hyponatremia, uremia, hepatic encephalopathy, hypothyroidism, hyperthyroidism, vitamin B12 or thiamine deficiency, carbon monoxide poisoning, Wilson's disease, Lactic acidosis, DKA/HHOS; Infectious: meningitis, encephalitis, bacteremia/sepsis, urinary tract infection, pneumonia, neurosyphilis; Structural: Space-occupying lesion, (brain tumor, subdural hematoma, hydrocephalus,); Vascular: stroke, subarachnoid hemorrhage, coronary ischemia, hypertensive encephalopathy, CNS vasculitis, thrombotic thrombocytopenic purpura, disseminated intravascular coagulation, hyperviscosity; Psychiatric: Schizophrenia, depression; Other: Seizure, hypothermia, heat stroke, ICU psychosis, dementia -"sundowning."    Co morbidities that complicate the patient evaluation  CAD, cancer, asthma, hypertension, GERD, previous CVA  My initial workup  includes altered mental status work-up  Additional history obtained from: Nursing notes from this visit. Family Limited history is provided as stated above  I ordered, reviewed and interpreted labs which include: CMP, CBC, APTT, INR, troponin, ethanol.  Troponin slightly elevated at 23 and then 24.  CMP significant for slight hypokalemia of 3.3 and hyperglycemia of 119.  Labs otherwise unremarkable   I ordered imaging studies including chest x-ray, CT head, MRI of brain and MR angio head I independently visualized and interpreted imaging which showed chest x-ray normal.  CT head showed possible subacute versus acute infarct.  MRI shows left basal ganglia  and right pons infarct I agree with the radiologist interpretation  Cardiac Monitoring:  The patient was maintained on a cardiac monitor.  I personally viewed and interpreted the cardiac monitored which showed an underlying rhythm of: NSR  Consultations Obtained:  I requested consultation with the neurology Dr. Quinn Axe,  and discussed lab and imaging findings as well as pertinent plan - they recommend: Aspirin, Plavix, admit to hospitalist and recommend formal consult tomorrow  Afebrile, hemodynamically stable.  66 year old male presenting to the ED for evaluation of aphasia, weakness, and dizziness with an onset of 3 days ago.  MRI does show infarct of left basal ganglia and right pons.  Neurology was consulted and recommend admission with formal consult tomorrow.  Patient was admitted to hospitalist Dr. Marily Memos.  Stable at the time of disposition.  Patient's case discussed with Dr. Langston Masker who agrees with plan to discharge with follow-up.   Note: Portions of this report may have been transcribed using voice recognition software. Every effort was made to ensure accuracy; however, inadvertent computerized transcription errors may still be present.          Final Clinical Impression(s) / ED Diagnoses Final diagnoses:  Cerebrovascular  accident (CVA), unspecified mechanism [facility])    Rx / DC Orders ED Discharge Orders     None         [name], [name] 06/01/22 1847    Wyvonnia Dusky, MD 06/01/22 [phone]

## 2022-06-01 NOTE — H&P (Signed)
History and Physical    Patient: Timothy Galvan KTG:[id number] DOB: Jan 15, 1956 DOA: 06/01/2022 DOS: the patient was seen and examined on 06/01/2022 PCP: [facility] [employer]  Patient coming from: Home  Chief Complaint:  Chief Complaint  Patient presents with   Dizziness   HPI: Timothy Galvan is a 66 y.o. male with medical history significant of Asthma, CAD, GERD, HTN, Colon cancer, PAD, and Cocaine use.   Pt presenting after 3 days of dizziness, and stuttering. Acute onset. Feeling off balance w/ movement. Difficulty ambulating for past 2 days. Denies CP, SOB, DOE. Endorsing Wheezing offa and on lately. Pts son called EMS after pts stutterintg and dizziness did not resolve. Associated w/ increased fatigue over the last few days.   Level V caveat appies as pt unable to provide much history due to speech change from stroke. History mostly provided by family.      Review of Systems: unable to review all systems due to the inability of the patient to answer questions. Past Medical History:  Diagnosis Date   Asthma    Blood clot in vein    Cancer ([place])    Coronary artery disease    GERD (gastroesophageal reflux disease)    Heart disease    Hypertension    Pneumonia    Past Surgical History:  Procedure Laterality Date   MANDIBLE SURGERY     WRIST SURGERY     Social History:  reports that he has been smoking cigarettes. He has been smoking an average of 1 pack per day. He has never used smokeless tobacco. He reports current alcohol use of about 12.0 standard drinks of alcohol per week. He reports that he does not currently use drugs after having used the following drugs: Cocaine.  Allergies  Allergen Reactions   Penicillins     Has patient had a PCN reaction causing immediate rash, facial/tongue/throat swelling, SOB or lightheadedness with hypotension: Unknown Has patient had a PCN reaction causing severe rash involving mucus membranes or skin necrosis: Unknown Has  patient had a PCN reaction that required hospitalization: Unknown Has patient had a PCN reaction occurring within the last 10 years: Unknown If all of the above answers are "NO", then may proceed with Cephalosporin use.     History reviewed. No pertinent family history.  Prior to Admission medications   Medication Sig Start Date End Date Taking? Authorizing Provider  acetaminophen (TYLENOL) 325 MG tablet Take 2 tablets (650 mg total) by mouth every 6 (six) hours as needed for mild pain (or Fever >/= 101). Patient not taking: Reported on 02/04/2019 01/13/18   Loletha Grayer, MD  alendronate (FOSAMAX) 70 MG tablet Take 1 tablet (70 mg total) by mouth once a week. Take with a full glass of water on an empty stomach. Patient not taking: Reported on 01/16/2019 01/13/18   Loletha Grayer, MD  aspirin EC 81 MG tablet Take 81 mg by mouth at bedtime.    [provider]  atorvastatin (LIPITOR) 40 MG tablet Take 40 mg by mouth at bedtime.     [provider]  clindamycin (CLEOCIN) 150 MG capsule Take 2 capsules (300 mg total) by mouth 3 (three) times daily. 08/02/21   Fisher, Linden Dolin, PA-C  gabapentin (NEURONTIN) 300 MG capsule Take 600 mg by mouth 3 (three) times daily. 11/21/18   [provider]  HYDROcodone-acetaminophen (NORCO/VICODIN) 5-325 MG tablet Take 1 tablet by mouth every 6 (six) hours as needed for moderate pain. 01/18/19   Anselm Jungling,  Rosalio Macadamia, MD  ibuprofen (ADVIL) 600 MG tablet Take 600 mg by mouth every 4 (four) hours as needed. 02/02/19   [provider]  Ipratropium-Albuterol (COMBIVENT) 20-100 MCG/ACT AERS respimat Inhale 1 puff into the lungs every 6 (six) hours as needed for wheezing.    [provider]  isosorbide mononitrate (IMDUR) 60 MG 24 hr tablet Take 1 tablet (60 mg total) by mouth at bedtime. 02/04/19   [name], MD  latanoprost (XALATAN) 0.005 % ophthalmic solution Place 1 drop into the right eye at bedtime. 01/09/19   [provider]  Melatonin 5 MG TABS Take 5 mg by mouth at bedtime.    [provider]  meloxicam (MOBIC) 15 MG tablet Take 1 tablet (15 mg total) by mouth daily. Patient not taking: Reported on 01/16/2019 01/13/18   Loletha Grayer, MD  metoprolol tartrate (LOPRESSOR) 25 MG tablet Take 1 tablet (25 mg total) by mouth 2 (two) times daily. 01/18/19   Vaughan Basta, MD  nitroGLYCERIN (NITROSTAT) 0.4 MG SL tablet Place 1 tablet (0.4 mg total) under the tongue every 5 (five) minutes as needed for chest pain. 02/04/19   [name], MD  omeprazole (PRILOSEC) 20 MG capsule Take 20 mg by mouth at bedtime.     [provider]  Saccharomyces boulardii (PROBIOTIC) 250 MG CAPS Take 1 capsule by mouth 3 (three) times daily. 01/18/19   Vaughan Basta, MD  traMADol (ULTRAM) 50 MG tablet Take 1 tablet (50 mg total) by mouth every 6 (six) hours as needed. 08/02/21   Versie Starks, PA-C    Physical Exam: Vitals:   06/01/22 1430 06/01/22 1438 06/01/22 1745 06/01/22 1800  BP: (!) 168/74   (!) 174/85  Pulse: (!) 43  (!) 55 (!) 55  Resp: 14   16  Temp:    97.6 F (36.4 C)  TempSrc:    Oral  SpO2: 100% 100% (!) 69% 96%  Weight:      Height:       General:  Appears calm and comfortable Eyes:  PERRL, EOMI, normal lids, iris ENT:  grossly normal hearing, lips & tongue, mmm Neck:  no LAD, masses or thyromegaly Cardiovascular:  RRR, no m/r/g. No LE edema.  Respiratory:  Wheezing throughout. Normal respiratory effort. Abdomen:  soft, ntnd, NABS Skin:  no rash or induration seen on limited exam Musculoskeletal:  grossly symmetrical though weak tone BUE/BLE, no bony abnormality Psychiatric:  grossly normal mood and affect, AOx3 Neurologic:  CN 2-12 grossly intact, Finger to nose abnormal and cumbersome w/ poor rapid alternating movement in hands. Sensation symmetrical and intact throuhogut. Able to sit unassisted. Did not attempt to have pt stand at this time.   Data  Reviewed:   ECG: Sinus, No ACS, brady  Assessment and Plan: Principal Problem:   Stroke (cerebrum) ([place]) Active Problems:   HLD (hyperlipidemia)   GERD (gastroesophageal reflux disease)   CAD (coronary artery disease)   Cancer ([facility])  Stroke: Noted on MRI/MRA - Acute to Subacute infarct in L basal ganglia and R pons w/o hemorrhage. Neuro consulted by EDP and appreciate their input.  - Inpt admission - Will need outpt neuro-IR eval.  - Permissive HTN x 48 hrs - ASA/Plavix - Neuro checks - CTA Neck - PT/OT/SLP - Place routine teleneuro consult to Dr Hortense Ramal in am per Neuro request.  - Echo (pt also w/ questionable h/o SCHF - last EF 55%).   COPD/Asthma: currently in mid exacerbation - Albuterol Neb Q4  x12 hrs then PRN - continue Combivent   HTN: Permissive HTN as above - 210/110 - After 48 hrs resume Lopressor, IMdur  Drug use: Denies on admission. Positive as recently as August.  - UDS  GERD: - conitnue PPI  MSK/Neuropathic pain: chronic - continu eVicodin  Insomnia: - contiue home Melatonin  HLD: - continue Lipitor    Advance Care Planning:   Code Status: Prior FULL  Consults: Neuro - reconsult in am  Family Communication: Brother and sister in law  Severity of Illness: The appropriate patient status for this patient is INPATIENT. Inpatient status is judged to be reasonable and necessary in order to provide the required intensity of service to ensure the patient's safety. The patient's presenting symptoms, physical exam findings, and initial radiographic and laboratory data in the context of their chronic comorbidities is felt to place them at high risk for further clinical deterioration. Furthermore, it is not anticipated that the patient will be medically stable for discharge from the hospital within 2 midnights of admission.   * I certify that at the point of admission it is my clinical judgment that the patient will require inpatient hospital care spanning  beyond 2 midnights from the point of admission due to high intensity of service, high risk for further deterioration and high frequency of surveillance required.*  Author: Waldemar Dickens, MD 06/01/2022 6:47 PM  For on call review [email or web address].

## 2022-06-01 NOTE — Plan of Care (Signed)
Neurology plan of care  Neurology was contacted by Georgiana Spinner ED PA at [facility] regarding this patient who presented with 3 days of R sided weakness and imbalance. MRI brain showed early subacute infarcts L basal ganglia and R pons. 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 - Focal moderate to severe stenosis of distal R M1 segment and short-segment severe stenosis of L P2/P3  Given chronic total occlusion R ICA he is not a candidate for revascularization on that side. Although his L ICA stenosis is unchanged from Aug, since the other side is chronically occluded he is at high risk for severe stroke if the L ICA occludes as well. D/w Dr. Ladean Raya of neuro-IR who recommended he be seen shortly after hospital discharge in IR clinic to discuss possible stenting of the L intracranial ICA. Close outpatient f/u for this is preferred to stenting electively during this hospitalization 2/2 his current acute infarcts. In the meantime patient should be on DAPT. If there is an exam change, STAT CT/CTA should be performed and telestroke called; if he has an acute occlusion in that situation emergent revascularization would be considered. Despite overall high risk of this lesion the risk of stroke from it in the next few days is not felt to be high enough to warrant transfer to [facility] for observation since the lesion is unchanged from 3 mos ago.  Recommendations:  - Admit to [facility] for stroke workup - Goal normotension >48 hrs from sx onset, strict avoidance of hypotension given extensive cerebrovascular disease - TTE - Check A1c and LDL + add statin per guidelines - ASA '81mg'$  daily + plavix '75mg'$  daily until outpatient f/u with neuro IR; defer further recommendations to them - q4 hr neuro checks - STAT head CT and CTA  H&N and telestroke activation for any change in neuro exam - Tele - PT/OT/SLP - Stroke education - I will arrange outpatient f/u with neuro-IR - Please place routine teleneuro consult to Dr. Hortense Ramal (under neurology in [place]) tmrw AM for full consultation.  Su Monks, MD [facility] [phone]  If 7pm- 7am, please page neurology on call as listed in [place].

## 2022-06-02 ENCOUNTER — Inpatient Hospital Stay (HOSPITAL_COMMUNITY): Payer: Medicare Other

## 2022-06-02 ENCOUNTER — Telehealth: Payer: Self-pay | Admitting: *Deleted

## 2022-06-02 DIAGNOSIS — J449 Chronic obstructive pulmonary disease, unspecified: Secondary | ICD-10-CM

## 2022-06-02 DIAGNOSIS — I6389 Other cerebral infarction: Secondary | ICD-10-CM

## 2022-06-02 DIAGNOSIS — K2100 Gastro-esophageal reflux disease with esophagitis, without bleeding: Secondary | ICD-10-CM

## 2022-06-02 DIAGNOSIS — I6300 Cerebral infarction due to thrombosis of unspecified precerebral artery: Secondary | ICD-10-CM

## 2022-06-02 DIAGNOSIS — I639 Cerebral infarction, unspecified: Secondary | ICD-10-CM

## 2022-06-02 LAB — ECHOCARDIOGRAM COMPLETE
AR max vel: 2.17 cm2
AV Area VTI: 2.23 cm2
AV Area mean vel: 2.21 cm2
AV Mean grad: 3 mmHg
AV Peak grad: 6.2 mmHg
Ao pk vel: 1.24 m/s
Area-P 1/2: 3.08 cm2
Calc EF: 35.6 %
Height: 63 in
MV VTI: 2.57 cm2
S' Lateral: 4 cm
Single Plane A2C EF: 34.8 %
Single Plane A4C EF: 34.9 %
Weight: 2240 oz

## 2022-06-02 LAB — RAPID URINE DRUG SCREEN, HOSP PERFORMED
Amphetamines: NOT DETECTED
Barbiturates: NOT DETECTED
Benzodiazepines: NOT DETECTED
Cocaine: POSITIVE — AB
Opiates: NOT DETECTED
Tetrahydrocannabinol: NOT DETECTED

## 2022-06-02 LAB — URINALYSIS, ROUTINE W REFLEX MICROSCOPIC
Bilirubin Urine: NEGATIVE
Glucose, UA: 50 mg/dL — AB
Hgb urine dipstick: NEGATIVE
Ketones, ur: NEGATIVE mg/dL
Leukocytes,Ua: NEGATIVE
Nitrite: NEGATIVE
Protein, ur: NEGATIVE mg/dL
Specific Gravity, Urine: 1.004 — ABNORMAL LOW (ref 1.005–1.030)
pH: 6 (ref 5.0–8.0)

## 2022-06-02 LAB — LIPID PANEL
Cholesterol: 134 mg/dL (ref 0–200)
HDL: 49 mg/dL (ref >40)
LDL Cholesterol: 52 mg/dL (ref 0–99)
Total CHOL/HDL Ratio: 2.7 RATIO
Triglycerides: 165 mg/dL — ABNORMAL HIGH (ref <150)
VLDL: 33 mg/dL (ref 0–40)

## 2022-06-02 MED ORDER — LORAZEPAM 2 MG/ML IJ SOLN
1.0000 mg | INTRAMUSCULAR | Status: DC | PRN
  Administered 2022-06-02: 2 mg via INTRAVENOUS
  Filled 2022-06-02: qty 1

## 2022-06-02 MED ORDER — ADULT MULTIVITAMIN W/MINERALS CH
1.0000 | ORAL_TABLET | Freq: Every day | ORAL | Status: DC
  Administered 2022-06-02 – 2022-06-05 (×4): 1 via ORAL
  Filled 2022-06-02 (×4): qty 1

## 2022-06-02 MED ORDER — THIAMINE MONONITRATE 100 MG PO TABS
100.0000 mg | ORAL_TABLET | Freq: Every day | ORAL | Status: DC
  Administered 2022-06-02 – 2022-06-04 (×3): 100 mg via ORAL
  Filled 2022-06-02 (×3): qty 1

## 2022-06-02 MED ORDER — ALBUTEROL SULFATE (2.5 MG/3ML) 0.083% IN NEBU: 2.5000 mg | INHALATION_SOLUTION | RESPIRATORY_TRACT | Status: DC | PRN

## 2022-06-02 MED ORDER — TIOTROPIUM BROMIDE MONOHYDRATE 18 MCG IN CAPS: 18.0000 ug | ORAL_CAPSULE | Freq: Every day | RESPIRATORY_TRACT | Status: DC

## 2022-06-02 MED ORDER — LORAZEPAM 1 MG PO TABS: 1.0000 mg | ORAL_TABLET | ORAL | Status: DC | PRN

## 2022-06-02 MED ORDER — THIAMINE HCL 100 MG/ML IJ SOLN
100.0000 mg | Freq: Every day | INTRAMUSCULAR | Status: DC
  Filled 2022-06-02: qty 2

## 2022-06-02 MED ORDER — FOLIC ACID 1 MG PO TABS
1.0000 mg | ORAL_TABLET | Freq: Every day | ORAL | Status: DC
  Administered 2022-06-02 – 2022-06-05 (×4): 1 mg via ORAL
  Filled 2022-06-02 (×4): qty 1

## 2022-06-02 MED ORDER — HYDRALAZINE HCL 20 MG/ML IJ SOLN
10.0000 mg | Freq: Four times a day (QID) | INTRAMUSCULAR | Status: DC | PRN
  Administered 2022-06-02: 10 mg via INTRAVENOUS
  Filled 2022-06-02: qty 1

## 2022-06-02 MED ORDER — DIAZEPAM 2 MG PO TABS
2.0000 mg | ORAL_TABLET | Freq: Three times a day (TID) | ORAL | Status: AC
  Administered 2022-06-02 – 2022-06-05 (×8): 2 mg via ORAL
  Filled 2022-06-02 (×8): qty 1

## 2022-06-02 MED ORDER — UMECLIDINIUM BROMIDE 62.5 MCG/ACT IN AEPB
1.0000 | INHALATION_SPRAY | Freq: Every day | RESPIRATORY_TRACT | Status: DC
  Administered 2022-06-03 – 2022-06-05 (×3): 1 via RESPIRATORY_TRACT
  Filled 2022-06-02: qty 7

## 2022-06-02 NOTE — Telephone Encounter (Signed)
Verbal order from Dr. Denton Brick, event monitor for 30 days for stoke. Order placed and pt enrolled in [employer].

## 2022-06-02 NOTE — Progress Notes (Signed)
   06/02/22 1839  Vitals  Temp 98.5 F (36.9 C)  BP (!) 192/90  MAP (mmHg) 119  BP Method Automatic  Patient Position (if appropriate) Lying  Pulse Rate (!) 56  Pulse Rate Source Dinamap  Resp (!) 21  MEWS COLOR  MEWS Score Color Green  Oxygen Therapy  SpO2 100 %  O2 Device Room Air  MEWS Score  MEWS Temp 0  MEWS Systolic 0  MEWS Pulse 0  MEWS RR 1  MEWS LOC 0  MEWS Score 1   Given PRN hydralazine IV , patients BP up from previous VS

## 2022-06-02 NOTE — Evaluation (Signed)
Occupational Therapy Evaluation Patient Details Name: Timothy Galvan MRN: [id number] DOB: 28-Aug-1955 Today's Date: 06/02/2022   History of Present Illness Timothy Galvan is a 66 y.o. male with medical history significant of Asthma, CAD, GERD, HTN, Colon cancer, and PAD. Pt presenting after 3 days acute onset of dizziness and stuttering. Feeling off balance w/ movement. Difficulty ambulating for past 2 days. Denies CP, SOB, DOE. Endorsing Wheezing offa and on lately. Pts son called EMS after pts stutterintg and dizziness did not resolve. Associated w/ increased fatigue over the last few days.   Clinical Impression   Pt agreeable to OT and PT co-evaluation. Pt somewhat unclear on living history but reportedly has a care attendant that is available sometimes. Pt is independent for ADL's at baseline but reports difficulty competing IADL's. Pt uses a RW at baseline. Today pt was initially lethargic but increased arousal once sitting up at EOB which required supervision assistance. Pt able to stand and ambulate with RW and Min A. Slow labored movement when ambulating with RW. Pt reported increase in dizziness during ambulation and desaturation to 86% SpO2 but returned to upper 90s. Pt was not on supplemental O2. Pt is generally weak with poor gross motor coordination and mixed visual presentation including poor lateral tracking and peripheral vision deficits. Pt was left in the chair with call bell within reach and chair alarm set. Pt will benefit from continued OT in the hospital and recommended venue below to increase strength, balance, and endurance for safe ADL's.         Recommendations for follow up therapy are one component of a multi-disciplinary discharge planning process, led by the attending physician.  Recommendations may be updated based on patient status, additional functional criteria and insurance authorization.   Follow Up Recommendations  Acute inpatient rehab (3hours/day)      Assistance Recommended at Discharge Frequent or constant Supervision/Assistance  Patient can return home with the following A little help with walking and/or transfers;A little help with bathing/dressing/bathroom;Assistance with cooking/housework;Assist for transportation;Help with stairs or ramp for entrance    Functional Status Assessment  Patient has had a recent decline in their functional status and demonstrates the ability to make significant improvements in function in a reasonable and predictable amount of time.  Equipment Recommendations  None recommended by OT    Recommendations for Other Services Other (comment) (Follow up evaluation from vision specialist.)     Precautions / Restrictions Precautions Precautions: Fall Restrictions Weight Bearing Restrictions: No      Mobility Bed Mobility Overal bed mobility: Needs Assistance Bed Mobility: Supine to Sit     Supine to sit: Supervision     General bed mobility comments: Labored movement    Transfers Overall transfer level: Needs assistance Equipment used: Rolling walker (2 wheels) Transfers: Sit to/from Stand, Bed to chair/wheelchair/BSC Sit to Stand: Min assist     Step pivot transfers: Min guard, Min assist     General transfer comment: Slow labored movement; reports of dizziness.      Balance Overall balance assessment: Needs assistance Sitting-balance support: No upper extremity supported, Feet supported Sitting balance-Leahy Scale: Fair Sitting balance - Comments: fair to good seated EOB   Standing balance support: Bilateral upper extremity supported, During functional activity, Reliant on assistive device for balance Standing balance-Leahy Scale: Fair Standing balance comment: poor to fair with RW  ADL either performed or assessed with clinical judgement   ADL Overall ADL's : Needs assistance/impaired Eating/Feeding: Supervision/ safety;Sitting;Modified  independent   Grooming: Supervision/safety;Set up;Sitting   Upper Body Bathing: Set up;Sitting   Lower Body Bathing: Min guard;Sitting/lateral leans   Upper Body Dressing : Set up;Sitting   Lower Body Dressing: Min guard;Sitting/lateral leans Lower Body Dressing Details (indicate cue type and reason): Pt able to doff and don R sock seated in chair with labored effort. Toilet Transfer: Minimal assistance;Rolling walker (2 wheels);Ambulation Toilet Transfer Details (indicate cue type and reason): Simulated via EOB to chair with RW and ambulation in hall and back. Toileting- Clothing Manipulation and Hygiene: Set up;Sitting/lateral lean   Tub/ Shower Transfer: Minimal assistance;Ambulation;Rolling walker (2 wheels)   Functional mobility during ADLs: Minimal assistance;Min guard;Rolling walker (2 wheels) General ADL Comments: Pt able to ambulate in hall >20 feet with RW; very slow labored movement.     Vision Baseline Vision/History: 1 Wears glasses Ability to See in Adequate Light: 1 Impaired Patient Visual Report: No change from baseline (Pt reports history of "seeing spots.") Vision Assessment?: Yes Tracking/Visual Pursuits: Other (comment) (Poor tracking in general. Pt able to track at midline but when prompted to track laterally pt often remained with eyes at midline.) Visual Fields: Other (comment) (Difficult to fully assess. At this time pt demonsnstrates R and L visual field deficits mostly in upper quadrants but also in lower R quadrant.)                Pertinent Vitals/Pain Pain Assessment Pain Assessment: 0-10 Pain Score: 6  Pain Location: mouth Pain Descriptors / Indicators: Other (Comment) (Swollen) Pain Intervention(s): Limited activity within patient's tolerance, Monitored during session     Hand Dominance Right   Extremity/Trunk Assessment Upper Extremity Assessment Upper Extremity Assessment: Generalized weakness (WNL sensation; decreased fine motor  coordination for sequential finger touching but there could be cognitive element/lethargy at play. Poor groos motor coordination bilaterally via finger to nose test.)   Lower Extremity Assessment Lower Extremity Assessment: Defer to PT evaluation   Cervical / Trunk Assessment Cervical / Trunk Assessment: Kyphotic   Communication Communication Communication: Expressive difficulties   Cognition Arousal/Alertness: Awake/alert Behavior During Therapy: WFL for tasks assessed/performed Overall Cognitive Status: Within Functional Limits for tasks assessed                                                        Home Living Family/patient expects to be discharged to:: Private residence Living Arrangements: Alone Available Help at Discharge: Personal care attendant;Available PRN/intermittently Type of Home: Apartment Home Access: Level entry     Home Layout: One level     Bathroom Shower/Tub: Teacher, early years/pre: Standard Bathroom Accessibility: Yes How Accessible: Accessible via walker Home Equipment: Rollator (4 wheels);Rolling Walker (2 wheels);Cane - single point;Grab bars - tub/shower;Shower Galvan          Prior Functioning/Environment Prior Level of Function : Needs assist       Physical Assist : ADLs (physical)   ADLs (physical): IADLs Mobility Comments: Pt reportedly houshold and community ambulator with RW primarily and some cane use as well. Pt uses scooter when at the grocery store. Pt does not drive. ADLs Comments: Pt reports independence with ADL's and difficulty with IADL's and no support other than assist to get  groceries.        OT Problem List: Decreased strength;Decreased activity tolerance;Impaired balance (sitting and/or standing);Impaired vision/perception;Decreased coordination      OT Treatment/Interventions: Self-care/ADL training;Therapeutic exercise;Neuromuscular education;Therapeutic  activities;Visual/perceptual remediation/compensation;Patient/family education;Balance training    OT Goals(Current goals can be found in the care plan section) Acute Rehab OT Goals Patient Stated Goal: Get stronger. OT Goal Formulation: With patient Time For Goal Achievement: 06/16/22 Potential to Achieve Goals: Good  OT Frequency: Min 2X/week    Co-evaluation PT/OT/SLP Co-Evaluation/Treatment: Yes Reason for Co-Treatment: To address functional/ADL transfers PT goals addressed during session: Mobility/safety with mobility;Balance;Proper use of DME OT goals addressed during session: ADL's and self-care                       End of Session Equipment Utilized During Treatment: Rolling walker (2 wheels);Gait belt  Activity Tolerance: Patient tolerated treatment well Patient left: in chair;with call bell/phone within reach;with chair alarm set  OT Visit Diagnosis: Unsteadiness on feet (R26.81);Other abnormalities of gait and mobility (R26.89);Muscle weakness (generalized) (M62.81);Other symptoms and signs involving the nervous system (R29.898);Dizziness and giddiness (R42)                Time: 1624-4695 OT Time Calculation (min): 33 min Charges:  OT General Charges $OT Visit: 1 Visit OT Evaluation $OT Eval Low Complexity: 1 Low  Timothy Galvan OT, MOT   Timothy Galvan 06/02/2022, 9:43 AM

## 2022-06-02 NOTE — Consult Note (Signed)
I connected with  Timothy Galvan on 06/02/22 by a video enabled telemedicine application and verified that I am speaking with the correct person using two identifiers.   I discussed the limitations of evaluation and management by telemedicine. The patient expressed understanding and agreed to proceed.  Location of patient: [facility] Location of physician: [facility]   Neurology Consultation Reason for Consult: stroke Referring Physician: Dr Roxan Hockey  CC: stroke  History is obtained from: Patient, chart review  HPI: Timothy Galvan is a 66 y.o. male with past medical history of hypertension, hyperlipidemia, peripheral vascular disease, cocaine use disorder, nicotine use disorder who presented with right-sided weakness and slurred speech.  Patient speech is significantly slurred and difficult to understand.  States about 3 days ago he suddenly noticed weakness on the right side of his face only became slurred as if he does not have his teeth.  Last known normal: 11/70/2023 around 2 PM Event happened at home No tPA as outside window No thrombectomy as outside window mRS 0  ROS: All other systems reviewed and negative except as noted in the HPI.   Past Medical History:  Diagnosis Date   Asthma    Avascular necrosis of bone of hip ([place])    Blood clot in vein    Cancer ([place])    Coronary artery disease    GERD (gastroesophageal reflux disease)    Heart disease    Hypertension    Pneumonia     Social History:  reports that he has been smoking cigarettes. He has been smoking an average of 1 pack per day. He has never used smokeless tobacco. He reports current alcohol use of about 12.0 standard drinks of alcohol per week. He reports that he does not currently use drugs after having used the following drugs: Cocaine.   Medications Prior to Admission  Medication Sig Dispense Refill Last Dose   acetaminophen (TYLENOL) 325 MG tablet Take 2 tablets (650 mg  total) by mouth every 6 (six) hours as needed for mild pain (or Fever >/= 101). (Patient not taking: Reported on 02/04/2019)      alendronate (FOSAMAX) 70 MG tablet Take 1 tablet (70 mg total) by mouth once a week. Take with a full glass of water on an empty stomach. (Patient not taking: Reported on 01/16/2019) 4 tablet 0    aspirin EC 81 MG tablet Take 81 mg by mouth at bedtime.      atorvastatin (LIPITOR) 40 MG tablet Take 40 mg by mouth at bedtime.       clindamycin (CLEOCIN) 150 MG capsule Take 2 capsules (300 mg total) by mouth 3 (three) times daily. 42 capsule 0    gabapentin (NEURONTIN) 300 MG capsule Take 600 mg by mouth 3 (three) times daily.      HYDROcodone-acetaminophen (NORCO/VICODIN) 5-325 MG tablet Take 1 tablet by mouth every 6 (six) hours as needed for moderate pain. 15 tablet 0    ibuprofen (ADVIL) 600 MG tablet Take 600 mg by mouth every 4 (four) hours as needed.      Ipratropium-Albuterol (COMBIVENT) 20-100 MCG/ACT AERS respimat Inhale 1 puff into the lungs every 6 (six) hours as needed for wheezing.      isosorbide mononitrate (IMDUR) 60 MG 24 hr tablet Take 1 tablet (60 mg total) by mouth at bedtime. 15 tablet 0    latanoprost (XALATAN) 0.005 % ophthalmic solution Place 1 drop into the right eye at bedtime.      Melatonin  5 MG TABS Take 5 mg by mouth at bedtime.      meloxicam (MOBIC) 15 MG tablet Take 1 tablet (15 mg total) by mouth daily. (Patient not taking: Reported on 01/16/2019) 30 tablet 0    metoprolol tartrate (LOPRESSOR) 25 MG tablet Take 1 tablet (25 mg total) by mouth 2 (two) times daily. 60 tablet 0    nitroGLYCERIN (NITROSTAT) 0.4 MG SL tablet Place 1 tablet (0.4 mg total) under the tongue every 5 (five) minutes as needed for chest pain. 5 tablet 0    omeprazole (PRILOSEC) 20 MG capsule Take 20 mg by mouth at bedtime.       Saccharomyces boulardii (PROBIOTIC) 250 MG CAPS Take 1 capsule by mouth 3 (three) times daily. 60 capsule 0    traMADol (ULTRAM) 50 MG tablet Take  1 tablet (50 mg total) by mouth every 6 (six) hours as needed. 8 tablet 0       Exam: Current vital signs: BP (!) 157/76   Pulse 74   Temp 97.8 F (36.6 C) (Oral)   Resp 16   Ht '5\' 3"'$  (1.6 m)   Wt 63.5 kg   SpO2 98%   BMI 24.80 kg/m  Vital signs in last 24 hours: Temp:  [97.6 F (36.4 C)-98 F (36.7 C)] 97.8 F (36.6 C) (11/21 0534) Pulse Rate:  [43-92] 74 (11/21 0919) Resp:  [14-27] 16 (11/21 0919) BP: (145-174)/(66-105) 157/76 (11/21 0534) SpO2:  [69 %-100 %] 98 % (11/21 0919) FiO2 (%):  [21 %] 21 % (11/20 1949) Weight:  [63.5 kg] 63.5 kg (11/20 1251)   Physical Exam  Constitutional: Appears well-developed and well-nourished.  Psych: Affect appropriate to situation Neuro: AOx3, no aphasia, significantly dysarthric speech, cranial nerves II to XII appear grossly intact except decreased sensation on right side of face, decreased sensation on right upper extremity, antigravity strength without drift, FTN intact bilaterally  NIHSS 2  INPUTS: 1A: Level of consciousness --> 0 = Alert; keenly responsive 1B: Ask month and age --> 0 = Both questions right 1C: 'Blink eyes' & 'squeeze hands' --> 0 = Performs both tasks 2: Horizontal extraocular movements --> 0 = Normal 3: Visual fields --> 0 = No visual loss 4: Facial palsy --> 0 = Normal symmetry 5A: Left arm motor drift --> 0 = No drift for 10 seconds 5B: Right arm motor drift --> 0 = No drift for 10 seconds 6A: Left leg motor drift --> 0 = No drift for 5 seconds 6B: Right leg motor drift --> 0 = No drift for 5 seconds 7: Limb Ataxia --> 0 = No ataxia 8: Sensation --> 1 = Mild-moderate loss: less sharp/more dull  9: Language/aphasia --> 0 = Normal; no aphasia 10: Dysarthria --> 1 = Mild-moderate dysarthria: slurring but can be understood 11: Extinction/inattention --> 0 = No abnormality    I have reviewed labs in epic and the results pertinent to this consultation are: CBC:  Recent Labs  Lab 06/01/22 1431  WBC 6.1   NEUTROABS 1.9  HGB 13.0  HCT 41.9  MCV 93.9  PLT 782    Basic Metabolic Panel:  Lab Results  Component Value Date   NA 143 06/01/2022   K 3.3 (L) 06/01/2022   CO2 24 06/01/2022   GLUCOSE 119 (H) 06/01/2022   BUN 13 06/01/2022   CREATININE 0.90 06/01/2022   CALCIUM 8.9 06/01/2022   GFRNONAA >60 06/01/2022   GFRAA >60 02/04/2019   Lipid Panel:  Lab Results  Component Value  Date   LDLCALC 52 06/02/2022   HgbA1c: No results found for: "HGBA1C" Urine Drug Screen:     Component Value Date/Time   LABOPIA NONE DETECTED 03/07/2022 1952   COCAINSCRNUR POSITIVE (A) 03/07/2022 1952   COCAINSCRNUR POSITIVE 02/06/2014 2316   LABBENZ NONE DETECTED 03/07/2022 1952   AMPHETMU NONE DETECTED 03/07/2022 1952   THCU NONE DETECTED 03/07/2022 1952   LABBARB NONE DETECTED 03/07/2022 1952    Alcohol Level     Component Value Date/Time   ETH <10 06/01/2022 1431     I have reviewed the images obtained: CT head without contrast 06/01/2022: There are no signs of bleeding within the cranium. There is new 1.5 cm low-density in left basal ganglia suggesting acute/subacute or old infarct. If clinically warranted, follow-up MRI may be considered.  CTA neck with and without contrast 06/01/2022:  Unchanged occlusion of the right ICA at its origin. No other hemodynamically significant stenosis in the neck. Aortic atherosclerosis.  MRI brain with and without contrast 06/01/2022: Acute to subacute infarcts in the left basal ganglia and right pons without hemorrhage or mass effect. 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  MRA head without contrast 06/01/2022: Unchanged occlusion of the right ICA from the neck through the communicating segment where there is reconstitution via collateral flow. 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 Focal moderate to severe stenosis of the distal right M1 segment and  short-segment severe stenosis of the left P2/P3 segment.      ASSESSMENT/PLAN: 66 year old male with multiple comorbidities as noted in HPI presented with new onset right-sided weakness and slurred speech.  Acute ischemic stroke Right carotid occlusion Intracranial stenosis Hypertension Hyperlipidemia Nicotine use disorder Peripheral vascular disease -Etiology: Small vessel disease versus embolic  Recommendations: -Recommend aspirin 81 mg daily and Plavix and 5 mg daily for 3 months followed by aspirin 81 mg daily -Continue atorvastatin 80 mg daily -Smoking cessation counseling. -If TTE does not show any evidence of thrombus, recommend 30-day event monitor at the time of discharge -Dr. Quinn Axe had a discussion with neuro interventionalist as follows: Given chronic total occlusion R ICA he is not a candidate for revascularization on that side. Although his L ICA stenosis is unchanged from Aug, since the other side is chronically occluded he is at high risk for severe stroke if the L ICA occludes as well. D/w Dr. Dr Janean Sark of neuro-IR who recommended he be seen shortly after hospital discharge in IR clinic to discuss possible stenting of the L intracranial ICA. Close outpatient f/u for this is preferred to stenting electively during this hospitalization 2/2 his current acute infarcts.  -PT/OT/speech therapy -Goal blood pressure: Normotension -Stroke education, modification of stroke risk factors -Follow-up with neurology in 3 months   Thank you for allowing [place] to participate in the care of this patient. If you have any further questions, please contact  me or neurohospitalist.   Zeb Comfort [facility]ile[facility] neurohospitalist

## 2022-06-02 NOTE — Progress Notes (Signed)
*  PRELIMINARY RESULTS* Echocardiogram 2D Echocardiogram has been performed.  Timothy Galvan 06/02/2022, 10:30 AM

## 2022-06-02 NOTE — Plan of Care (Signed)
  Problem: Acute Rehab OT Goals (only OT should resolve) Goal: Pt. Will Perform Grooming Flowsheets (Taken 06/02/2022 0948) Pt Will Perform Grooming:  with modified independence  standing Goal: Pt. Will Perform Lower Body Bathing Flowsheets (Taken 06/02/2022 0948) Pt Will Perform Lower Body Bathing:  with modified independence  sitting/lateral leans Goal: Pt. Will Perform Lower Body Dressing Flowsheets (Taken 06/02/2022 0948) Pt Will Perform Lower Body Dressing:  with modified independence  sitting/lateral leans Goal: Pt. Will Transfer To Toilet Flowsheets (Taken 06/02/2022 [phone]) Pt Will Transfer to Toilet:  with modified independence  ambulating Goal: Pt. Will Perform Toileting-Clothing Manipulation Flowsheets (Taken 06/02/2022 0948) Pt Will Perform Toileting - Clothing Manipulation and hygiene:  with modified independence  sitting/lateral leans Goal: Pt/Caregiver Will Perform Home Exercise Program Flowsheets (Taken 06/02/2022 [phone]) Pt/caregiver will Perform Home Exercise Program:  Increased strength  Both right and left upper extremity  Independently Goal: OT Additional ADL Goal #1 Flowsheets (Taken 06/02/2022 0948) Additional ADL Goal #1: Pt will demosntrate improved visual tracking and and visual fields by smooth tracking and identification of peripheral objecst within normal limits during formal testing.  Kieffer Blatz OT, MOT

## 2022-06-02 NOTE — Progress Notes (Signed)
Inpatient Rehab Admissions Coordinator:    I spoke with pt. And multiple family members regarding potential CIR admit. They are interested. Daughter, Hinton Dyer, working on setting up a rotation to provide 24/7 support at d/c. I will open a case with his insurance and pursue for auth.   Clemens Catholic, [place], [place] Admissions Coordinator  [phone] ([place]) [phone] (office)

## 2022-06-02 NOTE — Plan of Care (Signed)
  Problem: Acute Rehab PT Goals(only PT should resolve) Goal: Pt Will Go Supine/Side To Sit Outcome: Progressing Flowsheets (Taken 06/02/2022 1034) Pt will go Supine/Side to Sit: Independently Goal: Patient Will Transfer Sit To/From Stand Outcome: Progressing Flowsheets (Taken 06/02/2022 1034) Patient will transfer sit to/from stand: with min guard assist Goal: Pt Will Transfer Bed To Chair/Chair To Bed Outcome: Progressing Flowsheets (Taken 06/02/2022 1034) Pt will Transfer Bed to Chair/Chair to Bed: with supervision Goal: Pt Will Ambulate Outcome: Progressing Flowsheets (Taken 06/02/2022 1034) Pt will Ambulate:  with supervision  with rolling walker  75 feet   Zigmund Gottron, SPT

## 2022-06-02 NOTE — Evaluation (Signed)
Speech Language Pathology Evaluation Patient Details Name: Timothy Galvan MRN: [id number] DOB: 1955-11-04 Today's Date: 06/02/2022 Time: 1324-4010 SLP Time Calculation (min) (ACUTE ONLY): 23 min  Problem List:  Patient Active Problem List   Diagnosis Date Noted   Stroke (cerebrum) ([place]) 06/01/2022   PAD (peripheral artery disease) ([place]) 06/01/2022   Colon cancer ([place]) 06/01/2022   COPD (chronic obstructive pulmonary disease) ([place]) 06/01/2022   Blood clot in vein 03/08/2022   Cancer ([place]) 03/08/2022   Chest pain 01/16/2019   GERD (gastroesophageal reflux disease) 01/10/2018   CAD (coronary artery disease) 01/10/2018   Closed compression fracture of body of L1 vertebra ([place]) 01/10/2018   HLD (hyperlipidemia) 01/10/2018   Past Medical History:  Past Medical History:  Diagnosis Date   Asthma    Avascular necrosis of bone of hip ([facility])    Blood clot in vein    Cancer ([place])    Coronary artery disease    GERD (gastroesophageal reflux disease)    Heart disease    Hypertension    Pneumonia    Past Surgical History:  Past Surgical History:  Procedure Laterality Date   FEMORAL BYPASS     MANDIBLE SURGERY     WRIST SURGERY     HPI:  Timothy Galvan is a 66 y.o. male with medical history significant of Asthma, CAD, GERD, HTN, Colon cancer, PAD, and Cocaine use.      Pt presenting after 3 days of dizziness, and stuttering. Acute onset. Feeling off balance w/ movement. Difficulty ambulating for past 2 days. Denies CP, SOB, DOE. Endorsing Wheezing offa and on lately. Pts son called EMS after pts stutterintg and dizziness did not resolve. Associated w/ increased fatigue over the last few days.      Level V caveat appies as pt unable to provide much history due to speech change from stroke. 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   Assessment / Plan /  Recommendation Clinical Impression  Pt exhibits moderate dysarthria characterized by imprecise articulation, low vocal intensity and decreased overall intelligibility within words-simple sentences with 50-75% intelligibility noted depending on length of utterance and shared context.  Pt exhibited intermittent dysfluency within word-short phrases as well with part-word repetitions (ie:"h-h-help") and prolonged phoneme production  during conversation impacting overall intelligibility within speaking tasks.  [name] also exhibited difficulty with expressive tasks and sustained attention/memory overall during session with clock formation and retrieval of new information during SLUMS assessment ([place] Mental Status Examination) with an overall score obtained of 14/26 (with 7th grade education noted by pt impacting overall score).  Typical score for this assessment is 27/30.  Recommend continued ST to focus on above mentioned deficits.  Thank you for this consult.    SLP Assessment  SLP Recommendation/Assessment: Patient needs continued Speech Language Pathology Services SLP Visit Diagnosis: Dysarthria and anarthria (R47.1);Attention and concentration deficit Attention and concentration deficit following: Cerebral infarction    Recommendations for follow up therapy are one component of a multi-disciplinary discharge planning process, led by the attending physician.  Recommendations may be updated based on patient status, additional functional criteria and insurance authorization.    Follow Up Recommendations  Follow physician's recommendations for discharge plan and follow up therapies    Assistance Recommended at Discharge  Frequent or constant Supervision/Assistance  Functional Status Assessment Patient has had a recent decline in their functional status and demonstrates the ability  to make significant improvements in function in a reasonable and predictable amount of time.   Frequency and Duration min 2x/week  1 week      SLP Evaluation Cognition  Overall Cognitive Status: No family/caregiver present to determine baseline cognitive functioning Arousal/Alertness: Awake/alert Orientation Level: Oriented to person;Oriented to place;Disoriented to situation;Oriented to time (speech intelligibility affects overall performance) Year: 2023 Month: November Day of Week: Correct Attention: Sustained Sustained Attention: Impaired Sustained Attention Impairment: Verbal basic;Functional basic Memory: Impaired Memory Impairment: Decreased short term memory;Decreased recall of new information;Retrieval deficit Decreased Short Term Memory: Verbal basic;Functional basic Awareness: Appears intact Problem Solving: Appears intact Behaviors: Perseveration Safety/Judgment: Appears intact       Comprehension  Auditory Comprehension Overall Auditory Comprehension: Appears within functional limits for tasks assessed Yes/No Questions: Within Functional Limits Commands: Within Functional Limits Conversation: Simple Other Conversation Comments: dysarthria impacts overall expressive communication Interfering Components: Working Marine scientist;Other (comment) (intelligibility) EffectiveTechniques: Increased volume;Repetition;Slowed speech Visual Recognition/Discrimination Discrimination: Exceptions to [facility] Other Visual Recogniton/Discrimination Comments: R eye swollen; glasses worn; pt denies reading difficulty; read single words on environmental signs Reading Comprehension Reading Status: Not tested    Expression Expression Primary Mode of Expression: Verbal Verbal Expression Overall Verbal Expression: Impaired Initiation: No impairment Level of Generative/Spontaneous Verbalization: Sentence Repetition: No impairment Naming: Not tested Pragmatics: No impairment Interfering Components: Attention;Speech intelligibility Non-Verbal Means of Communication: Not  applicable Written Expression Dominant Hand: Right Written Expression: Exceptions to [facility] Interfering Components: Thought organization;Legibility   Oral / Motor  Oral Motor/Sensory Function Overall Oral Motor/Sensory Function: Mild impairment Facial ROM: Within Functional Limits Facial Symmetry: Within Functional Limits Facial Sensation: Reduced right Motor Speech Overall Motor Speech: Appears within functional limits for tasks assessed Respiration: Impaired Level of Impairment: Sentence Phonation: Low vocal intensity Resonance: Within functional limits Articulation: Impaired Level of Impairment: Word Intelligibility: Intelligibility reduced Word: 25-49% accurate Phrase: 25-49% accurate Sentence: 25-49% accurate Conversation: Not tested Motor Planning: Not tested Motor Speech Errors: Not applicable            Elvina Sidle, M.S., CCC-SLP 06/02/2022, 2:51 PM

## 2022-06-02 NOTE — Evaluation (Addendum)
Physical Therapy Evaluation Patient Details Name: Timothy Galvan MRN: [id number] DOB: 04/09/1956 Today's Date: 06/02/2022  History of Present Illness  Timothy Galvan is a 66 y.o. male with medical history significant of Asthma, CAD, GERD, HTN, Colon cancer, and PAD. Pt presenting after 3 days acute onset of dizziness and stuttering. Feeling off balance w/ movement. Difficulty ambulating for past 2 days. Denies CP, SOB, DOE. Endorsing Wheezing offa and on lately. Pts son called EMS after pts stutterintg and dizziness did not resolve. Associated w/ increased fatigue over the last few days.   Clinical Impression  Patient with slightly labored movement for sitting up at bedside. Patient presenting with grossly diminished strength (4-/5 MMT) in left LE. Patient demonstrates slowed effortful cadence for hallway ambulation with decreased dorsiflexion bilaterally, requiring min guard/RW for stability and limited mostly due to c/o fatigue and dizziness. Patient tolerated sitting up in chair after therapy. Patient will benefit from continued skilled physical therapy in hospital and recommended venue below to increase strength, balance, endurance for safe ADLs and gait.     Recommendations for follow up therapy are one component of a multi-disciplinary discharge planning process, led by the attending physician.  Recommendations may be updated based on patient status, additional functional criteria and insurance authorization.  Follow Up Recommendations Acute inpatient rehab (3hours/day)      Assistance Recommended at Discharge Intermittent Supervision/Assistance  Patient can return home with the following  A little help with walking and/or transfers;A lot of help with bathing/dressing/bathroom;Assistance with cooking/housework;Help with stairs or ramp for entrance    Equipment Recommendations None recommended by PT  Recommendations for Other Services       Functional Status Assessment Patient has  had a recent decline in their functional status and demonstrates the ability to make significant improvements in function in a reasonable and predictable amount of time.     Precautions / Restrictions Precautions Precautions: Fall Restrictions Weight Bearing Restrictions: No      Mobility  Bed Mobility Overal bed mobility: Modified Independent Bed Mobility: Supine to Sit     Supine to sit: Supervision     General bed mobility comments: slightly labored movement    Transfers Overall transfer level: Needs assistance Equipment used: Rolling walker (2 wheels) Transfers: Sit to/from Stand Sit to Stand: Min assist           General transfer comment: patient requiring min assist boost to stand, able to sit in chair safely with min guard following ambulation    Ambulation/Gait Ambulation/Gait assistance: Min guard Gait Distance (Feet): 35 Feet Assistive device: Rolling walker (2 wheels) Gait Pattern/deviations: Decreased step length - right, Decreased step length - left, Decreased dorsiflexion - left, Decreased dorsiflexion - right, Decreased stride length Gait velocity: slow     General Gait Details: patient with slowed cadence, able to ambulate in hallway with min guard/RW, limited mostly due to fatigue and c/o dizziness  Stairs            Wheelchair Mobility    Modified Rankin (Stroke Patients Only)       Balance Overall balance assessment: Needs assistance Sitting-balance support: No upper extremity supported, Feet supported Sitting balance-Leahy Scale: Good Sitting balance - Comments: fair/good seated EOB   Standing balance support: Bilateral upper extremity supported, During functional activity, Reliant on assistive device for balance Standing balance-Leahy Scale: Fair Standing balance comment: with RW  Pertinent Vitals/Pain Pain Assessment Pain Assessment: No/denies pain    Home Living Family/patient  expects to be discharged to:: Private residence Living Arrangements: Alone Available Help at Discharge: Personal care attendant;Available PRN/intermittently Type of Home: Apartment Home Access: Level entry       Home Layout: One level Home Equipment: Rollator (4 wheels);Rolling Walker (2 wheels);Cane - single point;Grab bars - tub/shower;Shower seat      Prior Function Prior Level of Function : Needs assist           ADLs (physical): IADLs Mobility Comments: 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 Pt does not drive and uses electric scooter at grocery store ADLs Comments: Independent with ADLs, reports he needs some assistance with iADLs but does not currently have help     Hand Dominance   Dominant Hand: Right    Extremity/Trunk Assessment   Upper Extremity Assessment Upper Extremity Assessment: Generalized weakness    Lower Extremity Assessment Lower Extremity Assessment: Generalized weakness;LLE deficits/detail LLE Deficits / Details: grossly weaker on left (4-/5 MMT) in all major muscles group, diminshed sensation L L5 dermatome LLE Sensation: decreased light touch    Cervical / Trunk Assessment Cervical / Trunk Assessment: Normal  Communication   Communication: Expressive difficulties (new onset of slurred speech and stuttering)  Cognition Arousal/Alertness: Awake/alert Behavior During Therapy: WFL for tasks assessed/performed Overall Cognitive Status: Within Functional Limits for tasks assessed                                          General Comments      Exercises     Assessment/Plan    PT Assessment Patient needs continued PT services  PT Problem List Decreased strength;Decreased activity tolerance;Decreased mobility;Decreased balance       PT Treatment Interventions DME instruction;Balance training;Gait training;Stair training;Patient/family education;Functional mobility  training;Therapeutic activities;Therapeutic exercise    PT Goals (Current goals can be found in the Care Plan section)  Acute Rehab PT Goals Patient Stated Goal: return home after rehab PT Goal Formulation: With patient Time For Goal Achievement: 06/09/22 Potential to Achieve Goals: Good    Frequency Min 4X/week     Co-evaluation PT/OT/SLP Co-Evaluation/Treatment: Yes Reason for Co-Treatment: To address functional/ADL transfers PT goals addressed during session: Mobility/safety with mobility;Balance;Proper use of DME OT goals addressed during session: ADL's and self-care       AM-PAC PT "6 Clicks" Mobility  Outcome Measure Help needed turning from your back to your side while in a flat bed without using bedrails?: None Help needed moving from lying on your back to sitting on the side of a flat bed without using bedrails?: None Help needed moving to and from a bed to a chair (including a wheelchair)?: A Little Help needed standing up from a chair using your arms (e.g., wheelchair or bedside chair)?: A Little Help needed to walk in hospital room?: A Little Help needed climbing 3-5 steps with a railing? : A Lot 6 Click Score: 19    End of Session Equipment Utilized During Treatment: Gait belt Activity Tolerance: Patient tolerated treatment well;Patient limited by fatigue Patient left: in chair;with call bell/phone within reach Nurse Communication: Mobility status PT Visit Diagnosis: Unsteadiness on feet (R26.81);Muscle weakness (generalized) (M62.81);Other abnormalities of gait and mobility (R26.89)    Time: 3716-9678 PT Time Calculation (min) (ACUTE ONLY): 31 min   Charges:  PT Evaluation $PT Eval Moderate Complexity: 1 Mod PT Treatments $Therapeutic Activity: 23-37 mins        Zigmund Gottron, SPT

## 2022-06-02 NOTE — Progress Notes (Signed)
PROGRESS NOTE   Timothy Galvan, is a 66 y.o. male, DOB - 1956/05/17, RAQ:[id number]  Admit date - 06/01/2022   Admitting Physician Timothy Dickens, MD  Outpatient Primary MD for the patient is [facility]  LOS - 1  Chief Complaint  Patient presents with   Dizziness       Brief Narrative:  66 y.o. male with medical history significant of Asthma, CAD, GERD, HTN, Colon cancer, PAD, and Cocaine use admitted on 06/01/2022 with acute and subacute strokes -Awaiting transfer to inpatient rehab/CIR    -Assessment and Plan: 1)Acute and subacute strokes with speech deficits and left-sided hemiparesis- -MRI/MRA consistent with Acute to Subacute infarct in L basal ganglia and R pons w/o hemorrhage, chronic small-vessel ischemic change and remote cortical infarct in the right frontoparietal region -expressive aphasia, left-sided hemiparesis especially of the left upper extremity, -Echo with EF of 45 to 50% with wall motion abnormalities, no aortic stenosis, mild pulmonary hypertension -EF is down from  55% back in 2022 -Total cholesterol 134, triglycerides 165, HDL 49, LDL 52---Even if his lipid panel is within desired limits, patient should still take Lipitor for it's Pleiotropic effects (beyond cholesterol lowering benefits) -A1c pending -Please take Aspirin 81 mg daily along with Plavix 75 mg daily for 90 days then after that STOP the Plavix  and continue ONLY Aspirin 81 mg daily indefinitely--for secondary stroke Prevention (Per The multicenter SAMMPRIS trial) -30 day Event/Holter Monitor advised Post discharge- -PT/OT eval appreciated recommends  2)HFrEF--- patient with history of cardiomyopathy in setting of alcohol and cocaine abuse -Previously [facility] -Echo as noted above #1, additional echo findings include significant wall motion normalities as listed here-The antero-lateral wall, posterior wall, and basal inferior segment are hypokinetic. The entire anterior  wall, entire septum, entire apex, and mid and distal inferior wall are normal.   3)PAD--CTA neck, and MRA angio head consistent with occlusion of the right ICA at its origin.  -occlusion of the right ICA from the neck through the communicating segment where there is reconstitution via collateral flow. 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 -Focal moderate to severe stenosis of the distal right M1 segment and short-segment severe stenosis of the left P2/P3 segment. -Lipitor as above #1 Please take Aspirin 81 mg daily along with Plavix 75 mg daily for 90 days then after that STOP the Plavix  and continue ONLY Aspirin 81 mg daily indefinitely--for secondary stroke Prevention (Per The multicenter SAMMPRIS trial) -Outpatient follow-up with vascular surgery/IR strongly advised  4)Polysubstance abuse--- mostly cocaine, tobacco and alcohol -UDS is again positive for cocaine -Abstinence from alcohol tobacco and cocaine strongly advised  5)HTN--- permissive hypertension initially -Okay to use IV hydralazine as needed systolic pressure above 456 mmHg  6)COPD/Asthma--- no acute exacerbation at this time smoking cessation is advised  7)GERD---c/n protonix   8)Etoh Abuse--- risk of DTs - -Use Lorazepam per CIWA protocol, folic acid and thiamine as ordered  9) tobacco abuse--okay cessation advised, nicotine patch is ordered  Disposition/Need for in-Hospital Stay- patient unable to be discharged at this time due to acute and subacute strokes awaiting transfer to inpatient rehab/CIR  Status is: Inpatient   Disposition: The patient is from: Home              Anticipated d/c is to: CIR              Anticipated d/c date is: 1 day  Patient currently is not medically stable to d/c. Barriers: Not Clinically Stable-   Code Status :  -  Code Status: Full Code   Family Communication:    Discussed with daughter at bedside  DVT Prophylaxis  :   - SCDs   heparin injection 5,000  Units Start: 06/01/22 2200   Lab Results  Component Value Date   PLT 242 06/01/2022    Inpatient Medications  Scheduled Meds:  albuterol  2.5 mg Nebulization TID   aspirin EC  81 mg Oral Daily   atorvastatin  40 mg Oral QHS   clopidogrel  75 mg Oral Daily   gabapentin  600 mg Oral TID   heparin  5,000 Units Subcutaneous Q8H   latanoprost  1 drop Right Eye QHS   melatonin  3 mg Oral QHS   nicotine  21 mg Transdermal Daily   pantoprazole  40 mg Oral Daily   Continuous Infusions:  sodium chloride Stopped (06/01/22 2117)   PRN Meds:.acetaminophen **OR** acetaminophen (TYLENOL) oral liquid 160 mg/5 mL **OR** acetaminophen, hydrALAZINE, HYDROcodone-acetaminophen, ipratropium-albuterol, ondansetron **OR** ondansetron (ZOFRAN) IV, senna-docusate   Anti-infectives (From admission, onward)    None         Subjective: Timothy Galvan today has no fevers, no emesis,  No chest pain,  - Speech deficits persist -left-sided weakness persist -Daughter at bedside, questions answered   Objective: Vitals:   06/02/22 0919 06/02/22 1435 06/02/22 1445 06/02/22 1630  BP:   (!) 148/81 (!) 172/78  Pulse: 74 (!) 53 66 (!) 53  Resp: '16 16 18 '$ (!) 21  Temp:   98.1 F (36.7 C) 98.4 F (36.9 C)  TempSrc:   Oral Oral  SpO2: 98% 96% 98% 100%  Weight:      Height:        Intake/Output Summary (Last 24 hours) at 06/02/2022 1711 Last data filed at 06/02/2022 0900 Gross per 24 hour  Intake 1020 ml  Output 2700 ml  Net -1680 ml   Filed Weights   06/01/22 1251  Weight: 63.5 kg    Physical Exam  Gen:- Awake Alert, no acute distress HEENT:- Timothy Galvan.AT, No sclera icterus Neck-Supple Neck,No JVD,.  Lungs-  CTAB , fair symmetrical air movement CV- S1, S2 normal, regular  Abd-  +ve B.Sounds, Abd Soft, No tenderness,    Extremity/Skin:- No  edema, pedal pulses present  Psych-affect is appropriate, oriented x3 Neuro-expressive aphasia, left-sided hemiparesis especially of the left upper  extremity, no new focal deficits, no tremors  Data Reviewed: I have personally reviewed following labs and imaging studies  CBC: Recent Labs  Lab 06/01/22 1431  WBC 6.1  NEUTROABS 1.9  HGB 13.0  HCT 41.9  MCV 93.9  PLT 417   Basic Metabolic Panel: Recent Labs  Lab 06/01/22 1431  NA 143  K 3.3*  CL 114*  CO2 24  GLUCOSE 119*  BUN 13  CREATININE 0.90  CALCIUM 8.9   GFR: Estimated Creatinine Clearance: 63.2 mL/min (by C-G formula based on SCr of 0.9 mg/dL). Liver Function Tests: Recent Labs  Lab 06/01/22 1431  AST 24  ALT 24  ALKPHOS 103  BILITOT 0.3  PROT 6.9  ALBUMIN 3.6   Radiology Studies: ECHOCARDIOGRAM COMPLETE  Result Date: 06/02/2022    ECHOCARDIOGRAM REPORT   Patient Name:   Timothy Galvan Date of Exam: 06/02/2022 Medical Rec #:  [id number]       Height:       63.0 in Accession #:    [id number]  Weight:       140.0 lb Date of Birth:  Dec 04, 1955       BSA:          1.662 m Patient Age:    28 years        BP:           157/76 mmHg Patient Gender: M               HR:           54 bpm. Exam Location:  [facility] Procedure: 2D Echo, Cardiac Doppler and Color Doppler Indications:    Stroke  History:        Patient has no prior history of Echocardiogram examinations.                 CAD, Stroke, PAD and COPD, Signs/Symptoms:Chest Pain; Risk                 Factors:Dyslipidemia. Colon CA.  Sonographer:    Wenda Low Referring Phys: [place]  1. Left ventricular ejection fraction, by estimation, is 45 to 50%. The left ventricle has mildly decreased function. The left ventricle demonstrates regional wall motion abnormalities (see scoring diagram/findings for description). There is mild concentric left ventricular hypertrophy. Left ventricular diastolic parameters were normal.  2. Right ventricular systolic function is normal. The right ventricular size is normal. There is mildly elevated pulmonary artery systolic pressure. The estimated right  ventricular systolic pressure is 42.6 mmHg.  3. Left atrial size was mildly dilated.  4. Right atrial size was mildly dilated.  5. The mitral valve is grossly normal. Mild mitral valve regurgitation.  6. The aortic valve is tricuspid. Aortic valve regurgitation is not visualized. Aortic valve sclerosis/calcification is present, without any evidence of aortic stenosis. Aortic valve mean gradient measures 3.0 mmHg.  7. The inferior vena cava is normal in size with greater than 50% respiratory variability, suggesting right atrial pressure of 3 mmHg. Comparison(s): Prior images unable to be directly viewed. Patient with reported history of cardiomyopathy followed at [facility] (fluctuating LVEF, 45% in 2020 and 55% in 2022). FINDINGS  Left Ventricle: Left ventricular ejection fraction, by estimation, is 45 to 50%. The left ventricle has mildly decreased function. The left ventricle demonstrates regional wall motion abnormalities. The left ventricular internal cavity size was normal in size. There is mild concentric left ventricular hypertrophy. Left ventricular diastolic parameters were normal.  LV Wall Scoring: The antero-lateral wall, posterior wall, and basal inferior segment are hypokinetic. The entire anterior wall, entire septum, entire apex, and mid and distal inferior wall are normal. Right Ventricle: The right ventricular size is normal. No increase in right ventricular wall thickness. Right ventricular systolic function is normal. There is mildly elevated pulmonary artery systolic pressure. The tricuspid regurgitant velocity is 3.22  m/s, and with an assumed right atrial pressure of 3 mmHg, the estimated right ventricular systolic pressure is 83.4 mmHg. Left Atrium: Left atrial size was mildly dilated. Right Atrium: Right atrial size was mildly dilated. Prominent Chiari network. Pericardium: There is no evidence of pericardial effusion. Mitral Valve: The mitral valve is grossly normal. Mild mitral valve  regurgitation. MV peak gradient, 2.3 mmHg. The mean mitral valve gradient is 1.0 mmHg. Tricuspid Valve: The tricuspid valve is grossly normal. Tricuspid valve regurgitation is mild. Aortic Valve: The aortic valve is tricuspid. There is mild to moderate aortic valve annular calcification. Aortic valve regurgitation is not visualized. Aortic valve sclerosis/calcification is present, without any  evidence of aortic stenosis. Aortic valve mean gradient measures 3.0 mmHg. Aortic valve peak gradient measures 6.2 mmHg. Aortic valve area, by VTI measures 2.23 cm. Pulmonic Valve: The pulmonic valve was grossly normal. Pulmonic valve regurgitation is trivial. Aorta: The aortic root is normal in size and structure. Venous: The inferior vena cava is normal in size with greater than 50% respiratory variability, suggesting right atrial pressure of 3 mmHg. IAS/Shunts: No atrial level shunt detected by color flow Doppler.  LEFT VENTRICLE PLAX 2D LVIDd:         4.90 cm     Diastology LVIDs:         4.00 cm     LV e' medial:    8.19 cm/s LV PW:         1.20 cm     LV E/e' medial:  8.0 LV IVS:        1.20 cm     LV e' lateral:   10.20 cm/s LVOT diam:     2.00 cm     LV E/e' lateral: 6.4 LV SV:         67 LV SV Index:   40 LVOT Area:     3.14 cm  LV Volumes (MOD) LV vol d, MOD A2C: 72.5 ml LV vol d, MOD A4C: 76.5 ml LV vol s, MOD A2C: 47.3 ml LV vol s, MOD A4C: 49.8 ml LV SV MOD A2C:     25.2 ml LV SV MOD A4C:     76.5 ml LV SV MOD BP:      26.9 ml RIGHT VENTRICLE RV Basal diam:  3.60 cm RV Mid diam:    2.50 cm RV S prime:     12.30 cm/s TAPSE (M-mode): 2.4 cm LEFT ATRIUM             Index        RIGHT ATRIUM           Index LA diam:        4.00 cm 2.41 cm/m   RA Area:     19.70 cm LA Vol (A2C):   60.4 ml 36.35 ml/m  RA Volume:   60.90 ml  36.65 ml/m LA Vol (A4C):   64.3 ml 38.70 ml/m LA Biplane Vol: 62.1 ml 37.37 ml/m  AORTIC VALVE                    PULMONIC VALVE AV Area (Vmax):    2.17 cm     PV Vmax:       0.64 m/s AV Area  (Vmean):   2.21 cm     PV Peak grad:  1.6 mmHg AV Area (VTI):     2.23 cm AV Vmax:           124.00 cm/s AV Vmean:          75.600 cm/s AV VTI:            0.298 m AV Peak Grad:      6.2 mmHg AV Mean Grad:      3.0 mmHg LVOT Vmax:         85.80 cm/s LVOT Vmean:        53.300 cm/s LVOT VTI:          0.212 m LVOT/AV VTI ratio: 0.71  AORTA Ao Root diam: 3.40 cm MITRAL VALVE               TRICUSPID VALVE MV Area (PHT): 3.08 cm  TR Peak grad:   41.5 mmHg MV Area VTI:   2.57 cm    TR Vmax:        322.00 cm/s MV Peak grad:  2.3 mmHg MV Mean grad:  1.0 mmHg    SHUNTS MV Vmax:       0.75 m/s    Systemic VTI:  0.21 m MV Vmean:      31.2 cm/s   Systemic Diam: 2.00 cm MV Decel Time: 246 msec MV E velocity: 65.60 cm/s MV A velocity: 20.20 cm/s MV E/A ratio:  3.25 Rozann Lesches MD Electronically signed by Rozann Lesches MD Signature Date/Time: 06/02/2022/11:04:26 AM    Final    CT ANGIO NECK W OR WO CONTRAST  Result Date: 06/01/2022 CLINICAL DATA:  Dizziness and stuttering for 3 days EXAM: CT ANGIOGRAPHY NECK TECHNIQUE: Multidetector CT imaging of the neck was performed using the standard protocol during bolus administration of intravenous contrast. Multiplanar CT image reconstructions and MIPs were obtained to evaluate the vascular anatomy. Carotid stenosis measurements (when applicable) are obtained utilizing NASCET criteria, using the distal internal carotid diameter as the denominator. RADIATION DOSE REDUCTION: This exam was performed according to the departmental dose-optimization program which includes automated exposure control, adjustment of the mA and/or kV according to patient size and/or use of iterative reconstruction technique. CONTRAST:  53m OMNIPAQUE IOHEXOL 350 MG/ML SOLN COMPARISON:  03/07/2022 CTA head and neck FINDINGS: Aortic arch: Standard branching. Imaged portion shows no evidence of aneurysm or dissection. No significant stenosis of the major arch vessel origins. Aortic atherosclerosis. Right  carotid system: Redemonstrated occlusion of the right ICA at its origin,, similar to the prior exam. Left carotid system: Multifocal atherosclerosis in the left common and internal carotid artery, without hemodynamically significant stenosis, dissection or, or occlusion. Vertebral arteries: Scattered atherosclerotic calcifications, without hemodynamically significant stenosis, dissection, or occlusion. Skeleton: No acute osseous abnormality. Degenerative changes in the cervical spine. Other neck: Negative. Upper chest: Emphysema.  Right apical bullae.  No pleural effusion. IMPRESSION: 1. 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 2. No other hemodynamically significant stenosis in the neck. 3. Aortic atherosclerosis. Aortic Atherosclerosis (ICD10-I70.0). Electronically Signed   By: AMerilyn BabaM.D.   On: 06/01/2022 21:42   MR Brain W and Wo Contrast  Result Date: 06/01/2022 CLINICAL DATA:  Stroke follow-up. Altered mental status, abnormal head CT. EXAM: MRI HEAD WITHOUT AND WITH CONTRAST MRA HEAD WITHOUT CONTRAST TECHNIQUE: Multiplanar, multi-echo pulse sequences of the brain and surrounding structures were acquired without and with intravenous contrast. Angiographic images of the Circle of Willis were acquired using MRA technique without intravenous contrast. CONTRAST:  720mGADAVIST GADOBUTROL 1 MMOL/ML IV SOLN COMPARISON:  Same-day head CT, brain MRI 03/08/2022, CTA head/neck 03/08/2019 FINDINGS: MRI HEAD FINDINGS Brain: There is diffusion restriction with associated T2/FLAIR signal abnormality in the left lentiform nucleus extending to the caudate body as well as in the right aspect of the pons consistent with acute to subacute infarcts. There is no associated hemorrhage or mass effect. There is no other evidence of acute infarct There is no acute intracranial hemorrhage or extra-axial fluid collection. Parenchymal volume is stable. The ventricles are stable in size compared to the prior brain  MRI. 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 There is no mass lesion. There is no abnormal enhancement. There is no mass effect or  midline shift. Vascular: The right ICA flow void remains abnormal, unchanged. The vasculature is assessed in full below. Skull and upper cervical spine: Normal marrow signal. Sinuses/Orbits: The paranasal sinuses are clear. The globes and orbits are unremarkable. Other: None. MRA HEAD FINDINGS Anterior circulation: There is unchanged occlusion of the right ICA throughout its imaged course with reconstitution of flow in the communicating segment. There is atherosclerotic irregularity of the left intracranial ICA with up to severe narrowing, similar to the prior study. There is focal moderate to severe stenosis of the distal right M1 segment (1009-8). The MCAs are otherwise patent, without other proximal high-grade stenosis or occlusion. The bilateral ACAs are patent, with overall mild multifocal atherosclerotic irregularity and narrowing but no high-grade stenosis or occlusion. There is no aneurysm or AVM. Posterior circulation: The bilateral V4 segments are patent to the level imaged. The imaged cerebellar arteries appear patent. The bilateral PCAs are patent proximally. There is short-segment severe stenosis of the left P2/P3 segment (1027-14). Bilateral posterior communicating arteries are identified. There is no aneurysm or AVM. Anatomic variants: None. IMPRESSION: 1. 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 2. 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 3. Unchanged occlusion of the right ICA from the neck through the communicating segment where there is  reconstitution via collateral flow. 4. 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 5. Focal moderate to severe stenosis of the distal right M1 segment and short-segment severe stenosis of the left P2/P3 segment. Electronically Signed   By: Valetta Mole M.D.   On: 06/01/2022 16:10   MR ANGIO HEAD WO CONTRAST  Result Date: 06/01/2022 CLINICAL DATA:  Stroke follow-up. Altered mental status, abnormal head CT. EXAM: MRI HEAD WITHOUT AND WITH CONTRAST MRA HEAD WITHOUT CONTRAST TECHNIQUE: Multiplanar, multi-echo pulse sequences of the brain and surrounding structures were acquired without and with intravenous contrast. Angiographic images of the Circle of Willis were acquired using MRA technique without intravenous contrast. CONTRAST:  77m GADAVIST GADOBUTROL 1 MMOL/ML IV SOLN COMPARISON:  Same-day head CT, brain MRI 03/08/2022, CTA head/neck 03/08/2019 FINDINGS: MRI HEAD FINDINGS Brain: There is diffusion restriction with associated T2/FLAIR signal abnormality in the left lentiform nucleus extending to the caudate body as well as in the right aspect of the pons consistent with acute to subacute infarcts. There is no associated hemorrhage or mass effect. There is no other evidence of acute infarct There is no acute intracranial hemorrhage or extra-axial fluid collection. Parenchymal volume is stable. The ventricles are stable in size compared to the prior brain MRI. 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 There is no mass lesion. There is no abnormal enhancement. There is no mass effect or midline shift. Vascular: The right ICA flow void remains abnormal, unchanged. The vasculature is assessed in full below. Skull and upper cervical spine: Normal marrow signal. Sinuses/Orbits: The paranasal sinuses are clear. The globes  and orbits are unremarkable. Other: None. MRA HEAD FINDINGS Anterior circulation: There is unchanged occlusion of the right ICA throughout its imaged course with reconstitution of flow in the communicating segment. There is atherosclerotic irregularity of the left intracranial ICA with up to severe narrowing, similar to the prior study. There is focal moderate to severe stenosis of the distal right M1 segment (  1009-8). The MCAs are otherwise patent, without other proximal high-grade stenosis or occlusion. The bilateral ACAs are patent, with overall mild multifocal atherosclerotic irregularity and narrowing but no high-grade stenosis or occlusion. There is no aneurysm or AVM. Posterior circulation: The bilateral V4 segments are patent to the level imaged. The imaged cerebellar arteries appear patent. The bilateral PCAs are patent proximally. There is short-segment severe stenosis of the left P2/P3 segment (1027-14). Bilateral posterior communicating arteries are identified. There is no aneurysm or AVM. Anatomic variants: None. IMPRESSION: 1. 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 2. 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 3. 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 4. 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 5. Focal moderate to severe stenosis of the distal right M1 segment and short-segment severe stenosis of the left P2/P3 segment. Electronically Signed   By: Valetta Mole M.D.   On: 06/01/2022 16:10   CT Head Wo Contrast  Result Date: 06/01/2022 CLINICAL DATA:  Altered mental status, dizziness EXAM: CT HEAD WITHOUT CONTRAST TECHNIQUE: Contiguous axial images were obtained from the base of the skull through the vertex without intravenous contrast. RADIATION  DOSE REDUCTION: This exam was performed according to the departmental dose-optimization program which includes automated exposure control, adjustment of the mA and/or kV according to patient size and/or use of iterative reconstruction technique. COMPARISON:  03/07/2022 FINDINGS: Brain: There are no signs of bleeding within the cranium. There is 1.5 cm area of new low-attenuation in the left basal ganglia. Cortical sulci are prominent. Vascular: Unremarkable. Skull: Unremarkable. Sinuses/Orbits: Unremarkable. Other: None. IMPRESSION: There are no signs of bleeding within the cranium. There is new 1.5 cm low-density in left basal ganglia suggesting acute/subacute or old infarct. If clinically warranted, follow-up MRI may be considered. Electronically Signed   By: Elmer Picker M.D.   On: 06/01/2022 14:37   DG Chest 1 View  Result Date: 06/01/2022 CLINICAL DATA:  Dizziness for 3 days with shortness of breath. EXAM: CHEST  1 VIEW COMPARISON:  Radiographs 02/04/2019 and 01/16/2019.  CT 01/10/2018. FINDINGS: 1345 hours. The heart size and mediastinal contours are stable. Asymmetric bullous emphysematous changes at the right lung apex are again noted. There is no edema, confluent airspace opacity, pleural effusion or pneumothorax. No acute osseous findings are evident. There is an old fracture of the mid right clavicle. IMPRESSION: No evidence of acute cardiopulmonary process. Chronic obstructive pulmonary disease. Electronically Signed   By: Richardean Sale M.D.   On: 06/01/2022 13:52     Scheduled Meds:  albuterol  2.5 mg Nebulization TID   aspirin EC  81 mg Oral Daily   atorvastatin  40 mg Oral QHS   clopidogrel  75 mg Oral Daily   gabapentin  600 mg Oral TID   heparin  5,000 Units Subcutaneous Q8H   latanoprost  1 drop Right Eye QHS   melatonin  3 mg Oral QHS   nicotine  21 mg Transdermal Daily   pantoprazole  40 mg Oral Daily   Continuous Infusions:  sodium chloride Stopped (06/01/22 2117)      LOS: 1 day    Roxan Hockey M.D on 06/02/2022 at 5:11 PM  Go to [email or web address] - for contact info  [facility] - Office  [phone]  If 7PM-7AM, please contact night-coverage [email or web address] 06/02/2022, 5:11 PM

## 2022-06-02 NOTE — NC FL2 (Signed)
[place] LEVEL OF CARE SCREENING TOOL     IDENTIFICATION  Patient Name: Timothy Galvan Birthdate: 1956-04-19 Sex: male Admission Date (Current Location): 06/01/2022  [facility] and [place] Number:  [employer] and Address:  [place] [address], [place]      Provider Number: [id number]  Attending Physician Name and Address:  Roxan Hockey, MD  Relative Name and Phone Number:       Current Level of Care: Hospital Recommended Level of Care: [place] Prior Approval Number:    Date Approved/Denied:   PASRR Number: [id number]  Discharge Plan: SNF    Current Diagnoses: Patient Active Problem List   Diagnosis Date Noted   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   PAD (peripheral artery disease) ([place]) 06/01/2022   Colon cancer ([place]) 06/01/2022   COPD (chronic obstructive pulmonary disease) ([place]) 06/01/2022   Blood clot in vein 03/08/2022   Cancer ([place]) 03/08/2022   Chest pain 01/16/2019   GERD (gastroesophageal reflux disease) 01/10/2018   CAD (coronary artery disease) 01/10/2018   Closed compression fracture of body of L1 vertebra ([place]) 01/10/2018   HLD (hyperlipidemia) 01/10/2018    Orientation RESPIRATION BLADDER Height & Weight     Self, Time, Situation, Place  Normal Incontinent Weight: 140 lb (63.5 kg) Height:  '5\' 3"'$  (160 cm)  BEHAVIORAL SYMPTOMS/MOOD NEUROLOGICAL BOWEL NUTRITION STATUS      Continent Diet (see dc summary)  AMBULATORY STATUS COMMUNICATION OF NEEDS Skin   Extensive Assist Verbally Normal                       Personal Care Assistance Level of Assistance  Bathing, Feeding, Dressing Bathing Assistance: Limited assistance Feeding assistance: Independent Dressing Assistance: Limited assistance     Functional Limitations Info  Sight, Hearing, Speech Sight Info: Adequate Hearing Info: Adequate Speech Info: Impaired    SPECIAL CARE FACTORS FREQUENCY  PT (By licensed  PT), OT (By licensed OT)     PT Frequency: 5x week OT Frequency: 3x week            Contractures Contractures Info: Not present    Additional Factors Info  Code Status, Allergies Code Status Info: Full Allergies Info: Penicillins           Current Medications (06/02/2022):  This is the current hospital active medication list Current Facility-Administered Medications  Medication Dose Route Frequency Provider Last Rate Last Admin   0.9 %  sodium chloride infusion   Intravenous Continuous Waldemar Dickens, MD   Stopped at 06/01/22 2117   acetaminophen (TYLENOL) tablet 650 mg  650 mg Oral Q4H PRN Waldemar Dickens, MD       Or   acetaminophen (TYLENOL) 160 MG/5ML solution 650 mg  650 mg Per Tube Q4H PRN Waldemar Dickens, MD       Or   acetaminophen (TYLENOL) suppository 650 mg  650 mg Rectal Q4H PRN Waldemar Dickens, MD       albuterol (PROVENTIL) (2.5 MG/3ML) 0.083% nebulizer solution 2.5 mg  2.5 mg Nebulization TID Waldemar Dickens, MD   2.5 mg at 06/02/22 0919   aspirin EC tablet 81 mg  81 mg Oral Daily [name], PA-C   81 mg at 06/02/22 0913   atorvastatin (LIPITOR) tablet 40 mg  40 mg Oral QHS Waldemar Dickens, MD   40 mg at 06/01/22 2151   clopidogrel (PLAVIX) tablet 75 mg  75 mg  Oral Daily [name], [place]   75 mg at 06/02/22 0913   gabapentin (NEURONTIN) capsule 600 mg  600 mg Oral TID Waldemar Dickens, MD   600 mg at 06/02/22 0913   heparin injection 5,000 Units  5,000 Units Subcutaneous Q8H Waldemar Dickens, MD   5,000 Units at 06/02/22 0509   HYDROcodone-acetaminophen (NORCO/VICODIN) 5-325 MG per tablet 1 tablet  1 tablet Oral Q6H PRN Waldemar Dickens, MD   1 tablet at 06/02/22 1034   ipratropium-albuterol (DUONEB) 0.5-2.5 (3) MG/3ML nebulizer solution 3 mL  3 mL Nebulization Q6H PRN Waldemar Dickens, MD       latanoprost (XALATAN) 0.005 % ophthalmic solution 1 drop  1 drop Right Eye QHS Waldemar Dickens, MD   1 drop at 06/01/22 2154   melatonin tablet  3 mg  3 mg Oral QHS Waldemar Dickens, MD   3 mg at 06/01/22 2151   nicotine (NICODERM CQ - dosed in mg/24 hours) patch 21 mg  21 mg Transdermal Daily Waldemar Dickens, MD   21 mg at 06/02/22 0915   ondansetron (ZOFRAN) tablet 4 mg  4 mg Oral Q6H PRN Waldemar Dickens, MD       Or   ondansetron [facility]) injection 4 mg  4 mg Intravenous Q6H PRN Waldemar Dickens, MD       pantoprazole (PROTONIX) EC tablet 40 mg  40 mg Oral Daily Waldemar Dickens, MD   40 mg at 06/02/22 0913   senna-docusate (Senokot-S) tablet 1 tablet  1 tablet Oral QHS PRN Waldemar Dickens, MD         Discharge Medications: Please see discharge summary for a list of discharge medications.  Relevant Imaging Results:  Relevant Lab Results:   Additional Information SSN: [id number] [address] [place]

## 2022-06-02 NOTE — TOC Initial Note (Signed)
Transition of Care [facility]) - Initial/Assessment Note    Patient Details  Name: Timothy Galvan MRN: [id number] Date of Birth: March 30, 1956  [facility] CM/SW Contact:    Shade Flood, LCSW Phone Number: 06/02/2022, 12:56 PM  Clinical Narrative:                  Pt admitted from home. PT/OT recommending CIR at dc. Spoke with pt and his daughter today to review dc planning. They are both agreeable to CIR if accepted and insurance approves. Discussed SNF referrals as back up plan in the event that pt is not a candidate for CIR or if insurance does not authorize. Pt and daughter agreeable to SNF backup plan referrals. CMS provider options reviewed and will refer to [facility] facilities at their request.  [facility] will follow.  Expected Discharge Plan: IP Rehab Facility Barriers to Discharge: Insurance Authorization   Patient Goals and CMS Choice Patient states their goals for this hospitalization and ongoing recovery are:: get better CMS Medicare.gov Compare Post Acute Care list provided to:: Patient Choice offered to / list presented to : Patient  Expected Discharge Plan and Services Expected Discharge Plan: [place] In-house Referral: Clinical Social Work   Post Acute Care Choice: IP Rehab Living arrangements for the past 2 months: Apartment                                      Prior Living Arrangements/Services Living arrangements for the past 2 months: Apartment Lives with:: Self Patient language and need for interpreter reviewed:: Yes Do you feel safe going back to the place where you live?: Yes      Need for Family Participation in Patient Care: No (Comment)     Criminal Activity/Legal Involvement Pertinent to Current Situation/Hospitalization: No - Comment as needed  Activities of Daily Living      Permission Sought/Granted Permission sought to share information with : Facility Art therapist granted  to share information with : Yes, Verbal Permission Granted     Permission granted to share info w AGENCY: CIR, SNF        Emotional Assessment Appearance:: Appears stated age Attitude/Demeanor/Rapport: Engaged Affect (typically observed): Pleasant Orientation: : Oriented to Self, Oriented to Place, Oriented to  Time, Oriented to Situation Alcohol / Substance Use: Not Applicable Psych Involvement: No (comment)  Admission diagnosis:  Stroke (cerebrum) (HCC) [I63.9] Cerebrovascular accident (CVA), unspecified mechanism ([place]) [I63.9] Patient Active Problem List   Diagnosis Date Noted   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   PAD (peripheral artery disease) ([place]) 06/01/2022   Colon cancer ([place]) 06/01/2022   COPD (chronic obstructive pulmonary disease) ([place]) 06/01/2022   Blood clot in vein 03/08/2022   Cancer ([place]) 03/08/2022   Chest pain 01/16/2019   GERD (gastroesophageal reflux disease) 01/10/2018   CAD (coronary artery disease) 01/10/2018   Closed compression fracture of body of L1 vertebra ([place]) 01/10/2018   HLD (hyperlipidemia) 01/10/2018   PCP:  [employer], [place]:   [id number] [place] [place]Phone: [phone]: [address] [address]Phone: [phone]: [address]Phone: [phone]: [phone]  Social Determinants of Health (SDOH) Interventions    Readmission Risk Interventions    06/02/2022   12:55 PM  Readmission Risk Prevention Plan  Medication Screening Complete  Transportation Screening Complete

## 2022-06-03 ENCOUNTER — Encounter (HOSPITAL_COMMUNITY): Payer: Self-pay | Admitting: Family Medicine

## 2022-06-03 DIAGNOSIS — K219 Gastro-esophageal reflux disease without esophagitis: Secondary | ICD-10-CM

## 2022-06-03 LAB — HEMOGLOBIN A1C
Hgb A1c MFr Bld: 6.3 % — ABNORMAL HIGH (ref 4.8–5.6)
Mean Plasma Glucose: 134 mg/dL

## 2022-06-03 MED ORDER — AMLODIPINE BESYLATE 5 MG PO TABS
5.0000 mg | ORAL_TABLET | Freq: Every day | ORAL | Status: DC
  Administered 2022-06-03 – 2022-06-05 (×3): 5 mg via ORAL
  Filled 2022-06-03 (×3): qty 1

## 2022-06-03 MED ORDER — LOSARTAN POTASSIUM 50 MG PO TABS
50.0000 mg | ORAL_TABLET | Freq: Every day | ORAL | Status: DC
  Administered 2022-06-03 – 2022-06-05 (×3): 50 mg via ORAL
  Filled 2022-06-03 (×3): qty 1

## 2022-06-03 NOTE — Progress Notes (Signed)
Inpatient Rehab Admissions Coordinator:    CIR following for potential admit; however, Pt. Is not yet medically ready at this time. I will follow for potential admit pending medical readiness.  Clemens Catholic, [place], [place] Admissions Coordinator  [phone] ([place]) [phone] (office)

## 2022-06-03 NOTE — PMR Pre-admission (Signed)
PMR Admission Coordinator Pre-Admission Assessment   Patient: Timothy Galvan is an 66 y.o., male MRN: [id number] DOB: 11/21/1955 Height: 5' 3" (1.6 m) Weight: 63.5 kg   Insurance Information HMO: yes    PPO:      PCP:      IPA:      80/20:      OTHER:  PRIMARY: [employer] Medicare      Policy#: [id number]      Subscriber: Pt CM Name:       Phone#: [phone]     Fax#: [phone] approved 11/21 for admit 11/22-12/5  Pre-Cert#: [id number]      Employer:  Benefits:  Phone #: portal     Name: Eff Date: 07/13/2021 - 07/12/2022 Deductible: does not have one OOP Max: $8,300 ($1,782.34 met) CIR: $1,556/ admission copay SNF: $0.00 Copayment per day for days 1-20; $200 Copayment per day for days  1-100; Maximum of 100 days/benefit period Outpatient: 80% coverage; 20% co-insurance Home Health:  100% coverage, limited by medical necessity DME: 80% coverage; 20% co-insurance   SECONDARY: Medicaid [place]       Policy#: [id number]       Phone#:    The "Data Collection Information Summary" for patients in Inpatient Rehabilitation Facilities with attached "Privacy Act Statement-Health Care Records" was provided and verbally reviewed with: Patient   Emergency Contact Information Contact Information       Name Relation Home Work Mobile    Ditmer,Betty Sister     [phone]    Beggs,[name] Daughter     [phone]    Foots,Jimmy Brother     [phone]           Current Medical History  Patient Admitting Diagnosis: CVA History of Present Illness: Claudie W Galvan is a 66 y.o. male with medical history significant of Asthma, CAD, GERD, HTN, Colon cancer, PAD, and Cocaine use who presented to the [facility] 06/01/22. Pt presented after 3 days of dizziness, and stuttering. Reported acute onset and feeling off balance w/ movement. Difficulty ambulating for 2 days PTA . CT head without contrast 06/01/2022 showed no signs of bleeding within the cranium, but a new 1.5 cm low-density in left basal  ganglia suggesting acute/subacute or old infarct. 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 No other hemodynamically significant stenosis in the neck. Aortic atherosclerosis. MRI brain with and without contrast 06/01/2022 revealing for acute to subacute infarcts in the left basal ganglia and right pons without hemorrhage or mass effect. 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 MRA of the head without contrast 06/01/2022 showed  unchanged occlusion of the right ICA from the neck through the communicating segment where there is reconstitution via collateral flow. 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 Focal moderate to severe stenosis of the distal right M1 segment and short-segment severe stenosis of the left P2/P3 segment. Echo showed Left ventricular ejection fraction, by estimation, is 45 to 50%.Right ventricular systolic function is normal.  PT. Seen by PT/OT who recommended CIR to assist return to PLOF.    Complete NIHSS TOTAL: 4   Patient's medical record from [facility] has been reviewed by the rehabilitation admission coordinator and physician.   Past Medical History      Past Medical History:  Diagnosis Date   Asthma     Avascular necrosis of bone of hip (HCC)     Blood clot   in vein     Cancer (HCC)     Coronary artery disease     GERD (gastroesophageal reflux disease)     Heart disease     Hypertension     Pneumonia        Has the patient had major surgery during 100 days prior to admission? Yes   Family History   family history is not on file.   Current Medications   Current Facility-Administered Medications:    acetaminophen (TYLENOL) tablet 650 mg, 650 mg, Oral, Q4H PRN, 650 mg at 06/03/22 0832 **OR** acetaminophen (TYLENOL) 160 MG/5ML solution 650 mg, 650 mg, Per Tube, Q4H PRN **OR** acetaminophen (TYLENOL)  suppository 650 mg, 650 mg, Rectal, Q4H PRN, [name], [name], MD   albuterol (PROVENTIL) (2.5 MG/3ML) 0.083% nebulizer solution 2.5 mg, 2.5 mg, Nebulization, Q2H PRN, Emokpae, [name], MD   aspirin EC tablet 81 mg, 81 mg, Oral, Daily, [name], [name], PA-C, 81 mg at 06/03/22 0828   atorvastatin (LIPITOR) tablet 40 mg, 40 mg, Oral, QHS, [name], [name], MD, 40 mg at 06/02/22 2319   clopidogrel (PLAVIX) tablet 75 mg, 75 mg, Oral, Daily, [name], [name], PA-C, 75 mg at 06/03/22 0828   diazepam (VALIUM) tablet 2 mg, 2 mg, Oral, TID, Emokpae, [name], MD, 2 mg at 06/03/22 0828   folic acid (FOLVITE) tablet 1 mg, 1 mg, Oral, Daily, Emokpae, [name], MD, 1 mg at 06/03/22 0828   gabapentin (NEURONTIN) capsule 600 mg, 600 mg, Oral, TID, [name], [name], MD, 600 mg at 06/03/22 0829   heparin injection 5,000 Units, 5,000 Units, Subcutaneous, Q8H, [name], [name], MD, 5,000 Units at 06/03/22 0544   hydrALAZINE (APRESOLINE) injection 10 mg, 10 mg, Intravenous, Q6H PRN, Emokpae, [name], MD, 10 mg at 06/02/22 1845   HYDROcodone-acetaminophen (NORCO/VICODIN) 5-325 MG per tablet 1 tablet, 1 tablet, Oral, Q6H PRN, [name], [name], MD, 1 tablet at 06/02/22 1034   latanoprost (XALATAN) 0.005 % ophthalmic solution 1 drop, 1 drop, Right Eye, QHS, [name], [name], MD, 1 drop at 06/02/22 2317   LORazepam (ATIVAN) tablet 1-4 mg, 1-4 mg, Oral, Q1H PRN **OR** LORazepam (ATIVAN) injection 1-4 mg, 1-4 mg, Intravenous, Q1H PRN, Emokpae, [name], MD, 2 mg at 06/02/22 1811   melatonin tablet 3 mg, 3 mg, Oral, QHS, [name], [name], MD, 3 mg at 06/02/22 2317   multivitamin with minerals tablet 1 tablet, 1 tablet, Oral, Daily, Emokpae, [name], MD, 1 tablet at 06/03/22 0828   nicotine (NICODERM CQ - dosed in mg/24 hours) patch 21 mg, 21 mg, Transdermal, Daily, [name], [name], MD, 21 mg at 06/03/22 0830   ondansetron (ZOFRAN) tablet 4 mg, 4 mg, Oral, Q6H PRN **OR** ondansetron (ZOFRAN) injection 4 mg, 4 mg,  Intravenous, Q6H PRN, [name], [name], MD   pantoprazole (PROTONIX) EC tablet 40 mg, 40 mg, Oral, Daily, [name], [name], MD, 40 mg at 06/03/22 0828   senna-docusate (Senokot-S) tablet 1 tablet, 1 tablet, Oral, QHS PRN, [name], [name], MD   thiamine (VITAMIN B1) tablet 100 mg, 100 mg, Oral, Daily, 100 mg at 06/03/22 0828 **OR** thiamine (VITAMIN B1) injection 100 mg, 100 mg, Intravenous, Daily, Emokpae, [name], MD   umeclidinium bromide (INCRUSE ELLIPTA) 62.5 MCG/ACT 1 puff, 1 puff, Inhalation, Daily, Emokpae, [name], MD, 1 puff at 06/03/22 0802 Patients Current Diet:  Diet Order                  DIET DYS 3 Room service appropriate? Yes; Fluid consistency: Nectar   Thick  Diet effective now                         Precautions / Restrictions Precautions Precautions: Fall Restrictions Weight Bearing Restrictions: No    Has the patient had 2 or more falls or a fall with injury in the past year? No   Prior Activity Level Community (5-7x/wk): Pt. active in the community PTA   Prior Functional Level Self Care: Did the patient need help bathing, dressing, using the toilet or eating? Independent   Indoor Mobility: Did the patient need assistance with walking from room to room (with or without device)? Independent   Stairs: Did the patient need assistance with internal or external stairs (with or without device)? Independent   Functional Cognition: Did the patient need help planning regular tasks such as shopping or remembering to take medications? Independent   Patient Information Are you of Hispanic, Latino/a,or Spanish origin?: A. No, not of Hispanic, Latino/a, or Spanish origin What is your race?: B. Black or African American Do you need or want an interpreter to communicate with a doctor or health care staff?: 0. No   Patient's Response To:  Health Literacy and Transportation Is the patient able to respond to health literacy and transportation needs?: Yes Health Literacy  - How often do you need to have someone help you when you read instructions, pamphlets, or other written material from your doctor or pharmacy?: Never In the past 12 months, has lack of transportation kept you from medical appointments or from getting medications?: No In the past 12 months, has lack of transportation kept you from meetings, work, or from getting things needed for daily living?: No   Home Assistive Devices / Equipment Home Equipment: Rollator (4 wheels), Rolling Walker (2 wheels), Cane - single point, Grab bars - tub/shower, Shower seat   Prior Device Use: Indicate devices/aids used by the patient prior to current illness, exacerbation or injury? None of the above   Current Functional Level Cognition   Arousal/Alertness: Awake/alert Overall Cognitive Status: No family/caregiver present to determine baseline cognitive functioning Orientation Level: Oriented X4 Attention: Sustained Sustained Attention: Impaired Sustained Attention Impairment: Verbal basic, Functional basic Memory: Impaired Memory Impairment: Decreased short term memory, Decreased recall of new information, Retrieval deficit Decreased Short Term Memory: Verbal basic, Functional basic Awareness: Appears intact Problem Solving: Appears intact Behaviors: Perseveration Safety/Judgment: Appears intact    Extremity Assessment (includes Sensation/Coordination)   Upper Extremity Assessment: Generalized weakness  Lower Extremity Assessment: Defer to PT evaluation LLE Deficits / Details: grossly weaker on left (4-/5 MMT) in all major muscles group, diminshed sensation L L5 dermatome LLE Sensation: decreased light touch     ADLs   Overall ADL's : Needs assistance/impaired Eating/Feeding: Supervision/ safety, Sitting, Modified independent Grooming: Wash/dry hands, Wash/dry face, Min guard, Standing Grooming Details (indicate cue type and reason): Completed at sink Upper Body Bathing: Set up, Sitting Lower Body  Bathing: Min guard, Sitting/lateral leans Upper Body Dressing : Set up, Sitting Lower Body Dressing: Supervision/safety, Sitting/lateral leans Lower Body Dressing Details (indicate cue type and reason): Pt able to doff and don socks seated at EOB with extended time only to don L sock. Toilet Transfer: Minimal assistance, Rolling walker (2 wheels), Ambulation Toilet Transfer Details (indicate cue type and reason): Min A for balance in standing from low surfaces Toileting- Clothing Manipulation and Hygiene: Minimal assistance, Sitting/lateral lean, Sit to/from stand Toileting - Clothing Manipulation Details (indicate cue type and reason): Min   A with balance when standing to complete pericare and if he had to pull up pants/underwear Tub/ Shower Transfer: Minimal assistance, Ambulation, Rolling walker (2 wheels) Functional mobility during ADLs: Min guard, Rolling walker (2 wheels) General ADL Comments: Pt leans to the L when he walks, which he is able to report, requiring min guard to min A for ADL's assessed this session     Mobility   Overal bed mobility: Needs Assistance Bed Mobility: Supine to Sit Supine to sit: Supervision Sit to supine: Supervision General bed mobility comments: Slightly labored.     Transfers   Overall transfer level: Needs assistance Equipment used: Rolling walker (2 wheels) Transfers: Sit to/from Stand Sit to Stand: Min guard, Min assist Bed to/from chair/wheelchair/BSC transfer type:: Step pivot Step pivot transfers: Min guard General transfer comment: Pt needing min G to min A to boost from EOB. Pt able to step and pivot for transfer with min G assist. Continues to be unstable.     Ambulation / Gait / Stairs / Wheelchair Mobility   Ambulation/Gait Ambulation/Gait assistance: Min guard Gait Distance (Feet): 35 Feet Assistive device: Rolling walker (2 wheels) Gait Pattern/deviations: Decreased step length - right, Decreased step length - left, Decreased  dorsiflexion - left, Decreased dorsiflexion - right, Decreased stride length General Gait Details: patient with slowed cadence, able to ambulate in hallway with min guard/RW, limited mostly due to fatigue and c/o dizziness Gait velocity: slow     Posture / Balance Dynamic Sitting Balance Sitting balance - Comments: seated EOB Balance Overall balance assessment: Needs assistance Sitting-balance support: No upper extremity supported, Feet supported Sitting balance-Leahy Scale: Good Sitting balance - Comments: seated EOB Standing balance support: Bilateral upper extremity supported, During functional activity, Reliant on assistive device for balance Standing balance-Leahy Scale: Fair Standing balance comment: fair to poor with RW, leaning to the L     Special needs/care consideration Skin intact  and Special service needs none    Previous Home Environment (from acute therapy documentation) Living Arrangements: Alone  Lives With: Alone Available Help at Discharge: Family, Available PRN/intermittently Type of Home: Apartment Home Layout: One level Home Access: Level entry Bathroom Shower/Tub: Tub/shower unit Bathroom Toilet: Standard Bathroom Accessibility: Yes How Accessible: Accessible via walker Home Care Services: No   Discharge Living Setting Plans for Discharge Living Setting: Patient's home Type of Home at Discharge: House Discharge Home Layout: One level Discharge Home Access: Level entry Discharge Bathroom Shower/Tub: Tub/shower unit Discharge Bathroom Toilet: Standard Discharge Bathroom Accessibility: Yes How Accessible: Accessible via walker   Social/Family/Support Systems Patient Roles: Other (Comment) Contact Information: [phone] Anticipated Caregiver: Dana (daughter) is pulling together plan for Pt.'s sisters, nephew to rotate care Ability/Limitations of Caregiver: Min A Caregiver Availability: 24/7 Discharge Plan Discussed with Primary Caregiver: Yes Is  Caregiver In Agreement with Plan?: Yes Does Caregiver/Family have Issues with Lodging/Transportation while Pt is in Rehab?: No   Goals Patient/Family Goal for Rehab: PT/OT/SLP Expected length of stay: 5-7 days Pt/Family Agrees to Admission and willing to participate: Yes Program Orientation Provided & Reviewed with Pt/Caregiver Including Roles  & Responsibilities: No   Decrease burden of Care through IP rehab admission: not anticipated   Possible need for SNF placement upon discharge: not anticipated   Patient Condition: I have reviewed medical records from [facility], spoken with CM, and patient and daughter. I met with patient at the bedside for inpatient rehabilitation assessment.  Patient will benefit from ongoing PT, OT, and SLP, can actively participate in   3 hours of therapy a day 5 days of the week, and can make measurable gains during the admission.  Patient will also benefit from the coordinated team approach during an Inpatient Acute Rehabilitation admission.  The patient will receive intensive therapy as well as Rehabilitation physician, nursing, social worker, and care management interventions.  Due to safety, skin/wound care, disease management, medication administration, pain management, and patient education the patient requires 24 hour a day rehabilitation nursing.  The patient is currently Min A-min g with mobility and basic ADLs.  Discharge setting and therapy post discharge at home with home health is anticipated.  Patient has agreed to participate in the Acute Inpatient Rehabilitation Program and will admit today.   Preadmission Screen Completed By:  Laura B Staley, 06/05/2022 10:42 AM ______________________________________________________________________   Discussed status with Dr. Doreatha Offer  on 06/05/22 at 930 and received approval for admission today.   Admission Coordinator:  Laura B Staley, CCC-SLP, time 1041/Date 06/05/22     Assessment/Plan: Diagnosis: Does the need for close, 24 hr/day Medical supervision in concert with the patient's rehab needs make it unreasonable for this patient to be served in a less intensive setting? Yes Co-Morbidities requiring supervision/potential complications: hemiparesis, aphasia, HFrEF, hypertension, PAD, polysubstance abuse w/ Hx withdrawals, COPD, electrolyte deficiencies. Due to safety, skin/wound care, disease management, medication administration, pain management, and patient education, does the patient require 24 hr/day rehab nursing? Yes Does the patient require coordinated care of a physician, rehab nurse, PT, OT, and SLP to address physical and functional deficits in the context of the above medical diagnosis(es)? Yes Addressing deficits in the following areas: balance, endurance, locomotion, strength, transferring, bathing, dressing, feeding, grooming, toileting, cognition, and psychosocial support Can the patient actively participate in an intensive therapy program of at least 3 hrs of therapy 5 days a week? Yes The potential for patient to make measurable gains while on inpatient rehab is good Anticipated functional outcomes upon discharge from inpatient rehab: supervision to Min A PT, supervision OT, supervision SLP Estimated rehab length of stay to reach the above functional goals is: 5-7 days Anticipated discharge destination: Home 10. Overall Rehab/Functional Prognosis: good     MD Signature:   Ason Heslin C Chazlyn Cude, DO 06/05/2022  

## 2022-06-03 NOTE — Progress Notes (Signed)
   06/03/22 0913  Vitals  BP (!) 159/76  MAP (mmHg) 102  BP Location Right Arm  BP Method Automatic  Pulse Rate 89  Pulse Rate Source Monitor  ECG Heart Rate 89  Resp 20  Level of Consciousness  Level of Consciousness Alert  MEWS COLOR  MEWS Score Color Green  Oxygen Therapy  SpO2 100 %  O2 Device Room Air  Pain Assessment  Pain Scale 0-10  Pain Score 0  MEWS Score  MEWS Temp 0  MEWS Systolic 0  MEWS Pulse 0  MEWS RR 0  MEWS LOC 0  MEWS Score 0   Report received that pt potentially aspirated on breakfast meal. MD Wynetta Emery in to evaluate pt. Currently pt alert, oriented, denies c/o. Pt states he got strangled while drinking his coffee. No SOB noted, occasional congested cough with phlegm, breath sounds diminished in bases, otherwise clear. VSS. Pt continues to finish his breakfast tray at this time without difficulty or further swallowing issues. Pt states he wants to go home.

## 2022-06-03 NOTE — Evaluation (Signed)
Clinical/Bedside Swallow Evaluation Patient Details  Name: Timothy Galvan MRN: [id number] Date of Birth: July 22, 1955  Today's Date: 06/03/2022 Time: SLP Start Time (ACUTE ONLY): 95 SLP Stop Time (ACUTE ONLY): 7846 SLP Time Calculation (min) (ACUTE ONLY): 25 min  Past Medical History:  Past Medical History:  Diagnosis Date   Asthma    Avascular necrosis of bone of hip ([place])    Blood clot in vein    Cancer ([place])    Coronary artery disease    GERD (gastroesophageal reflux disease)    Heart disease    Hypertension    Pneumonia    Past Surgical History:  Past Surgical History:  Procedure Laterality Date   FEMORAL BYPASS     MANDIBLE SURGERY     WRIST SURGERY     HPI:  Timothy Galvan is a 66 y.o. male with medical history significant of Asthma, CAD, GERD, HTN, Colon cancer, PAD, and Cocaine use.      Pt presenting after 3 days of dizziness, and stuttering. Acute onset. Feeling off balance w/ movement. Difficulty ambulating for past 2 days. Denies CP, SOB, DOE. Endorsing Wheezing offa and on lately. Pts son called EMS after pts stutterintg and dizziness did not resolve. Associated w/ increased fatigue over the last few days.      Level V caveat appies as pt unable to provide much history due to speech change from stroke. 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    Assessment / Plan / Recommendation  Clinical Impression  Pt seen at bedside for clinical swallow evaluation, as Pt had suspected choking episode with breakfast this AM. Pt was seen for cognitive linguistic evaluation yesterday. Pt reports that he recently had his upper teeth pulled and his gums are sore. Pt with lingual weakness and impaired mastication. He self presented cup sip thin water, which resulted in immediate cough. He is impulsive with intake, despite cues to take small sips. Coughing episodes  with thins are intermittent and inconsistent (he appeared to swallow his pills with straw sips thin without incident). Pt assessed with nectar thick juice and did not exhibit any coughing. Pt with min oral phase dysphagia and suspected pharyngeal involvement in setting of acute CVA and exacerbated by impulsivity and cognitive deficits. Recommend D3/mech soft and NTL and PO medication whole with NTL or in puree and SLP to follow during acute stay and at next venue for instrumental assessment as indicated. Above to RN. SLP Visit Diagnosis: Dysphagia, unspecified (R13.10)    Aspiration Risk  Mild aspiration risk    Diet Recommendation Dysphagia 3 (Mech soft);Nectar-thick liquid   Liquid Administration via: Cup;Straw Medication Administration: Whole meds with puree Supervision: Patient able to self feed;Full supervision/cueing for compensatory strategies Compensations: Slow rate;Small sips/bites Postural Changes: Seated upright at 90 degrees;Remain upright for at least 30 minutes after po intake    Other  Recommendations Oral Care Recommendations: Staff/trained caregiver to provide oral care;Oral care BID Other Recommendations: Order thickener from pharmacy;Prohibited food (jello, ice cream, thin soups);Clarify dietary restrictions    Recommendations for follow up therapy are one component of a multi-disciplinary discharge planning process, led by the attending physician.  Recommendations may be updated based on patient status, additional functional criteria and insurance authorization.  Follow up Recommendations Acute inpatient rehab (3hours/day)      Assistance Recommended at Discharge    Functional Status Assessment Patient has had a recent  decline in their functional status and demonstrates the ability to make significant improvements in function in a reasonable and predictable amount of time.  Frequency and Duration min 2x/week  1 week       Prognosis Prognosis for Safe Diet  Advancement: Fair Barriers to Reach Goals: Cognitive deficits      Swallow Study   General Date of Onset: 06/01/22 HPI: Timothy Galvan is a 66 y.o. male with medical history significant of Asthma, CAD, GERD, HTN, Colon cancer, PAD, and Cocaine use.      Pt presenting after 3 days of dizziness, and stuttering. Acute onset. Feeling off balance w/ movement. Difficulty ambulating for past 2 days. Denies CP, SOB, DOE. Endorsing Wheezing offa and on lately. Pts son called EMS after pts stutterintg and dizziness did not resolve. Associated w/ increased fatigue over the last few days.      Level V caveat appies as pt unable to provide much history due to speech change from stroke. 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 Type of Study: Bedside Swallow Evaluation Previous Swallow Assessment: N/A Diet Prior to this Study: Regular;Thin liquids Temperature Spikes Noted: No Respiratory Status: Room air History of Recent Intubation: No Behavior/Cognition: Alert;Cooperative;Pleasant mood Oral Cavity Assessment: Excessive secretions Oral Care Completed by SLP: Yes Oral Cavity - Dentition: Edentulous;Missing dentition;Poor condition Vision: Functional for self-feeding Self-Feeding Abilities: Able to feed self Patient Positioning: Upright in bed Baseline Vocal Quality: Normal Volitional Cough: Weak Volitional Swallow: Able to elicit    Oral/Motor/Sensory Function Overall Oral Motor/Sensory Function: Mild impairment Facial ROM: Within Functional Limits Facial Symmetry: Within Functional Limits Facial Strength: Within Functional Limits Facial Sensation: Reduced right Lingual Symmetry: Within Functional Limits Lingual Strength: Reduced Lingual Sensation: Within Functional Limits Velum: Within Functional Limits Mandible: Within Functional Limits   Ice Chips Ice chips: Within  functional limits Presentation: Spoon   Thin Liquid Thin Liquid: Impaired Presentation: Cup;Self Fed;Straw Oral Phase Impairments: Reduced lingual movement/coordination Pharyngeal  Phase Impairments: Suspected delayed Swallow;Cough - Immediate (suspected premature spillage)    Nectar Thick Nectar Thick Liquid: Within functional limits Presentation: Cup;Straw;Self Fed   Honey Thick Honey Thick Liquid: Not tested   Puree Puree: Within functional limits Presentation: Spoon   Solid     Solid: Impaired Presentation: Spoon Oral Phase Impairments: Reduced lingual movement/coordination;Impaired mastication Oral Phase Functional Implications: Impaired mastication;Prolonged oral transit;Oral residue Pharyngeal Phase Impairments: Cough - Delayed     Thank you,  Genene Churn, [place]  Wendelyn Kiesling 06/03/2022,1:13 PM

## 2022-06-03 NOTE — Progress Notes (Signed)
Patient was coughing and choking on grits Dr. Wynetta Emery aware, patients VSS at this time    06/03/22 0913  Vitals  BP (!) 159/76  MAP (mmHg) 102  BP Location Right Arm  BP Method Automatic  Pulse Rate 89  Pulse Rate Source Monitor  ECG Heart Rate 89  Resp 20  Level of Consciousness  Level of Consciousness Alert  MEWS COLOR  MEWS Score Color Green  Oxygen Therapy  SpO2 100 %  O2 Device Room Air  Pain Assessment  Pain Scale 0-10  Pain Score 0  MEWS Score  MEWS Temp 0  MEWS Systolic 0  MEWS Pulse 0  MEWS RR 0  MEWS LOC 0  MEWS Score 0

## 2022-06-03 NOTE — Progress Notes (Signed)
Timothy Galvan  WIO:[id number] DOB: 03-08-1956 DOA: 06/01/2022 PCP: [facility], [facility]   Chief Complaint  Patient presents with   Dizziness   Level of care: Telemetry  Brief Admission History:  No notes on file   Assessment and Plan:  Acute and subacute strokes with speech deficits and left-sided hemiparesis- -MRI/MRA consistent with Acute to Subacute infarct in L basal ganglia and R pons w/o hemorrhage, chronic small-vessel ischemic change and remote cortical infarct in the right frontoparietal region -expressive aphasia, left-sided hemiparesis especially of the left upper extremity, -Echo with EF of 45 to 50% with wall motion abnormalities, no aortic stenosis, mild pulmonary hypertension -EF is down from  55% back in 2022 -Total cholesterol 134, triglycerides 165, HDL 49, LDL 52---Even if his lipid panel is within desired limits, patient should still take Lipitor for it's Pleiotropic effects (beyond cholesterol lowering benefits) -A1c - 6.3%  -Neurologist Recs:  Aspirin 81 mg daily along with Plavix 75 mg daily for 90 days then after that STOP the Plavix  and continue ONLY Aspirin 81 mg daily indefinitely--for secondary stroke Prevention -30 day Event/Holter Monitor arranged with L [facility] who plans to mail to patient's home.  -PT/OT eval appreciated recommends -BP suboptimally controlled: started losartan 50 mg with amlodipine 5 mg daily.     HFrEF--- patient with history of cardiomyopathy in setting of alcohol and cocaine abuse -Previously followed by [facility] -Echo as noted above #1, additional echo findings include significant wall motion normalities as listed here-The antero-lateral wall, posterior wall, and basal inferior segment are hypokinetic. The entire anterior wall, entire septum, entire apex, and mid and distal inferior wall are normal.  - starting losartan 50 mg daily   Severe PAD  -CTA neck, and MRA angio head consistent with occlusion of the  right ICA at its origin.  -occlusion of the right ICA from the neck through the communicating segment where there is reconstitution via collateral flow. 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 -Focal moderate to severe stenosis of the distal right M1 segment and short-segment severe stenosis of the left P2/P3 segment. -Lipitor as above #1 -continue Aspirin 81 mg daily along with Plavix 75 mg daily for 90 days then after that STOP the Plavix  and continue ONLY Aspirin 81 mg daily indefinitely--for secondary stroke Prevention -Ambulatory referral placed to IR clinic as recommended  Polysubstance abuse--- mostly cocaine, tobacco and alcohol -UDS is again positive for cocaine -Abstinence from alcohol tobacco and cocaine strongly advised  HTN--- permissive hypertension initially - ok to normalize BP per neuro - added losartan 50 mg daily, amlodipine 5 mg daily   COPD/Asthma - no acute exacerbation at this time smoking cessation is advised   Tobacco abuse - nicotine patch ordered, strongly advised patient to stop all tobacco use  ETOH abuse - continue scheduled diazepam - continue CIWA protocol - CIWA low scores at this time; follow   GERD - continue protonix for GI protection    DVT prophylaxis: [place] heparin  Code Status: Full  Family Communication:  Disposition: Status is: Inpatient Remains inpatient appropriate because: awaiting CIR bed, anticipating on 11/24   Consultants:   Procedures:   Antimicrobials:    Subjective: Pt had a choking spell this morning when eating breakfast.  He was eating really fast.  He denies SOB.    Objective: Vitals:   06/03/22 0228 06/03/22 0545 06/03/22 0805 06/03/22 0913  BP: (!) 164/77 (!) 187/105  (!) 159/76  Pulse: 61 (!)  52  89  Resp: '20 18  20  '$ Temp: 97.7 F (36.5 C) 97.8 F (36.6 C)    TempSrc:      SpO2: 100% 100% 100% 100%  Weight:      Height:        Intake/Output Summary (Last 24 hours) at 06/03/2022  1211 Last data filed at 06/03/2022 0900 Gross per 24 hour  Intake 600 ml  Output 2200 ml  Net -1600 ml   Filed Weights   06/01/22 1251  Weight: 63.5 kg   Examination:  General exam: speech remains unintelligible.  Appears calm and comfortable  Respiratory system: Clear to auscultation. Respiratory effort normal. Cardiovascular system: normal S1 & S2 heard. No JVD, murmurs, rubs, gallops or clicks. No pedal edema. Gastrointestinal system: Abdomen is nondistended, soft and nontender. No organomegaly or masses felt. Normal bowel sounds heard. Central nervous system: Alert and oriented. Left hemiparesis noted. Extremities: Symmetric 5 x 5 power. Skin: No rashes, lesions or ulcers. Psychiatry: Judgement and insight appear poor. Mood & affect appropriate.   Data Reviewed: I have personally reviewed following labs and imaging studies  CBC: Recent Labs  Lab 06/01/22 1431  WBC 6.1  NEUTROABS 1.9  HGB 13.0  HCT 41.9  MCV 93.9  PLT 595    Basic Metabolic Panel: Recent Labs  Lab 06/01/22 1431  NA 143  K 3.3*  CL 114*  CO2 24  GLUCOSE 119*  BUN 13  CREATININE 0.90  CALCIUM 8.9    CBG: Recent Labs  Lab 06/01/22 1433  GLUCAP 128*    No results found for this or any previous visit (from the past 240 hour(s)).   Radiology Studies: ECHOCARDIOGRAM COMPLETE  Result Date: 06/02/2022    ECHOCARDIOGRAM REPORT   Patient Name:   Timothy Galvan Date of Exam: 06/02/2022 Medical Rec #:  [id number]       Height:       63.0 in Accession #:    [id number]      Weight:       140.0 lb Date of Birth:  [birth year]       BSA:          1.662 m Patient Age:    66 years        BP:           157/76 mmHg Patient Gender: M               HR:           54 bpm. Exam Location:  [facility] Procedure: 2D Echo, Cardiac Doppler and Color Doppler Indications:    Stroke  History:        Patient has no prior history of Echocardiogram examinations.                 CAD, Stroke, PAD and COPD,  Signs/Symptoms:Chest Pain; Risk                 Factors:Dyslipidemia. Colon CA.  Sonographer:    Wenda Low Referring Phys: Ashley  1. Left ventricular ejection fraction, by estimation, is 45 to 50%. The left ventricle has mildly decreased function. The left ventricle demonstrates regional wall motion abnormalities (see scoring diagram/findings for description). There is mild concentric left ventricular hypertrophy. Left ventricular diastolic parameters were normal.  2. Right ventricular systolic function is normal. The right ventricular size is normal. There is mildly elevated pulmonary artery systolic pressure. The estimated right ventricular systolic pressure is 44.5  mmHg.  3. Left atrial size was mildly dilated.  4. Right atrial size was mildly dilated.  5. The mitral valve is grossly normal. Mild mitral valve regurgitation.  6. The aortic valve is tricuspid. Aortic valve regurgitation is not visualized. Aortic valve sclerosis/calcification is present, without any evidence of aortic stenosis. Aortic valve mean gradient measures 3.0 mmHg.  7. The inferior vena cava is normal in size with greater than 50% respiratory variability, suggesting right atrial pressure of 3 mmHg. Comparison(s): Prior images unable to be directly viewed. Patient with reported history of cardiomyopathy followed at [facility] (fluctuating LVEF, 45% in 2020 and 55% in 2022). FINDINGS  Left Ventricle: Left ventricular ejection fraction, by estimation, is 45 to 50%. The left ventricle has mildly decreased function. The left ventricle demonstrates regional wall motion abnormalities. The left ventricular internal cavity size was normal in size. There is mild concentric left ventricular hypertrophy. Left ventricular diastolic parameters were normal.  LV Wall Scoring: The antero-lateral wall, posterior wall, and basal inferior segment are hypokinetic. The entire anterior wall, entire septum, entire apex, and mid and distal  inferior wall are normal. Right Ventricle: The right ventricular size is normal. No increase in right ventricular wall thickness. Right ventricular systolic function is normal. There is mildly elevated pulmonary artery systolic pressure. The tricuspid regurgitant velocity is 3.22  m/s, and with an assumed right atrial pressure of 3 mmHg, the estimated right ventricular systolic pressure is 34.1 mmHg. Left Atrium: Left atrial size was mildly dilated. Right Atrium: Right atrial size was mildly dilated. Prominent Chiari network. Pericardium: There is no evidence of pericardial effusion. Mitral Valve: The mitral valve is grossly normal. Mild mitral valve regurgitation. MV peak gradient, 2.3 mmHg. The mean mitral valve gradient is 1.0 mmHg. Tricuspid Valve: The tricuspid valve is grossly normal. Tricuspid valve regurgitation is mild. Aortic Valve: The aortic valve is tricuspid. There is mild to moderate aortic valve annular calcification. Aortic valve regurgitation is not visualized. Aortic valve sclerosis/calcification is present, without any evidence of aortic stenosis. Aortic valve mean gradient measures 3.0 mmHg. Aortic valve peak gradient measures 6.2 mmHg. Aortic valve area, by VTI measures 2.23 cm. Pulmonic Valve: The pulmonic valve was grossly normal. Pulmonic valve regurgitation is trivial. Aorta: The aortic root is normal in size and structure. Venous: The inferior vena cava is normal in size with greater than 50% respiratory variability, suggesting right atrial pressure of 3 mmHg. IAS/Shunts: No atrial level shunt detected by color flow Doppler.  LEFT VENTRICLE PLAX 2D LVIDd:         4.90 cm     Diastology LVIDs:         4.00 cm     LV e' medial:    8.19 cm/s LV PW:         1.20 cm     LV E/e' medial:  8.0 LV IVS:        1.20 cm     LV e' lateral:   10.20 cm/s LVOT diam:     2.00 cm     LV E/e' lateral: 6.4 LV SV:         67 LV SV Index:   40 LVOT Area:     3.14 cm  LV Volumes (MOD) LV vol d, MOD A2C: 72.5  ml LV vol d, MOD A4C: 76.5 ml LV vol s, MOD A2C: 47.3 ml LV vol s, MOD A4C: 49.8 ml LV SV MOD A2C:     25.2 ml LV SV MOD A4C:  76.5 ml LV SV MOD BP:      26.9 ml RIGHT VENTRICLE RV Basal diam:  3.60 cm RV Mid diam:    2.50 cm RV S prime:     12.30 cm/s TAPSE (M-mode): 2.4 cm LEFT ATRIUM             Index        RIGHT ATRIUM           Index LA diam:        4.00 cm 2.41 cm/m   RA Area:     19.70 cm LA Vol (A2C):   60.4 ml 36.35 ml/m  RA Volume:   60.90 ml  36.65 ml/m LA Vol (A4C):   64.3 ml 38.70 ml/m LA Biplane Vol: 62.1 ml 37.37 ml/m  AORTIC VALVE                    PULMONIC VALVE AV Area (Vmax):    2.17 cm     PV Vmax:       0.64 m/s AV Area (Vmean):   2.21 cm     PV Peak grad:  1.6 mmHg AV Area (VTI):     2.23 cm AV Vmax:           124.00 cm/s AV Vmean:          75.600 cm/s AV VTI:            0.298 m AV Peak Grad:      6.2 mmHg AV Mean Grad:      3.0 mmHg LVOT Vmax:         85.80 cm/s LVOT Vmean:        53.300 cm/s LVOT VTI:          0.212 m LVOT/AV VTI ratio: 0.71  AORTA Ao Root diam: 3.40 cm MITRAL VALVE               TRICUSPID VALVE MV Area (PHT): 3.08 cm    TR Peak grad:   41.5 mmHg MV Area VTI:   2.57 cm    TR Vmax:        322.00 cm/s MV Peak grad:  2.3 mmHg MV Mean grad:  1.0 mmHg    SHUNTS MV Vmax:       0.75 m/s    Systemic VTI:  0.21 m MV Vmean:      31.2 cm/s   Systemic Diam: 2.00 cm MV Decel Time: 246 msec MV E velocity: 65.60 cm/s MV A velocity: 20.20 cm/s MV E/A ratio:  3.25 Rozann Lesches MD Electronically signed by Rozann Lesches MD Signature Date/Time: 06/02/2022/11:04:26 AM    Final    CT ANGIO NECK W OR WO CONTRAST  Result Date: 06/01/2022 CLINICAL DATA:  Dizziness and stuttering for 3 days EXAM: CT ANGIOGRAPHY NECK TECHNIQUE: Multidetector CT imaging of the neck was performed using the standard protocol during bolus administration of intravenous contrast. Multiplanar CT image reconstructions and MIPs were obtained to evaluate the vascular anatomy. Carotid stenosis  measurements (when applicable) are obtained utilizing NASCET criteria, using the distal internal carotid diameter as the denominator. RADIATION DOSE REDUCTION: This exam was performed according to the departmental dose-optimization program which includes automated exposure control, adjustment of the mA and/or kV according to patient size and/or use of iterative reconstruction technique. CONTRAST:  45m OMNIPAQUE IOHEXOL 350 MG/ML SOLN COMPARISON:  03/07/2022 CTA head and neck FINDINGS: Aortic arch: Standard branching. Imaged portion shows no evidence of aneurysm or dissection. No significant  stenosis of the major arch vessel origins. Aortic atherosclerosis. 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 Left carotid system: Multifocal atherosclerosis in the left common and internal carotid artery, without hemodynamically significant stenosis, dissection or, or occlusion. Vertebral arteries: Scattered atherosclerotic calcifications, without hemodynamically significant stenosis, dissection, or occlusion. Skeleton: No acute osseous abnormality. Degenerative changes in the cervical spine. Other neck: Negative. Upper chest: Emphysema.  Right apical bullae.  No pleural effusion. IMPRESSION: 1. 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 2. No other hemodynamically significant stenosis in the neck. 3. Aortic atherosclerosis. Aortic Atherosclerosis (ICD10-I70.0). Electronically Signed   By: Merilyn Baba M.D.   On: 06/01/2022 21:42   MR Brain W and Wo Contrast  Result Date: 06/01/2022 CLINICAL DATA:  Stroke follow-up. Altered mental status, abnormal head CT. EXAM: MRI HEAD WITHOUT AND WITH CONTRAST MRA HEAD WITHOUT CONTRAST TECHNIQUE: Multiplanar, multi-echo pulse sequences of the brain and surrounding structures were acquired without and with intravenous contrast. Angiographic images of the Circle of Willis were acquired using MRA technique without  intravenous contrast. CONTRAST:  58m GADAVIST GADOBUTROL 1 MMOL/ML IV SOLN COMPARISON:  Same-day head CT, brain MRI 03/08/2022, CTA head/neck 03/08/2019 FINDINGS: MRI HEAD FINDINGS Brain: There is diffusion restriction with associated T2/FLAIR signal abnormality in the left lentiform nucleus extending to the caudate body as well as in the right aspect of the pons consistent with acute to subacute infarcts. There is no associated hemorrhage or mass effect. There is no other evidence of acute infarct There is no acute intracranial hemorrhage or extra-axial fluid collection. Parenchymal volume is stable. The ventricles are stable in size compared to the prior brain MRI. 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 There is no mass lesion. There is no abnormal enhancement. There is no mass effect or midline shift. Vascular: The right ICA flow void remains abnormal, unchanged. The vasculature is assessed in full below. Skull and upper cervical spine: Normal marrow signal. Sinuses/Orbits: The paranasal sinuses are clear. The globes and orbits are unremarkable. Other: None. MRA HEAD FINDINGS Anterior circulation: There is unchanged occlusion of the right ICA throughout its imaged course with reconstitution of flow in the communicating segment. There is atherosclerotic irregularity of the left intracranial ICA with up to severe narrowing, similar to the prior study. There is focal moderate to severe stenosis of the distal right M1 segment (1009-8). The MCAs are otherwise patent, without other proximal high-grade stenosis or occlusion. The bilateral ACAs are patent, with overall mild multifocal atherosclerotic irregularity and narrowing but no high-grade stenosis or occlusion. There is no aneurysm or AVM. Posterior circulation: The bilateral V4  segments are patent to the level imaged. The imaged cerebellar arteries appear patent. The bilateral PCAs are patent proximally. There is short-segment severe stenosis of the left P2/P3 segment (1027-14). Bilateral posterior communicating arteries are identified. There is no aneurysm or AVM. Anatomic variants: None. IMPRESSION: 1. 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 2. 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 3. 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 4. 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 5. Focal moderate to severe stenosis of  the distal right M1 segment and short-segment severe stenosis of the left P2/P3 segment. Electronically Signed   By: Valetta Mole M.D.   On: 06/01/2022 16:10   MR ANGIO HEAD WO CONTRAST  Result Date: 06/01/2022 CLINICAL DATA:  Stroke follow-up. Altered mental status, abnormal head CT. EXAM: MRI HEAD WITHOUT AND WITH CONTRAST MRA HEAD WITHOUT CONTRAST TECHNIQUE: Multiplanar, multi-echo pulse sequences of the brain and surrounding structures were acquired without and with intravenous contrast. Angiographic images of the Circle of Willis were acquired using MRA technique without intravenous contrast. CONTRAST:  66m GADAVIST GADOBUTROL 1 MMOL/ML IV SOLN COMPARISON:  Same-day head CT, brain MRI 03/08/2022, CTA head/neck 03/08/2019 FINDINGS: MRI HEAD FINDINGS Brain: There is diffusion restriction with associated T2/FLAIR signal abnormality in the left lentiform nucleus extending to the caudate body as well as in the right aspect of the pons consistent with acute to subacute infarcts. There is no associated hemorrhage or mass effect. There is no other evidence of acute infarct There is no acute intracranial hemorrhage or extra-axial  fluid collection. Parenchymal volume is stable. The ventricles are stable in size compared to the prior brain MRI. 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 There is no mass lesion. There is no abnormal enhancement. There is no mass effect or midline shift. Vascular: The right ICA flow void remains abnormal, unchanged. The vasculature is assessed in full below. Skull and upper cervical spine: Normal marrow signal. Sinuses/Orbits: The paranasal sinuses are clear. The globes and orbits are unremarkable. Other: None. MRA HEAD FINDINGS Anterior circulation: There is unchanged occlusion of the right ICA throughout its imaged course with reconstitution of flow in the communicating segment. There is atherosclerotic irregularity of the left intracranial ICA with up to severe narrowing, similar to the prior study. There is focal moderate to severe stenosis of the distal right M1 segment (1009-8). The MCAs are otherwise patent, without other proximal high-grade stenosis or occlusion. The bilateral ACAs are patent, with overall mild multifocal atherosclerotic irregularity and narrowing but no high-grade stenosis or occlusion. There is no aneurysm or AVM. Posterior circulation: The bilateral V4 segments are patent to the level imaged. The imaged cerebellar arteries appear patent. The bilateral PCAs are patent proximally. There is short-segment severe stenosis of the left P2/P3 segment (1027-14). Bilateral posterior communicating arteries are identified. There is no aneurysm or AVM. Anatomic variants: None. IMPRESSION: 1. 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 2. 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 3.  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 4. 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 5. Focal moderate to severe stenosis of the distal right M1 segment and short-segment severe stenosis of the left P2/P3 segment. Electronically Signed   By: PValetta MoleM.D.   On: 06/01/2022 16:10   CT Head Wo Contrast  Result Date: 06/01/2022 CLINICAL DATA:  Altered mental status, dizziness EXAM: CT HEAD WITHOUT CONTRAST TECHNIQUE: Contiguous axial images were obtained from the base of the skull through the vertex without intravenous contrast. RADIATION DOSE REDUCTION: This exam was performed according to the departmental dose-optimization program which includes automated exposure control, adjustment of the mA and/or kV according to patient size and/or  use of iterative reconstruction technique. COMPARISON:  03/07/2022 FINDINGS: Brain: There are no signs of bleeding within the cranium. There is 1.5 cm area of new low-attenuation in the left basal ganglia. Cortical sulci are prominent. Vascular: Unremarkable. Skull: Unremarkable. Sinuses/Orbits: Unremarkable. Other: None. IMPRESSION: There are no signs of bleeding within the cranium. There is new 1.5 cm low-density in left basal ganglia suggesting acute/subacute or old infarct. If clinically warranted, follow-up MRI may be considered. Electronically Signed   By: Elmer Picker M.D.   On: 06/01/2022 14:37   DG Chest 1 View  Result Date: 06/01/2022 CLINICAL DATA:  Dizziness for 3 days with shortness of breath. EXAM: CHEST  1 VIEW COMPARISON:  Radiographs 02/04/2019 and 01/16/2019.  CT 01/10/2018. FINDINGS: 1345 hours. The heart size and mediastinal contours are stable. Asymmetric bullous emphysematous changes at the right lung apex are again noted. There is no edema, confluent airspace opacity, pleural effusion or pneumothorax. No acute osseous findings are  evident. There is an old fracture of the mid right clavicle. IMPRESSION: No evidence of acute cardiopulmonary process. Chronic obstructive pulmonary disease. Electronically Signed   By: Richardean Sale M.D.   On: 06/01/2022 13:52    Scheduled Meds:  amLODipine  5 mg Oral Daily   aspirin EC  81 mg Oral Daily   atorvastatin  40 mg Oral QHS   clopidogrel  75 mg Oral Daily   diazepam  2 mg Oral TID   folic acid  1 mg Oral Daily   gabapentin  600 mg Oral TID   heparin  5,000 Units Subcutaneous Q8H   latanoprost  1 drop Right Eye QHS   losartan  50 mg Oral Daily   melatonin  3 mg Oral QHS   multivitamin with minerals  1 tablet Oral Daily   nicotine  21 mg Transdermal Daily   pantoprazole  40 mg Oral Daily   thiamine  100 mg Oral Daily   Or   thiamine  100 mg Intravenous Daily   umeclidinium bromide  1 puff Inhalation Daily   Continuous Infusions:   LOS: 2 days   Time spent: 36 mins  Hays Dunnigan Wynetta Emery, MD How to contact the [facility] Attending or Consulting provider [place] or covering provider during after hours [place], for this patient?  Check the care team in [facility] and look for a) attending/consulting [facility] provider listed and b) the [facility] team listed Log into [email or web address] and use 's universal password to access. If you do not have the password, please contact the hospital operator. Locate the [facility] provider you are looking for under [facility] and page to a number that you can be directly reached. If you still have difficulty reaching the provider, please page the [facility] (Director on Call) for the Hospitalists listed on amion for assistance.  06/03/2022, 12:11 PM

## 2022-06-03 NOTE — Progress Notes (Signed)
Occupational Therapy Treatment Patient Details Name: Timothy Galvan MRN: [id number] DOB: 1956-03-17 Today's Date: 06/03/2022   History of present illness Timothy Galvan is a 66 y.o. male with medical history significant of Asthma, CAD, GERD, HTN, Colon cancer, and PAD. Pt presenting after 3 days acute onset of dizziness and stuttering. Feeling off balance w/ movement. Difficulty ambulating for past 2 days. Denies CP, SOB, DOE. Endorsing Wheezing offa and on lately. Pts son called EMS after pts stutterintg and dizziness did not resolve. Associated w/ increased fatigue over the last few days.   OT comments  Pt motivated to participate in OT treatment today. Pt was able to demonstrated bed mobility with supervision and min G to min A to boost from bed. Once standing pt required min G assist to ambulate to the sink with RW and complete grooming tasks including brushing teeth and washing his face for a total standing time of ~10 minutes. Pt also insisted on attempting ambulation in the room without RW which was more unstable but still at level of min G assist for the 3 to 5 steps taken. Pt is progressing and seems very motivated. Pt will benefit from continued OT in the hospital and recommended venue below to increase strength, balance, and endurance for safe ADL's.      Recommendations for follow up therapy are one component of a multi-disciplinary discharge planning process, led by the attending physician.  Recommendations may be updated based on patient status, additional functional criteria and insurance authorization.    Follow Up Recommendations  Acute inpatient rehab (3hours/day)     Assistance Recommended at Discharge Frequent or constant Supervision/Assistance  Patient can return home with the following  A little help with walking and/or transfers;A little help with bathing/dressing/bathroom;Assistance with cooking/housework;Assist for transportation;Help with stairs or ramp for entrance    Equipment Recommendations  None recommended by OT    Recommendations for Other Services Other (comment) (follow up evaluation with vision specialist)    Precautions / Restrictions Precautions Precautions: Fall Restrictions Weight Bearing Restrictions: No       Mobility Bed Mobility Overal bed mobility: Needs Assistance Bed Mobility: Supine to Sit, Sit to Supine     Supine to sit: Supervision Sit to supine: Supervision   General bed mobility comments: Slightly labored.    Transfers Overall transfer level: Needs assistance Equipment used: Rolling walker (2 wheels) Transfers: Sit to/from Stand Sit to Stand: Min guard, Min assist     Step pivot transfers: Min guard     General transfer comment: Pt needing min G to min A to boost from EOB. Pt able to step and pivot for transfer with min G assist. Continues to be unstable but physical assist not needed today.     Balance Overall balance assessment: Needs assistance Sitting-balance support: No upper extremity supported, Feet supported Sitting balance-Leahy Scale: Good Sitting balance - Comments: seated EOB   Standing balance support: Bilateral upper extremity supported, During functional activity, Reliant on assistive device for balance Standing balance-Leahy Scale: Fair Standing balance comment: with RW                           ADL either performed or assessed with clinical judgement   ADL Overall ADL's : Needs assistance/impaired     Grooming: Wash/dry hands;Wash/dry face;Oral care;Standing;Min guard Grooming Details (indicate cue type and reason): Pt able to stand at the sink to complete grooming tasks with RW and min G assist for ~  10 minutes.             Lower Body Dressing: Supervision/safety;Sitting/lateral leans Lower Body Dressing Details (indicate cue type and reason): Pt able to doff and don socks seated at EOB with extended time only to don L sock.             Functional mobility  during ADLs: Min guard;Rolling walker (2 wheels) General ADL Comments: Pt able to ambulate in room with RW to sink and back with min G assist. Pt also wanted to take a few steps without the RW which he did with min G assist but increased instability.      Cognition Arousal/Alertness: Awake/alert Behavior During Therapy: WFL for tasks assessed/performed Overall Cognitive Status: Within Functional Limits for tasks assessed                                                             Pertinent Vitals/ Pain       Pain Assessment Pain Assessment: Faces Faces Pain Scale: No hurt                                                          Frequency  Min 2X/week        Progress Toward Goals  OT Goals(current goals can now be found in the care plan section)  Progress towards OT goals: Progressing toward goals  Acute Rehab OT Goals Patient Stated Goal: Get stronger. OT Goal Formulation: With patient Time For Goal Achievement: 06/16/22 Potential to Achieve Goals: Good ADL Goals Pt Will Perform Grooming: with modified independence;standing Pt Will Perform Lower Body Bathing: with modified independence;sitting/lateral leans Pt Will Perform Lower Body Dressing: with modified independence;sitting/lateral leans Pt Will Transfer to Toilet: with modified independence;ambulating Pt Will Perform Toileting - Clothing Manipulation and hygiene: with modified independence;sitting/lateral leans Pt/caregiver will Perform Home Exercise Program: Increased strength;Both right and left upper extremity;Independently Additional ADL Goal #1: Pt will demosntrate improved visual tracking and and visual fields by smooth tracking and identification of peripheral objecst within normal limits during formal testing.  Plan Discharge plan remains appropriate                                    End of Session Equipment Utilized During Treatment:  Rolling walker (2 wheels);Gait belt  OT Visit Diagnosis: Unsteadiness on feet (R26.81);Other abnormalities of gait and mobility (R26.89);Muscle weakness (generalized) (M62.81);Other symptoms and signs involving the nervous system (R29.898);Dizziness and giddiness (R42)   Activity Tolerance Patient tolerated treatment well   Patient Left with call bell/phone within reach;in bed   Nurse Communication          Time: 0569-7948 OT Time Calculation (min): 24 min  Charges: OT General Charges $OT Visit: 1 Visit OT Treatments $Self Care/Home Management : 23-37 mins  Davaughn Hillyard OT, MOT  Larey Seat 06/03/2022, 2:06 PM

## 2022-06-03 NOTE — Care Management Important Message (Signed)
Important Message  Patient Details  Name: Timothy Galvan MRN: [id number] Date of Birth: 1956/07/07   Medicare Important Message Given:  Yes     Tommy Medal 06/03/2022, 12:15 PM

## 2022-06-03 NOTE — TOC Progression Note (Signed)
Transition of Care [facility]) - Progression Note    Patient Details  Name: Timothy Galvan MRN: [id number] Date of Birth: 1956-03-14  Transition of Care [facility]) CM/SW Contact  Salome Arnt, [place] Phone Number: 06/03/2022, 8:41 AM  Clinical Narrative:  TOC received consult for substance abuse. Pt reports history of alcohol and cocaine use. Pt does not feel these are a problem for him and declines resources. TOC will continue to follow.     Expected Discharge Plan: IP Rehab Facility Barriers to Discharge: Insurance Authorization  Expected Discharge Plan and Services Expected Discharge Plan: [place] In-house Referral: Clinical Social Work   Post Acute Care Choice: IP Rehab Living arrangements for the past 2 months: Apartment                                       Social Determinants of Health (SDOH) Interventions    Readmission Risk Interventions    06/02/2022   12:55 PM  Readmission Risk Prevention Plan  Medication Screening Complete  Transportation Screening Complete

## 2022-06-04 LAB — BASIC METABOLIC PANEL
Anion gap: 5 (ref 5–15)
BUN: 13 mg/dL (ref 8–23)
CO2: 26 mmol/L (ref 22–32)
Calcium: 8.6 mg/dL — ABNORMAL LOW (ref 8.9–10.3)
Chloride: 108 mmol/L (ref 98–111)
Creatinine, Ser: 1.02 mg/dL (ref 0.61–1.24)
GFR, Estimated: >60 mL/min (ref >60)
Glucose, Bld: 107 mg/dL — ABNORMAL HIGH (ref 70–99)
Potassium: 2.9 mmol/L — ABNORMAL LOW (ref 3.5–5.1)
Sodium: 139 mmol/L (ref 135–145)

## 2022-06-04 LAB — MAGNESIUM: Magnesium: 1.6 mg/dL — ABNORMAL LOW (ref 1.7–2.4)

## 2022-06-04 LAB — PHOSPHORUS: Phosphorus: 3.2 mg/dL (ref 2.5–4.6)

## 2022-06-04 MED ORDER — MAGNESIUM SULFATE 4 GM/100ML IV SOLN
4.0000 g | Freq: Once | INTRAVENOUS | Status: AC
  Administered 2022-06-04: 4 g via INTRAVENOUS
  Filled 2022-06-04: qty 100

## 2022-06-04 MED ORDER — POTASSIUM CHLORIDE 20 MEQ PO PACK
60.0000 meq | PACK | Freq: Once | ORAL | Status: AC
  Administered 2022-06-04: 60 meq via ORAL
  Filled 2022-06-04: qty 3

## 2022-06-04 MED ORDER — HYDRALAZINE HCL 10 MG PO TABS
10.0000 mg | ORAL_TABLET | Freq: Three times a day (TID) | ORAL | Status: DC
  Administered 2022-06-04 – 2022-06-05 (×4): 10 mg via ORAL
  Filled 2022-06-04 (×4): qty 1

## 2022-06-04 NOTE — Progress Notes (Addendum)
PROGRESS NOTE   Timothy Galvan  YDX:[id number] DOB: July 26, 1955 DOA: 06/01/2022 PCP: [facility]   Chief Complaint  Patient presents with   Dizziness   Level of care: Telemetry  Brief Admission History:   66 y.o. male with medical history significant of Asthma, CAD, GERD, HTN, Colon cancer, PAD, and Cocaine use.    Pt presenting after 3 days of dizziness, and stuttering. Acute onset. Feeling off balance w/ movement. Difficulty ambulating for past 2 days. Denies CP, SOB, DOE. Endorsing Wheezing offa and on lately. Pts son called EMS after pts stutterintg and dizziness did not resolve. Associated w/ increased fatigue over the last few days.    Level V caveat appies as pt unable to provide much history due to speech change from stroke. History mostly provided by family.   Assessment and Plan:  Acute and subacute strokes with speech deficits and left-sided hemiparesis- -MRI/MRA consistent with Acute to Subacute infarct in L basal ganglia and R pons w/o hemorrhage, chronic small-vessel ischemic change and remote cortical infarct in the right frontoparietal region -expressive aphasia, left-sided hemiparesis especially of the left upper extremity -Echo with EF of 45 to 50% with wall motion abnormalities, no aortic stenosis, mild pulmonary hypertension -EF is down from  55% back in 2022 -Total cholesterol 134, triglycerides 165, HDL 49, LDL 52---Even if his lipid panel is within desired limits, patient should still take Lipitor for it's Pleiotropic effects (beyond cholesterol lowering benefits) -A1c - 6.3%  -Neurologist Recs:  Aspirin 81 mg daily along with Plavix 75 mg daily for 90 days then after that STOP the Plavix  and continue ONLY Aspirin 81 mg daily indefinitely--for secondary stroke Prevention -30 day Event/Holter Monitor arranged with [facility] who plans to mail to patient's home.  -PT/OT eval appreciated recommends -BP suboptimally controlled: started losartan 50 mg  with amlodipine 5 mg daily on 11/22.    HFrEF--- patient with history of cardiomyopathy in setting of alcohol and cocaine abuse -Previously followed by [facility] -Echo as noted above #1, additional echo findings include significant wall motion normalities as listed here-The antero-lateral wall, posterior wall, and basal inferior segment are hypokinetic. The entire anterior wall, entire septum, entire apex, and mid and distal inferior wall are normal.  - started losartan 50 mg daily   Severe PAD  -CTA neck, and MRA angio head consistent with occlusion of the right ICA at its origin.  -occlusion of the right ICA from the neck through the communicating segment where there is reconstitution via collateral flow. 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 -Focal moderate to severe stenosis of the distal right M1 segment and short-segment severe stenosis of the left P2/P3 segment. -Lipitor as above #1 -continue Aspirin 81 mg daily along with Plavix 75 mg daily for 90 days then after that STOP the Plavix  and continue ONLY Aspirin 81 mg daily indefinitely--for secondary stroke Prevention -Ambulatory referral placed to IR clinic as recommended  Polysubstance abuse--- mostly cocaine, tobacco and alcohol -UDS is again positive for cocaine -Abstinence from alcohol tobacco and cocaine strongly advised  HTN-- permissive hypertension initially - ok to normalize BP per neuro - added losartan 50 mg daily, amlodipine 5 mg daily, hydralazine 10 mg TID  COPD/Asthma - no acute exacerbation at this time smoking cessation is advised   Tobacco abuse - nicotine patch ordered, strongly advised patient to stop all tobacco use  ETOH abuse - continue scheduled diazepam - continue CIWA protocol - CIWA low scores at this  time; follow   GERD - continue protonix for GI protection   Hypokalemia/Hypomagnesemia - replacement ordered, recheck in AM   DVT prophylaxis: [place] heparin  Code Status: Full  Family  Communication:  Disposition: Status is: Inpatient Remains inpatient appropriate because: awaiting CIR bed, anticipating on 11/24   Consultants:   Procedures:   Antimicrobials:    Subjective: Pt without complaints, speech may be a tiny bit better today but difficult to tell.      Objective: Vitals:   06/03/22 0913 06/03/22 2114 06/04/22 0457 06/04/22 0821  BP: (!) 159/76 (!) 159/77 (!) 141/79   Pulse: 89 (!) 50 90   Resp: '20 19 19   '$ Temp:  98 F (36.7 C) 97.6 F (36.4 C)   TempSrc:      SpO2: 100% 92% 94% 95%  Weight:      Height:        Intake/Output Summary (Last 24 hours) at 06/04/2022 1359 Last data filed at 06/04/2022 1259 Gross per 24 hour  Intake 840 ml  Output 1300 ml  Net -460 ml   Filed Weights   06/01/22 1251  Weight: 63.5 kg   Examination:  General exam: speech remains difficult to understand.  Appears calm and comfortable  Respiratory system: Clear to auscultation. Respiratory effort normal. Cardiovascular system: normal S1 & S2 heard. No JVD, murmurs, rubs, gallops or clicks. No pedal edema. Gastrointestinal system: Abdomen is nondistended, soft and nontender. No organomegaly or masses felt. Normal bowel sounds heard. Central nervous system: Alert and oriented. Left hemiparesis noted. Extremities: Symmetric 5 x 5 power. Skin: No rashes, lesions or ulcers. Psychiatry: Judgement and insight appear poor. Mood & affect appropriate.   Data Reviewed: I have personally reviewed following labs and imaging studies  CBC: Recent Labs  Lab 06/01/22 1431  WBC 6.1  NEUTROABS 1.9  HGB 13.0  HCT 41.9  MCV 93.9  PLT 427   Basic Metabolic Panel: Recent Labs  Lab 06/01/22 1431 06/04/22 0420  NA 143 139  K 3.3* 2.9*  CL 114* 108  CO2 24 26  GLUCOSE 119* 107*  BUN 13 13  CREATININE 0.90 1.02  CALCIUM 8.9 8.6*  MG  --  1.6*  PHOS  --  3.2   CBG: Recent Labs  Lab 06/01/22 1433  GLUCAP 128*   No results found for this or any previous visit  (from the past 240 hour(s)).   Radiology Studies: No results found.  Scheduled Meds:  amLODipine  5 mg Oral Daily   aspirin EC  81 mg Oral Daily   atorvastatin  40 mg Oral QHS   clopidogrel  75 mg Oral Daily   diazepam  2 mg Oral TID   folic acid  1 mg Oral Daily   gabapentin  600 mg Oral TID   heparin  5,000 Units Subcutaneous Q8H   hydrALAZINE  10 mg Oral Q8H   latanoprost  1 drop Right Eye QHS   losartan  50 mg Oral Daily   melatonin  3 mg Oral QHS   multivitamin with minerals  1 tablet Oral Daily   nicotine  21 mg Transdermal Daily   pantoprazole  40 mg Oral Daily   thiamine  100 mg Oral Daily   Or   thiamine  100 mg Intravenous Daily   umeclidinium bromide  1 puff Inhalation Daily   Continuous Infusions:   LOS: 3 days   Time spent: 35 mins  Yifan Auker Wynetta Emery, MD How to contact the [facility] Attending or  Consulting provider [place] or covering provider during after hours [place], for this patient?  Check the care team in [facility] and look for a) attending/consulting TRH provider listed and b) the [facility] team listed Log into [email or web address] and use [place]'s universal password to access. If you do not have the password, please contact the hospital operator. Locate the [facility] provider you are looking for under [facility] and page to a number that you can be directly reached. If you still have difficulty reaching the provider, please page the [facility] (Director on Call) for the Hospitalists listed on amion for assistance.  06/04/2022, 1:59 PM

## 2022-06-05 ENCOUNTER — Inpatient Hospital Stay (HOSPITAL_COMMUNITY)
Admission: RE | Admit: 2022-06-05 | Discharge: 2022-06-12 | DRG: 057 | Disposition: A | Discharge status: Home Health | Payer: Medicare Other | Source: Other Acute Inpatient Hospital | Attending: Physical Medicine and Rehabilitation | Admitting: Physical Medicine and Rehabilitation

## 2022-06-05 ENCOUNTER — Other Ambulatory Visit: Payer: Self-pay

## 2022-06-05 ENCOUNTER — Encounter (HOSPITAL_COMMUNITY): Payer: Self-pay | Admitting: Physical Medicine and Rehabilitation

## 2022-06-05 DIAGNOSIS — I69318 Other symptoms and signs involving cognitive functions following cerebral infarction: Secondary | ICD-10-CM

## 2022-06-05 DIAGNOSIS — Z880 Allergy status to penicillin: Secondary | ICD-10-CM | POA: Diagnosis not present

## 2022-06-05 DIAGNOSIS — I639 Cerebral infarction, unspecified: Secondary | ICD-10-CM | POA: Diagnosis not present

## 2022-06-05 DIAGNOSIS — I69391 Dysphagia following cerebral infarction: Secondary | ICD-10-CM

## 2022-06-05 DIAGNOSIS — Z79899 Other long term (current) drug therapy: Secondary | ICD-10-CM

## 2022-06-05 DIAGNOSIS — F10139 Alcohol abuse with withdrawal, unspecified: Secondary | ICD-10-CM | POA: Diagnosis present

## 2022-06-05 DIAGNOSIS — R2689 Other abnormalities of gait and mobility: Secondary | ICD-10-CM | POA: Diagnosis present

## 2022-06-05 DIAGNOSIS — Z85038 Personal history of other malignant neoplasm of large intestine: Secondary | ICD-10-CM

## 2022-06-05 DIAGNOSIS — I10 Essential (primary) hypertension: Secondary | ICD-10-CM | POA: Diagnosis present

## 2022-06-05 DIAGNOSIS — F17210 Nicotine dependence, cigarettes, uncomplicated: Secondary | ICD-10-CM | POA: Diagnosis present

## 2022-06-05 DIAGNOSIS — F419 Anxiety disorder, unspecified: Secondary | ICD-10-CM | POA: Diagnosis present

## 2022-06-05 DIAGNOSIS — K219 Gastro-esophageal reflux disease without esophagitis: Secondary | ICD-10-CM | POA: Diagnosis present

## 2022-06-05 DIAGNOSIS — Z7902 Long term (current) use of antithrombotics/antiplatelets: Secondary | ICD-10-CM | POA: Diagnosis not present

## 2022-06-05 DIAGNOSIS — I739 Peripheral vascular disease, unspecified: Secondary | ICD-10-CM | POA: Diagnosis present

## 2022-06-05 DIAGNOSIS — J4489 Other specified chronic obstructive pulmonary disease: Secondary | ICD-10-CM | POA: Diagnosis present

## 2022-06-05 DIAGNOSIS — R1310 Dysphagia, unspecified: Secondary | ICD-10-CM | POA: Diagnosis present

## 2022-06-05 DIAGNOSIS — I2510 Atherosclerotic heart disease of native coronary artery without angina pectoris: Secondary | ICD-10-CM | POA: Diagnosis present

## 2022-06-05 DIAGNOSIS — R739 Hyperglycemia, unspecified: Secondary | ICD-10-CM | POA: Diagnosis present

## 2022-06-05 DIAGNOSIS — I69322 Dysarthria following cerebral infarction: Secondary | ICD-10-CM

## 2022-06-05 DIAGNOSIS — I63033 Cerebral infarction due to thrombosis of bilateral carotid arteries: Secondary | ICD-10-CM

## 2022-06-05 DIAGNOSIS — I69398 Other sequelae of cerebral infarction: Principal | ICD-10-CM

## 2022-06-05 DIAGNOSIS — Z7982 Long term (current) use of aspirin: Secondary | ICD-10-CM | POA: Diagnosis not present

## 2022-06-05 DIAGNOSIS — E876 Hypokalemia: Secondary | ICD-10-CM | POA: Diagnosis present

## 2022-06-05 DIAGNOSIS — N179 Acute kidney failure, unspecified: Secondary | ICD-10-CM | POA: Diagnosis present

## 2022-06-05 DIAGNOSIS — I6340 Cerebral infarction due to embolism of unspecified cerebral artery: Secondary | ICD-10-CM | POA: Diagnosis present

## 2022-06-05 DIAGNOSIS — E785 Hyperlipidemia, unspecified: Secondary | ICD-10-CM | POA: Diagnosis present

## 2022-06-05 DIAGNOSIS — R1312 Dysphagia, oropharyngeal phase: Secondary | ICD-10-CM | POA: Diagnosis present

## 2022-06-05 DIAGNOSIS — J449 Chronic obstructive pulmonary disease, unspecified: Secondary | ICD-10-CM | POA: Diagnosis present

## 2022-06-05 LAB — BASIC METABOLIC PANEL
Anion gap: 8 (ref 5–15)
BUN: 9 mg/dL (ref 8–23)
CO2: 25 mmol/L (ref 22–32)
Calcium: 9.3 mg/dL (ref 8.9–10.3)
Chloride: 110 mmol/L (ref 98–111)
Creatinine, Ser: 1.02 mg/dL (ref 0.61–1.24)
GFR, Estimated: >60 mL/min (ref >60)
Glucose, Bld: 100 mg/dL — ABNORMAL HIGH (ref 70–99)
Potassium: 3.6 mmol/L (ref 3.5–5.1)
Sodium: 143 mmol/L (ref 135–145)

## 2022-06-05 LAB — MAGNESIUM: Magnesium: 2.1 mg/dL (ref 1.7–2.4)

## 2022-06-05 MED ORDER — PANTOPRAZOLE SODIUM 40 MG PO TBEC: 40.0000 mg | DELAYED_RELEASE_TABLET | Freq: Every day | ORAL | Status: DC

## 2022-06-05 MED ORDER — ALBUTEROL SULFATE (2.5 MG/3ML) 0.083% IN NEBU: 2.5000 mg | INHALATION_SOLUTION | RESPIRATORY_TRACT | Status: DC | PRN

## 2022-06-05 MED ORDER — ASPIRIN 81 MG PO TBEC
81.0000 mg | DELAYED_RELEASE_TABLET | Freq: Every day | ORAL | Status: DC
  Administered 2022-06-06 – 2022-06-12 (×7): 81 mg via ORAL
  Filled 2022-06-05 (×7): qty 1

## 2022-06-05 MED ORDER — DIPHENHYDRAMINE HCL 12.5 MG/5ML PO ELIX: 12.5000 mg | ORAL_SOLUTION | Freq: Four times a day (QID) | ORAL | Status: DC | PRN

## 2022-06-05 MED ORDER — ADULT MULTIVITAMIN W/MINERALS CH
1.0000 | ORAL_TABLET | Freq: Every day | ORAL | Status: DC
  Administered 2022-06-06 – 2022-06-12 (×7): 1 via ORAL
  Filled 2022-06-05 (×7): qty 1

## 2022-06-05 MED ORDER — SENNOSIDES-DOCUSATE SODIUM 8.6-50 MG PO TABS: 1.0000 | ORAL_TABLET | Freq: Every evening | ORAL | Status: DC | PRN

## 2022-06-05 MED ORDER — BISACODYL 10 MG RE SUPP: 10.0000 mg | Freq: Every day | RECTAL | Status: DC | PRN

## 2022-06-05 MED ORDER — LORAZEPAM 2 MG/ML IJ SOLN: 1.0000 mg | INTRAMUSCULAR | Status: DC | PRN

## 2022-06-05 MED ORDER — HYDRALAZINE HCL 25 MG PO TABS
25.0000 mg | ORAL_TABLET | Freq: Three times a day (TID) | ORAL | Status: DC
  Administered 2022-06-05 – 2022-06-12 (×20): 25 mg via ORAL
  Filled 2022-06-05 (×21): qty 1

## 2022-06-05 MED ORDER — GUAIFENESIN-DM 100-10 MG/5ML PO SYRP: 5.0000 mL | ORAL_SOLUTION | Freq: Four times a day (QID) | ORAL | Status: DC | PRN

## 2022-06-05 MED ORDER — AMLODIPINE BESYLATE 5 MG PO TABS: 5.0000 mg | ORAL_TABLET | Freq: Every day | ORAL | Status: DC

## 2022-06-05 MED ORDER — CLOPIDOGREL BISULFATE 75 MG PO TABS
75.0000 mg | ORAL_TABLET | Freq: Every day | ORAL | Status: DC
  Administered 2022-06-06 – 2022-06-12 (×7): 75 mg via ORAL
  Filled 2022-06-05 (×7): qty 1

## 2022-06-05 MED ORDER — ISOSORBIDE MONONITRATE ER 30 MG PO TB24: 30.0000 mg | ORAL_TABLET | Freq: Every day | ORAL | Status: DC

## 2022-06-05 MED ORDER — FOLIC ACID 1 MG PO TABS: 1.0000 mg | ORAL_TABLET | Freq: Every day | ORAL | Status: DC

## 2022-06-05 MED ORDER — FOLIC ACID 1 MG PO TABS
1.0000 mg | ORAL_TABLET | Freq: Every day | ORAL | Status: DC
  Administered 2022-06-06 – 2022-06-09 (×4): 1 mg via ORAL
  Filled 2022-06-05 (×4): qty 1

## 2022-06-05 MED ORDER — THIAMINE MONONITRATE 100 MG PO TABS
100.0000 mg | ORAL_TABLET | Freq: Every day | ORAL | Status: DC
  Administered 2022-06-06 – 2022-06-12 (×7): 100 mg via ORAL
  Filled 2022-06-05 (×7): qty 1

## 2022-06-05 MED ORDER — SENNOSIDES-DOCUSATE SODIUM 8.6-50 MG PO TABS: 2.0000 | ORAL_TABLET | Freq: Every evening | ORAL | Status: DC | PRN

## 2022-06-05 MED ORDER — TRAZODONE HCL 50 MG PO TABS
25.0000 mg | ORAL_TABLET | Freq: Every evening | ORAL | Status: DC | PRN
  Administered 2022-06-08 – 2022-06-11 (×4): 50 mg via ORAL
  Filled 2022-06-05 (×4): qty 1

## 2022-06-05 MED ORDER — FLEET ENEMA 7-19 GM/118ML RE ENEM: 1.0000 | ENEMA | Freq: Once | RECTAL | Status: DC | PRN

## 2022-06-05 MED ORDER — GABAPENTIN 300 MG PO CAPS
600.0000 mg | ORAL_CAPSULE | Freq: Three times a day (TID) | ORAL | Status: DC
  Administered 2022-06-05 – 2022-06-12 (×21): 600 mg via ORAL
  Filled 2022-06-05 (×21): qty 2

## 2022-06-05 MED ORDER — LORAZEPAM 0.5 MG PO TABS: 1.0000 mg | ORAL_TABLET | ORAL | Status: DC | PRN

## 2022-06-05 MED ORDER — ATORVASTATIN CALCIUM 40 MG PO TABS
40.0000 mg | ORAL_TABLET | Freq: Every day | ORAL | Status: DC
  Administered 2022-06-05 – 2022-06-11 (×7): 40 mg via ORAL
  Filled 2022-06-05 (×7): qty 1

## 2022-06-05 MED ORDER — UMECLIDINIUM BROMIDE 62.5 MCG/ACT IN AEPB
1.0000 | INHALATION_SPRAY | Freq: Every day | RESPIRATORY_TRACT | Status: DC
  Administered 2022-06-06 – 2022-06-12 (×6): 1 via RESPIRATORY_TRACT
  Filled 2022-06-05: qty 7

## 2022-06-05 MED ORDER — HYDRALAZINE HCL 25 MG PO TABS: 25.0000 mg | ORAL_TABLET | Freq: Three times a day (TID) | ORAL | Status: DC

## 2022-06-05 MED ORDER — ALUM & MAG HYDROXIDE-SIMETH 200-200-20 MG/5ML PO SUSP
30.0000 mL | ORAL | Status: DC | PRN
  Administered 2022-06-11: 30 mL via ORAL
  Filled 2022-06-05: qty 30

## 2022-06-05 MED ORDER — MAGNESIUM OXIDE -MG SUPPLEMENT 400 (240 MG) MG PO TABS
400.0000 mg | ORAL_TABLET | Freq: Every day | ORAL | Status: DC
  Administered 2022-06-05 – 2022-06-10 (×6): 400 mg via ORAL
  Filled 2022-06-05 (×6): qty 1

## 2022-06-05 MED ORDER — LOSARTAN POTASSIUM 50 MG PO TABS
50.0000 mg | ORAL_TABLET | Freq: Every day | ORAL | Status: DC
  Administered 2022-06-06 – 2022-06-12 (×7): 50 mg via ORAL
  Filled 2022-06-05 (×7): qty 1

## 2022-06-05 MED ORDER — PROCHLORPERAZINE MALEATE 5 MG PO TABS
5.0000 mg | ORAL_TABLET | Freq: Four times a day (QID) | ORAL | Status: DC | PRN
  Administered 2022-06-11: 5 mg via ORAL
  Filled 2022-06-05: qty 1

## 2022-06-05 MED ORDER — AMLODIPINE BESYLATE 5 MG PO TABS
5.0000 mg | ORAL_TABLET | Freq: Every day | ORAL | Status: DC
  Administered 2022-06-06 – 2022-06-11 (×6): 5 mg via ORAL
  Filled 2022-06-05 (×6): qty 1

## 2022-06-05 MED ORDER — LATANOPROST 0.005 % OP SOLN
1.0000 [drp] | Freq: Every day | OPHTHALMIC | Status: DC
  Administered 2022-06-05 – 2022-06-11 (×7): 1 [drp] via OPHTHALMIC

## 2022-06-05 MED ORDER — ENOXAPARIN SODIUM 40 MG/0.4ML IJ SOSY
40.0000 mg | PREFILLED_SYRINGE | INTRAMUSCULAR | Status: DC
  Administered 2022-06-05 – 2022-06-08 (×4): 40 mg via SUBCUTANEOUS
  Filled 2022-06-05 (×4): qty 0.4

## 2022-06-05 MED ORDER — NICOTINE 21 MG/24HR TD PT24
21.0000 mg | MEDICATED_PATCH | Freq: Every day | TRANSDERMAL | Status: DC
  Administered 2022-06-06 – 2022-06-12 (×7): 21 mg via TRANSDERMAL
  Filled 2022-06-05 (×7): qty 1

## 2022-06-05 MED ORDER — PROCHLORPERAZINE EDISYLATE 10 MG/2ML IJ SOLN: 5.0000 mg | Freq: Four times a day (QID) | INTRAMUSCULAR | Status: DC | PRN

## 2022-06-05 MED ORDER — ACETAMINOPHEN 325 MG PO TABS
325.0000 mg | ORAL_TABLET | ORAL | Status: DC | PRN
  Administered 2022-06-07 – 2022-06-12 (×5): 650 mg via ORAL
  Filled 2022-06-05 (×5): qty 2

## 2022-06-05 MED ORDER — THIAMINE HCL 100 MG/ML IJ SOLN
100.0000 mg | Freq: Every day | INTRAMUSCULAR | Status: DC
  Filled 2022-06-05 (×7): qty 1

## 2022-06-05 MED ORDER — PANTOPRAZOLE SODIUM 40 MG PO TBEC
40.0000 mg | DELAYED_RELEASE_TABLET | Freq: Every day | ORAL | Status: DC
  Administered 2022-06-06 – 2022-06-12 (×7): 40 mg via ORAL
  Filled 2022-06-05 (×7): qty 1

## 2022-06-05 MED ORDER — PROCHLORPERAZINE 25 MG RE SUPP: 12.5000 mg | Freq: Four times a day (QID) | RECTAL | Status: DC | PRN

## 2022-06-05 MED ORDER — MELATONIN 3 MG PO TABS
3.0000 mg | ORAL_TABLET | Freq: Every day | ORAL | Status: DC
  Administered 2022-06-05 – 2022-06-11 (×7): 3 mg via ORAL
  Filled 2022-06-05 (×7): qty 1

## 2022-06-05 NOTE — Discharge Summary (Signed)
Physician Discharge Summary  Timothy Galvan NIO:[id number] DOB: 06-14-1956 DOA: 06/01/2022  PCP: [facility] [place] date: 06/01/2022 Discharge date: 06/05/2022  Disposition: Inpatient Rehab (CIR)  Recommendations for Outpatient Follow-up:  Follow up with IR Radiology in 2 weeks for evaluation for intervention Follow up with neurology in 2 months  Aspirin 81 mg daily along with Plavix 75 mg daily for 90 days then after that STOP the Plavix  and continue ONLY Aspirin 81 mg daily indefinitely--for secondary stroke Prevention  Discharge Condition: STABLE   CODE STATUS: FULL DIET: Dysphagia 3, nectar thick liquids   Brief Hospitalization Summary: Please see all hospital notes, images, labs for full details of the hospitalization. Brief Admission History:   66 y.o. male with medical history significant of Asthma, CAD, GERD, HTN, Colon cancer, PAD, and Cocaine use.    Pt presenting after 3 days of dizziness, and stuttering. Acute onset. Feeling off balance w/ movement. Difficulty ambulating for past 2 days. Denies CP, SOB, DOE. Endorsing Wheezing offa and on lately. Pts son called EMS after pts stutterintg and dizziness did not resolve. Associated w/ increased fatigue over the last few days.    Level V caveat appies as pt unable to provide much history due to speech change from stroke. History mostly provided by family.    Assessment and Plan:   Acute and subacute strokes with speech deficits and left-sided hemiparesis- -MRI/MRA consistent with Acute to Subacute infarct in L basal ganglia and R pons w/o hemorrhage, chronic small-vessel ischemic change and remote cortical infarct in the right frontoparietal region -expressive aphasia, left-sided hemiparesis especially of the left upper extremity -Echo with EF of 45 to 50% with wall motion abnormalities, no aortic stenosis, mild pulmonary hypertension -EF is down from  55% back in 2022 -Total cholesterol 134, triglycerides  165, HDL 49, LDL 52---Even if his lipid panel is within desired limits, patient should still take Lipitor for it's Pleiotropic effects (beyond cholesterol lowering benefits) -A1c - 6.3%  -Neurologist Recs:  Aspirin 81 mg daily along with Plavix 75 mg daily for 90 days then after that STOP the Plavix  and continue ONLY Aspirin 81 mg daily indefinitely--for secondary stroke Prevention -30 day Event/Holter Monitor arranged with L. Pinnix who plans to mail to patient's home.  -PT/OT eval appreciated recommends -BP management is ongoing with goal to normalize BP   HFrEF--- patient with history of cardiomyopathy in setting of alcohol and cocaine abuse -Previously followed by [facility] -Echo as noted above #1, additional echo findings include significant wall motion normalities as listed here-The antero-lateral wall, posterior wall, and basal inferior segment are hypokinetic. The entire anterior wall, entire septum, entire apex, and mid and distal inferior wall are normal.  - started losartan back and increased to home dose of 100 mg daily at discharge   Severe PAD  -CTA neck, and MRA angio head consistent with occlusion of the right ICA at its origin.  -occlusion of the right ICA from the neck through the communicating segment where there is reconstitution via collateral flow. 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 -Focal moderate to severe stenosis of the distal right M1 segment and short-segment severe stenosis of the left P2/P3 segment. -Lipitor as above #1 -continue Aspirin 81 mg daily along with Plavix 75 mg daily for 90 days then after that STOP the Plavix  and continue ONLY Aspirin 81 mg daily indefinitely--for secondary stroke Prevention -Ambulatory referral placed to IR clinic as recommended   Polysubstance abuse--- mostly  cocaine, tobacco and alcohol -UDS is again positive for cocaine -Abstinence from alcohol tobacco and cocaine strongly advised   HTN-- permissive hypertension  initially - ok to normalize BP per neuro - added losartan, increase to 100 mg daily, amlodipine 5 mg daily, imdur 30 mg    COPD/Asthma - no acute exacerbation at this time smoking cessation is advised    Tobacco abuse - nicotine patch ordered, strongly advised patient to stop all tobacco use   ETOH abuse - scheduled diazepam to avoid withdrawal symptoms - continue CIWA protocol - CIWA low scores at this time; follow    GERD - continue protonix for GI protection    Hypokalemia/Hypomagnesemia - repleted    Discharge Diagnoses:  Principal Problem:   Stroke (cerebrum) ([place]) Active Problems:   GERD (gastroesophageal reflux disease)   CAD (coronary artery disease)   HLD (hyperlipidemia)   PAD (peripheral artery disease) (HCC)   Colon cancer (HCC)   COPD (chronic obstructive pulmonary disease) ([place])   Discharge Instructions: Discharge Instructions     Ambulatory referral to Interventional Radiology   Complete by: As directed    Hospital Follow up stroke   Ambulatory referral to Neurology   Complete by: As directed    An appointment is requested in approximately: 8 weeks      Allergies as of 06/05/2022       Reactions   Penicillins    Has patient had a PCN reaction causing immediate rash, facial/tongue/throat swelling, SOB or lightheadedness with hypotension: Unknown Has patient had a PCN reaction causing severe rash involving mucus membranes or skin necrosis: Unknown Has patient had a PCN reaction that required hospitalization: Unknown Has patient had a PCN reaction occurring within the last 10 years: Unknown If all of the above answers are "NO", then may proceed with Cephalosporin use.        Medication List     STOP taking these medications    acetaminophen 325 MG tablet Commonly known as: TYLENOL   alendronate 70 MG tablet Commonly known as: Fosamax   clindamycin 150 MG capsule Commonly known as: CLEOCIN   HYDROcodone-acetaminophen 5-325 MG  tablet Commonly known as: NORCO/VICODIN   ibuprofen 600 MG tablet Commonly known as: ADVIL   meloxicam 15 MG tablet Commonly known as: MOBIC   metoprolol tartrate 25 MG tablet Commonly known as: LOPRESSOR   omeprazole 20 MG capsule Commonly known as: PRILOSEC Replaced by: pantoprazole 40 MG tablet   Probiotic 250 MG Caps   traMADol 50 MG tablet Commonly known as: ULTRAM       TAKE these medications    amLODipine 5 MG tablet Commonly known as: NORVASC Take 1 tablet (5 mg total) by mouth daily. Start taking on: June 06, 2022   aspirin EC 81 MG tablet Take 81 mg by mouth at bedtime.   atorvastatin 40 MG tablet Commonly known as: LIPITOR Take 40 mg by mouth at bedtime.   carvedilol 6.25 MG tablet Commonly known as: COREG Take 6.25 mg by mouth 2 (two) times daily.   clopidogrel 75 MG tablet Commonly known as: PLAVIX Take 75 mg by mouth daily.   folic acid 1 MG tablet Commonly known as: FOLVITE Take 1 tablet (1 mg total) by mouth daily. Start taking on: June 06, 2022   gabapentin 300 MG capsule Commonly known as: NEURONTIN Take 600 mg by mouth 3 (three) times daily.   Ipratropium-Albuterol 20-100 MCG/ACT Aers respimat Commonly known as: COMBIVENT Inhale 1 puff into the lungs every 6 (six)  hours as needed for wheezing.   isosorbide mononitrate 30 MG 24 hr tablet Commonly known as: IMDUR Take 1 tablet (30 mg total) by mouth daily. What changed:  medication strength how much to take   latanoprost 0.005 % ophthalmic solution Commonly known as: XALATAN Place 1 drop into the right eye at bedtime.   losartan 100 MG tablet Commonly known as: COZAAR Take 100 mg by mouth daily.   melatonin 5 MG Tabs Take 5 mg by mouth at bedtime.   nitroGLYCERIN 0.4 MG SL tablet Commonly known as: NITROSTAT Place 1 tablet (0.4 mg total) under the tongue every 5 (five) minutes as needed for chest pain.   pantoprazole 40 MG tablet Commonly known as:  PROTONIX Take 1 tablet (40 mg total) by mouth daily. Start taking on: June 06, 2022 Replaces: omeprazole 20 MG capsule        Follow-up Information     de Sindy Messing, Erven Colla, MD. Schedule an appointment as soon as possible for a visit in 2 week(s).   Specialties: Radiology, Interventional Radiology Why: Hospital Follow Up to discuss possible stent placement Contact information: [place] [address] [place] [address] [phone]         [place]. Schedule an appointment as soon as possible for a visit in 2 month(s).   Why: Hospital Follow Up Contact information: [address]     [address] [place] [address] [phone]               Allergies  Allergen Reactions   Penicillins     Has patient had a PCN reaction causing immediate rash, facial/tongue/throat swelling, SOB or lightheadedness with hypotension: Unknown Has patient had a PCN reaction causing severe rash involving mucus membranes or skin necrosis: Unknown Has patient had a PCN reaction that required hospitalization: Unknown Has patient had a PCN reaction occurring within the last 10 years: Unknown If all of the above answers are "NO", then may proceed with Cephalosporin use.    Allergies as of 06/05/2022       Reactions   Penicillins    Has patient had a PCN reaction causing immediate rash, facial/tongue/throat swelling, SOB or lightheadedness with hypotension: Unknown Has patient had a PCN reaction causing severe rash involving mucus membranes or skin necrosis: Unknown Has patient had a PCN reaction that required hospitalization: Unknown Has patient had a PCN reaction occurring within the last 10 years: Unknown If all of the above answers are "NO", then may proceed with Cephalosporin use.        Medication List     STOP taking these medications    acetaminophen 325 MG tablet Commonly known as: TYLENOL   alendronate 70 MG  tablet Commonly known as: Fosamax   clindamycin 150 MG capsule Commonly known as: CLEOCIN   HYDROcodone-acetaminophen 5-325 MG tablet Commonly known as: NORCO/VICODIN   ibuprofen 600 MG tablet Commonly known as: ADVIL   meloxicam 15 MG tablet Commonly known as: MOBIC   metoprolol tartrate 25 MG tablet Commonly known as: LOPRESSOR   omeprazole 20 MG capsule Commonly known as: PRILOSEC Replaced by: pantoprazole 40 MG tablet   Probiotic 250 MG Caps   traMADol 50 MG tablet Commonly known as: ULTRAM       TAKE these medications    amLODipine 5 MG tablet Commonly known as: NORVASC Take 1 tablet (5 mg total) by mouth daily. Start taking on: June 06, 2022   aspirin EC 81 MG tablet Take 81  mg by mouth at bedtime.   atorvastatin 40 MG tablet Commonly known as: LIPITOR Take 40 mg by mouth at bedtime.   carvedilol 6.25 MG tablet Commonly known as: COREG Take 6.25 mg by mouth 2 (two) times daily.   clopidogrel 75 MG tablet Commonly known as: PLAVIX Take 75 mg by mouth daily.   folic acid 1 MG tablet Commonly known as: FOLVITE Take 1 tablet (1 mg total) by mouth daily. Start taking on: June 06, 2022   gabapentin 300 MG capsule Commonly known as: NEURONTIN Take 600 mg by mouth 3 (three) times daily.   Ipratropium-Albuterol 20-100 MCG/ACT Aers respimat Commonly known as: COMBIVENT Inhale 1 puff into the lungs every 6 (six) hours as needed for wheezing.   isosorbide mononitrate 30 MG 24 hr tablet Commonly known as: IMDUR Take 1 tablet (30 mg total) by mouth daily. What changed:  medication strength how much to take   latanoprost 0.005 % ophthalmic solution Commonly known as: XALATAN Place 1 drop into the right eye at bedtime.   losartan 100 MG tablet Commonly known as: COZAAR Take 100 mg by mouth daily.   melatonin 5 MG Tabs Take 5 mg by mouth at bedtime.   nitroGLYCERIN 0.4 MG SL tablet Commonly known as: NITROSTAT Place 1 tablet (0.4 mg  total) under the tongue every 5 (five) minutes as needed for chest pain.   pantoprazole 40 MG tablet Commonly known as: PROTONIX Take 1 tablet (40 mg total) by mouth daily. Start taking on: June 06, 2022 Replaces: omeprazole 20 MG capsule        Procedures/Studies: ECHOCARDIOGRAM COMPLETE  Result Date: 06/02/2022    ECHOCARDIOGRAM REPORT   Patient Name:   Timothy Galvan Date of Exam: 06/02/2022 Medical Rec #:  [id number]       Height:       63.0 in Accession #:    [id number]      Weight:       140.0 lb Date of Birth:  Apr 22, 1956       BSA:          1.662 m Patient Age:    82 years        BP:           157/76 mmHg Patient Gender: M               HR:           54 bpm. Exam Location:  [facility] Procedure: 2D Echo, Cardiac Doppler and Color Doppler Indications:    Stroke  History:        Patient has no prior history of Echocardiogram examinations.                 CAD, Stroke, PAD and COPD, Signs/Symptoms:Chest Pain; Risk                 Factors:Dyslipidemia. Colon CA.  Sonographer:    Wenda Low Referring Phys: [place]  1. Left ventricular ejection fraction, by estimation, is 45 to 50%. The left ventricle has mildly decreased function. The left ventricle demonstrates regional wall motion abnormalities (see scoring diagram/findings for description). There is mild concentric left ventricular hypertrophy. Left ventricular diastolic parameters were normal.  2. Right ventricular systolic function is normal. The right ventricular size is normal. There is mildly elevated pulmonary artery systolic pressure. The estimated right ventricular systolic pressure is 46.2 mmHg.  3. Left atrial size was mildly dilated.  4. Right atrial size  was mildly dilated.  5. The mitral valve is grossly normal. Mild mitral valve regurgitation.  6. The aortic valve is tricuspid. Aortic valve regurgitation is not visualized. Aortic valve sclerosis/calcification is present, without any evidence of  aortic stenosis. Aortic valve mean gradient measures 3.0 mmHg.  7. The inferior vena cava is normal in size with greater than 50% respiratory variability, suggesting right atrial pressure of 3 mmHg. Comparison(s): Prior images unable to be directly viewed. Patient with reported history of cardiomyopathy followed at [facility] (fluctuating LVEF, 45% in 2020 and 55% in 2022). FINDINGS  Left Ventricle: Left ventricular ejection fraction, by estimation, is 45 to 50%. The left ventricle has mildly decreased function. The left ventricle demonstrates regional wall motion abnormalities. The left ventricular internal cavity size was normal in size. There is mild concentric left ventricular hypertrophy. Left ventricular diastolic parameters were normal.  LV Wall Scoring: The antero-lateral wall, posterior wall, and basal inferior segment are hypokinetic. The entire anterior wall, entire septum, entire apex, and mid and distal inferior wall are normal. Right Ventricle: The right ventricular size is normal. No increase in right ventricular wall thickness. Right ventricular systolic function is normal. There is mildly elevated pulmonary artery systolic pressure. The tricuspid regurgitant velocity is 3.22  m/s, and with an assumed right atrial pressure of 3 mmHg, the estimated right ventricular systolic pressure is 82.5 mmHg. Left Atrium: Left atrial size was mildly dilated. Right Atrium: Right atrial size was mildly dilated. Prominent Chiari network. Pericardium: There is no evidence of pericardial effusion. Mitral Valve: The mitral valve is grossly normal. Mild mitral valve regurgitation. MV peak gradient, 2.3 mmHg. The mean mitral valve gradient is 1.0 mmHg. Tricuspid Valve: The tricuspid valve is grossly normal. Tricuspid valve regurgitation is mild. Aortic Valve: The aortic valve is tricuspid. There is mild to moderate aortic valve annular calcification. Aortic valve regurgitation is not visualized. Aortic valve  sclerosis/calcification is present, without any evidence of aortic stenosis. Aortic valve mean gradient measures 3.0 mmHg. Aortic valve peak gradient measures 6.2 mmHg. Aortic valve area, by VTI measures 2.23 cm. Pulmonic Valve: The pulmonic valve was grossly normal. Pulmonic valve regurgitation is trivial. Aorta: The aortic root is normal in size and structure. Venous: The inferior vena cava is normal in size with greater than 50% respiratory variability, suggesting right atrial pressure of 3 mmHg. IAS/Shunts: No atrial level shunt detected by color flow Doppler.  LEFT VENTRICLE PLAX 2D LVIDd:         4.90 cm     Diastology LVIDs:         4.00 cm     LV e' medial:    8.19 cm/s LV PW:         1.20 cm     LV E/e' medial:  8.0 LV IVS:        1.20 cm     LV e' lateral:   10.20 cm/s LVOT diam:     2.00 cm     LV E/e' lateral: 6.4 LV SV:         67 LV SV Index:   40 LVOT Area:     3.14 cm  LV Volumes (MOD) LV vol d, MOD A2C: 72.5 ml LV vol d, MOD A4C: 76.5 ml LV vol s, MOD A2C: 47.3 ml LV vol s, MOD A4C: 49.8 ml LV SV MOD A2C:     25.2 ml LV SV MOD A4C:     76.5 ml LV SV MOD BP:  26.9 ml RIGHT VENTRICLE RV Basal diam:  3.60 cm RV Mid diam:    2.50 cm RV S prime:     12.30 cm/s TAPSE (M-mode): 2.4 cm LEFT ATRIUM             Index        RIGHT ATRIUM           Index LA diam:        4.00 cm 2.41 cm/m   RA Area:     19.70 cm LA Vol (A2C):   60.4 ml 36.35 ml/m  RA Volume:   60.90 ml  36.65 ml/m LA Vol (A4C):   64.3 ml 38.70 ml/m LA Biplane Vol: 62.1 ml 37.37 ml/m  AORTIC VALVE                    PULMONIC VALVE AV Area (Vmax):    2.17 cm     PV Vmax:       0.64 m/s AV Area (Vmean):   2.21 cm     PV Peak grad:  1.6 mmHg AV Area (VTI):     2.23 cm AV Vmax:           124.00 cm/s AV Vmean:          75.600 cm/s AV VTI:            0.298 m AV Peak Grad:      6.2 mmHg AV Mean Grad:      3.0 mmHg LVOT Vmax:         85.80 cm/s LVOT Vmean:        53.300 cm/s LVOT VTI:          0.212 m LVOT/AV VTI ratio: 0.71  AORTA Ao Root  diam: 3.40 cm MITRAL VALVE               TRICUSPID VALVE MV Area (PHT): 3.08 cm    TR Peak grad:   41.5 mmHg MV Area VTI:   2.57 cm    TR Vmax:        322.00 cm/s MV Peak grad:  2.3 mmHg MV Mean grad:  1.0 mmHg    SHUNTS MV Vmax:       0.75 m/s    Systemic VTI:  0.21 m MV Vmean:      31.2 cm/s   Systemic Diam: 2.00 cm MV Decel Time: 246 msec MV E velocity: 65.60 cm/s MV A velocity: 20.20 cm/s MV E/A ratio:  3.25 Rozann Lesches MD Electronically signed by Rozann Lesches MD Signature Date/Time: 06/02/2022/11:04:26 AM    Final    CT ANGIO NECK W OR WO CONTRAST  Result Date: 06/01/2022 CLINICAL DATA:  Dizziness and stuttering for 3 days EXAM: CT ANGIOGRAPHY NECK TECHNIQUE: Multidetector CT imaging of the neck was performed using the standard protocol during bolus administration of intravenous contrast. Multiplanar CT image reconstructions and MIPs were obtained to evaluate the vascular anatomy. Carotid stenosis measurements (when applicable) are obtained utilizing NASCET criteria, using the distal internal carotid diameter as the denominator. RADIATION DOSE REDUCTION: This exam was performed according to the departmental dose-optimization program which includes automated exposure control, adjustment of the mA and/or kV according to patient size and/or use of iterative reconstruction technique. CONTRAST:  46m OMNIPAQUE IOHEXOL 350 MG/ML SOLN COMPARISON:  03/07/2022 CTA head and neck FINDINGS: Aortic arch: Standard branching. Imaged portion shows no evidence of aneurysm or dissection. No significant stenosis of the major arch vessel origins. Aortic atherosclerosis. Right carotid  system: Redemonstrated occlusion of the right ICA at its origin,, similar to the prior exam. Left carotid system: Multifocal atherosclerosis in the left common and internal carotid artery, without hemodynamically significant stenosis, dissection or, or occlusion. Vertebral arteries: Scattered atherosclerotic calcifications, without  hemodynamically significant stenosis, dissection, or occlusion. Skeleton: No acute osseous abnormality. Degenerative changes in the cervical spine. Other neck: Negative. Upper chest: Emphysema.  Right apical bullae.  No pleural effusion. IMPRESSION: 1. 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 2. No other hemodynamically significant stenosis in the neck. 3. Aortic atherosclerosis. Aortic Atherosclerosis (ICD10-I70.0). Electronically Signed   By: Merilyn Baba M.D.   On: 06/01/2022 21:42   MR Brain W and Wo Contrast  Result Date: 06/01/2022 CLINICAL DATA:  Stroke follow-up. Altered mental status, abnormal head CT. EXAM: MRI HEAD WITHOUT AND WITH CONTRAST MRA HEAD WITHOUT CONTRAST TECHNIQUE: Multiplanar, multi-echo pulse sequences of the brain and surrounding structures were acquired without and with intravenous contrast. Angiographic images of the Circle of Willis were acquired using MRA technique without intravenous contrast. CONTRAST:  87m GADAVIST GADOBUTROL 1 MMOL/ML IV SOLN COMPARISON:  Same-day head CT, brain MRI 03/08/2022, CTA head/neck 03/08/2019 FINDINGS: MRI HEAD FINDINGS Brain: There is diffusion restriction with associated T2/FLAIR signal abnormality in the left lentiform nucleus extending to the caudate body as well as in the right aspect of the pons consistent with acute to subacute infarcts. There is no associated hemorrhage or mass effect. There is no other evidence of acute infarct There is no acute intracranial hemorrhage or extra-axial fluid collection. Parenchymal volume is stable. The ventricles are stable in size compared to the prior brain MRI. 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 There is no mass lesion. There is no abnormal enhancement. There is no mass effect or midline shift. Vascular:  The right ICA flow void remains abnormal, unchanged. The vasculature is assessed in full below. Skull and upper cervical spine: Normal marrow signal. Sinuses/Orbits: The paranasal sinuses are clear. The globes and orbits are unremarkable. Other: None. MRA HEAD FINDINGS Anterior circulation: There is unchanged occlusion of the right ICA throughout its imaged course with reconstitution of flow in the communicating segment. There is atherosclerotic irregularity of the left intracranial ICA with up to severe narrowing, similar to the prior study. There is focal moderate to severe stenosis of the distal right M1 segment (1009-8). The MCAs are otherwise patent, without other proximal high-grade stenosis or occlusion. The bilateral ACAs are patent, with overall mild multifocal atherosclerotic irregularity and narrowing but no high-grade stenosis or occlusion. There is no aneurysm or AVM. Posterior circulation: The bilateral V4 segments are patent to the level imaged. The imaged cerebellar arteries appear patent. The bilateral PCAs are patent proximally. There is short-segment severe stenosis of the left P2/P3 segment (1027-14). Bilateral posterior communicating arteries are identified. There is no aneurysm or AVM. Anatomic variants: None. IMPRESSION: 1. 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 2. 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 3. 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 4. 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 5. Focal moderate to severe stenosis of the distal right M1 segment and short-segment severe stenosis of the  left P2/P3 segment. Electronically Signed   By: Valetta Mole M.D.   On: 06/01/2022 16:10   MR ANGIO HEAD WO CONTRAST  Result Date:  06/01/2022 CLINICAL DATA:  Stroke follow-up. Altered mental status, abnormal head CT. EXAM: MRI HEAD WITHOUT AND WITH CONTRAST MRA HEAD WITHOUT CONTRAST TECHNIQUE: Multiplanar, multi-echo pulse sequences of the brain and surrounding structures were acquired without and with intravenous contrast. Angiographic images of the Circle of Willis were acquired using MRA technique without intravenous contrast. CONTRAST:  2m GADAVIST GADOBUTROL 1 MMOL/ML IV SOLN COMPARISON:  Same-day head CT, brain MRI 03/08/2022, CTA head/neck 03/08/2019 FINDINGS: MRI HEAD FINDINGS Brain: There is diffusion restriction with associated T2/FLAIR signal abnormality in the left lentiform nucleus extending to the caudate body as well as in the right aspect of the pons consistent with acute to subacute infarcts. There is no associated hemorrhage or mass effect. There is no other evidence of acute infarct There is no acute intracranial hemorrhage or extra-axial fluid collection. Parenchymal volume is stable. The ventricles are stable in size compared to the prior brain MRI. 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 There is no mass lesion. There is no abnormal enhancement. There is no mass effect or midline shift. Vascular: The right ICA flow void remains abnormal, unchanged. The vasculature is assessed in full below. Skull and upper cervical spine: Normal marrow signal. Sinuses/Orbits: The paranasal sinuses are clear. The globes and orbits are unremarkable. Other: None. MRA HEAD FINDINGS Anterior circulation: There is unchanged occlusion of the right ICA throughout its imaged course with reconstitution of flow in the communicating segment. There is atherosclerotic irregularity of the left intracranial ICA with up to severe narrowing, similar to the prior study.  There is focal moderate to severe stenosis of the distal right M1 segment (1009-8). The MCAs are otherwise patent, without other proximal high-grade stenosis or occlusion. The bilateral ACAs are patent, with overall mild multifocal atherosclerotic irregularity and narrowing but no high-grade stenosis or occlusion. There is no aneurysm or AVM. Posterior circulation: The bilateral V4 segments are patent to the level imaged. The imaged cerebellar arteries appear patent. The bilateral PCAs are patent proximally. There is short-segment severe stenosis of the left P2/P3 segment (1027-14). Bilateral posterior communicating arteries are identified. There is no aneurysm or AVM. Anatomic variants: None. IMPRESSION: 1. 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 2. 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 3. 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 4. 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 5. Focal moderate to severe stenosis of the distal right M1 segment and short-segment severe stenosis of the left P2/P3 segment. Electronically Signed   By: PValetta MoleM.D.   On: 06/01/2022 16:10   CT Head Wo Contrast  Result Date: 06/01/2022 CLINICAL DATA:  Altered mental status, dizziness EXAM: CT HEAD WITHOUT CONTRAST TECHNIQUE: Contiguous axial images were obtained from the base of the skull through the vertex without intravenous contrast. RADIATION DOSE REDUCTION: This exam was performed according to the departmental dose-optimization program which includes automated exposure control, adjustment of the mA and/or kV according to patient size and/or use of iterative reconstruction technique. COMPARISON:  03/07/2022 FINDINGS: Brain: There  are no signs of bleeding within the cranium.  There is 1.5 cm area of new low-attenuation in the left basal ganglia. Cortical sulci are prominent. Vascular: Unremarkable. Skull: Unremarkable. Sinuses/Orbits: Unremarkable. Other: None. IMPRESSION: There are no signs of bleeding within the cranium. There is new 1.5 cm low-density in left basal ganglia suggesting acute/subacute or old infarct. If clinically warranted, follow-up MRI may be considered. Electronically Signed   By: Elmer Picker M.D.   On: 06/01/2022 14:37   DG Chest 1 View  Result Date: 06/01/2022 CLINICAL DATA:  Dizziness for 3 days with shortness of breath. EXAM: CHEST  1 VIEW COMPARISON:  Radiographs 02/04/2019 and 01/16/2019.  CT 01/10/2018. FINDINGS: 1345 hours. The heart size and mediastinal contours are stable. Asymmetric bullous emphysematous changes at the right lung apex are again noted. There is no edema, confluent airspace opacity, pleural effusion or pneumothorax. No acute osseous findings are evident. There is an old fracture of the mid right clavicle. IMPRESSION: No evidence of acute cardiopulmonary process. Chronic obstructive pulmonary disease. Electronically Signed   By: Richardean Sale M.D.   On: 06/01/2022 13:52     Subjective: Pt reports feeling a little better, he is agreeable to CIR rehab   Discharge Exam: Vitals:   06/05/22 0624 06/05/22 0817  BP: (!) 164/65   Pulse: (!) 46   Resp: 20   Temp: 98.3 F (36.8 C)   SpO2: 95% 98%   Vitals:   06/04/22 2042 06/04/22 2227 06/05/22 0624 06/05/22 0817  BP: (!) 155/80 136/80 (!) 164/65   Pulse: 62 86 (!) 46   Resp: '18 18 20   '$ Temp: 98.2 F (36.8 C) 98 F (36.7 C) 98.3 F (36.8 C)   TempSrc: Axillary     SpO2: 100% (!) 86% 95% 98%  Weight:      Height:       General exam: speech remains difficult to understand.  Appears calm and comfortable  Respiratory system: Clear to auscultation. Respiratory effort normal. Cardiovascular system: normal S1 & S2 heard. No JVD, murmurs, rubs, gallops or clicks.  No pedal edema. Gastrointestinal system: Abdomen is nondistended, soft and nontender. No organomegaly or masses felt. Normal bowel sounds heard. Central nervous system: Alert and oriented. Left hemiparesis noted. Extremities: Symmetric 5 x 5 power. Skin: No rashes, lesions or ulcers. Psychiatry: Judgement and insight appear poor. Mood & affect appropriate.    The results of significant diagnostics from this hospitalization (including imaging, microbiology, ancillary and laboratory) are listed below for reference.     Microbiology: No results found for this or any previous visit (from the past 240 hour(s)).   Labs: BNP (last 3 results) No results for input(s): "BNP" in the last 8760 hours. Basic Metabolic Panel: Recent Labs  Lab 06/01/22 1431 06/04/22 0420 06/05/22 0423  NA 143 139 143  K 3.3* 2.9* 3.6  CL 114* 108 110  CO2 '24 26 25  '$ GLUCOSE 119* 107* 100*  BUN '13 13 9  '$ CREATININE 0.90 1.02 1.02  CALCIUM 8.9 8.6* 9.3  MG  --  1.6* 2.1  PHOS  --  3.2  --    Liver Function Tests: Recent Labs  Lab 06/01/22 1431  AST 24  ALT 24  ALKPHOS 103  BILITOT 0.3  PROT 6.9  ALBUMIN 3.6   No results for input(s): "LIPASE", "AMYLASE" in the last 168 hours. No results for input(s): "AMMONIA" in the last 168 hours. CBC: Recent Labs  Lab 06/01/22 1431  WBC 6.1  NEUTROABS 1.9  HGB 13.0  HCT 41.9  MCV 93.9  PLT 242   Cardiac Enzymes: No results for input(s): "CKTOTAL", "CKMB", "CKMBINDEX", "TROPONINI" in the last 168 hours. BNP: Invalid input(s): "POCBNP" CBG: Recent Labs  Lab 06/01/22 1433  GLUCAP 128*   D-Dimer No results for input(s): "DDIMER" in the last 72 hours. Hgb A1c No results for input(s): "HGBA1C" in the last 72 hours. Lipid Profile No results for input(s): "CHOL", "HDL", "LDLCALC", "TRIG", "CHOLHDL", "LDLDIRECT" in the last 72 hours. Thyroid function studies No results for input(s): "TSH", "T4TOTAL", "T3FREE", "THYROIDAB" in the last 72  hours.  Invalid input(s): "FREET3" Anemia work up No results for input(s): "VITAMINB12", "FOLATE", "FERRITIN", "TIBC", "IRON", "RETICCTPCT" in the last 72 hours. Urinalysis    Component Value Date/Time   COLORURINE COLORLESS (A) 06/02/2022 1500   APPEARANCEUR CLEAR 06/02/2022 1500   LABSPEC 1.004 (L) 06/02/2022 1500   PHURINE 6.0 06/02/2022 1500   GLUCOSEU 50 (A) 06/02/2022 1500   HGBUR NEGATIVE 06/02/2022 1500   BILIRUBINUR NEGATIVE 06/02/2022 1500   KETONESUR NEGATIVE 06/02/2022 1500   PROTEINUR NEGATIVE 06/02/2022 1500   NITRITE NEGATIVE 06/02/2022 1500   LEUKOCYTESUR NEGATIVE 06/02/2022 1500   Sepsis Labs Recent Labs  Lab 06/01/22 1431  WBC 6.1   Microbiology No results found for this or any previous visit (from the past 240 hour(s)).  Time coordinating discharge: 44 mins   SIGNED:  Irwin Brakeman, MD  [facility] 06/05/2022, 11:10 AM How to contact the [facility] Attending or Consulting provider [place] or covering provider during after hours [place], for this patient?  Check the care team in [facility] and look for a) attending/consulting TR[facility] provider listed and b) the [facility] team listed Log into [email or web address] and use [place]'s universal password to access. If you do not have the password, please contact the hospital operator. Locate the [facility] provider you are looking for under [facility] and page to a number that you can be directly reached. If you still have difficulty reaching the provider, please page the [facility] (Director on Call) for the Hospitalists listed on amion for assistance.

## 2022-06-05 NOTE — Progress Notes (Signed)
Report called to [place] @ CIR, report given to [facility]. Awaiting transport.

## 2022-06-05 NOTE — H&P (Shared)
Physical Medicine and Rehabilitation Admission H&P    Chief Complaint  Patient presents with   Stroke with functional deficits    HPI: Timothy Galvan is a 66 year old male with history of CAD, colon CA, PAD s/p FFBG, polysubstance abuse (cocaine, ETOH, tobacco), recent TIA 02/2022;  who was admitted on 06/01/22 to [facility] with 3 day history of dizziness, balance problems with difficulty walking, wheezing and stuttering. UDS positive for cocaine. CTA head/neck was negative for LVO. MRI/MRA brain done revealing acute to subacute infarcts in left basal ganglia and right pons without hemorrhage, unchanged occlusion of R-ICA and unchanged severe stenosis L-ICA with focal moderate to severe stenosis of distal right M1 segment and short segment severe stenosis of L P2/P3 segment. 2D echo done revealing mild decrease in LVF 45-50% with mild decrease in function and mild concentric LVH.  Stroke felt to be embolic and 30 day event monitor to be mailed to his home.   Dr. Hortense Ramal recommended DAPT X 3 months followed by ASA alone as well as following up with Dr. Dr. Eben Burow for input on stenting of L-ICA after discharge. BP has been labile and Losartan and Amlodipine added. Valium X 3 days due to ETOH abuse and CIWA protocol. He has had issues with wheezing on and off lately per reports and diet downgraded to D3, nectar liquids due to issues with coughing as well as recent teeth extractions w/sore mouth.  Electrolyte abnormalities supplemented. Patient noted to have dysarthria with low voice, expressive deficits, cognitive deficits with SLUMS 14/23 (has 7th grade education), weakness with balance deficits and  and    Review of Systems  Constitutional:  Negative for chills and fever.  HENT:  Negative for hearing loss and tinnitus.   Eyes:  Negative for blurred vision.  Respiratory:  Negative for cough.   Cardiovascular:  Negative for chest pain.  Gastrointestinal:  Negative for abdominal pain.   Genitourinary:  Negative for urgency.  Musculoskeletal:  Negative for back pain and neck pain.  Neurological:  Positive for dizziness, speech change and weakness.     Past Medical History:  Diagnosis Date   Asthma    Avascular necrosis of bone of hip ([place])    Blood clot in vein    Cancer ([place])    Coronary artery disease    GERD (gastroesophageal reflux disease)    Heart disease    Hypertension    Pneumonia     Past Surgical History:  Procedure Laterality Date   FEMORAL BYPASS     MANDIBLE SURGERY     WRIST SURGERY      History reviewed. No pertinent family history.   Social History:  reports that he has been smoking cigarettes. He has been smoking an average of 1 pack per day. He has never used smokeless tobacco. He reports current alcohol use of about 12.0 standard drinks of alcohol per week. He reports that he does not currently use drugs after having used the following drugs: Cocaine.   Allergies  Allergen Reactions   Penicillins     Has patient had a PCN reaction causing immediate rash, facial/tongue/throat swelling, SOB or lightheadedness with hypotension: Unknown Has patient had a PCN reaction causing severe rash involving mucus membranes or skin necrosis: Unknown Has patient had a PCN reaction that required hospitalization: Unknown Has patient had a PCN reaction occurring within the last 10 years: Unknown If all of the above answers are "NO", then may proceed with Cephalosporin use.  Medications Prior to Admission  Medication Sig Dispense Refill   aspirin EC 81 MG tablet Take 81 mg by mouth at bedtime.     atorvastatin (LIPITOR) 40 MG tablet Take 40 mg by mouth at bedtime.      carvedilol (COREG) 6.25 MG tablet Take 6.25 mg by mouth 2 (two) times daily.     clopidogrel (PLAVIX) 75 MG tablet Take 75 mg by mouth daily.     gabapentin (NEURONTIN) 300 MG capsule Take 600 mg by mouth 3 (three) times daily.     Ipratropium-Albuterol (COMBIVENT) 20-100 MCG/ACT  AERS respimat Inhale 1 puff into the lungs every 6 (six) hours as needed for wheezing.     isosorbide mononitrate (IMDUR) 120 MG 24 hr tablet Take 120 mg by mouth daily.     latanoprost (XALATAN) 0.005 % ophthalmic solution Place 1 drop into the right eye at bedtime.     losartan (COZAAR) 100 MG tablet Take 100 mg by mouth daily.     Melatonin 5 MG TABS Take 5 mg by mouth at bedtime.     nitroGLYCERIN (NITROSTAT) 0.4 MG SL tablet Place 1 tablet (0.4 mg total) under the tongue every 5 (five) minutes as needed for chest pain. 5 tablet 0   omeprazole (PRILOSEC) 20 MG capsule Take 20 mg by mouth at bedtime.      Saccharomyces boulardii (PROBIOTIC) 250 MG CAPS Take 1 capsule by mouth 3 (three) times daily. 60 capsule 0   acetaminophen (TYLENOL) 325 MG tablet Take 2 tablets (650 mg total) by mouth every 6 (six) hours as needed for mild pain (or Fever >/= 101). (Patient not taking: Reported on 02/04/2019)     alendronate (FOSAMAX) 70 MG tablet Take 1 tablet (70 mg total) by mouth once a week. Take with a full glass of water on an empty stomach. (Patient not taking: Reported on 01/16/2019) 4 tablet 0   clindamycin (CLEOCIN) 150 MG capsule Take 2 capsules (300 mg total) by mouth 3 (three) times daily. (Patient not taking: Reported on 06/04/2022) 42 capsule 0   HYDROcodone-acetaminophen (NORCO/VICODIN) 5-325 MG tablet Take 1 tablet by mouth every 6 (six) hours as needed for moderate pain. (Patient not taking: Reported on 06/04/2022) 15 tablet 0   ibuprofen (ADVIL) 600 MG tablet Take 600 mg by mouth every 4 (four) hours as needed. (Patient not taking: Reported on 06/04/2022)     meloxicam (MOBIC) 15 MG tablet Take 1 tablet (15 mg total) by mouth daily. (Patient not taking: Reported on 01/16/2019) 30 tablet 0   metoprolol tartrate (LOPRESSOR) 25 MG tablet Take 1 tablet (25 mg total) by mouth 2 (two) times daily. (Patient not taking: Reported on 06/04/2022) 60 tablet 0   traMADol (ULTRAM) 50 MG tablet Take 1 tablet (50  mg total) by mouth every 6 (six) hours as needed. (Patient not taking: Reported on 06/04/2022) 8 tablet 0      Home: Home Living Family/patient expects to be discharged to:: Private residence Living Arrangements: Alone Available Help at Discharge: Family, Available PRN/intermittently Type of Home: Apartment Home Access: Level entry Home Layout: One level Bathroom Shower/Tub: Chiropodist: Standard Bathroom Accessibility: Yes Home Equipment: Rollator (4 wheels), Rolling Walker (2 wheels), Cane - single point, Grab bars - tub/shower, Civil engineer, contracting  Lives With: Alone   Functional History: Prior Function Prior Level of Function : Needs assist ADLs (physical): IADLs Mobility Comments: 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 Pt  does not drive and uses electric scooter at grocery store ADLs Comments: Independent with ADLs, reports he needs some assistance with iADLs but does not currently have help  Functional Status:  Mobility: Bed Mobility Overal bed mobility: Needs Assistance Bed Mobility: Supine to Sit, Sit to Supine Supine to sit: Supervision Sit to supine: Supervision General bed mobility comments: Slightly labored. Transfers Overall transfer level: Needs assistance Equipment used: Rolling walker (2 wheels) Transfers: Sit to/from Stand Sit to Stand: Min guard, Min assist Bed to/from chair/wheelchair/BSC transfer type:: Step pivot Step pivot transfers: Min guard General transfer comment: Pt needing min G to min A to boost from EOB. Pt able to step and pivot for transfer with min G assist. Continues to be unstable but physical assist not needed today. Ambulation/Gait Ambulation/Gait assistance: Min guard Gait Distance (Feet): 35 Feet Assistive device: Rolling walker (2 wheels) Gait Pattern/deviations: Decreased step length - right, Decreased step length - left, Decreased dorsiflexion - left, Decreased  dorsiflexion - right, Decreased stride length General Gait Details: patient with slowed cadence, able to ambulate in hallway with min guard/RW, limited mostly due to fatigue and c/o dizziness Gait velocity: slow    ADL: ADL Overall ADL's : Needs assistance/impaired Eating/Feeding: Supervision/ safety, Sitting, Modified independent Grooming: Wash/dry hands, Wash/dry face, Oral care, Standing, Min guard Grooming Details (indicate cue type and reason): Pt able to stand at the sink to complete grooming tasks with RW and min G assist for ~10 minutes. Upper Body Bathing: Set up, Sitting Lower Body Bathing: Min guard, Sitting/lateral leans Upper Body Dressing : Set up, Sitting Lower Body Dressing: Supervision/safety, Sitting/lateral leans Lower Body Dressing Details (indicate cue type and reason): Pt able to doff and don socks seated at EOB with extended time only to don L sock. Toilet Transfer: Minimal assistance, Rolling walker (2 wheels), Ambulation Toilet Transfer Details (indicate cue type and reason): Simulated via EOB to chair with RW and ambulation in hall and back. Toileting- Clothing Manipulation and Hygiene: Set up, Sitting/lateral lean Tub/ Shower Transfer: Minimal assistance, Ambulation, Rolling walker (2 wheels) Functional mobility during ADLs: Min guard, Rolling walker (2 wheels) General ADL Comments: Pt able to ambulate in room with RW to sink and back with min G assist. Pt also wanted to take a few steps without the RW which he did with min G assist but increased instability.  Cognition: Cognition Overall Cognitive Status: Within Functional Limits for tasks assessed Arousal/Alertness: Awake/alert Orientation Level: Oriented X4 Year: 2023 Month: November Day of Week: Correct Attention: Sustained Sustained Attention: Impaired Sustained Attention Impairment: Verbal basic, Functional basic Memory: Impaired Memory Impairment: Decreased short term memory, Decreased recall of  new information, Retrieval deficit Decreased Short Term Memory: Verbal basic, Functional basic Awareness: Appears intact Problem Solving: Appears intact Behaviors: Perseveration Safety/Judgment: Appears intact Cognition Arousal/Alertness: Awake/alert Behavior During Therapy: WFL for tasks assessed/performed Overall Cognitive Status: Within Functional Limits for tasks assessed   Blood pressure (!) 164/65, pulse (!) 46, temperature 98.3 F (36.8 C), resp. rate 20, height '5\' 3"'$  (1.6 m), weight 63.5 kg, SpO2 98 %. Physical Exam Vitals and nursing note reviewed.  Constitutional:      Appearance: Normal appearance.  Skin:    General: Skin is warm and dry.  Neurological:     Mental Status: He is alert and oriented to person, place, and time.     Comments: Moderate to severe dysarthria with mild right sided weakness. Decrease in fine motor control RUE. Had difficulty following one step commands consistently.  Results for orders placed or performed during the hospital encounter of 06/01/22 (from the past 48 hour(s))  Basic metabolic panel     Status: Abnormal   Collection Time: 06/04/22  4:20 AM  Result Value Ref Range   Sodium 139 135 - 145 mmol/L   Potassium 2.9 (L) 3.5 - 5.1 mmol/L   Chloride 108 98 - 111 mmol/L   CO2 26 22 - 32 mmol/L   Glucose, Bld 107 (H) 70 - 99 mg/dL    Comment: Glucose reference range applies only to samples taken after fasting for at least 8 hours.   BUN 13 8 - 23 mg/dL   Creatinine, Ser 1.02 0.61 - 1.24 mg/dL   Calcium 8.6 (L) 8.9 - 10.3 mg/dL   GFR, Estimated >60 >60 mL/min    Comment: (NOTE) Calculated using the CKD-EPI Creatinine Equation (2021)    Anion gap 5 5 - 15    Comment: Performed at [facility], [address] [place], [place] [address]  Magnesium     Status: Abnormal   Collection Time: 06/04/22  4:20 AM  Result Value Ref Range   Magnesium 1.6 (L) 1.7 - 2.4 mg/dL    Comment: Performed at [facility], [address],  [place], [place] [address]  Phosphorus     Status: None   Collection Time: 06/04/22  4:20 AM  Result Value Ref Range   Phosphorus 3.2 2.5 - 4.6 mg/dL    Comment: Performed at [facility], [address], [place], [place] [address]  Magnesium     Status: None   Collection Time: 06/05/22  4:23 AM  Result Value Ref Range   Magnesium 2.1 1.7 - 2.4 mg/dL    Comment: Performed at [facility], [address], [place], [place] [address]  Basic metabolic panel     Status: Abnormal   Collection Time: 06/05/22  4:23 AM  Result Value Ref Range   Sodium 143 135 - 145 mmol/L   Potassium 3.6 3.5 - 5.1 mmol/L    Comment: DELTA CHECK NOTED   Chloride 110 98 - 111 mmol/L   CO2 25 22 - 32 mmol/L   Glucose, Bld 100 (H) 70 - 99 mg/dL    Comment: Glucose reference range applies only to samples taken after fasting for at least 8 hours.   BUN 9 8 - 23 mg/dL   Creatinine, Ser 1.02 0.61 - 1.24 mg/dL   Calcium 9.3 8.9 - 10.3 mg/dL   GFR, Estimated >60 >60 mL/min    Comment: (NOTE) Calculated using the CKD-EPI Creatinine Equation (2021)    Anion gap 8 5 - 15    Comment: Performed at [facility], [address], [place], [place] [address]   No results found.    Blood pressure (!) 164/65, pulse (!) 46, temperature 98.3 F (36.8 C), resp. rate 20, height '5\' 3"'$  (1.6 m), weight 63.5 kg, SpO2 98 %.  Medical Problem List and Plan: 1. Functional deficits secondary to ***  -patient may *** shower  -ELOS/Goals: *** 2.  H/o of DVT?/Antithrombotics: -DVT/anticoagulation:  Pharmaceutical: Lovenox   -antiplatelet therapy: Plavix/ASA X 3 months and is to follow with IR for stent.  3. Pain Management: Gabapentin 600 mg TID  --avoid narcotics with polysubstance hx--was not taking hydrocodone, ultram or mobic at home.  4. Mood/Behavior/Sleep: LCSW to follow for evaluation and support.  -antipsychotic agents: N/A 5. Neuropsych/cognition: This patient is capable of making decisions on his own behalf. 6. Skin/Wound Care:  Routine pressure relief measures  --  7. Fluids/Electrolytes/Nutrition: Monitor I/O. Encourage nectar thick fluid intake  --recheck BMET in am.  8. HTN: Monitor BP TID. Continue Norvasc, Hydralazine, Cozaar and  9. L-ICA stenosis: Continue DAPT w/Lipitor. Encourage tobacco cessation --Follow up with IVR or Vascular with [facility] after discharge.  10. CAD with HF/atypical CP: Heart healthy diet. Monitor wts daily  --Continue Hydralazine, Lipitor, Cozaar,   --continue to hold metoprolol (BRADYCARDIA) and Imdur-->avoid hypotension.  11. H/o Asthma: Stable on Incurse  MDI 12. Polysubstance abuse: Continues to use tobacco, ETOH and cocaine --Encourage cessation 13.  PAD s/p Fem-Fem BG:/stent distal aorta: Encourage tobacco cessation.  --on Lipitor and  14. Anxiety d/o: 15. Persistent hypokalemia: K+- 2.9 at admission and improved with supplementation yesterday. 16. Hypomagnesemia: Mg-1.6-->Improved with supplement yesterday --will start daily supplement as likely affecting K+ levels. 17. Dysphagia: Continue D2, nectar liquids. Encourage fluid intake. 18. ETOH withdrawal: Was on Valium X 3 days -->continue CIWA protocol  --On Thiamine and Folic acid.     ***  [name], PA-C 06/05/2022

## 2022-06-05 NOTE — Progress Notes (Signed)
Patient ID: Timothy Galvan, male   DOB: 1956-03-21, 66 y.o.   MRN: [id number] Met with the patient to review current situation, team conference, rehab process and plan of care. Discussed secondary risks including HTN, HLD, CAD, smoking/substance and ETOH misuse. Reviewed Dysphagia and dietary modification recommendations. Continue to follow along to discharge to address educational needs to facilitate preparation for discharge [name]

## 2022-06-05 NOTE — Progress Notes (Signed)
Physical Therapy Treatment Patient Details Name: Timothy Galvan MRN: [id number] DOB: 20-Apr-1956 Today's Date: 06/05/2022   History of Present Illness Timothy Galvan is a 66 y.o. male with medical history significant of Asthma, CAD, GERD, HTN, Colon cancer, and PAD. Pt presenting after 3 days acute onset of dizziness and stuttering. Feeling off balance w/ movement. Difficulty ambulating for past 2 days. Denies CP, SOB, DOE. Endorsing Wheezing offa and on lately. Pts son called EMS after pts stutterintg and dizziness did not resolve. Associated w/ increased fatigue over the last few days.    PT Comments    Patient demonstrates fair/good return for siting up at bedside with increased time, has to lean on nearby objects for support when completing transfers and taking steps without AD, safer using RW demonstrating slow labored cadence with mild leaning to the left without loss of balance, limited for ambulation mostly due to fatigue and tightness in calves.  Patient tolerated sitting up in chair after therapy.  Patient will benefit from continued skilled physical therapy in hospital and recommended venue below to increase strength, balance, endurance for safe ADLs and gait.    ARecommendations for follow up therapy are one component of a multi-disciplinary discharge planning process, led by the attending physician.  Recommendations may be updated based on patient status, additional functional criteria and insurance authorization.  Follow Up Recommendations  Acute inpatient rehab (3hours/day)     Assistance Recommended at Discharge Intermittent Supervision/Assistance  Patient can return home with the following A little help with walking and/or transfers;Assistance with cooking/housework;Help with stairs or ramp for entrance;A little help with bathing/dressing/bathroom   Equipment Recommendations  None recommended by PT    Recommendations for Other Services       Precautions / Restrictions  Precautions Precautions: Fall Restrictions Weight Bearing Restrictions: No     Mobility  Bed Mobility Overal bed mobility: Needs Assistance Bed Mobility: Supine to Sit     Supine to sit: Supervision Sit to supine: Supervision   General bed mobility comments: Slightly labored movement with increased time    Transfers Overall transfer level: Needs assistance Equipment used: Rolling walker (2 wheels) Transfers: Sit to/from Stand, Bed to chair/wheelchair/BSC Sit to Stand: Min guard, Min assist   Step pivot transfers: Min guard       General transfer comment: has to lean on armrest of chair for support when transferring without AD, safer using RW    Ambulation/Gait Ambulation/Gait assistance: Min guard Gait Distance (Feet): 50 Feet Assistive device: Rolling walker (2 wheels) Gait Pattern/deviations: Decreased step length - right, Decreased step length - left, Decreased dorsiflexion - left, Decreased dorsiflexion - right, Decreased stride length Gait velocity: decreased     General Gait Details: slightly labored cadence with mild leaning to the left without loss of balance and limited mostly due to c/o of tightness of calves and fatigue   Stairs             Wheelchair Mobility    Modified Rankin (Stroke Patients Only)       Balance Overall balance assessment: Needs assistance Sitting-balance support: Feet supported, No upper extremity supported Sitting balance-Leahy Scale: Good Sitting balance - Comments: seated EOB   Standing balance support: During functional activity, No upper extremity supported Standing balance-Leahy Scale: Poor Standing balance comment: fair/poor without AD, fair using RW                            Cognition Arousal/Alertness:  Awake/alert Behavior During Therapy: WFL for tasks assessed/performed Overall Cognitive Status: Within Functional Limits for tasks assessed                                           Exercises General Exercises - Lower Extremity Long Arc Quad: Seated, AROM, Strengthening, Both, 10 reps Hip Flexion/Marching: Seated, AROM, Strengthening, Both, 15 reps Toe Raises: Seated, AROM, Strengthening, Both, 10 reps Heel Raises: Seated, AROM, Strengthening, Both, 10 reps    General Comments General comments (skin integrity, edema, etc.): VSS on RA, at times pt appears mildly SOB      Pertinent Vitals/Pain Pain Assessment Pain Assessment: No/denies pain    Home Living                          Prior Function            PT Goals (current goals can now be found in the care plan section) Acute Rehab PT Goals Patient Stated Goal: return home after rehab PT Goal Formulation: With patient Time For Goal Achievement: 06/09/22 Potential to Achieve Goals: Good Progress towards PT goals: Progressing toward goals    Frequency    Min 4X/week      PT Plan Current plan remains appropriate    Co-evaluation PT/OT/SLP Co-Evaluation/Treatment: Yes Reason for Co-Treatment: To address functional/ADL transfers PT goals addressed during session: Mobility/safety with mobility;Balance;Proper use of DME;Strengthening/ROM        AM-PAC PT "6 Clicks" Mobility   Outcome Measure  Help needed turning from your back to your side while in a flat bed without using bedrails?: None Help needed moving from lying on your back to sitting on the side of a flat bed without using bedrails?: None Help needed moving to and from a bed to a chair (including a wheelchair)?: A Little Help needed standing up from a chair using your arms (e.g., wheelchair or bedside chair)?: A Little Help needed to walk in hospital room?: A Little Help needed climbing 3-5 steps with a railing? : A Lot 6 Click Score: 19    End of Session   Activity Tolerance: Patient tolerated treatment well;Patient limited by fatigue Patient left: in chair;with call bell/phone within reach;with chair alarm  set Nurse Communication: Mobility status PT Visit Diagnosis: Unsteadiness on feet (R26.81);Muscle weakness (generalized) (M62.81);Other abnormalities of gait and mobility (R26.89)     Time: 1540-0867 PT Time Calculation (min) (ACUTE ONLY): 17 min  Charges:  $Gait Training: 8-22 mins $Therapeutic Exercise: 8-22 mins                     11:24 AM, 06/05/22 Lonell Grandchild, MPT Physical Therapist with [facility] [phone] office [phone] mobile phone

## 2022-06-05 NOTE — Progress Notes (Signed)
Inpatient Rehab Admissions Coordinator:    I have a CIR bed for this Pt. And will admit today. RN may call report to [phone].  Clemens Catholic, [place], [place] Admissions Coordinator  [phone] ([place]) [phone] (office)

## 2022-06-05 NOTE — Progress Notes (Signed)
PMR Admission Coordinator Pre-Admission Assessment   Patient: Timothy Galvan is an 66 y.o., male MRN: [id number] DOB: 11/24/1955 Height: 5' 3" (1.6 m) Weight: 63.5 kg   Insurance Information HMO: yes    PPO:      PCP:      IPA:      80/20:      OTHER:  PRIMARY: [employer] Medicare      Policy#: [id number]      Subscriber: Pt CM Name:       Phone#: [phone]     Fax#: [phone] approved 11/21 for admit 11/22-12/5  Pre-Cert#: [id number]      Employer:  Benefits:  Phone #: portal     Name: Eff Date: 07/13/2021 - 07/12/2022 Deductible: does not have one OOP Max: $8,300 ($1,782.34 met) CIR: $1,556/ admission copay SNF: $0.00 Copayment per day for days 1-20; $200 Copayment per day for days  1-100; Maximum of 100 days/benefit period Outpatient: 80% coverage; 20% co-insurance Home Health:  100% coverage, limited by medical necessity DME: 80% coverage; 20% co-insurance   SECONDARY: Medicaid [place]       Policy#: [id number]       Phone#:    The "Data Collection Information Summary" for patients in Inpatient Rehabilitation Facilities with attached "Privacy Act Statement-Health Care Records" was provided and verbally reviewed with: Patient   Emergency Contact Information Contact Information       Name Relation Home Work Mobile    Brunetto,Betty Sister     [phone]    Petrey,[name] Daughter     [phone]    Kady,Jimmy Brother     [phone]           Current Medical History  Patient Admitting Diagnosis: CVA History of Present Illness: Timothy Galvan is a 66 y.o. male with medical history significant of Asthma, CAD, GERD, HTN, Colon cancer, PAD, and Cocaine use who presented to the [place] ED 06/01/22. Pt presented after 3 days of dizziness, and stuttering. Reported acute onset and feeling off balance w/ movement. Difficulty ambulating for 2 days PTA . CT head without contrast 06/01/2022 showed no signs of bleeding within the cranium, but a new 1.5 cm low-density in left basal  ganglia suggesting acute/subacute or old infarct. 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 No other hemodynamically significant stenosis in the neck. Aortic atherosclerosis. MRI brain with and without contrast 06/01/2022 revealing for acute to subacute infarcts in the left basal ganglia and right pons without hemorrhage or mass effect. 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 MRA of the head without contrast 06/01/2022 showed  unchanged occlusion of the right ICA from the neck through the communicating segment where there is reconstitution via collateral flow. 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 Focal moderate to severe stenosis of the distal right M1 segment and short-segment severe stenosis of the left P2/P3 segment. Echo showed Left ventricular ejection fraction, by estimation, is 45 to 50%.Right ventricular systolic function is normal.  PT. Seen by PT/OT who recommended CIR to assist return to PLOF.    Complete NIHSS TOTAL: 4   Patient's medical record from [facility] has been reviewed by the rehabilitation admission coordinator and physician.   Past Medical History      Past Medical History:  Diagnosis Date   Asthma     Avascular necrosis of bone of hip (HCC)     Blood clot   in vein     Cancer (HCC)     Coronary artery disease     GERD (gastroesophageal reflux disease)     Heart disease     Hypertension     Pneumonia        Has the patient had major surgery during 100 days prior to admission? Yes   Family History   family history is not on file.   Current Medications   Current Facility-Administered Medications:    acetaminophen (TYLENOL) tablet 650 mg, 650 mg, Oral, Q4H PRN, 650 mg at 06/03/22 0832 **OR** acetaminophen (TYLENOL) 160 MG/5ML solution 650 mg, 650 mg, Per Tube, Q4H PRN **OR** acetaminophen (TYLENOL)  suppository 650 mg, 650 mg, Rectal, Q4H PRN, [name], [name], MD   albuterol (PROVENTIL) (2.5 MG/3ML) 0.083% nebulizer solution 2.5 mg, 2.5 mg, Nebulization, Q2H PRN, Emokpae, [name], MD   aspirin EC tablet 81 mg, 81 mg, Oral, Daily, [name], [name], PA-C, 81 mg at 06/03/22 0828   atorvastatin (LIPITOR) tablet 40 mg, 40 mg, Oral, QHS, [name], [name], MD, 40 mg at 06/02/22 2319   clopidogrel (PLAVIX) tablet 75 mg, 75 mg, Oral, Daily, [name], [name], PA-C, 75 mg at 06/03/22 0828   diazepam (VALIUM) tablet 2 mg, 2 mg, Oral, TID, Emokpae, [name], MD, 2 mg at 06/03/22 0828   folic acid (FOLVITE) tablet 1 mg, 1 mg, Oral, Daily, Emokpae, [name], MD, 1 mg at 06/03/22 0828   gabapentin (NEURONTIN) capsule 600 mg, 600 mg, Oral, TID, [name], [name], MD, 600 mg at 06/03/22 0829   heparin injection 5,000 Units, 5,000 Units, Subcutaneous, Q8H, [name], [name], MD, 5,000 Units at 06/03/22 0544   hydrALAZINE (APRESOLINE) injection 10 mg, 10 mg, Intravenous, Q6H PRN, Emokpae, [name], MD, 10 mg at 06/02/22 1845   HYDROcodone-acetaminophen (NORCO/VICODIN) 5-325 MG per tablet 1 tablet, 1 tablet, Oral, Q6H PRN, [name], [name], MD, 1 tablet at 06/02/22 1034   latanoprost (XALATAN) 0.005 % ophthalmic solution 1 drop, 1 drop, Right Eye, QHS, [name], [name], MD, 1 drop at 06/02/22 2317   LORazepam (ATIVAN) tablet 1-4 mg, 1-4 mg, Oral, Q1H PRN **OR** LORazepam (ATIVAN) injection 1-4 mg, 1-4 mg, Intravenous, Q1H PRN, Emokpae, [name], MD, 2 mg at 06/02/22 1811   melatonin tablet 3 mg, 3 mg, Oral, QHS, [name], [name], MD, 3 mg at 06/02/22 2317   multivitamin with minerals tablet 1 tablet, 1 tablet, Oral, Daily, Emokpae, [name], MD, 1 tablet at 06/03/22 0828   nicotine (NICODERM CQ - dosed in mg/24 hours) patch 21 mg, 21 mg, Transdermal, Daily, [name], [name], MD, 21 mg at 06/03/22 0830   ondansetron (ZOFRAN) tablet 4 mg, 4 mg, Oral, Q6H PRN **OR** ondansetron (ZOFRAN) injection 4 mg, 4 mg,  Intravenous, Q6H PRN, [name], [name], MD   pantoprazole (PROTONIX) EC tablet 40 mg, 40 mg, Oral, Daily, [name], [name], MD, 40 mg at 06/03/22 0828   senna-docusate (Senokot-S) tablet 1 tablet, 1 tablet, Oral, QHS PRN, [name], [name], MD   thiamine (VITAMIN B1) tablet 100 mg, 100 mg, Oral, Daily, 100 mg at 06/03/22 0828 **OR** thiamine (VITAMIN B1) injection 100 mg, 100 mg, Intravenous, Daily, Emokpae, [name], MD   umeclidinium bromide (INCRUSE ELLIPTA) 62.5 MCG/ACT 1 puff, 1 puff, Inhalation, Daily, Emokpae, [name], MD, 1 puff at 06/03/22 0802 Patients Current Diet:  Diet Order                  DIET DYS 3 Room service appropriate? Yes; Fluid consistency: Nectar   Thick  Diet effective now                         Precautions / Restrictions Precautions Precautions: Fall Restrictions Weight Bearing Restrictions: No    Has the patient had 2 or more falls or a fall with injury in the past year? No   Prior Activity Level Community (5-7x/wk): Pt. active in the community PTA   Prior Functional Level Self Care: Did the patient need help bathing, dressing, using the toilet or eating? Independent   Indoor Mobility: Did the patient need assistance with walking from room to room (with or without device)? Independent   Stairs: Did the patient need assistance with internal or external stairs (with or without device)? Independent   Functional Cognition: Did the patient need help planning regular tasks such as shopping or remembering to take medications? Independent   Patient Information Are you of Hispanic, Latino/a,or Spanish origin?: A. No, not of Hispanic, Latino/a, or Spanish origin What is your race?: B. Black or African American Do you need or want an interpreter to communicate with a doctor or health care staff?: 0. No   Patient's Response To:  Health Literacy and Transportation Is the patient able to respond to health literacy and transportation needs?: Yes Health Literacy  - How often do you need to have someone help you when you read instructions, pamphlets, or other written material from your doctor or pharmacy?: Never In the past 12 months, has lack of transportation kept you from medical appointments or from getting medications?: No In the past 12 months, has lack of transportation kept you from meetings, work, or from getting things needed for daily living?: No   Home Assistive Devices / Equipment Home Equipment: Rollator (4 wheels), Rolling Walker (2 wheels), Cane - single point, Grab bars - tub/shower, Shower seat   Prior Device Use: Indicate devices/aids used by the patient prior to current illness, exacerbation or injury? None of the above   Current Functional Level Cognition   Arousal/Alertness: Awake/alert Overall Cognitive Status: No family/caregiver present to determine baseline cognitive functioning Orientation Level: Oriented X4 Attention: Sustained Sustained Attention: Impaired Sustained Attention Impairment: Verbal basic, Functional basic Memory: Impaired Memory Impairment: Decreased short term memory, Decreased recall of new information, Retrieval deficit Decreased Short Term Memory: Verbal basic, Functional basic Awareness: Appears intact Problem Solving: Appears intact Behaviors: Perseveration Safety/Judgment: Appears intact    Extremity Assessment (includes Sensation/Coordination)   Upper Extremity Assessment: Generalized weakness  Lower Extremity Assessment: Defer to PT evaluation LLE Deficits / Details: grossly weaker on left (4-/5 MMT) in all major muscles group, diminshed sensation L L5 dermatome LLE Sensation: decreased light touch     ADLs   Overall ADL's : Needs assistance/impaired Eating/Feeding: Supervision/ safety, Sitting, Modified independent Grooming: Wash/dry hands, Wash/dry face, Min guard, Standing Grooming Details (indicate cue type and reason): Completed at sink Upper Body Bathing: Set up, Sitting Lower Body  Bathing: Min guard, Sitting/lateral leans Upper Body Dressing : Set up, Sitting Lower Body Dressing: Supervision/safety, Sitting/lateral leans Lower Body Dressing Details (indicate cue type and reason): Pt able to doff and don socks seated at EOB with extended time only to don L sock. Toilet Transfer: Minimal assistance, Rolling walker (2 wheels), Ambulation Toilet Transfer Details (indicate cue type and reason): Min A for balance in standing from low surfaces Toileting- Clothing Manipulation and Hygiene: Minimal assistance, Sitting/lateral lean, Sit to/from stand Toileting - Clothing Manipulation Details (indicate cue type and reason): Min   A with balance when standing to complete pericare and if he had to pull up pants/underwear Tub/ Shower Transfer: Minimal assistance, Ambulation, Rolling walker (2 wheels) Functional mobility during ADLs: Min guard, Rolling walker (2 wheels) General ADL Comments: Pt leans to the L when he walks, which he is able to report, requiring min guard to min A for ADL's assessed this session     Mobility   Overal bed mobility: Needs Assistance Bed Mobility: Supine to Sit Supine to sit: Supervision Sit to supine: Supervision General bed mobility comments: Slightly labored.     Transfers   Overall transfer level: Needs assistance Equipment used: Rolling walker (2 wheels) Transfers: Sit to/from Stand Sit to Stand: Min guard, Min assist Bed to/from chair/wheelchair/BSC transfer type:: Step pivot Step pivot transfers: Min guard General transfer comment: Pt needing min G to min A to boost from EOB. Pt able to step and pivot for transfer with min G assist. Continues to be unstable.     Ambulation / Gait / Stairs / Wheelchair Mobility   Ambulation/Gait Ambulation/Gait assistance: Counsellor (Feet): 35 Feet Assistive device: Rolling walker (2 wheels) Gait Pattern/deviations: Decreased step length - right, Decreased step length - left, Decreased  dorsiflexion - left, Decreased dorsiflexion - right, Decreased stride length General Gait Details: patient with slowed cadence, able to ambulate in hallway with min guard/RW, limited mostly due to fatigue and c/o dizziness Gait velocity: slow     Posture / Balance Dynamic Sitting Balance Sitting balance - Comments: seated EOB Balance Overall balance assessment: Needs assistance Sitting-balance support: No upper extremity supported, Feet supported Sitting balance-Leahy Scale: Good Sitting balance - Comments: seated EOB Standing balance support: Bilateral upper extremity supported, During functional activity, Reliant on assistive device for balance Standing balance-Leahy Scale: Fair Standing balance comment: fair to poor with RW, leaning to the L     Special needs/care consideration Skin intact  and Special service needs none    Previous Home Environment (from acute therapy documentation) Living Arrangements: Alone  Lives With: Alone Available Help at Discharge: Family, Available PRN/intermittently Type of Home: Apartment Home Layout: One level Home Access: Level entry Bathroom Shower/Tub: Chiropodist: Standard Bathroom Accessibility: Yes How Accessible: Accessible via walker O'Brien: No   Discharge Living Setting Plans for Discharge Living Setting: Patient's home Type of Home at Discharge: House Discharge Home Layout: One level Discharge Home Access: Level entry Discharge Bathroom Shower/Tub: [place] unit Discharge Bathroom Toilet: Standard Discharge Bathroom Accessibility: Yes How Accessible: Accessible via walker   Social/Family/Support Systems Patient Roles: Other (Comment) Contact Information: [phone] Anticipated Caregiver: Hinton Dyer (daughter) is pulling together plan for Pt.'s sisters, nephew to rotate care Ability/Limitations of Caregiver: Min A Caregiver Availability: 24/7 Discharge Plan Discussed with Primary Caregiver: Yes Is  Caregiver In Agreement with Plan?: Yes Does Caregiver/Family have Issues with Lodging/Transportation while Pt is in Rehab?: No   Goals Patient/Family Goal for Rehab: PT/OT/SLP Expected length of stay: 5-7 days Pt/Family Agrees to Admission and willing to participate: Yes Program Orientation Provided & Reviewed with Pt/Caregiver Including Roles  & Responsibilities: No   Decrease burden of Care through IP rehab admission: not anticipated   Possible need for SNF placement upon discharge: not anticipated   Patient Condition: I have reviewed medical records from [facility], spoken with CM, and patient and daughter. I met with patient at the bedside for inpatient rehabilitation assessment.  Patient will benefit from ongoing PT, OT, and SLP, can actively participate in  3 hours of therapy a day 5 days of the week, and can make measurable gains during the admission.  Patient will also benefit from the coordinated team approach during an Inpatient Acute Rehabilitation admission.  The patient will receive intensive therapy as well as Rehabilitation physician, nursing, social worker, and care management interventions.  Due to safety, skin/wound care, disease management, medication administration, pain management, and patient education the patient requires 24 hour a day rehabilitation nursing.  The patient is currently Min A-min g with mobility and basic ADLs.  Discharge setting and therapy post discharge at home with home health is anticipated.  Patient has agreed to participate in the Acute Inpatient Rehabilitation Program and will admit today.   Preadmission Screen Completed By:  Laura B Staley, 06/05/2022 10:42 AM ______________________________________________________________________   Discussed status with Dr. Engler  on 06/05/22 at 930 and received approval for admission today.   Admission Coordinator:  Laura B Staley, CCC-SLP, time 1041/Date 06/05/22     Assessment/Plan: Diagnosis: Does the need for close, 24 hr/day Medical supervision in concert with the patient's rehab needs make it unreasonable for this patient to be served in a less intensive setting? Yes Co-Morbidities requiring supervision/potential complications: hemiparesis, aphasia, HFrEF, hypertension, PAD, polysubstance abuse w/ Hx withdrawals, COPD, electrolyte deficiencies. Due to safety, skin/wound care, disease management, medication administration, pain management, and patient education, does the patient require 24 hr/day rehab nursing? Yes Does the patient require coordinated care of a physician, rehab nurse, PT, OT, and SLP to address physical and functional deficits in the context of the above medical diagnosis(es)? Yes Addressing deficits in the following areas: balance, endurance, locomotion, strength, transferring, bathing, dressing, feeding, grooming, toileting, cognition, and psychosocial support Can the patient actively participate in an intensive therapy program of at least 3 hrs of therapy 5 days a week? Yes The potential for patient to make measurable gains while on inpatient rehab is good Anticipated functional outcomes upon discharge from inpatient rehab: supervision to Min A PT, supervision OT, supervision SLP Estimated rehab length of stay to reach the above functional goals is: 5-7 days Anticipated discharge destination: Home 10. Overall Rehab/Functional Prognosis: good     MD Signature:   Morgan C Engler, DO 06/05/2022  

## 2022-06-05 NOTE — H&P (Addendum)
Expand All Collapse All      Physical Medicine and Rehabilitation Admission H&P        Chief Complaint  Patient presents with   Stroke with functional deficits      HPI: Timothy Galvan is a 66 year old male with history of CAD, colon CA, PAD s/p FFBG, polysubstance abuse (cocaine, ETOH, tobacco), recent TIA 02/2022;  who was admitted on 06/01/22 to [facility] with 3 day history of dizziness, balance problems with difficulty walking, wheezing and stuttering. UDS positive for cocaine. CTA head/neck was negative for LVO. MRI/MRA brain done revealing acute to subacute infarcts in left basal ganglia and right pons without hemorrhage, unchanged occlusion of R-ICA and unchanged severe stenosis L-ICA with focal moderate to severe stenosis of distal right M1 segment and short segment severe stenosis of L P2/P3 segment. 2D echo done revealing mild decrease in LVF 45-50% with mild decrease in function and mild concentric LVH.  Stroke felt to be embolic and 30 day event monitor to be mailed to his home.    Dr. Hortense Ramal recommended DAPT X 3 months followed by ASA alone as well as following up with Dr. Dr. Eben Burow for input on stenting of L-ICA after discharge. BP has been labile and Losartan and Amlodipine added. Valium X 3 days due to ETOH abuse and CIWA protocol. He has had issues with wheezing on and off lately per reports and diet downgraded to D3, nectar liquids due to issues with coughing as well as recent teeth extractions w/sore mouth.  Electrolyte abnormalities supplemented. Patient noted to have dysarthria with low voice, expressive deficits, cognitive deficits with SLUMS 14/23 (has 7th grade education), weakness with balance deficits and  and      Review of Systems  Constitutional:  Negative for chills and fever.  HENT:  Negative for hearing loss and tinnitus.   Eyes:  Negative for blurred vision.  Respiratory:  Negative for cough.   Cardiovascular:  Negative for chest pain.   Gastrointestinal:  Negative for abdominal pain.  Genitourinary:  Negative for urgency.  Musculoskeletal:  Negative for back pain and neck pain.  Neurological:  Positive for dizziness, speech change and weakness.            Past Medical History:  Diagnosis Date   Asthma     Avascular necrosis of bone of hip ([place])     Blood clot in vein     Cancer ([place])     Coronary artery disease     GERD (gastroesophageal reflux disease)     Heart disease     Hypertension     Pneumonia             Past Surgical History:  Procedure Laterality Date   FEMORAL BYPASS       MANDIBLE SURGERY       WRIST SURGERY          History reviewed. No pertinent family history.     Social History:  reports that he has been smoking cigarettes. He has been smoking an average of 1 pack per day since 1 month; prior was 2 PPD. He has never used smokeless tobacco. He reports current alcohol use of about 12.0 standard drinks of alcohol per week. He reports that he does not currently use drugs after having used the following drugs: Cocaine.          Allergies  Allergen Reactions   Penicillins        Has patient had a PCN  reaction causing immediate rash, facial/tongue/throat swelling, SOB or lightheadedness with hypotension: Unknown Has patient had a PCN reaction causing severe rash involving mucus membranes or skin necrosis: Unknown Has patient had a PCN reaction that required hospitalization: Unknown Has patient had a PCN reaction occurring within the last 10 years: Unknown If all of the above answers are "NO", then may proceed with Cephalosporin use.            Medications Prior to Admission  Medication Sig Dispense Refill   aspirin EC 81 MG tablet Take 81 mg by mouth at bedtime.       atorvastatin (LIPITOR) 40 MG tablet Take 40 mg by mouth at bedtime.        carvedilol (COREG) 6.25 MG tablet Take 6.25 mg by mouth 2 (two) times daily.       clopidogrel (PLAVIX) 75 MG tablet Take 75 mg by mouth daily.        gabapentin (NEURONTIN) 300 MG capsule Take 600 mg by mouth 3 (three) times daily.       Ipratropium-Albuterol (COMBIVENT) 20-100 MCG/ACT AERS respimat Inhale 1 puff into the lungs every 6 (six) hours as needed for wheezing.       isosorbide mononitrate (IMDUR) 120 MG 24 hr tablet Take 120 mg by mouth daily.       latanoprost (XALATAN) 0.005 % ophthalmic solution Place 1 drop into the right eye at bedtime.       losartan (COZAAR) 100 MG tablet Take 100 mg by mouth daily.       Melatonin 5 MG TABS Take 5 mg by mouth at bedtime.       nitroGLYCERIN (NITROSTAT) 0.4 MG SL tablet Place 1 tablet (0.4 mg total) under the tongue every 5 (five) minutes as needed for chest pain. 5 tablet 0   omeprazole (PRILOSEC) 20 MG capsule Take 20 mg by mouth at bedtime.        Saccharomyces boulardii (PROBIOTIC) 250 MG CAPS Take 1 capsule by mouth 3 (three) times daily. 60 capsule 0   acetaminophen (TYLENOL) 325 MG tablet Take 2 tablets (650 mg total) by mouth every 6 (six) hours as needed for mild pain (or Fever >/= 101). (Patient not taking: Reported on 02/04/2019)       alendronate (FOSAMAX) 70 MG tablet Take 1 tablet (70 mg total) by mouth once a week. Take with a full glass of water on an empty stomach. (Patient not taking: Reported on 01/16/2019) 4 tablet 0   clindamycin (CLEOCIN) 150 MG capsule Take 2 capsules (300 mg total) by mouth 3 (three) times daily. (Patient not taking: Reported on 06/04/2022) 42 capsule 0   HYDROcodone-acetaminophen (NORCO/VICODIN) 5-325 MG tablet Take 1 tablet by mouth every 6 (six) hours as needed for moderate pain. (Patient not taking: Reported on 06/04/2022) 15 tablet 0   ibuprofen (ADVIL) 600 MG tablet Take 600 mg by mouth every 4 (four) hours as needed. (Patient not taking: Reported on 06/04/2022)       meloxicam (MOBIC) 15 MG tablet Take 1 tablet (15 mg total) by mouth daily. (Patient not taking: Reported on 01/16/2019) 30 tablet 0   metoprolol tartrate (LOPRESSOR) 25 MG tablet Take 1  tablet (25 mg total) by mouth 2 (two) times daily. (Patient not taking: Reported on 06/04/2022) 60 tablet 0   traMADol (ULTRAM) 50 MG tablet Take 1 tablet (50 mg total) by mouth every 6 (six) hours as needed. (Patient not taking: Reported on 06/04/2022) 8 tablet 0  Home: Home Living Family/patient expects to be discharged to:: Private residence Living Arrangements: Alone Available Help at Discharge: Family, Available PRN/intermittently Type of Home: Apartment Home Access: Level entry [place]: One level Bathroom Shower/Tub: Chiropodist: Standard Bathroom Accessibility: Yes Home Equipment: Rollator (4 wheels), [place] (2 wheels), Cane - single point, Grab bars - tub/shower, Civil engineer, contracting  Lives With: Alone   Functional History: Prior Function Prior Level of Function : Needs assist ADLs (physical): IADLs Mobility Comments: 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 Pt does not drive and uses electric scooter at grocery store ADLs Comments: Independent with ADLs, reports he needs some assistance with iADLs but does not currently have help   Functional Status:  Mobility: Bed Mobility Overal bed mobility: Needs Assistance Bed Mobility: Supine to Sit, Sit to Supine Supine to sit: Supervision Sit to supine: Supervision General bed mobility comments: Slightly labored. Transfers Overall transfer level: Needs assistance Equipment used: Rolling walker (2 wheels) Transfers: Sit to/from Stand Sit to Stand: Min guard, Min assist Bed to/from chair/wheelchair/BSC transfer type:: Step pivot Step pivot transfers: Min guard General transfer comment: Pt needing min G to min A to boost from EOB. Pt able to step and pivot for transfer with min G assist. Continues to be unstable but physical assist not needed today. Ambulation/Gait Ambulation/Gait assistance: Min guard Gait Distance (Feet): 35  Feet Assistive device: Rolling walker (2 wheels) Gait Pattern/deviations: Decreased step length - right, Decreased step length - left, Decreased dorsiflexion - left, Decreased dorsiflexion - right, Decreased stride length General Gait Details: patient with slowed cadence, able to ambulate in hallway with min guard/RW, limited mostly due to fatigue and c/o dizziness Gait velocity: slow   ADL: ADL Overall ADL's : Needs assistance/impaired Eating/Feeding: Supervision/ safety, Sitting, Modified independent Grooming: Wash/dry hands, Wash/dry face, Oral care, Standing, Min guard Grooming Details (indicate cue type and reason): Pt able to stand at the sink to complete grooming tasks with RW and min G assist for ~10 minutes. Upper Body Bathing: Set up, Sitting Lower Body Bathing: Min guard, Sitting/lateral leans Upper Body Dressing : Set up, Sitting Lower Body Dressing: Supervision/safety, Sitting/lateral leans Lower Body Dressing Details (indicate cue type and reason): Pt able to doff and don socks seated at EOB with extended time only to don L sock. Toilet Transfer: Minimal assistance, Rolling walker (2 wheels), Ambulation Toilet Transfer Details (indicate cue type and reason): Simulated via EOB to chair with RW and ambulation in hall and back. Toileting- Clothing Manipulation and Hygiene: Set up, Sitting/lateral lean Tub/ Shower Transfer: Minimal assistance, Ambulation, Rolling walker (2 wheels) Functional mobility during ADLs: Min guard, Rolling walker (2 wheels) General ADL Comments: Pt able to ambulate in room with RW to sink and back with min G assist. Pt also wanted to take a few steps without the RW which he did with min G assist but increased instability.   Cognition: Cognition Overall Cognitive Status: Within Functional Limits for tasks assessed Arousal/Alertness: Awake/alert Orientation Level: Oriented X4 Year: 2023 Month: November Day of Week: Correct Attention:  Sustained Sustained Attention: Impaired Sustained Attention Impairment: Verbal basic, Functional basic Memory: Impaired Memory Impairment: Decreased short term memory, Decreased recall of new information, Retrieval deficit Decreased Short Term Memory: Verbal basic, Functional basic Awareness: Appears intact Problem Solving: Appears intact Behaviors: Perseveration Safety/Judgment: Appears intact Cognition Arousal/Alertness: Awake/alert Behavior During Therapy: WFL for tasks assessed/performed Overall Cognitive Status: Within Functional Limits for tasks assessed  Blood pressure (!) 164/65, pulse (!) 46, temperature 98.3 F (36.8 C), resp. rate 20, height '5\' 3"'$  (1.6 m), weight 63.5 kg, SpO2 98 %.  Physical Exam Constitutional: No apparent distress. Appropriate appearance for age.  HENT: + mild JVD. Neck Supple. Trachea midline. Atraumatic, normocephalic. +Edentulous maxilla Eyes: PERRLA. EOMI, with some slow beating nystagmus on leftward gaze. Difficulty looking superiorly.  Cardiovascular: RRR, no murmurs/rub/gallops. No Edema. Peripheral pulses 2+. +Blunting nails Respiratory: CTAB. + wheezing in bilateral upper lobes. On RA.  Abdomen: + bowel sounds, normoactive. No distention or tenderness.  GU: Not examined.  Skin: C/D/I. No apparent lesions. MSK:      No apparent deformity.      Strength:                RUE: 5/5 SA, 5/5 EF, 5/5 EE, 5/5 WE, 5/5 FF, 5/5 FA                 LUE: 4/5 SA, 4/5 EF, 4/5 EE, 4/5 WE, 4/5 FF, 4/5 FA                 RLE: 5/5 HF, 5/5 KE, 5/5 DF, 5/5 EHL, 5/5 PF                 LLE:  5-/5 HF, 5-/5 KE, 5/5 DF, 5/5 EHL, 5/5 PF   Neurologic exam:  Cognition: AAO to person, place, time and event.  Language: Fluent, No substitutions or neoglisms. + Severe dysarthria. Names 3/3 objects correctly.  Memory: Recalls 3/3 objects at 5 minutes. No apparent deficits  Insight: Good insight into current condition.  Mood: Pleasant affect, appropriate mood.  +?pressured speech Sensation: To light touch intact in BL UEs and LEs  Reflexes: 2+ in BL UE and LEs. Negative Hoffman's and babinski signs bilaterally.  CN: + decreased sensation in L V2-3. Otherwise 2-12 intact. Coordination: No apparent tremors. + mild R ataxia on FTN, not on HTS bilaterally.   Spasticity: MAS 0 in all extremities.         Lab Results Last 48 Hours        Results for orders placed or performed during the hospital encounter of 06/01/22 (from the past 48 hour(s))  Basic metabolic panel     Status: Abnormal    Collection Time: 06/04/22  4:20 AM  Result Value Ref Range    Sodium 139 135 - 145 mmol/L    Potassium 2.9 (L) 3.5 - 5.1 mmol/L    Chloride 108 98 - 111 mmol/L    CO2 26 22 - 32 mmol/L    Glucose, Bld 107 (H) 70 - 99 mg/dL      Comment: Glucose reference range applies only to samples taken after fasting for at least 8 hours.    BUN 13 8 - 23 mg/dL    Creatinine, Ser 1.02 0.61 - 1.24 mg/dL    Calcium 8.6 (L) 8.9 - 10.3 mg/dL    GFR, Estimated >60 >60 mL/min      Comment: (NOTE) Calculated using the CKD-EPI Creatinine Equation (2021)      Anion gap 5 5 - 15      Comment: Performed at [facility], [address], [place], [place] [address]  Magnesium     Status: Abnormal    Collection Time: 06/04/22  4:20 AM  Result Value Ref Range    Magnesium 1.6 (L) 1.7 - 2.4 mg/dL      Comment: Performed at [facility], [address] [place],  [place] [address]  Phosphorus     Status: None    Collection Time: 06/04/22  4:20 AM  Result Value Ref Range    Phosphorus 3.2 2.5 - 4.6 mg/dL      Comment: Performed at [facility], [address], [place], [place] [address]  Magnesium     Status: None    Collection Time: 06/05/22  4:23 AM  Result Value Ref Range    Magnesium 2.1 1.7 - 2.4 mg/dL      Comment: Performed at [facility], [address] [place], [place] [address]  Basic metabolic panel     Status: Abnormal    Collection Time: 06/05/22  4:23 AM  Result Value  Ref Range    Sodium 143 135 - 145 mmol/L    Potassium 3.6 3.5 - 5.1 mmol/L      Comment: DELTA CHECK NOTED    Chloride 110 98 - 111 mmol/L    CO2 25 22 - 32 mmol/L    Glucose, Bld 100 (H) 70 - 99 mg/dL      Comment: Glucose reference range applies only to samples taken after fasting for at least 8 hours.    BUN 9 8 - 23 mg/dL    Creatinine, Ser 1.02 0.61 - 1.24 mg/dL    Calcium 9.3 8.9 - 10.3 mg/dL    GFR, Estimated >60 >60 mL/min      Comment: (NOTE) Calculated using the CKD-EPI Creatinine Equation (2021)      Anion gap 8 5 - 15      Comment: Performed at [facility], [address] [place], [place] [address]      Imaging Results (Last 48 hours)  No results found.         Blood pressure (!) 164/65, pulse (!) 46, temperature 98.3 F (36.8 C), resp. rate 20, height '5\' 3"'$  (1.6 m), weight 63.5 kg, SpO2 98 %.   Medical Problem List and Plan: 1. 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  H/o of DVT?/Antithrombotics: -DVT/anticoagulation:  Pharmaceutical: Lovenox              -antiplatelet therapy: Plavix/ASA X 3 months and is to follow with IR closely as OP for stenting on discharge (L ICA only; R ICA chronic total occlusion not a candidate for revascularization)   3. Pain Management: Gabapentin 600 mg TID             --avoid narcotics with polysubstance hx--was not taking hydrocodone, ultram or mobic at home.   4. Mood/Behavior/Sleep: LCSW to follow for evaluation and support.             -antipsychotic agents: N/A  5. Neuropsych/cognition: This patient is capable of making decisions on his own behalf.  6. Skin/Wound Care: Routine pressure relief measures  7. Fluids/Electrolytes/Nutrition:  Monitor I/O. Encourage nectar thick fluid intake             --recheck BMET in am.   8. HTN: Monitor BP TID. Continue Norvasc, Hydralazine, Cozaar and  06/05/2022    8:01 PM 06/05/2022    7:40 PM 06/05/2022    4:06 PM  Vitals with BMI  Systolic 142 395 320  Diastolic 77 85 78  Pulse 76 82 64    9. L-ICA stenosis: Continue DAPT w/Lipitor. Encourage tobacco cessation --Follow up with IVR or Vascular with [facility] after discharge.  10. CAD with HF/atypical CP, c/b cocaine use: Heart healthy diet. Monitor wts daily             --Continue Hydralazine, Lipitor, Cozaar,              --continue to hold metoprolol (BRADYCARDIA) and Imdur-->avoid hypotension.  There were no vitals filed for this visit.   11. H/o Asthma: Stable on Incurse  MDI 12. Polysubstance abuse: Continues to use tobacco, ETOH and cocaine --Encourage cessation 13.  PAD s/p Fem-Fem BG:/stent distal aorta: Encourage tobacco cessation.             --on Lipitor and DAPT 14. Anxiety d/o: Monitor closely 15. Persistent hypokalemia: K+- 2.9 at admission and improved with supplementation; monitor 16. Hypomagnesemia: Mg-1.6-->Improved with supplement yesterday --will start daily supplement as likely affecting K+ levels. 17. Dysphagia: Continue D2, nectar liquids. Encourage fluid intake. 18. ETOH withdrawal: Was on Valium X 3 days -->Finished CIWA protocol             --On Thiamine and Folic acid.        [name], PA-C 06/05/2022  I have examined the patient independently and edited the note for HPI, ROS, exam, assessment, and plan as appropriate. I am in agreement with the above recommendations.   Gertie Gowda, DO 06/05/2022

## 2022-06-05 NOTE — Progress Notes (Signed)
Rested most of the night with brief episodes of requesting food. Patient continues to cough when drinking and taking foods/drinks. It is noted that family has been bringing regular food from the outside for patient; patient unable to tolerate the consistency of food brought in. Education provided to patient regarding dysphagia diet.

## 2022-06-05 NOTE — Progress Notes (Addendum)
Inpatient Rehabilitation Admission Medication Review by a Pharmacist  A complete drug regimen review was completed for this patient to identify any potential clinically significant medication issues.  High Risk Drug Classes Is patient taking? Indication by Medication  Antipsychotic Yes Prn prochlorperazine - n/v  Anticoagulant Yes Enoxaparin - VTE prophylaxis  Antibiotic No   Opioid No   Antiplatelet Yes Aspirin 81 mg and Clopidogrel 75 mg - stroke and L- ICA stent  Hypoglycemics/insulin No   Vasoactive Medication Yes Amlodipine, Hydralazine and Losartan - blood pressure  Chemotherapy No   Other Yes Atorvastatin - hyperlipidemia Gabapentin - neuropathic pain Folate, thiamine, MVI, Magnesium oxide - supplements Latanoprost - glaucoma Nicotine patch - smoking cessation Melatonin - sleep Pantoprazole - GI prophylaxis Incruse - asthma  PRN:  Lorazepam - withdrawal symptoms Trazodone - sleep Diphenhydramine - itching Acetaminophen - pain Albuterol - wheezing, shortness of breath Maalox- indigestion Guiafenesin DM - cough Bisacodyl, senokot S- laxatives     Type of Medication Issue Identified Description of Issue Recommendation(s)  Drug Interaction(s) (clinically significant)     Duplicate Therapy     Allergy     No Medication Administration End Date     Incorrect Dose     Additional Drug Therapy Needed     Significant med changes from prior encounter (inform family/care partners about these prior to discharge). Diazepam 2 mg TID x 3 days > discontinued on transfer. Noted prior Metoprolol d/t bradycardia but had reported not taking. Was on Carvedilol 6.25 mg BID Has also been off prior isosorbide mononitrate Plan CIWA monitoring. Lorazepam prn  Holding beta-blocker.   Amlodipine and Hydralazine new during [facility] admission.  Other  Several medications noted discontinued, but had reported not taking: meloxicam, prn ibuprofen, weekly aledronate, prn Norco, prn  Tramadol.  Avoiding narcotics.    Clinically significant medication issues were identified that warrant physician communication and completion of prescribed/recommended actions by midnight of the next day:  No  Pharmacist comments:   DAPT planned for 90 days, then ASA 81 mg alone > Plavix begun 06/01/22 at [facility].  Time spent performing this drug regimen review (minutes):  [address]   Arty Baumgartner, [place] 06/05/2022 5:39 PM

## 2022-06-05 NOTE — Progress Notes (Signed)
Occupational Therapy Treatment Patient Details Name: Timothy Galvan MRN: [id number] DOB: [birth year] Today's Date: 06/05/2022   History of present illness [name] is a 66 y.o. male with medical history significant of Asthma, CAD, GERD, HTN, Colon cancer, and PAD. Pt presenting after 3 days acute onset of dizziness and stuttering. Feeling off balance w/ movement. Difficulty ambulating for past 2 days. Denies CP, SOB, DOE. Endorsing Wheezing offa and on lately. Pts son called EMS after pts stutterintg and dizziness did not resolve. Associated w/ increased fatigue over the last few days.   OT comments  Pt seen this session for continued work towards independence with all ADL's. He presents with continued weakness and balance deficits, along with decreased activity tolerance. With ambulation this session he reports that the longer he is standing the more his calves and the backs of his legs tighten up. Due to continued safety concerns and need for min A or more, continue to recommend AIR to maximize his independence and safety. OT will continue to follow acutely.    Recommendations for follow up therapy are one component of a multi-disciplinary discharge planning process, led by the attending physician.  Recommendations may be updated based on patient status, additional functional criteria and insurance authorization.    Follow Up Recommendations  Acute inpatient rehab (3hours/day)     Assistance Recommended at Discharge Frequent or constant Supervision/Assistance  Patient can return home with the following  A little help with walking and/or transfers;A little help with bathing/dressing/bathroom;Assistance with cooking/housework;Assist for transportation;Help with stairs or ramp for entrance   Equipment Recommendations  None recommended by OT    Recommendations for Other Services Other (comment) (Vision Specialist)    Precautions / Restrictions Precautions Precautions:  Fall Restrictions Weight Bearing Restrictions: No       Mobility Bed Mobility Overal bed mobility: Needs Assistance Bed Mobility: Supine to Sit     Supine to sit: Supervision     General bed mobility comments: Slightly labored.    Transfers Overall transfer level: Needs assistance Equipment used: Rolling walker (2 wheels) Transfers: Sit to/from Stand Sit to Stand: Min guard, Min assist           General transfer comment: Pt needing min G to min A to boost from EOB. Pt able to step and pivot for transfer with min G assist. Continues to be unstable.     Balance Overall balance assessment: Needs assistance Sitting-balance support: No upper extremity supported, Feet supported Sitting balance-Leahy Scale: Good Sitting balance - Comments: seated EOB   Standing balance support: Bilateral upper extremity supported, During functional activity, Reliant on assistive device for balance Standing balance-Leahy Scale: Fair Standing balance comment: fair to poor with RW, leaning to the L                           ADL either performed or assessed with clinical judgement   ADL Overall ADL's : Needs assistance/impaired     Grooming: Wash/dry hands;Wash/dry face;Min guard;Standing Grooming Details (indicate cue type and reason): Completed at sink                 Toilet Transfer: Minimal assistance;Rolling walker (2 wheels);Ambulation Toilet Transfer Details (indicate cue type and reason): Min A for balance in standing from low surfaces Toileting- Clothing Manipulation and Hygiene: Minimal assistance;Sitting/lateral lean;Sit to/from stand Toileting - Clothing Manipulation Details (indicate cue type and reason): Min A with balance when standing to complete pericare and if  he had to pull up pants/underwear     Functional mobility during ADLs: Min guard;Rolling walker (2 wheels) General ADL Comments: Pt leans to the L when he walks, which he is able to report,  requiring min guard to min A for ADL's assessed this session    Extremity/Trunk Assessment Upper Extremity Assessment Upper Extremity Assessment: Generalized weakness   Lower Extremity Assessment Lower Extremity Assessment: Defer to PT evaluation        Vision   Vision Assessment?: Yes Tracking/Visual Pursuits: Impaired - to be further tested in functional context Additional Comments: Pt continues to have difficulty with tracking, however he reports no dizziness, double vision, or blurriness with his glasses on.   Perception Perception Perception: Not tested   Praxis Praxis Praxis: Not tested    Cognition Arousal/Alertness: Awake/alert Behavior During Therapy: WFL for tasks assessed/performed Overall Cognitive Status: No family/caregiver present to determine baseline cognitive functioning                                          Exercises      Shoulder Instructions       General Comments VSS on RA, at times pt appears mildly SOB    Pertinent Vitals/ Pain       Pain Assessment Pain Assessment: No/denies pain  Home Living                                          Prior Functioning/Environment              Frequency  Min 2X/week        Progress Toward Goals  OT Goals(current goals can now be found in the care plan section)  Progress towards OT goals: Progressing toward goals  Acute Rehab OT Goals Patient Stated Goal: To get stronger so he can go home alone OT Goal Formulation: With patient Time For Goal Achievement: 06/16/22 Potential to Achieve Goals: Good ADL Goals Pt Will Perform Grooming: with modified independence;standing Pt Will Perform Lower Body Bathing: with modified independence;sitting/lateral leans Pt Will Perform Lower Body Dressing: with modified independence;sitting/lateral leans Pt Will Transfer to Toilet: with modified independence;ambulating Pt Will Perform Toileting - Clothing Manipulation and  hygiene: with modified independence;sitting/lateral leans Pt/caregiver will Perform Home Exercise Program: Increased strength;Both right and left upper extremity;Independently Additional ADL Goal #1: Pt will demosntrate improved visual tracking and and visual fields by smooth tracking and identification of peripheral objecst within normal limits during formal testing.  Plan Discharge plan remains appropriate    Co-evaluation                 AM-PAC OT "6 Clicks" Daily Activity     Outcome Measure   Help from another person eating meals?: A Little Help from another person taking care of personal grooming?: A Little Help from another person toileting, which includes using toliet, bedpan, or urinal?: A Lot Help from another person bathing (including washing, rinsing, drying)?: A Lot Help from another person to put on and taking off regular upper body clothing?: A Little Help from another person to put on and taking off regular lower body clothing?: A Little 6 Click Score: 16    End of Session Equipment Utilized During Treatment: Rolling walker (2 wheels);Gait belt  OT Visit Diagnosis: Unsteadiness on  feet (R26.81);Other abnormalities of gait and mobility (R26.89);Muscle weakness (generalized) (M62.81);Other symptoms and signs involving the nervous system (R29.898);Dizziness and giddiness (R42)   Activity Tolerance Patient tolerated treatment well   Patient Left in chair;with call bell/phone within reach;with chair alarm set   Nurse Communication Mobility status        Time: 6283-1517 OT Time Calculation (min): 19 min  Charges: OT General Charges $OT Visit: 1 Visit OT Treatments $Self Care/Home Management : 8-22 mins  Keagan Brislin Yolanda Bonine, OTR/L [place] 06/05/2022, 10:18 AM

## 2022-06-06 LAB — DRUG PROFILE, UR, 9 DRUGS (LABCORP)
Amphetamines, Urine: NEGATIVE ng/mL
Barbiturate, Ur: NEGATIVE ng/mL
Benzodiazepine Quant, Ur: NEGATIVE ng/mL
Cannabinoid Quant, Ur: NEGATIVE ng/mL
Cocaine (Metab.): POSITIVE ng/mL — AB
Methadone Screen, Urine: NEGATIVE ng/mL
Opiate Quant, Ur: NEGATIVE ng/mL
Phencyclidine, Ur: NEGATIVE ng/mL
Propoxyphene, Urine: NEGATIVE ng/mL

## 2022-06-06 LAB — CBC WITH DIFFERENTIAL/PLATELET
Abs Immature Granulocytes: 0.02 10*3/uL (ref 0.00–0.07)
Basophils Absolute: 0 10*3/uL (ref 0.0–0.1)
Basophils Relative: 0 %
Eosinophils Absolute: 0.2 10*3/uL (ref 0.0–0.5)
Eosinophils Relative: 2 %
HCT: 40.4 % (ref 39.0–52.0)
Hemoglobin: 13.2 g/dL (ref 13.0–17.0)
Immature Granulocytes: 0 %
Lymphocytes Relative: 50 %
Lymphs Abs: 3.5 10*3/uL (ref 0.7–4.0)
MCH: 29.6 pg (ref 26.0–34.0)
MCHC: 32.7 g/dL (ref 30.0–36.0)
MCV: 90.6 fL (ref 80.0–100.0)
Monocytes Absolute: 0.8 10*3/uL (ref 0.1–1.0)
Monocytes Relative: 12 %
Neutro Abs: 2.6 10*3/uL (ref 1.7–7.7)
Neutrophils Relative %: 36 %
Platelets: 233 10*3/uL (ref 150–400)
RBC: 4.46 MIL/uL (ref 4.22–5.81)
RDW: 14.6 % (ref 11.5–15.5)
WBC: 7.1 10*3/uL (ref 4.0–10.5)
nRBC: 0 % (ref 0.0–0.2)

## 2022-06-06 LAB — MAGNESIUM: Magnesium: 1.7 mg/dL (ref 1.7–2.4)

## 2022-06-06 LAB — COMPREHENSIVE METABOLIC PANEL
ALT: 25 U/L (ref 0–44)
AST: 25 U/L (ref 15–41)
Albumin: 3 g/dL — ABNORMAL LOW (ref 3.5–5.0)
Alkaline Phosphatase: 67 U/L (ref 38–126)
Anion gap: 9 (ref 5–15)
BUN: 9 mg/dL (ref 8–23)
CO2: 23 mmol/L (ref 22–32)
Calcium: 9.1 mg/dL (ref 8.9–10.3)
Chloride: 108 mmol/L (ref 98–111)
Creatinine, Ser: 1.18 mg/dL (ref 0.61–1.24)
GFR, Estimated: >60 mL/min (ref >60)
Glucose, Bld: 107 mg/dL — ABNORMAL HIGH (ref 70–99)
Potassium: 4 mmol/L (ref 3.5–5.1)
Sodium: 140 mmol/L (ref 135–145)
Total Bilirubin: 0.4 mg/dL (ref 0.3–1.2)
Total Protein: 5.9 g/dL — ABNORMAL LOW (ref 6.5–8.1)

## 2022-06-06 NOTE — Progress Notes (Signed)
PROGRESS NOTE   Subjective/Complaints:  No acute events overnight. No complaints. Labs and vitgals mostly stable; encouraged PO fluid intake, difficult with thickened liquids.   ROS: Denies fevers, chills, N/V, abdominal pain, constipation, diarrhea, SOB, cough, chest pain, new weakness or paraesthesias.    Objective:   No results found. Recent Labs    06/06/22 0610  WBC 7.1  HGB 13.2  HCT 40.4  PLT 233   Recent Labs    06/05/22 0423 06/06/22 0610  NA 143 140  K 3.6 4.0  CL 110 108  CO2 25 23  GLUCOSE 100* 107*  BUN 9 9  CREATININE 1.02 1.18  CALCIUM 9.3 9.1    Intake/Output Summary (Last 24 hours) at 06/06/2022 2230 Last data filed at 06/06/2022 2208 Gross per 24 hour  Intake 1440 ml  Output 2000 ml  Net -560 ml        Physical Exam: Vital Signs Blood pressure 137/70, pulse 69, temperature (!) 97.5 F (36.4 C), resp. rate 16, SpO2 96 %. Constitutional: No apparent distress. Appropriate appearance for age.  HENT: + mild JVD. Neck Supple. Trachea midline. Atraumatic, normocephalic. +Edentulous maxilla Eyes: PERRLA. EOMI Cardiovascular: RRR, no murmurs/rub/gallops. No Edema. Peripheral pulses 2+. +Blunting nails Respiratory: CTAB.No rales, rhonchi, or wheezing. . On RA.  Abdomen: + bowel sounds, normoactive. No distention or tenderness.  GU: Not examined.  Skin: C/D/I. No apparent lesions. MSK:      No apparent deformity.      Strength:                RUE: 5/5 SA, 5/5 EF, 5/5 EE, 5/5 WE, 5/5 FF, 5/5 FA                 LUE: 4/5 SA, 4/5 EF, 4/5 EE, 4/5 WE, 4/5 FF, 4/5 FA                 RLE: 5/5 HF, 5/5 KE, 5/5 DF, 5/5 EHL, 5/5 PF                 LLE:  5-/5 HF, 5-/5 KE, 5/5 DF, 5/5 EHL, 5/5 PF    Neurologic exam:  Cognition: AAO to person, place, time and event.  Language: Fluent, No substitutions or neoglisms. + Severe dysarthria.  Insight: Good insight into current condition.  Mood: Pleasant  affect, appropriate mood. CN: + decreased sensation in L V2-3.  Coordination: No apparent tremors. + mild R ataxia      Assessment/Plan: 1. Functional deficits which require 3+ hours per day of interdisciplinary therapy in a comprehensive inpatient rehab setting. Physiatrist is providing close team supervision and 24 hour management of active medical problems listed below. Physiatrist and rehab team continue to assess barriers to discharge/monitor patient progress toward functional and medical goals  Care Tool:  Bathing    Body parts bathed by patient: Right arm, Left arm, Chest, Abdomen, Front perineal area, Buttocks, Right upper leg, Left upper leg, Right lower leg, Left lower leg, Face         Bathing assist Assist Level: Minimal Assistance - Patient > 75%     Upper Body Dressing/Undressing Upper body dressing   What  is the patient wearing?: Pull over shirt    Upper body assist Assist Level: Minimal Assistance - Patient > 75%    Lower Body Dressing/Undressing Lower body dressing      What is the patient wearing?: Incontinence brief, Pants     Lower body assist Assist for lower body dressing: Minimal Assistance - Patient > 75%     Toileting Toileting    Toileting assist Assist for toileting: Minimal Assistance - Patient > 75%     Transfers Chair/bed transfer  Transfers assist     Chair/bed transfer assist level: Minimal Assistance - Patient > 75%     Locomotion Ambulation   Ambulation assist      Assist level: Moderate Assistance - Patient 50 - 74% Assistive device: No Device Max distance: 150   Walk 10 feet activity   Assist     Assist level: Moderate Assistance - Patient - 50 - 74% Assistive device: No Device, Hand held assist   Walk 50 feet activity   Assist    Assist level: Moderate Assistance - Patient - 50 - 74% Assistive device: No Device, Hand held assist    Walk 150 feet activity   Assist    Assist level: Moderate  Assistance - Patient - 50 - 74% Assistive device: No Device, Hand held assist    Walk 10 feet on uneven surface  activity   Assist     Assist level: Moderate Assistance - Patient - 50 - 74% Assistive device: Hand held assist   Wheelchair     Assist Is the patient using a wheelchair?: Yes Type of Wheelchair: Manual    Wheelchair assist level: Supervision/Verbal cueing Max wheelchair distance: 150    Wheelchair 50 feet with 2 turns activity    Assist        Assist Level: Supervision/Verbal cueing   Wheelchair 150 feet activity     Assist      Assist Level: Supervision/Verbal cueing   Blood pressure 137/70, pulse 69, temperature (!) 97.5 F (36.4 C), resp. rate 16, SpO2 96 %.   Medical Problem List and Plan: 1. 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  H/o of DVT?/Antithrombotics: -DVT/anticoagulation:  Pharmaceutical: Lovenox              -antiplatelet therapy: Plavix/ASA X 3 months and is to follow with IR closely as OP for stenting on discharge (L ICA only; R ICA chronic total occlusion not a candidate for revascularization)   3. Pain Management: Gabapentin 600 mg TID             --avoid narcotics with polysubstance hx--was not taking hydrocodone, ultram or mobic at home.   4. Mood/Behavior/Sleep: LCSW to follow for evaluation and support.             -antipsychotic agents: N/A   5. Neuropsych/cognition: This patient is capable of making decisions on his own behalf.   6. Skin/Wound Care: Routine pressure relief measures   7. Fluids/Electrolytes/Nutrition: Monitor I/O. Encourage nectar thick fluid intake             --  recheck BMET in am.    8. HTN: Monitor BP  TID. Continue Norvasc, Hydralazine, Cozaar      06/05/2022    8:01 PM 06/05/2022    7:40 PM 06/05/2022    4:06 PM  Vitals with BMI  Systolic 159 458 592  Diastolic 77 85 78  Pulse 76 82 64   - stable on admission   9. L-ICA stenosis: Continue DAPT w/Lipitor. Encourage tobacco cessation --Follow up with IVR or [facility] with [facility] after discharge.  10. CAD with HF/atypical CP, c/b cocaine use: Heart healthy diet. Monitor wts daily             --Continue Hydralazine, Lipitor, Cozaar,              --continue to hold metoprolol (BRADYCARDIA) and Imdur-->avoid hypotension.      11. H/o Asthma: Stable on Incurse  MDI 12. Polysubstance abuse: Continues to use tobacco, ETOH and cocaine --Encourage cessation 13.  PAD s/p Fem-Fem BG:/stent distal aorta: Encourage tobacco cessation.             --on Lipitor and DAPT 14. Anxiety d/o: Monitor closely 15. Persistent hypokalemia/AKI: K+- 2.9 at admission and improved with supplementation; monitor - K 4.0; Cr slight increase 1.18 oi admission labs -> Encourage PO fluids and repeat Monday  16. Hypomagnesemia: Mg-1.6-->Improved with supplement yesterday --will start daily supplement as likely affecting K+ levels. - 11.25: Mag 1.7; stable - monitor 17. Dysphagia: Continue D2, nectar liquids. Encourage fluid intake.  18. ETOH withdrawal: Was on Valium X 3 days -->Finished CIWA protocol             --On Thiamine and Folic acid.   LOS: 1 days A FACE TO [place] 06/06/2022, 10:30 PM

## 2022-06-06 NOTE — Plan of Care (Signed)
  Problem: RH Swallowing Goal: LTG Pt will demonstrate functional change in swallow as evidenced by bedside/clinical objective assessment (SLP) Description: LTG: Patient will demonstrate functional change in swallow as evidenced by bedside/clinical objective assessment (SLP) Flowsheets (Taken 06/06/2022 1254) LTG: Patient will demonstrate functional change in swallow as evidenced by bedside/clinical objective assessment: Oropharyngeal swallow   Problem: RH Expression Communication Goal: LTG Patient will increase speech intelligibility (SLP) Description: LTG: Patient will increase speech intelligibility at word/phrase/conversation level with cues, % of the time (SLP) Flowsheets (Taken 06/06/2022 1254) LTG: Patient will increase speech intelligibility (SLP): Supervision Level: Conversation level   Problem: RH Problem Solving Goal: LTG Patient will demonstrate problem solving for (SLP) Description: LTG:  Patient will demonstrate problem solving for basic/complex daily situations with cues  (SLP) Flowsheets (Taken 06/06/2022 1254) LTG: Patient will demonstrate problem solving for (SLP): Basic daily situations LTG Patient will demonstrate problem solving for: Supervision   Problem: RH Memory Goal: LTG Patient will demonstrate ability for day to day (SLP) Description: LTG:   Patient will demonstrate ability for day to day recall/carryover during cognitive/linguistic activities with assist  (SLP) Flowsheets (Taken 06/06/2022 1254) LTG: Patient will demonstrate ability for day to day recall: New information LTG: Patient will demonstrate ability for day to day recall/carryover during cognitive/linguistic activities with assist (SLP): Supervision   Problem: RH Attention Goal: LTG Patient will demonstrate this level of attention during functional activites (SLP) Description: LTG:  Patient will will demonstrate this level of attention during functional activites (SLP) Flowsheets (Taken 06/06/2022  1254) Patient will demonstrate during cognitive/linguistic activities the attention type of: Selective Patient will demonstrate this level of attention during cognitive/linguistic activities in: Home LTG: Patient will demonstrate this level of attention during cognitive/linguistic activities with assistance of (SLP): Supervision   Problem: RH Awareness Goal: LTG: Patient will demonstrate awareness during functional activites type of (SLP) Description: LTG: Patient will demonstrate awareness during functional activites type of (SLP) Flowsheets (Taken 06/06/2022 1254) Patient will demonstrate during cognitive/linguistic activities awareness type of: Emergent LTG: Patient will demonstrate awareness during cognitive/linguistic activities with assistance of (SLP): Supervision

## 2022-06-06 NOTE — Evaluation (Signed)
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 ([place]) Active Problems:   Left pontine stroke ([place])  Past Medical History:  Past Medical History:  Diagnosis Date   Asthma    Avascular necrosis of bone of hip ([place])    Blood clot in vein    Cancer ([place])    Coronary artery disease    GERD (gastroesophageal reflux disease)    Heart disease    Hypertension    Pneumonia    Past Surgical History:  Past Surgical History:  Procedure Laterality Date   FEMORAL BYPASS     MANDIBLE SURGERY     WRIST SURGERY      Assessment / Plan / Recommendation Clinical Impression  Timothy Galvan is a 66 year old male with history of CAD, colon CA, PAD s/p FFBG, polysubstance abuse (cocaine, ETOH, tobacco), recent TIA 02/2022;  who was admitted on 06/01/22 to [facility] with 3 day history of dizziness, balance problems with difficulty walking, wheezing and stuttering. UDS positive for cocaine. CTA head/neck was negative for LVO. MRI/MRA brain done revealing acute to subacute infarcts in left basal ganglia and right pons without hemorrhage, unchanged occlusion of R-ICA and unchanged severe stenosis L-ICA with focal moderate to severe stenosis of distal right M1 segment and short segment severe stenosis of L P2/P3 segment. 2D echo done revealing mild decrease in LVF 45-50% with mild decrease in function and mild concentric LVH.  Stroke felt to be embolic and 30 day event monitor to be mailed to his home. Patient transferred to CIR on 06/05/2022 .     Pt presents with mild oral and pharyngeal dysphagia exacerbated by impulsive  intake and decreased awareness. Pt initially noted with wet vocal quality effectively cleared with strong cued throat clear. Pt administered trials of ice chips, nectar liquids via cup and straw and thin liquids via cup and straw with no significant s/s aspiration of difficulty. Despite cues for small sips and slow rate, pt with large, consecutive sips of thin liquids (however no s/s aspiration noted). Pt agreeable to upgrade to thin liquids via 10cc Provale cup only to limit bolus size at this time. Pt consuming trials of Dys 3 and regular solids, both with mild extended mastication 2' upper edentulous status and sore gums d/t recent teeth extraction. Pt with no s/s aspiration with any solid trials, min oral stasis effectively cleared with spontaneous second swallow and/or liquid wash. Recommend remain Dys 3 textures and upgrade to thin liquids via 10cc Provale cup only, intermittent supervision.  Pt reports not significant change in cognitive status, unsure of baseline cognition but pt reports living a rather sedentary life with supportive family as needed. Pt does admit to being forgetful with medication (sent to his home in individual packets) and pays his limited bills in cash in person. Pt noted with deficits in short term recall, recall of novel information, problem solving, sustained attention and insight/awareness. Pt endorses change in speech intelligibility resulting in frustration with communication. Pt averages ~70% intelligibility with phrase/short sentences when cued to reduce rate and over articulate. 1 instance of dysfluency/stuttering noted, in general speech intelligibility appears to have greatly improved in the last week. Pt  will benefit from skilled ST during CIR to increase independence and safety with daily routine, speech intelligibility and safe intake of least restrictive diet.    Skilled Therapeutic Interventions          Pt participating in Bedside Swallow Evaluation, portions of  Cognistat and further nonstandardized assessments of speech, language and cognition. Please see above.   SLP Assessment  Patient will need skilled Speech Lanaguage Pathology Services during CIR admission    Recommendations  SLP Diet Recommendations: Thin;Dysphagia 3 (Mech soft) (thin via 10cc Provale only) Liquid Administration via: Cup (Provale only) Medication Administration: Whole meds with puree Supervision: Patient able to self feed;Intermittent supervision to cue for compensatory strategies Compensations: Slow rate;Small sips/bites Postural Changes and/or Swallow Maneuvers: Seated upright 90 degrees;Upright 30-60 min after meal Oral Care Recommendations: Oral care BID Patient destination: Home Follow up Recommendations: Home Health SLP Equipment Recommended: To be determined    SLP Frequency 3 to 5 out of 7 days   SLP Duration  SLP Intensity  SLP Treatment/Interventions 7-9 days  Minumum of 1-2 x/day, 30 to 90 minutes  Cognitive remediation/compensation;Dysphagia/aspiration precaution training;Internal/external aids;Therapeutic Activities;Therapeutic Exercise;Patient/family education;Functional tasks;Cueing hierarchy;Environmental controls    Pain Pain Assessment Pain Scale: 0-10 Pain Score: 0-No pain  Prior Functioning Cognitive/Linguistic Baseline: Within functional limits Type of Home: Apartment  Lives With: Alone Available Help at Discharge: Family;Available PRN/intermittently Education: 7th grade Vocation: On disability  SLP Evaluation Cognition Overall Cognitive Status: Impaired/Different from baseline Arousal/Alertness: Awake/alert Orientation Level: Oriented X4 Year: 2023 Month: November Day of Week: Correct Attention: Sustained Sustained Attention: Impaired Sustained Attention Impairment: Functional basic Memory: Impaired Memory Impairment: Decreased short term memory;Decreased recall of new information;Retrieval deficit Decreased Short Term  Memory: Functional complex;Verbal complex Awareness: Impaired Awareness Impairment: Emergent impairment Problem Solving: Impaired Problem Solving Impairment: Verbal complex;Functional complex Executive Function: Self Correcting;Self Monitoring;Organizing;Decision Making Organizing: Impaired Organizing Impairment: Verbal complex Decision Making: Impaired Decision Making Impairment: Verbal complex Self Monitoring: Impaired Self Monitoring Impairment: Verbal complex;Functional complex Self Correcting: Impaired Self Correcting Impairment: Functional basic;Functional complex Behaviors: Perseveration;Impulsive Safety/Judgment: Impaired  Comprehension Auditory Comprehension Overall Auditory Comprehension: Appears within functional limits for tasks assessed Yes/No Questions: Within Functional Limits Commands: Within Functional Limits Conversation: Simple Expression Expression Primary Mode of Expression: Verbal Written Expression Dominant Hand: Right Oral Motor Oral Motor/Sensory Function Overall Oral Motor/Sensory Function: Mild impairment Facial ROM: Within Functional Limits Facial Symmetry: Within Functional Limits Facial Strength: Within Functional Limits Facial Sensation: Reduced right Lingual Symmetry: Within Functional Limits Lingual Strength: Reduced Lingual Sensation: Within Functional Limits Velum: Within Functional Limits Mandible: Within Functional Limits Motor Speech Overall Motor Speech: Impaired Respiration: Within functional limits Level of Impairment: Sentence Phonation: Low vocal intensity Resonance: Within functional limits Articulation: Impaired Level of Impairment: Word Intelligibility: Intelligibility reduced Word: 75-100% accurate Phrase: 75-100% accurate Sentence: 50-74% accurate Conversation: 50-74% accurate Motor Speech Errors: Inconsistent;Unaware Effective Techniques: Slow rate;Increased vocal intensity;Over-articulate;Pacing  Care Tool Care  Tool Cognition Ability to hear (with hearing aid or hearing appliances if normally used Ability to hear (with hearing aid or hearing appliances if normally used): 0. Adequate - no difficulty in normal conservation, social interaction, listening to TV   Expression of Ideas and Wants Expression of Ideas and Wants: 3. Some difficulty - exhibits some difficulty with expressing needs and ideas (e.g, some words or finishing thoughts) or speech is not clear   Understanding Verbal and Non-Verbal Content Understanding Verbal and Non-Verbal Content: 3. Usually understands - understands most conversations, but misses some part/intent of message. Requires cues  at times to understand  Memory/Recall Ability Memory/Recall Ability : Current season;That he or she is in a hospital/hospital unit   Intelligibility: Intelligibility reduced Word: 75-100% accurate Phrase: 75-100% accurate Sentence: 50-74% accurate Conversation: 50-74% accurate  Bedside Swallowing Assessment General Date of Onset: 06/01/22 Previous Swallow Assessment: BSE in acute 11/22, downgraded to Dys 3/NTL Diet Prior to this Study: Dysphagia 3 (soft);Nectar-thick liquids Temperature Spikes Noted: No Respiratory Status: Room air History of Recent Intubation: No Behavior/Cognition: Impulsive;Alert;Cooperative;Pleasant mood Oral Cavity - Dentition: Edentulous;Missing dentition;Poor condition Self-Feeding Abilities: Able to feed self Vision: Functional for self-feeding Patient Positioning: Other (comment) (sitting EOB) Baseline Vocal Quality: Low vocal intensity;Wet Volitional Cough: Strong Volitional Swallow: Able to elicit  Oral Care Assessment Oral Assessment  (WDL): Exceptions to WDL Lips: Symmetrical Teeth: Missing (Comment) (edentulous upper) Tongue: Pink;Moist Mucous Membrane(s): Moist;Pink Saliva: Moist, saliva free flowing Level of Consciousness: Alert Is patient on any of following O2 devices?: None of the above Nutritional  status: Dysphagia Oral Assessment Risk : High Risk Ice Chips Ice chips: Within functional limits Presentation: Spoon Thin Liquid Thin Liquid: Impaired Presentation: Cup;Straw;Self Fed Oral Phase Impairments: Reduced lingual movement/coordination Nectar Thick Nectar Thick Liquid: Within functional limits Honey Thick Honey Thick Liquid: Not tested Puree Puree: Within functional limits Presentation: Spoon;Self Fed Solid Solid: Impaired Presentation: Spoon Oral Phase Impairments: Reduced lingual movement/coordination;Impaired mastication Oral Phase Functional Implications: Impaired mastication;Prolonged oral transit;Oral residue BSE Assessment Risk for Aspiration Impact on safety and function: Mild aspiration risk Other Related Risk Factors: Cognitive impairment  Short Term Goals: Week 1: SLP Short Term Goal 1 (Week 1): STG = LTG d/t ELOS  Refer to Care Plan for Long Term Goals  Recommendations for other services: None   Discharge Criteria: Patient will be discharged from SLP if patient refuses treatment 3 consecutive times without medical reason, if treatment goals not met, if there is a change in medical status, if patient makes no progress towards goals or if patient is discharged from hospital.  The above assessment, treatment plan, treatment alternatives and goals were discussed and mutually agreed upon: by patient  Dewaine Conger 06/06/2022, 12:48 PM

## 2022-06-06 NOTE — Evaluation (Addendum)
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 ([place]) Active Problems:   Left pontine stroke ([place])   Past Medical History:  Past Medical History:  Diagnosis Date   Asthma    Avascular necrosis of bone of hip ([place])    Blood clot in vein    Cancer ([place])    Coronary artery disease    GERD (gastroesophageal reflux disease)    Heart disease    Hypertension    Pneumonia    Past Surgical History:  Past Surgical History:  Procedure Laterality Date   FEMORAL BYPASS     MANDIBLE SURGERY     WRIST SURGERY      Assessment & Plan Clinical Impression:   Timothy Galvan is a 66 year old male with history of CAD, colon CA, PAD s/p FFBG, polysubstance abuse (cocaine, ETOH, tobacco), recent TIA 02/2022;  who was admitted on 06/01/22 to [facility] with 3 day history of dizziness, balance problems with difficulty walking, wheezing and stuttering. UDS positive for cocaine. CTA head/neck was negative for LVO. MRI/MRA brain done revealing acute to subacute infarcts in left basal ganglia and right pons without hemorrhage, unchanged occlusion of R-ICA and unchanged severe stenosis L-ICA with focal moderate to severe stenosis of distal right M1 segment and short segment severe stenosis of L P2/P3 segment. 2D echo done revealing mild decrease in LVF 45-50% with mild decrease in function and mild concentric LVH.  Stroke felt to be embolic and 30 day event monitor to be mailed to his home.    Dr. Hortense Ramal recommended DAPT X 3 months followed by ASA alone as well as following up with Dr. Dr. Eben Burow for input on stenting of L-ICA after discharge. BP has been labile and Losartan and Amlodipine added. Valium X 3 days due to ETOH abuse and CIWA protocol. He has had issues with wheezing on and off lately per reports and diet downgraded to D3, nectar liquids due to issues with coughing as well as recent teeth extractions w/sore mouth.  Electrolyte abnormalities supplemented. Patient noted to have dysarthria with low voice, expressive deficits, cognitive deficits with SLUMS 14/23 (has 7th grade education), weakness with balance deficitsPatient transferred to CIR on 06/05/2022 .   Patient currently requires mod with mobility secondary to impaired timing and sequencing, ataxia, and decreased coordination and decreased standing balance, hemiplegia, and decreased balance strategies.  Prior to hospitalization, patient was modified independent  with mobility and lived Alone (PRN he might be able to go to sister's house, but prefers to go to his apartment) in a [place] home.  Safety for d/c home alone TBD, but only intermittent asisst available from family and neighbors. Home access is  Level entry/threshold.  Patient will benefit from skilled PT intervention to maximize safe functional mobility, minimize fall risk, and decrease caregiver burden for planned discharge home alone.  Anticipate patient will benefit from follow up [place] at discharge.  PT - End of Session Activity Tolerance: Tolerates < 10 min activity with changes in vital signs Endurance Deficit: Yes Endurance Deficit Description: pt wheezes intermittently, and bil LE fatigue limits gait distance PT Assessment  Rehab Potential (ACUTE/IP ONLY): Good PT Patient demonstrates impairments in the following area(s): Balance;Pain;Safety;Endurance;Sensory;Motor PT Transfers Functional Problem(s): Bed Mobility;Bed to Chair;Car;Furniture;Floor PT Locomotion Functional Problem(s): Ambulation;Wheelchair Mobility;Stairs PT Plan PT Intensity: Minimum  of 1-2 x/day ,45 to 90 minutes PT Frequency: 5 out of 7 days PT Duration Estimated Length of Stay: 7-9 PT Treatment/Interventions: Ambulation/gait training;Cognitive remediation/compensation;Discharge planning;DME/adaptive equipment instruction;Functional mobility training;Pain management;Psychosocial support;Splinting/orthotics;Therapeutic Activities;UE/LE Strength taining/ROM;Balance/vestibular training;Community reintegration;Functional electrical stimulation;Neuromuscular re-education;Patient/family education;Stair training;Therapeutic Exercise;UE/LE Coordination activities;Wheelchair propulsion/positioning PT Transfers Anticipated Outcome(s): modified independent  basic , supervision car and floor PT Locomotion Anticipated Outcome(s): supervision in controlled and community settings x 150' wiht LRAD; modified independent in household x 52' with LRAD; supervision up/down 12 steps 1 railing (?as per sister's house); modified independent up/down threshold for emergency exit of apartment; superivsion in controlled setting wc x 250' PT Recommendation Follow Up Recommendations: Home health PT (possibly OPPT if transportation is available) Patient destination: Home Equipment Recommended: To be determined Equipment Details: owns rollator, RW, [facility]   PT Evaluation Precautions/Restrictions- fall Precautions Precautions: Fall Precaution Comments: Diet restrictions - nectar, edentulous Restrictions Weight Bearing Restrictions: No  Pain Pain Assessment Pain Score: 0-No pain (at rest, but pt has hx of LBP) Pain Interference Pain Interference Pain Effect on Sleep: 3. Frequently (bil LEs) Pain Interference with Therapy Activities: 1. Rarely or not at all Pain Interference with Day-to-Day Activities: 1. Rarely or not at all Home Living/Prior [place] Available Help at Discharge: Family;Available PRN/intermittently;Friend(s) (has a support lady) Type of Home: Apartment Home Access:  Level entry Home Layout: One level Bathroom Shower/Tub: Chiropodist: Standard Bathroom Accessibility: Yes  Lives With: Alone (PRN he might be able to go to sister's house, but prefers to go to his apartment) Prior Function Level of Independence: Independent with basic ADLs;Independent with homemaking with ambulation  Able to Take Stairs?: Reciprically Driving: No Vocation: On disability Vision/Perception  Vision - History Ability to See in Adequate Light: 0 Adequate  Cognition Overall Cognitive Status: Within Functional Limits for tasks assessed Arousal/Alertness: Awake/alert Orientation Level: Oriented X4 Year: 2023 Month: November Day of Week: Correct Attention: Sustained Memory: Impaired Memory Impairment: Decreased short term memory Awareness: Impaired Problem Solving: Impaired Safety/Judgment: Impaired Comments: poor insight into deficits. pt has 7th grade education Sensation Sensation Light Touch: Impaired by gross assessment (deficit per pt dorsum of R foot, otherwise intact) Proprioception: Appears Intact Coordination Gross Motor Movements are Fluid and Coordinated: No Coordination and Movement Description: ataxia RLE, RUE and trunk Heel Shin Test: decreased accuracy RLE Motor  Motor Motor: Ataxia (RUE, RLE, trunk) Motor - Skilled Clinical Observations: truncal ataxia noted when pt's balance is disturbed minimally by PT   Trunk/Postural Assessment  Cervical Assessment Cervical Assessment: Within Functional Limits Thoracic Assessment Thoracic Assessment: Within Functional Limits (in sitting, R shoulder higher than L) Lumbar Assessment Lumbar Assessment: Within Functional Limits Postural Control Postural Control: Deficits on evaluation Trunk Control: truncal ataxia when given mild external perturbations from any directions Protective Responses: slow and insufficient  Balance Balance Balance Assessed: Yes Static Sitting Balance Static  Sitting - Balance Support: Feet supported;No upper extremity supported Static Sitting - Level of Assistance: 5: Stand by assistance Dynamic Sitting Balance Dynamic Sitting - Balance Support: Feet supported;No upper extremity supported;During functional activity Dynamic Sitting - Level of Assistance: 5: Stand by assistance Reach (Patient is able to reach ___ inches to right, left, forward, back): 3 Dynamic Sitting - Balance Activities: Lateral lean/weight shifting;Forward lean/weight shifting Sitting balance - Comments: seated EOB Static  Standing Balance Static Standing - Balance Support: During functional activity;Right upper extremity supported;Left upper extremity supported Static Standing - Level of Assistance: 4: Min assist Dynamic Standing Balance Dynamic Standing - Balance Support: Right upper extremity supported;Left upper extremity supported;During functional activity Dynamic Standing - Level of Assistance: 4: Min assist Extremity Assessment      RLE Assessment RLE Assessment: Within Functional Limits General Strength Comments: grossly in sitting: 5/5 hip flexion abduction/adduction, knee extension, ankle DF LLE Assessment LLE Assessment: Exceptions to [facility] General Strength Comments: grossly in sitting: 4/5 hip flexion, hip adduction/abduction, knee extensoin, ankle DF  Care Tool Care Tool Bed Mobility Roll left and right activity   Roll left and right assist level: Supervision/Verbal cueing    Sit to lying activity   Sit to lying assist level: Supervision/Verbal cueing    Lying to sitting on side of bed activity   Lying to sitting on side of bed assist level: the ability to move from lying on the back to sitting on the side of the bed with no back support.: Supervision/Verbal cueing     Care Tool Transfers Sit to stand transfer   Sit to stand assist level: Minimal Assistance - Patient > 75%    Chair/bed transfer   Chair/bed transfer assist level: Minimal Assistance -  Patient > 75%     Physiological scientist transfer assist level: Minimal Assistance - Patient > 75%      Care Tool Locomotion Ambulation   Assist level: Moderate Assistance - Patient 50 - 74% Assistive device: No Device Max distance: 150  Walk 10 feet activity   Assist level: Moderate Assistance - Patient - 50 - 74% Assistive device: No Device;Hand held assist   Walk 50 feet with 2 turns activity   Assist level: Moderate Assistance - Patient - 50 - 74% Assistive device: No Device;Hand held assist  Walk 150 feet activity   Assist level: Moderate Assistance - Patient - 50 - 74% Assistive device: No Device;Hand held assist  Walk 10 feet on uneven surfaces activity   Assist level: Moderate Assistance - Patient - 50 - 74% Assistive device: Hand held assist  Stairs   Assist level: Minimal Assistance - Patient > 75% Stairs assistive device: 2 hand rails Max number of stairs: 12  Walk up/down 1 step activity   Walk up/down 1 step (curb) assist level: Minimal Assistance - Patient > 75% Walk up/down 1 step or curb assistive device: 2 hand rails  Walk up/down 4 steps activity   Walk up/down 4 steps assist level: Minimal Assistance - Patient > 75% Walk up/down 4 steps assistive device: 2 hand rails  Walk up/down 12 steps activity   Walk up/down 12 steps assist level: Minimal Assistance - Patient > 75% Walk up/down 12 steps assistive device: 2 hand rails  Pick up small objects from floor   Pick up small object from the floor assist level: Moderate Assistance - Patient 50 - 74% Pick up small object from the floor assistive device: none  Wheelchair Is the patient using a wheelchair?: Yes Type of Wheelchair: Manual   Wheelchair assist level: Supervision/Verbal cueing Max wheelchair distance: 150  Wheel 50 feet with 2 turns activity   Assist Level: Supervision/Verbal cueing  Wheel 150 feet activity   Assist Level: Supervision/Verbal cueing    Refer to Care Plan  for Long Term Goals  SHORT TERM GOAL WEEK 1 PT Short Term Goal 1 (Week 1): =  LTGs due to ELOS  Recommendations for other services: None   Skilled Therapeutic Intervention  Pt received resting in bed.  His dysarthria and stuttering is limiting for communication, but his speech does improve with cues for slower speech.  Evalutuation, D/C plan and functional status at dc explained to pt.  Pt performed wc propulsion x 150' with RUE ataxia apparent initially but improving quickly.  He locked/unlocked brakes with min assistance. neuromuscular re-education via demo and multimodal cues for coordination of heel slides RLE, in supine and sitting, with focusing on slower speed.  At conclusion of session, pt resting in bed with HOB raised, alarm set and needs at hand. Mobility Bed Mobility Bed Mobility: Rolling Right;Rolling Left Rolling Right: Supervision/verbal cueing Rolling Left: Supervision/Verbal cueing Supine to Sit: Supervision/Verbal cueing Transfers Transfers: Stand Pivot Transfers Stand Pivot Transfers: Minimal Assistance - Patient > 75% Stand Pivot Transfer Details: Manual facilitation for weight shifting;Verbal cues for precautions/safety Stand Pivot Transfer Details (indicate cue type and reason): VCs for hand placement Transfer (Assistive device): None Locomotion  Gait Ambulation: Yes Gait Assistance: Moderate Assistance - Patient 50-74% Gait Distance (Feet): 150 Feet Assistive device: None;1 person hand held assist Gait Assistance Details: Manual facilitation for weight shifting;Verbal cues for precautions/safety Gait Gait: Yes Gait Pattern: Impaired Gait Pattern: Decreased hip/knee flexion - right;Decreased weight shift to right;Decreased dorsiflexion - right;Trunk rotated posteriorly on right;Narrow base of support;Ataxic;Decreased trunk rotation (lack of push off bil, R lacks> L) Gait velocity: NT Stairs / Additional Locomotion Stairs: Yes Stairs Assistance: Minimal  Assistance - Patient > 75% Stair Management Technique: Two rails Number of Stairs: 12 Height of Stairs: 6 (and 4) Ramp: Moderate Assistance - Patient 50 - 74% Wheelchair Mobility Wheelchair Mobility: Yes Wheelchair Assistance: Chartered loss adjuster: Both upper extremities Wheelchair Parts Management: Needs assistance Distance: 150   Discharge Criteria: Patient will be discharged from PT if patient refuses treatment 3 consecutive times without medical reason, if treatment goals not met, if there is a change in medical status, if patient makes no progress towards goals or if patient is discharged from hospital.  The above assessment, treatment plan, treatment alternatives and goals were discussed and mutually agreed upon: by patient  Leanora Murin 06/06/2022, 6:03 PM

## 2022-06-06 NOTE — Evaluation (Addendum)
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 ([place]) Active Problems:   Left pontine stroke ([place])   Past Medical History:  Past Medical History:  Diagnosis Date   Asthma    Avascular necrosis of bone of hip ([place])    Blood clot in vein    Cancer ([place])    Coronary artery disease    GERD (gastroesophageal reflux disease)    Heart disease    Hypertension    Pneumonia    Past Surgical History:  Past Surgical History:  Procedure Laterality Date   FEMORAL BYPASS     MANDIBLE SURGERY     WRIST SURGERY      Assessment & Plan Clinical Impression: Timothy Galvan is a 66 year old male with history of CAD, colon CA, PAD s/p FFBG, polysubstance abuse (cocaine, ETOH, tobacco), recent TIA 02/2022;  who was admitted on 06/01/22 to [facility] with 3 day history of dizziness, balance problems with difficulty walking, wheezing and stuttering. UDS positive for cocaine. CTA head/neck was negative for LVO. MRI/MRA brain done revealing acute to subacute infarcts in left basal ganglia and right pons without hemorrhage, unchanged occlusion of R-ICA and unchanged severe stenosis L-ICA with focal moderate to severe stenosis of distal right M1 segment and short segment severe stenosis of L P2/P3 segment. 2D echo done revealing mild decrease in LVF 45-50% with mild decrease in function and mild concentric LVH.  Stroke felt to be embolic and 30 day event monitor to be mailed to his home. Patient transferred to  [facility] on 06/05/2022 .    Patient currently requires min with basic self-care skills secondary to decreased cardiorespiratoy endurance, unbalanced muscle activation, ataxia, decreased coordination, and decreased motor planning, decreased visual perceptual skills and decreased visual motor skills, decreased attention, decreased awareness, decreased problem solving, decreased safety awareness, and decreased memory, and decreased standing balance, decreased postural control, hemiplegia, and decreased balance strategies.  Prior to hospitalization, patient could complete ADL/Light IADL with independent .  Patient will benefit from skilled intervention to decrease level of assist with basic self-care skills, increase independence with basic self-care skills, and increase level of independence with iADL prior to discharge home independently.  Anticipate patient will require intermittent supervision and follow up home health.  OT - End of Session Activity Tolerance: Decreased this session Endurance Deficit: Yes Endurance Deficit Description: Becomes short of breath with dressing activity - reports fairly sedentary lifestyle OT Assessment Rehab Potential (ACUTE ONLY): Good OT Barriers to Discharge: Decreased caregiver support;Lack of/limited family support OT Barriers to Discharge Comments: Children live out of town, patient lived alone prior with intermittent support from paid caregiver and friends. OT Patient demonstrates impairments in the following area(s): Balance;Motor;Sensory;Behavior;Cognition;Vision;Safety;Endurance OT Basic ADL's Functional Problem(s): Eating;Grooming;Bathing;Dressing;Toileting OT Advanced ADL's Functional Problem(s): Simple Meal Preparation;Laundry OT Transfers Functional Problem(s): Toilet;Tub/Shower OT Additional Impairment(s): Fuctional Use of Upper Extremity OT Plan OT Intensity: Minimum of 1-2 x/day, 45 to 90 minutes OT Frequency: 5 out of 7 days OT Duration/Estimated  Length of Stay: 7-9 days OT Treatment/Interventions: Balance/vestibular training;Disease mangement/prevention;Neuromuscular re-education;Patient/family education;Self Care/advanced ADL  retraining;Therapeutic Exercise;UE/LE Coordination activities;Cognitive remediation/compensation;Discharge planning;DME/adaptive equipment instruction;Functional mobility training;Therapeutic Activities;UE/LE Strength taining/ROM;Visual/perceptual remediation/compensation OT Self Feeding Anticipated Outcome(s): Mod I (food from plate to mouth) OT Basic Self-Care Anticipated Outcome(s): Mod I OT Toileting Anticipated Outcome(s): Mod I OT Bathroom Transfers Anticipated Outcome(s): Mod I OT Recommendation Patient destination: Home Follow Up Recommendations: Home health OT Equipment Recommended: To be determined Equipment Details: Patient reports he has a tub transfer bench   OT Evaluation Precautions/Restrictions  Precautions Precautions: Fall Precaution Comments: Diet restrictions - nectar, edentulous Restrictions Weight Bearing Restrictions: No   Pain Pain Assessment Pain Scale: 0-10 Pain Score: 0-No pain Home Living/Prior Functioning Home Living Family/patient expects to be discharged to:: Private residence Living Arrangements: Alone Available Help at Discharge: Family, Available PRN/intermittently (has a support lady) Type of Home: Apartment Home Access: Level entry Home Layout: One level Bathroom Shower/Tub: Optometrist: Yes  Lives With: Alone IADL History Homemaking Responsibilities: Yes Meal Prep Responsibility: Primary (usually uses crock pots for increased safety) Laundry Responsibility: Primary Cleaning Responsibility: No (has support for cleaning) Occupation: Retired Type of Occupation: made boxes Leisure and Hobbies: watch TV, Sit outside on the [place]: On disability Vision Baseline Vision/History: 1  Wears glasses Ability to See in Adequate Light: 1 Impaired Patient Visual Report: Eye fatigue/eye pain/headache Vision Assessment?: Yes Eye Alignment: Impaired (comment) Ocular Range of Motion: Restricted looking up;Restricted looking down;Restricted on the right Alignment/Gaze Preference: Within Defined Limits Tracking/Visual Pursuits: Decreased smoothness of vertical tracking;Impaired - to be further tested in functional context Saccades: Decreased speed of saccadic movement;Additional eye shifts occurred during testing Convergence: Impaired - to be further tested in functional context Visual Fields: Impaired-to be further tested in functional context Depth Perception: Overshoots Additional Comments: poor oculomotor control Perception  Perception: Impaired Praxis Praxis: Impaired Praxis Impairment Details: Motor planning Cognition Cognition Arousal/Alertness: Awake/alert Orientation Level: Person;Place;Situation Person: Oriented Place: Oriented Situation: Oriented Memory: Impaired Memory Impairment: Decreased short term memory;Decreased recall of new information;Retrieval deficit Decreased Short Term Memory: Functional complex Attention: Sustained Sustained Attention: Impaired Sustained Attention Impairment: Functional basic Awareness: Impaired Awareness Impairment: Emergent impairment Problem Solving: Appears intact Executive Function: Self Correcting;Self Monitoring;Organizing;Decision Making Organizing: Impaired Organizing Impairment: Functional basic;Functional complex Decision Making: Impaired Decision Making Impairment: Functional basic;Functional complex Self Monitoring: Impaired Self Monitoring Impairment: Functional basic;Functional complex Self Correcting: Impaired Self Correcting Impairment: Functional basic;Functional complex Behaviors: Perseveration;Impulsive Safety/Judgment: Impaired Brief Interview for Mental Status (BIMS) Repetition of Three Words  (First Attempt): 3 Temporal Orientation: Year: Correct Temporal Orientation: Month: Accurate within 5 days Temporal Orientation: Day: Correct Recall: "Sock": Yes, no cue required Recall: "Blue": Yes, no cue required Recall: "Bed": Yes, no cue required BIMS Summary Score: 15 Sensation Sensation Light Touch: Appears Intact Hot/Cold: Appears Intact Proprioception: Impaired by gross assessment Stereognosis: Not tested Coordination Gross Motor Movements are Fluid and Coordinated: No Fine Motor Movements are Fluid and Coordinated: No Finger Nose Finger Test: difficulty following instructions - undershooting Motor  Motor Motor: Ataxia;Motor impersistence;Hemiplegia Motor - Skilled Clinical Observations: Mild right weakness, ataxic quality to movement  Trunk/Postural Assessment  Cervical Assessment Cervical Assessment: Within Functional Limits Thoracic Assessment Thoracic Assessment: Within Functional Limits Lumbar Assessment Lumbar Assessment: Within Functional Limits Postural Control Postural Control: Deficits on evaluation Trunk Control: Can move pelvis anterior / posterior - has posterior bias in sitting Righting Reactions: delayed  Balance Balance Balance Assessed: Yes Static Sitting Balance Static Sitting - Balance Support: Feet supported;No upper extremity supported Static Sitting - Level of Assistance: 5: Stand  by assistance Dynamic Sitting Balance Dynamic Sitting - Balance Support: Feet supported;No upper extremity supported;During functional activity Dynamic Sitting - Level of Assistance: 5: Stand by assistance Dynamic Sitting - Balance Activities: Lateral lean/weight shifting;Forward lean/weight shifting Sitting balance - Comments: seated EOB Static Standing Balance Static Standing - Balance Support: During functional activity;Right upper extremity supported;Left upper extremity supported Static Standing - Level of Assistance: 4: Min assist Dynamic Standing  Balance Dynamic Standing - Balance Support: Right upper extremity supported;Left upper extremity supported;During functional activity Dynamic Standing - Level of Assistance: 4: Min assist Extremity/Trunk Assessment RUE Assessment RUE Assessment: Exceptions to [facility] Active Range of Motion (AROM) Comments: Limited to 120* shoulder flexion, elbow flex limited due to dressing over IV General Strength Comments: NT RUE Body System: Neuro Brunstrum levels for arm and hand: Arm;Hand Brunstrum level for arm: Stage V Relative Independence from Synergy Brunstrum level for hand: Stage VI Isolated joint movements LUE Assessment LUE Assessment: Within Functional Limits General Strength Comments: History of prior stroke  Care Tool Care Tool Self Care Eating   Eating Assist Level: Minimal Assistance - Patient > 75%    Oral Care    Oral Care Assist Level: Minimal Assistance - Patient > 75%    Bathing   Body parts bathed by patient: Right arm;Left arm;Chest;Abdomen;Front perineal area;Buttocks;Right upper leg;Left upper leg;Right lower leg;Left lower leg;Face     Assist Level: Minimal Assistance - Patient > 75%    Upper Body Dressing(including orthotics)   What is the patient wearing?: Pull over shirt   Assist Level: Minimal Assistance - Patient > 75%    Lower Body Dressing (excluding footwear)   What is the patient wearing?: Incontinence brief;Pants Assist for lower body dressing: Minimal Assistance - Patient > 75%    Putting on/Taking off footwear   What is the patient wearing?: Socks Assist for footwear: Minimal Assistance - Patient > 75%       Care Tool Toileting Toileting activity   Assist for toileting: Minimal Assistance - Patient > 75%     Care Tool Bed Mobility Roll left and right activity   Roll left and right assist level: Minimal Assistance - Patient > 75%    Sit to lying activity   Sit to lying assist level: Minimal Assistance - Patient > 75%    Lying to sitting on  side of bed activity   Lying to sitting on side of bed assist level: the ability to move from lying on the back to sitting on the side of the bed with no back support.: Minimal Assistance - Patient > 75%     Care Tool Transfers Sit to stand transfer   Sit to stand assist level: Minimal Assistance - Patient > 75%    Chair/bed transfer   Chair/bed transfer assist level: Minimal Assistance - Patient > 75%     Toilet transfer   Assist Level: Minimal Assistance - Patient > 75%     Care Tool Cognition  Expression of Ideas and Wants Expression of Ideas and Wants: 3. Some difficulty - exhibits some difficulty with expressing needs and ideas (e.g, some words or finishing thoughts) or speech is not clear  Understanding Verbal and Non-Verbal Content Understanding Verbal and Non-Verbal Content: 3. Usually understands - understands most conversations, but misses some part/intent of message. Requires cues at times to understand   Memory/Recall Ability Memory/Recall Ability : Current season;That he or she is in a hospital/hospital unit   Refer to Care Plan for [place]  TERM GOAL WEEK 1 OT Short Term Goal 1 (Week 1): STG= LTG due to LOS  Recommendations for other services: None    Skilled Therapeutic Intervention Patient received supine in bed talking on phone with family member.  Patient awake and alert, agreeable to OT session.   Patient chose option to shower and completed shower while seated on tub transfer bench - "Like I have at home"   Patient slightly impulsive this session.  Asking to get a wheelchair (power) to help him get to [facility].  Patient transferred bed to wheelchair with min assist - wide base of support and ataxic movement in BLE and trunk.  Patient reports vision is improving and no report of dizziness.  Patient called family to bring him clothing.  Patient left in bed with bed alarm on and personal items in reach.   ADL ADL Eating: Minimal assistance Where  Assessed-Eating: Chair Grooming: Minimal assistance Where Assessed-Grooming: Sitting at sink Upper Body Bathing: Minimal assistance Where Assessed-Upper Body Bathing: Shower Lower Body Bathing: Minimal assistance Where Assessed-Lower Body Bathing: Shower Upper Body Dressing: Setup Where Assessed-Upper Body Dressing: Sitting at sink Lower Body Dressing: Minimal assistance Where Assessed-Lower Body Dressing: Sitting at sink;Standing at sink Toileting: Minimal assistance Where Assessed-Toileting: Glass blower/designer: Psychiatric nurse Method: Counselling psychologist: Energy manager: Unable to assess Tub/Shower Transfer Method: Unable to assess Tub/Shower Equipment: Facilities manager: Environmental education officer Method: Heritage manager: Radio broadcast assistant ADL Comments: has tub transfer bench Mobility  Bed Mobility Bed Mobility: Rolling Left;Supine to Sit Rolling Left: Minimal Assistance - Patient > 75% Supine to Sit: Minimal Assistance - Patient > 75%   Discharge Criteria: Patient will be discharged from OT if patient refuses treatment 3 consecutive times without medical reason, if treatment goals not met, if there is a change in medical status, if patient makes no progress towards goals or if patient is discharged from hospital.  The above assessment, treatment plan, treatment alternatives and goals were discussed and mutually agreed upon: by patient  Mariah Milling 06/06/2022, 11:34 AM

## 2022-06-06 NOTE — Plan of Care (Signed)
Problem: RH Balance Goal: LTG: Patient will maintain dynamic sitting balance (OT) Description: LTG:  Patient will maintain dynamic sitting balance with assistance during activities of daily living (OT) Flowsheets (Taken 06/06/2022 1141) LTG: Pt will maintain dynamic sitting balance during ADLs with: Independent Goal: LTG Patient will maintain dynamic standing with ADLs (OT) Description: LTG:  Patient will maintain dynamic standing balance with assist during activities of daily living (OT)  Flowsheets (Taken 06/06/2022 1141) LTG: Pt will maintain dynamic standing balance during ADLs with: Independent with assistive device   Problem: Sit to Stand Goal: LTG:  Patient will perform sit to stand in prep for activites of daily living with assistance level (OT) Description: LTG:  Patient will perform sit to stand in prep for activites of daily living with assistance level (OT) Flowsheets (Taken 06/06/2022 1141) LTG: PT will perform sit to stand in prep for activites of daily living with assistance level: Independent with assistive device   Problem: RH Eating Goal: LTG Patient will perform eating w/assist, cues/equip (OT) Description: LTG: Patient will perform eating with assist, with/without cues using equipment (OT) Flowsheets (Taken 06/06/2022 1141) LTG: Pt will perform eating with assistance level of: Independent   Problem: RH Grooming Goal: LTG Patient will perform grooming w/assist,cues/equip (OT) Description: LTG: Patient will perform grooming with assist, with/without cues using equipment (OT) Flowsheets (Taken 06/06/2022 1141) LTG: Pt will perform grooming with assistance level of: Independent   Problem: RH Bathing Goal: LTG Patient will bathe all body parts with assist levels (OT) Description: LTG: Patient will bathe all body parts with assist levels (OT) Flowsheets (Taken 06/06/2022 1141) LTG: Pt will perform bathing with assistance level/cueing: Independent with assistive device   LTG: Position pt will perform bathing:  Shower  At sink   Problem: RH Dressing Goal: LTG Patient will perform upper body dressing (OT) Description: LTG Patient will perform upper body dressing with assist, with/without cues (OT). Flowsheets (Taken 06/06/2022 1141) LTG: Pt will perform upper body dressing with assistance level of: Independent Goal: LTG Patient will perform lower body dressing w/assist (OT) Description: LTG: Patient will perform lower body dressing with assist, with/without cues in positioning using equipment (OT) Flowsheets (Taken 06/06/2022 1141) LTG: Pt will perform lower body dressing with assistance level of: Independent with assistive device   Problem: RH Toileting Goal: LTG Patient will perform toileting task (3/3 steps) with assistance level (OT) Description: LTG: Patient will perform toileting task (3/3 steps) with assistance level (OT)  Flowsheets (Taken 06/06/2022 1141) LTG: Pt will perform toileting task (3/3 steps) with assistance level: Independent with assistive device   Problem: RH Functional Use of Upper Extremity Goal: LTG Patient will use RT/LT upper extremity as a (OT) Description: LTG: Patient will use right/left upper extremity as a stabilizer/gross assist/diminished/nondominant/dominant level with assist, with/without cues during functional activity (OT) Flowsheets (Taken 06/06/2022 1141) LTG: Use of upper extremity in functional activities: RUE as diminished level LTG: Pt will use upper extremity in functional activity with assistance level of: Independent   Problem: RH Simple Meal Prep Goal: LTG Patient will perform simple meal prep w/assist (OT) Description: LTG: Patient will perform simple meal prep with assistance, with/without cues (OT). Flowsheets (Taken 06/06/2022 1141) LTG: Pt will perform simple meal prep with assistance level of: Independent with assistive device LTG: Pt will perform simple meal prep w/level of: Ambulate with device    Problem: RH Laundry Goal: LTG Patient will perform laundry w/assist, cues (OT) Description: LTG: Patient will perform laundry with assistance, with/without cues (OT). Flowsheets (Taken  06/06/2022 1141) LTG: Pt will perform laundry with assistance level of: Independent with assistive device LTG: Pt will perform laundry with level of: Ambulate with device   Problem: RH Toilet Transfers Goal: LTG Patient will perform toilet transfers w/assist (OT) Description: LTG: Patient will perform toilet transfers with assist, with/without cues using equipment (OT) Flowsheets (Taken 06/06/2022 1141) LTG: Pt will perform toilet transfers with assistance level of: Independent with assistive device   Problem: RH Tub/Shower Transfers Goal: LTG Patient will perform tub/shower transfers w/assist (OT) Description: LTG: Patient will perform tub/shower transfers with assist, with/without cues using equipment (OT) Flowsheets (Taken 06/06/2022 1141) LTG: Pt will perform tub/shower stall transfers with assistance level of: Independent with assistive device LTG: Pt will perform tub/shower transfers from: Tub/shower combination   Problem: RH Attention Goal: LTG Patient will demonstrate this level of attention during functional activites (OT) Description: LTG:  Patient will demonstrate this level of attention during functional activites  (OT) Flowsheets (Taken 06/06/2022 1141) Patient will demonstrate this level of attention during functional activites: Selective Patient will demonstrate above attention level in the following environment: Home LTG: Patient will demonstrate this level of attention during functional activites (OT): Independent   Problem: RH Awareness Goal: LTG: Patient will demonstrate awareness during functional activites type of (OT) Description: LTG: Patient will demonstrate awareness during functional activites type of (OT) Flowsheets (Taken 06/06/2022 1141) Patient will demonstrate awareness  during functional activites type of: Emergent LTG: Patient will demonstrate awareness during functional activites type of (OT): Independent

## 2022-06-07 LAB — TROPONIN I (HIGH SENSITIVITY)
Troponin I (High Sensitivity): 13 ng/L (ref <18)
Troponin I (High Sensitivity): 12 ng/L (ref <18)

## 2022-06-08 LAB — BASIC METABOLIC PANEL
Anion gap: 9 (ref 5–15)
BUN: 13 mg/dL (ref 8–23)
CO2: 24 mmol/L (ref 22–32)
Calcium: 9.5 mg/dL (ref 8.9–10.3)
Chloride: 107 mmol/L (ref 98–111)
Creatinine, Ser: 1.04 mg/dL (ref 0.61–1.24)
GFR, Estimated: >60 mL/min (ref >60)
Glucose, Bld: 105 mg/dL — ABNORMAL HIGH (ref 70–99)
Potassium: 3.9 mmol/L (ref 3.5–5.1)
Sodium: 140 mmol/L (ref 135–145)

## 2022-06-08 LAB — CBC
HCT: 41.5 % (ref 39.0–52.0)
Hemoglobin: 13.6 g/dL (ref 13.0–17.0)
MCH: 29.2 pg (ref 26.0–34.0)
MCHC: 32.8 g/dL (ref 30.0–36.0)
MCV: 89.1 fL (ref 80.0–100.0)
Platelets: 250 10*3/uL (ref 150–400)
RBC: 4.66 MIL/uL (ref 4.22–5.81)
RDW: 14.7 % (ref 11.5–15.5)
WBC: 6.4 10*3/uL (ref 4.0–10.5)
nRBC: 0 % (ref 0.0–0.2)

## 2022-06-08 LAB — VITAMIN D 25 HYDROXY (VIT D DEFICIENCY, FRACTURES): Vit D, 25-Hydroxy: 16.38 ng/mL — ABNORMAL LOW (ref 30–100)

## 2022-06-08 MED ORDER — VITAMIN D (ERGOCALCIFEROL) 1.25 MG (50000 UNIT) PO CAPS
50000.0000 [IU] | ORAL_CAPSULE | ORAL | Status: DC
  Administered 2022-06-08: 50000 [IU] via ORAL
  Filled 2022-06-08: qty 1

## 2022-06-08 NOTE — Progress Notes (Signed)
Physical Therapy Session Note  Patient Details  Name: Timothy Galvan MRN: [id number] Date of Birth: [birth year]  Today's Date: 06/08/2022 PT Individual Time: 1300-1357 PT Individual Time Calculation (min): 57 min   Short Term Goals: Week 1:  PT Short Term Goal 1 (Week 1): = LTGs due to ELOS  Skilled Therapeutic Interventions/Progress Updates:     Pt sitting EOB on arrival - agreeable to therapy session. Denies any pain. Donned tennis shoes with setupA while he sat EOB. Sit<>stand to RW with supervision. Ambulates from his room to main rehab gym, ~150f, with CGA and RW. Pt reports at home he used a 4-wheeled walker and has been using this for 4 months prior to admission. Retrieved 4-wheeled walker from DME closet.   Gait training using 4-wheeled walker with supervision assist. Pt ambulating variable distances, >1559f Pt demonstrated adequate safety awareness with locking brakes during sitting and standing. He also was able to safely demonstrate sitting on the 4-wheeled walker when he became fatigued.   Car transfer completed with supervision and 4-wheeled walker - pt completed via lateral side stepping without evidence of knee buckling or LOB. Pt reports at home, he doesn't drive (had DWI >2>08ears ago and lost his license). He reports he walks to the store almost daily for his cigarettes. He also reports he has a case walker who takes him to the grocery store, bank, MD appointments, etc.   Ambulated from CIR floor to atrium lobby, >50014fwith standing rest break in elevator, with supervision assist and using 4-wheeled walker. Practiced ambulation outdoors on unlevel paved surfaces to simulate routine of walking to the store. He ambulates outdoors with supervision and 4-wheeled walker, CGA only needed while navigating steep decline/incline and safety cues for slowing down space and keeping walker close to him.   Returned upstairs to CIR floor and patient able to direct himself back to his  room with min cues. Pt ambulating well over [id number] heel drag when fatigued.   Concluded session seated EOB, alarm on, all needs met.   Therapy Documentation Precautions:  Precautions Precautions: Fall Precaution Comments: Diet restrictions - nectar, edentulous Restrictions Weight Bearing Restrictions: No General:    Therapy/Group: Individual Therapy  ChriAlger Simons27/2023, 7:55 AM

## 2022-06-08 NOTE — Progress Notes (Signed)
PROGRESS NOTE   Subjective/Complaints: No new complaints this morning.  He would prefer to d/c home with daughter than with his siblings Discussed stable labs  ROS: Denies fevers, chills, N/V, abdominal pain, constipation, diarrhea, SOB, cough, chest pain, new weakness or paraesthesias.    Objective:   No results found. Recent Labs    06/06/22 0610 06/08/22 0513  WBC 7.1 6.4  HGB 13.2 13.6  HCT 40.4 41.5  PLT 233 250   Recent Labs    06/06/22 0610 06/08/22 0513  NA 140 140  K 4.0 3.9  CL 108 107  CO2 23 24  GLUCOSE 107* 105*  BUN 9 13  CREATININE 1.18 1.04  CALCIUM 9.1 9.5    Intake/Output Summary (Last 24 hours) at 06/08/2022 1238 Last data filed at 06/08/2022 0700 Gross per 24 hour  Intake 795 ml  Output 1900 ml  Net -1105 ml        Physical Exam: Vital Signs Blood pressure 117/67, pulse (!) 59, temperature 98.6 F (37 C), resp. rate 18, SpO2 95 %. Constitutional: No apparent distress. Appropriate appearance for age.  HENT: + mild JVD. Neck Supple. Trachea midline. Atraumatic, normocephalic. +Edentulous maxilla Eyes: PERRLA. EOMI Cardiovascular: Bradycardia, no murmurs/rub/gallops. No Edema. Peripheral pulses 2+. +Blunting nails Respiratory: CTAB.No rales, rhonchi, or wheezing. . On RA.  Abdomen: + bowel sounds, normoactive. No distention or tenderness.  GU: Not examined.  Skin: C/D/I. No apparent lesions. MSK:      No apparent deformity.      Strength:                RUE: 5/5 SA, 5/5 EF, 5/5 EE, 5/5 WE, 5/5 FF, 5/5 FA                 LUE: 4/5 SA, 4/5 EF, 4/5 EE, 4/5 WE, 4/5 FF, 4/5 FA                 RLE: 5/5 HF, 5/5 KE, 5/5 DF, 5/5 EHL, 5/5 PF                 LLE:  5-/5 HF, 5-/5 KE, 5/5 DF, 5/5 EHL, 5/5 PF    Neurologic exam:  Cognition: AAO to person, place, time and event.  Language: Fluent, No substitutions or neoglisms. + Severe dysarthria.  Insight: Good insight into current  condition.  Mood: Pleasant affect, appropriate mood. CN: + decreased sensation in L V2-3.  Coordination: No apparent tremors. + mild R ataxia      Assessment/Plan: 1. Functional deficits which require 3+ hours per day of interdisciplinary therapy in a comprehensive inpatient rehab setting. Physiatrist is providing close team supervision and 24 hour management of active medical problems listed below. Physiatrist and rehab team continue to assess barriers to discharge/monitor patient progress toward functional and medical goals  Care Tool:  Bathing    Body parts bathed by patient: Right arm, Left arm, Chest, Abdomen, Front perineal area, Buttocks, Right upper leg, Left upper leg, Right lower leg, Left lower leg, Face         Bathing assist Assist Level: Supervision/Verbal cueing     Upper Body Dressing/Undressing Upper body dressing  What is the patient wearing?: Pull over shirt    Upper body assist Assist Level: Set up assist    Lower Body Dressing/Undressing Lower body dressing      What is the patient wearing?: Incontinence brief, Pants     Lower body assist Assist for lower body dressing: Set up assist     Toileting Toileting    Toileting assist Assist for toileting: Supervision/Verbal cueing Assistive Device Comment: walker   Transfers Chair/bed transfer  Transfers assist     Chair/bed transfer assist level: Minimal Assistance - Patient > 75%     Locomotion Ambulation   Ambulation assist      Assist level: Moderate Assistance - Patient 50 - 74% Assistive device: No Device Max distance: 150   Walk 10 feet activity   Assist     Assist level: Moderate Assistance - Patient - 50 - 74% Assistive device: No Device, Hand held assist   Walk 50 feet activity   Assist    Assist level: Moderate Assistance - Patient - 50 - 74% Assistive device: No Device, Hand held assist    Walk 150 feet activity   Assist    Assist level: Moderate  Assistance - Patient - 50 - 74% Assistive device: No Device, Hand held assist    Walk 10 feet on uneven surface  activity   Assist     Assist level: Moderate Assistance - Patient - 50 - 74% Assistive device: Hand held assist   Wheelchair     Assist Is the patient using a wheelchair?: Yes Type of Wheelchair: Manual    Wheelchair assist level: Supervision/Verbal cueing Max wheelchair distance: 150    Wheelchair 50 feet with 2 turns activity    Assist        Assist Level: Supervision/Verbal cueing   Wheelchair 150 feet activity     Assist      Assist Level: Supervision/Verbal cueing   Blood pressure 117/67, pulse (!) 59, temperature 98.6 F (37 C), resp. rate 18, SpO2 95 %.   Medical Problem List and Plan: 1. 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  H/o of DVT?/Antithrombotics: -DVT/anticoagulation:  Pharmaceutical: Lovenox              -antiplatelet therapy: Plavix/ASA X 3 months and is to follow with IR closely as OP for stenting on discharge (L ICA only; R ICA chronic total occlusion not a candidate for revascularization)   3. Pain Management: Gabapentin 600 mg TID             --avoid narcotics with polysubstance hx--was not taking hydrocodone, ultram or mobic at home.   4. Mood/Behavior/Sleep: LCSW to follow for evaluation and support.             -antipsychotic agents: N/A   5. Neuropsych/cognition: This patient is capable of making decisions on his own behalf.   6. Skin/Wound Care: Routine pressure relief measures   7. Fluids/Electrolytes/Nutrition: Monitor I/O. Encourage nectar thick fluid intake             --  recheck BMET in am.    8. HTN: Monitor BP  TID. Continue Norvasc, Hydralazine, Cozaar      06/05/2022    8:01 PM 06/05/2022    7:40 PM 06/05/2022    4:06 PM  Vitals with BMI  Systolic 300 762 263  Diastolic 77 85 78  Pulse 76 82 64   - stable on admission   9. L-ICA stenosis: Continue DAPT w/Lipitor. Encourage tobacco cessation --Follow up with IVR or [facility] with [facility] after discharge.  10. CAD with HF/atypical CP, c/b cocaine use: Heart healthy diet. Monitor wts daily             --Continue Hydralazine, Lipitor, Cozaar,              --continue to hold metoprolol (BRADYCARDIA) and Imdur-->avoid hypotension.      11. H/o Asthma: Stable on Incurse  MDI 12. Polysubstance abuse: Continues to use tobacco, ETOH and cocaine --Encourage cessation 13.  PAD s/p Fem-Fem BG:/stent distal aorta: Encourage tobacco cessation.             --on Lipitor and DAPT 14. Anxiety d/o: Monitor closely 15. Persistent hypokalemia/AKI: K+- 2.9 at admission and improved with supplementation; monitor - K 4.0; Cr slight increase 1.18 oi admission labs -> Encourage PO fluids and repeat Monday  16. Hypomagnesemia: Mg-1.6-->Improved with supplement yesterday --will start daily supplement as likely affecting K+ levels. - 11.25: Mag 1.7; stable - monitor 17. Dysphagia: Continue D2, advance to thin liquids. Encourage fluid intake.  18. ETOH withdrawal: Was on Valium X 3 days -->Finished CIWA protocol             --On Thiamine and Folic acid.  19. Hyperglycemia: encouraged avoiding foods with added sugar and bread/pasta/rice 20. Vitamin D insufficiency: start ergocalciferol 50,000U once per week for 7 weeks   LOS: 3 days A FACE TO FACE EVALUATION WAS PERFORMED  [name] Krist Rosenboom 06/08/2022, 12:38 PM

## 2022-06-08 NOTE — IPOC Note (Signed)
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 ([place]) Active Problems:   Left pontine stroke ([place])     Functional Problem List: Nursing Endurance, Medication Management, Nutrition, Safety  PT Balance, Pain, Safety, Endurance, Sensory, Motor  OT Balance, Motor, Sensory, Behavior, Cognition, Vision, Safety, Endurance  SLP Cognition, Safety, Motor  TR         Basic ADL's: OT Eating, Grooming, Bathing, Dressing, Toileting     Advanced  ADL's: OT Simple Meal Preparation, Laundry     Transfers: PT Bed Mobility, Bed to Chair, Car, [employer], Futures trader, Metallurgist: PT Ambulation, Emergency planning/management officer, Stairs     Additional Impairments: OT Fuctional Use of Upper Extremity  SLP Swallowing, Communication, Social Cognition expression Problem Solving, Memory, Attention, Awareness  TR      Anticipated Outcomes Item Anticipated Outcome  Self Feeding Mod I (food from plate to mouth)  Swallowing  Supervision   Basic self-care  Mod I  Toileting  Mod I   Bathroom Transfers Mod I  Bowel/Bladder  n/a  Transfers  modified independent  basic , supervision car and floor  Locomotion  supervision in controlled and community settings x 150' wiht LRAD; modified independent in household x 16' with LRAD; supervision up/down 12 steps 1 railing as per sister's house; modified independent up/down threshold for emergency exit of  apartment; superivsion in controlled setting wc x 150'  Communication  Supervision  Cognition  Supervision  Pain  n/a  Safety/Judgment  manage w cues   Therapy Plan: PT Intensity: Minimum of 1-2 x/day ,45 to 90 minutes PT Frequency: 5 out of 7 days PT Duration Estimated Length of  Stay: 7-9 OT Intensity: Minimum of 1-2 x/day, 45 to 90 minutes OT Frequency: 5 out of 7 days OT Duration/Estimated Length of Stay: 7-9 days SLP Intensity: Minumum of 1-2 x/day, 30 to 90 minutes SLP Frequency: 3 to 5 out of 7 days SLP Duration/Estimated Length of Stay: 7-9 days   Team Interventions: Nursing Interventions Patient/Family Education, Disease Management/Prevention, Discharge Planning, Medication Management, Dysphagia/Aspiration Precaution Training  PT interventions Ambulation/gait training, Cognitive remediation/compensation, Discharge planning, DME/adaptive equipment instruction, Functional mobility training, Pain management, Psychosocial support, Splinting/orthotics, Therapeutic Activities, UE/LE Strength taining/ROM, Training and development officer, Community reintegration, Technical sales engineer stimulation, Neuromuscular re-education, Patient/family education, IT trainer, Therapeutic Exercise, UE/LE Coordination activities, Wheelchair propulsion/positioning  OT Interventions Training and development officer, Disease mangement/prevention, Neuromuscular re-education, Patient/family education, Self Care/advanced ADL retraining, Therapeutic Exercise, UE/LE Coordination activities, Cognitive remediation/compensation, Discharge planning, DME/adaptive equipment instruction, Functional mobility training, Therapeutic Activities, UE/LE Strength taining/ROM, Visual/perceptual remediation/compensation  SLP Interventions Cognitive remediation/compensation, Dysphagia/aspiration precaution training, Internal/external aids, Therapeutic Activities, Therapeutic Exercise, Patient/family education, Functional tasks, Cueing hierarchy, Environmental controls  TR Interventions    SW/CM Interventions Discharge Planning, Psychosocial Support, Patient/Family Education   Barriers to Discharge MD  Medical stability  Nursing Lack of/limited family support 1 level/level entry solo; sister and nephew to check on the  patient  PT      OT Decreased caregiver support, Lack of/limited family support Children live out of town, patient lived alone prior with intermittent support from paid caregiver and friends.  SLP Decreased caregiver support    SW Medication compliance, Insurance for SNF coverage     Team Discharge Planning: Destination: PT-Home ,OT- Home , SLP-Home Projected  Follow-up: PT-Home health PT (possibly OPPT if transportation is available), OT-  Home health OT, SLP-Home Health SLP Projected Equipment Needs: PT-To be determined, OT- To be determined, SLP-To be determined Equipment Details: PT-owns rollator, RW, SPC, OT-Patient reports he has a tub transfer bench Patient/family involved in discharge planning: PT- Patient,  OT-Patient, SLP-Patient  MD ELOS: 7-10 days Medical Rehab Prognosis:  Excellent Assessment: The patient has been admitted for CIR therapies with the diagnosis of L basal ganglia and R pons strokes. The team will be addressing functional mobility, strength, stamina, balance, safety, adaptive techniques and equipment, self-care, bowel and bladder mgt, patient and caregiver education. Goals have been set at modI/S. Anticipated discharge destination is home.        See Team Conference Notes for weekly updates to the plan of care

## 2022-06-08 NOTE — Progress Notes (Addendum)
Occupational Therapy Session Note  Patient Details  Name: Timothy Galvan MRN: [id number] Date of Birth: November 06, 1955  Today's Date: 06/08/2022 OT Individual Time: 4098-1191 OT Individual Time Calculation (min): 72 min    Short Term Goals: Week 1:  OT Short Term Goal 1 (Week 1): STG= LTG due to LOS  Skilled Therapeutic Interventions/Progress Updates:  Pt received for skilled OT session seated EOB with nursing staff present. OT session focused on ADL retraining, pt agreeable to interventions, demonstrating an overall excited mood. Pt with no reports of pain this session.   Pt completes shower, full body dressing, oral care, and shaves his face with item retrieval and CGA-SUP for standing balance. Pt remains seated on TTB during shower/dressing, using grab bars for standing balance to complete peri-hygiene and don pants over hips with no physical assist. Sitting in WC at sink-level, pt performs oral care and shaves his face, requiring min safety cues when using razor for skin integrity.   Pt then ambulates household-level distance, room>apartment, using RW + CGA and cuing for safe RW management ("stay within the rectangle"). In simulated apartment, pt performs tub transfer onto TTB with RW and close supervision. Pt states he has grab bars and HH shower head in his home tub/shower environment, but is unsure of his daughter's bathroom setup (potential discharge location). Pt then ambulates halfway back to his room, requiring a standing rest-break, transiting to dependent WC push due to time constraints.   Pt performs multiple STS transfers throughout session using RW + CGA-close SUP, needing verbal cuing for safe hand placement. Pt performs EOB>sup with supervision and verbal cuing for positioning.   Pt remained sitting in bed with all immediate needs meet and bed alarm activated at end of session. Pt continues to be appropriate for skilled OT intervention to promote further functional independence.     Therapy Documentation Precautions:  Precautions Precautions: Fall Precaution Comments: Diet restrictions - nectar, edentulous Restrictions Weight Bearing Restrictions: No   Therapy/Group: Individual Therapy  Alessandra Bevels 06/08/2022, 11:47 AM

## 2022-06-08 NOTE — Progress Notes (Signed)
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 [facility]) Active Problems:   Left pontine stroke [facility])  Past Medical History:  Past Medical History:  Diagnosis Date   Asthma    Avascular necrosis of bone of hip ([place])    Blood clot in vein    Cancer ([place])    Coronary artery disease    GERD (gastroesophageal reflux disease)    Heart disease    Hypertension    Pneumonia    Past Surgical History:  Past Surgical History:  Procedure Laterality Date   FEMORAL BYPASS     MANDIBLE SURGERY     WRIST SURGERY     Social History:  reports that he has been smoking cigarettes. He has been smoking an average of 1 pack per day. He has never used smokeless tobacco. He reports current alcohol use of about 12.0 standard drinks of alcohol per week. He reports that he does not currently use drugs after having used the following drugs: Cocaine.  Family / Support Systems Marital Status: Single Patient Roles: Parent, Other (Comment) (retiree) Children: Dana-daughter [phone] Other Supports: Jimmy-brother [phone]  Betty-sister [phone] Anticipated Caregiver: Hinton Dyer has gotten permission to work remotely so pt will go home with her at DC Ability/Limitations of Caregiver: Min level Caregiver Availability: 24/7 Family Dynamics: Close with daughter and close with siblings pt is very much independent and used to do on his own. Pt does not like relying upon others  Social History Preferred language: English Religion: None Cultural Background: No issues Education: [place] - How often do you need to have someone help you when you read instructions, pamphlets, or other written material from your doctor or pharmacy?: Never Writes: Yes Employment Status: Disabled Special educational needs teacher Issues: No issues Guardian/Conservator: None-according to MD pt is capable of making his own decisions while here. Daughter plans to be involved while here   Abuse/Neglect Abuse/Neglect Assessment Can Be Completed: Yes Physical Abuse: Denies Verbal Abuse: Denies Sexual Abuse: Denies Exploitation of patient/patient's resources: Denies Self-Neglect: Denies  Patient response to: Social Isolation - How often do you feel lonely or isolated from those around you?: Never  Emotional Status Pt's affect, behavior and adjustment status: Pt is motivated to do well and regain his independence he is moving better he has speech deficits and at tomes is difficult to understand. He is used to do on his own Recent Psychosocial Issues: other health issues Psychiatric History: No history would benefit from seeing neuro-psych while here due to his substance abuse issues Substance Abuse History: Ws positvie for cocaine and tobacco. He feels it is not an issues and declines resources. Will ask neuro-psych to see while here  Patient / Family Perceptions, Expectations & Goals Pt/Family understanding of illness & functional limitations: Pt seems to have a basic understanding of his stroke, he does ask questions. His daughter is aware and has spoken with the MD and feels she understands his treatment plan moving forward. Premorbid pt/family roles/activities: father, sibling, neighbor, friend, etc Anticipated changes in roles/activities/participation: resume Pt/family expectations/goals: Pt states: " I want to do better.'  Daughter states: " I hope he does well there but will assist him when he comes home."  [employer]: None Premorbid Home Care/DME Agencies: Other (Comment) (rollator, rw and cane) Transportation available at discharge:  relied upon others did not drive PTA Is the patient able to respond to transportation needs?: Yes In the past 12 months, has lack  of transportation kept you from medical appointments or from getting medications?: No In the past 12 months, has lack of transportation kept you from meetings, work, or from getting things needed for daily living?: No Resource referrals recommended: Neuropsychology  Discharge Planning Living Arrangements: Alone Support Systems: Children, Other relatives, Friends/neighbors Type of Residence: Private residence Insurance Resources: Multimedia programmer (specify) Primary school teacher) Financial Resources: SSD Financial Screen Referred: No Living Expenses: Rent Money Management: Patient Does the patient have any problems obtaining your medications?: No Home Management: self Patient/Family Preliminary Plans: Go to daughter';s home at discharge so he will have 24/7 care. Daughter has gotten permission to work remotely so can be there with him at discharge. Care Coordinator Barriers to Discharge: Medication compliance, Insurance for SNF coverage Care Coordinator Anticipated Follow Up Needs: HH/OP  Clinical Impression Pleasant yet dysarthric from CVA, at times difficult to understand. His daughter is very involved and will be taking him to her home at discharge. Will work on discharge needs and have neuro-psych see while here  Elease Hashimoto 06/08/2022, 1:37 PM

## 2022-06-08 NOTE — Progress Notes (Signed)
Speech Language Pathology Daily Session Note  Patient Details  Name: Timothy Galvan MRN: [id number] Date of Birth: 03-27-1956  Today's Date: 06/08/2022 SLP Individual Time: 4166-0630 SLP Individual Time Calculation (min): 60 min  Short Term Goals: Week 1: SLP Short Term Goal 1 (Week 1): STG = LTG d/t ELOS  Skilled Therapeutic Interventions: S: Pt seen this date for skilled ST intervention targeting swallowing and speech intelligibility  goals outlined in care plan. Pt received awake/alert and OOB in recliner chair, eating breakfast. Denies pain. Agreeable to ST intervention in hospital room. Pleasant and participatory throughout.  O: Today's session with emphasis on education + implementation of aspiration precautions, attention, emergent awareness, and speech intelligibility within the context of conversation.  Pt benefited from [place] A verbal cues for implementation of safe swallowing strategies to include slow rate, consuming only small + single bites/sips, cease talking when eating, swallowing all solids/clearing oral cavity prior to drinking liquids (to avoid mixed consistencies), and upright positioning. During intake, cough was noted post-prandially x 2 when consuming Dysphagia 3 textures; question this to be due to decreased attention to bolus vs pharyngeal stasis given these instances only occurred when pt was talking right after swallow initiation occurred. One instance of throat clear noted with thin liquid via 10 cc Provale Cup with pt reporting difficulty with cup and that he feels he is taking in air. No clinical s/sx concerning for airway compromise noted with small + single sips of thin liquid via cup and straw with pt demonstrating good recall and carryover re: rationale and purpose for slowing his rate of intake. Sup A for sustained attention to meal and recalled aspiration precautions at the end of today's session, following a delay, with Sup A verbal cues. Continues to require Min A  verbal cues for slowing his speech rate to improve speech intelligibility; however, does not always successfully implement despite being able to verbalize this need. Without use of speech intelligibility strategies, pt is only ~60% intelligible. With use of slow rate and over-articulation, pt can improve to ~75% intelligibility at the short sentence level. Demonstrates emergent awareness re: current speech and swallowing deficits with Sup A.  A: Pt appears sitmulable for skilled ST intervention as evident by improvement in impulsivity, when drinking liquids, with mass practice and verbal cueing. Recommend continuation of current diet textures (Dysphagia 3) with small sips of thin liquid, which may be given in cup or by straw. Continue to recommend intermittent supervision to monitor safety with intake; interdisciplinary team notified of recommendations. Will plan to trial voice-to-text or audio recording and structured speech task to facilitate improved intelligibility next session.  P: Pt left OOB in recliner chair with all safety measures activated. Call bell reviewed and within reach and all immediate needs met. Continue per current ST POC next session.   Pain None reported; NAD  Therapy/Group: Individual Therapy  Dillon Mcreynolds A Emmaline Wahba 06/08/2022, 4:34 PM

## 2022-06-08 NOTE — Progress Notes (Signed)
[facility] Individual Statement of Services  Patient Name:  Timothy Galvan  Date:  06/08/2022  Welcome to the [employer].  Our goal is to provide you with an individualized program based on your diagnosis and situation, designed to meet your specific needs.  With this comprehensive rehabilitation program, you will be expected to participate in at least 3 hours of rehabilitation therapies Monday-Friday, with modified therapy programming on the weekends.  Your rehabilitation program will include the following services:  Physical Therapy (PT), Occupational Therapy (OT), Speech Therapy (ST), 24 hour per day rehabilitation nursing, Therapeutic Recreaction (TR), Neuropsychology, Care Coordinator, Rehabilitation Medicine, Nutrition Services, and Pharmacy Services  Weekly team conferences will be held on Wednesday to discuss your progress.  Your Inpatient Rehabilitation Care Coordinator will talk with you frequently to get your input and to update you on team discussions.  Team conferences with you and your family in attendance may also be held.  Expected length of stay: 7=9 days  Overall anticipated outcome: supervision-independent level  Depending on your progress and recovery, your program may change. Your Inpatient Rehabilitation Care Coordinator will coordinate services and will keep you informed of any changes. Your Inpatient Rehabilitation Care Coordinator's name and contact numbers are listed  below.  The following services may also be recommended but are not provided by the [facility]:   [facility] will be made to provide these services after discharge if needed.  Arrangements include referral to agencies that provide these services.  Your insurance has been verified to be:  [employer]-Medicare & medicaid Your primary doctor is:  [employer]  Pertinent information will be shared with your doctor and your insurance company.  Inpatient Rehabilitation Care Coordinator:  Ovidio Kin, [place] or Emilia Beck  Information discussed with and copy given to patient by: Elease Hashimoto, 06/08/2022, 1:39 PM

## 2022-06-08 NOTE — Progress Notes (Signed)
Inpatient Rehabilitation  Patient information reviewed and entered into eRehab system by Tramond Slinker M. Arriyah Madej, M.A., CCC/SLP, PPS Coordinator.  Information including medical coding, functional ability and quality indicators will be reviewed and updated through discharge.    

## 2022-06-09 MED ORDER — L-METHYLFOLATE-B6-B12 3-35-2 MG PO TABS
1.0000 | ORAL_TABLET | Freq: Every day | ORAL | Status: DC
  Administered 2022-06-09 – 2022-06-10 (×2): 1 via ORAL
  Filled 2022-06-09 (×3): qty 1

## 2022-06-09 NOTE — Progress Notes (Signed)
Patient ID: Timothy Galvan, male   DOB: 1955-12-01, 66 y.o.   MRN: [id number]  Team feels pt will be a short length of stay and will need family education prior to discharge home with daughter. Contacted daughter who can come in Thursday has appointments with son tomorrow. Will be here from 1:00-3:00 pm. Will let team know plan.

## 2022-06-09 NOTE — Progress Notes (Addendum)
Speech Language Pathology Daily Session Note  Patient Details  Name: Timothy Galvan MRN: [id number] Date of Birth: 03/01/1956  Today's Date: 06/09/2022 SLP Individual Time: 0830-0930 SLP Individual Time Calculation (min): 60 min  Short Term Goals: Week 1: SLP Short Term Goal 1 (Week 1): STG = LTG d/t ELOS  Skilled Therapeutic Interventions: S: Pt seen this date for skilled ST intervention targeting swallowing and speech intelligibility goals outlined in care plan. Pt received awake/alert and OOB in w./c; recently finished with PT.  Denies pain. Agreeable to ST intervention in dayroom. With PT approval, pt ambulated to dayroom with rollator and supervision. Pleasant and participatory throughout. Remains dysarthric.  O: Today's session with emphasis on swallowing and speech intelligibility. With set-up assistance, pt consumed therapeutic trial of regular solid consistency (peanut butter crackers) with only mildly prolonged mastication noted. Nevertheless, oral manipulation and transit appeared functional and complete with no significant oral residuals noted post-swallow. Cough noted x 1 with large sip of thin liquid via straw immediately following solid consistency; required cueing to hold PO until coughing ceased. No additional s/sx concerning for airway invasion noted when given Min A verbal cue x 1, faded to Mod I to consume only small + single sips of thin liquid via straw. Cough also noted x 1 in the absence of PO intake; ? reflux. Reviewed reflux precautions with pt given hx of GERD. Per chart review, VSS and eating 100% of meals.   Additionally, SLP provided re-education on speech intelligibility strategies (e.g. A BOSS/SLOP) to facilitate improved intelligibility within structured and spontaneous speech tasks. Pt noted to spontaneously increase vocal intensity in a mildly distracting environment to improve intelligibility, though did benefit and appeared stimulable to utilizing over  articulation and pacing to further improve intelligibility from 65-70% to 75-80% given Mod-Max A multimodal cues to include pacing board to aid in slowing pt's speaking rate and separation of words. SLP provided positive reinforcement and audio feedback of pt's intelligibility "before and after" to improve awareness re: effects of strategies; pt verbalizing improvement in intelligibility with use of strategies, and appeared pleased with progress. Pt verbalized appreciation for education. Following delay, pt recalled education with Sup A.  A: Pt appears sitmulable for skilled ST intervention as evident by subtle improvement in speech intelligibility with use of speech intelligibility strategies. Recommend f/u ST intervention at time of d/c, in addition to at least intermittent supervision and assistance. Additionally, recommend continued trials of regular textures and monitoring thin liquid tolerance with use of small + single sips/bites and slow rate of intake (Sup vs Min A).  P: Pt returned to room and remained OOB in w/c with all safety measures activated. Call bell reviewed and within reach and all immediate needs met. RN present to provide medications. Continue per current ST POC next session.   Pain Pain Assessment Pain Scale: 0-10 Pain Score: 0-No pain  Therapy/Group: Individual Therapy  Bethany A Lutes 06/09/2022, 12:09 PM 

## 2022-06-09 NOTE — Progress Notes (Signed)
PROGRESS NOTE   Subjective/Complaints: He has no new complaints Had a good night Metanx ordered Discussed d/c plan   ROS: Denies fevers, chills, N/V, abdominal pain, constipation, diarrhea, SOB, cough, chest pain, new weakness or paraesthesias.    Objective:   No results found. Recent Labs    06/08/22 0513  WBC 6.4  HGB 13.6  HCT 41.5  PLT 250   Recent Labs    06/08/22 0513  NA 140  K 3.9  CL 107  CO2 24  GLUCOSE 105*  BUN 13  CREATININE 1.04  CALCIUM 9.5    Intake/Output Summary (Last 24 hours) at 06/09/2022 1221 Last data filed at 06/09/2022 0753 Gross per 24 hour  Intake 1076 ml  Output 2000 ml  Net -924 ml        Physical Exam: Vital Signs Blood pressure 131/61, pulse 64, temperature 97.7 F (36.5 C), resp. rate 16, SpO2 95 %. Constitutional: No apparent distress. Appropriate appearance for age.  HENT: + mild JVD. Neck Supple. Trachea midline. Atraumatic, normocephalic. +Edentulous maxilla Eyes: PERRLA. EOMI Cardiovascular: Bradycardia, no murmurs/rub/gallops. No Edema. Peripheral pulses 2+. +Blunting nails Respiratory: CTAB.No rales, rhonchi, or wheezing. . On RA.  Abdomen: + bowel sounds, normoactive. No distention or tenderness.  GU: Not examined.  Skin: C/D/I. No apparent lesions. MSK:      No apparent deformity.      Strength:                RUE: 5/5 SA, 5/5 EF, 5/5 EE, 5/5 WE, 5/5 FF, 5/5 FA                 LUE: 4/5 SA, 4/5 EF, 4/5 EE, 4/5 WE, 4/5 FF, 4/5 FA                 RLE: 5/5 HF, 5/5 KE, 5/5 DF, 5/5 EHL, 5/5 PF                 LLE:  5-/5 HF, 5-/5 KE, 5/5 DF, 5/5 EHL, 5/5 PF    Neurologic exam:  Cognition: AAO to person, place, time and event.  Language: Fluent, No substitutions or neoglisms. + Severe dysarthria.  Insight: Good insight into current condition.  Mood: Pleasant affect, appropriate mood. CN: + decreased sensation in L V2-3.  Coordination: No apparent tremors. +  mild R ataxia  Psych: verbose     Assessment/Plan: 1. Functional deficits which require 3+ hours per day of interdisciplinary therapy in a comprehensive inpatient rehab setting. Physiatrist is providing close team supervision and 24 hour management of active medical problems listed below. Physiatrist and rehab team continue to assess barriers to discharge/monitor patient progress toward functional and medical goals  Care Tool:  Bathing    Body parts bathed by patient: Right arm, Left arm, Chest, Abdomen, Front perineal area, Buttocks, Right upper leg, Left upper leg, Right lower leg, Left lower leg, Face         Bathing assist Assist Level: Supervision/Verbal cueing     Upper Body Dressing/Undressing Upper body dressing   What is the patient wearing?: Pull over shirt    Upper body assist Assist Level: Set up assist  Lower Body Dressing/Undressing Lower body dressing      What is the patient wearing?: Incontinence brief, Pants     Lower body assist Assist for lower body dressing: Set up assist     Toileting Toileting    Toileting assist Assist for toileting: Independent with assistive device Assistive Device Comment: urinal   Transfers Chair/bed transfer  Transfers assist     Chair/bed transfer assist level: Minimal Assistance - Patient > 75%     Locomotion Ambulation   Ambulation assist      Assist level: Moderate Assistance - Patient 50 - 74% Assistive device: No Device Max distance: 150   Walk 10 feet activity   Assist     Assist level: Moderate Assistance - Patient - 50 - 74% Assistive device: No Device, Hand held assist   Walk 50 feet activity   Assist    Assist level: Moderate Assistance - Patient - 50 - 74% Assistive device: No Device, Hand held assist    Walk 150 feet activity   Assist    Assist level: Moderate Assistance - Patient - 50 - 74% Assistive device: No Device, Hand held assist    Walk 10 feet on uneven  surface  activity   Assist     Assist level: Moderate Assistance - Patient - 50 - 74% Assistive device: Hand held assist   Wheelchair     Assist Is the patient using a wheelchair?: Yes Type of Wheelchair: Manual    Wheelchair assist level: Supervision/Verbal cueing Max wheelchair distance: 150    Wheelchair 50 feet with 2 turns activity    Assist        Assist Level: Supervision/Verbal cueing   Wheelchair 150 feet activity     Assist      Assist Level: Supervision/Verbal cueing   Blood pressure 131/61, pulse 64, temperature 97.7 F (36.5 C), resp. rate 16, SpO2 95 %.   Medical Problem List and Plan: 1. 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  L ICA stenosis: d/c Lovenox since ambulating 650 feet             -antiplatelet therapy: Plavix/ASA X 3 months and is to follow with IR closely as OP for stenting on discharge (L ICA only; R ICA chronic total occlusion not a candidate for revascularization)   3. Pain: Gabapentin 600 mg TID. Add Metanx daily- switch to B vitamin on discharge             --avoid narcotics with polysubstance hx--was not taking hydrocodone, ultram or mobic at home.   4. Mood/Behavior/Sleep: LCSW to follow for evaluation and support.             -antipsychotic agents: N/A   5. Neuropsych/cognition: This patient is capable of making decisions on his own behalf.   6. Skin/Wound Care: Routine pressure relief measures   7. Fluids/Electrolytes/Nutrition: Monitor I/O. Encourage nectar thick fluid intake             --recheck BMET in am.    8. HTN: Monitor BP TID. Continue Norvasc, Hydralazine, Cozaar  06/05/2022    8:01 PM 06/05/2022    7:40 PM 06/05/2022    4:06 PM  Vitals  with BMI  Systolic [phone]  Diastolic 77 85 78  Pulse 76 82 64   - stable on admission   9. L-ICA stenosis: Continue DAPT w/Lipitor. Encourage tobacco cessation --Follow up with IVR or Vascular with [facility] after discharge.  10. CAD with HF/atypical CP, c/b cocaine use: Heart healthy diet. Monitor wts daily             --Continue Hydralazine, Lipitor, Cozaar,              --continue to hold metoprolol (BRADYCARDIA) and Imdur-->avoid hypotension.      11. H/o Asthma: Stable on Incurse  MDI 12. Polysubstance abuse: Continues to use tobacco, ETOH and cocaine --Encourage cessation 13.  PAD s/p Fem-Fem BG:/stent distal aorta: Encourage tobacco cessation.             --on Lipitor and DAPT 14. Anxiety d/o: Monitor closely 15. Persistent hypokalemia/AKI: K+- 2.9 at admission and improved with supplementation; monitor - K 4.0; Cr slight increase 1.18 oi admission labs -> Encourage PO fluids and repeat Monday  16. Hypomagnesemia: Mg-1.6-->Improved with supplement yesterday --will start daily supplement as likely affecting K+ levels. - 11.25: Mag 1.7; stable - monitor 17. Dysphagia: Continue D2, advance to thin liquids. Encourage fluid intake.  18. ETOH withdrawal: Was on Valium X 3 days -->Finished CIWA protocol             --On Thiamine and Folic acid. Replace folic acid with Metanx in hospital but switch back to folic acid upon discharge 19. Hyperglycemia: encouraged avoiding foods with added sugar and bread/pasta/rice 20. Vitamin D insufficiency: start ergocalciferol 50,000U once per week for 7 weeks   LOS: 4 days A FACE TO FACE EVALUATION WAS PERFORMED  Martha Clan P Jagger Beahm 06/09/2022, 12:21 PM

## 2022-06-09 NOTE — Progress Notes (Signed)
Occupational Therapy Session Note  Patient Details  Name: Timothy Galvan MRN: [id number] Date of Birth: 24-Jan-1956  Today's Date: 06/09/2022 OT Individual Time: 1115-1205 OT Individual Time Calculation (min): 50 min    Short Term Goals: Week 1:  OT Short Term Goal 1 (Week 1): STG= LTG due to LOS  Skilled Therapeutic Interventions/Progress Updates:  Pt received for skilled OT session seated in wheelchair. OT session focus on R-hand coordination/strengthening activities and functional mobility. Pt agreeable to interventions, demonstrating overall happy mood. Pt with no reports of pain, stating "I can eat normal food now. . .things are getting better" in regards to his functional progress.   Pt ambulates room<>therapy gym with use of rollator and mod verbal cuing for safe management, simulating household-level functional mobility.   In therapy gym, pt participates in non-standardized peg test as a means of gaining insight into R-hand functioning. Pt completes two trials of test, demonstrating improved performance in second trial due to cuing for pacing and sustained attention.   Pt then engages in push-pin activity using pincer grasp to target strengthening and coordination. Pt performs well, both pushing and pulling pins, demonstrating improved control towards mid-point of activity.    Pt led through series of hand-strengthening exercises with use of pink foam block. Pt completes 1 set/10 reps of exercises below: -Single-hand Grip -Key Pinch -Three-finger Pinch -Pad-to Pad Pinch -Thumb Flex -Finger tip -Grip  Pt requires mod verbal cuing for format, position/activation of all digits, and pacing.    Pt remained seated in [facility] with all immediate needs meet at end of session. Pt continues to be appropriate for skilled OT intervention to promote further functional independence.    Therapy Documentation Precautions:  Precautions Precautions: Fall Precaution Comments: Diet restrictions  - nectar, edentulous Restrictions Weight Bearing Restrictions: No  Therapy/Group: Individual Therapy  Maudie Mercury, OTR/L, MSOT  06/09/2022, 11:08 AM

## 2022-06-09 NOTE — Progress Notes (Signed)
Physical Therapy Session Note  Patient Details  Name: Timothy Galvan MRN: [id number] Date of Birth: 07/28/1955  Today's Date: 06/09/2022 PT Individual Time: 3536-1443 + 1540-0867 + 6195-0932 PT Individual Time Calculation (min): 25 min + 28 min + 72 min  Short Term Goals: Week 1:  PT Short Term Goal 1 (Week 1): = LTGs due to ELOS  Skilled Therapeutic Interventions/Progress Updates:      1st session: Pt sitting EOB to start - NT at bedside for handoff of care. Pt in agreement to therapy session - denies pain. Requests assistance in getting dressed. Completes both UB and LB dressing with setupA. Donned new clean brief with totalA for time management. Able to don shoes with setupA as well. Pt requests a smaller pair of shoes - informed him that he needs his daughter or family member to bring in shoes as we don't carry extras.   Sit<>stand to 4-wheeled walker with supervision. Gait training 140f + 1552fwith 4-wheeled walker and supervision during session. Cues for keeping body in close proximity to walker and reducing heel drag bilaterally.   Nustep activity completed for 7.5 minutes, completing 400 steps total. Starting at L7 resistance but fading to L5 resistance after 2 minutes due to fatigue. Used BLE/BUE to encourage weight bearing and reciprocal stepping.  Concluded session seated in w/c at end of session, EVS in room cleaning. Informed of upcoming therapy schedule. All needs met.    2nd session: Pt sitting in w/c to start. MD in room for morning rounding. Collaborated and discussed DC planning - plan for DC by end of week and reached out to CSW to schedule family training with his daughter.  Sit<>stand to 4-wheeled walker with supervision. Ambulates ~16589fith supervision and rollator to main rehab gym. Able to turn quickly and adjust to instructions without LOB.  Adminstered BERG balance test with results outlined below.  Pt having most difficulty with alternating toe taps,  single leg stance, and reaching outside BOS. Ataxia limiting. Educated patient on fall risk based on scoring and the importance of using the 4-wheeled walker at all times to reduce his risk of falls. Pt voices understanding and in agreement.  Patient demonstrates increased fall risk as noted by score of   40/56 on Berg Balance Scale.  (<36= high risk for falls, close to 100%; 37-45 significant >80%; 46-51 moderate >50%; 52-55 lower >25%)  Ambulated back to his room ~165f35fith supervision and rollator. Concluded session seated in w/c with all needs met.    3rd session: Pt sitting in w/c - agreeable to therapy. No reports of pain. Sit<>stand to rollator with supervision. Ambulates from his room to main rehab gym with rollator and supervision assist, ~165ft6fPracticed floor transfers to simulate in the case of a fall at home. Patient able to go from standing to lying on ground with CGA, safe transition and controlled lowering. Able to complete lying on ground to standing with close SBA for safety - pt demonstrating adequate hip flexibility and strength to complete without physical assist. Completed x2 times.  Gait training with no AD to challenge balance and progress towards LRAD. Pt ambulates variable distances, 100-200ft,43fh CGA and no AD. Increased truncal and BLE ataxia while ambulating without an AD.   Stair training using both 3inch and 6inch steps, 2 hand rails for safety. Pt navigating up/down x12 steps, x2 sets. Progressing from CGA toPatterson Heightspervision assist - demonstrating alternating stepping pattern, ataxia limiting but able to safely navigate with  fair safety awareness. Cues only for slowing down.  Dynamic gait training completed using the agility ladder. No AD to challenge balance. CGA/minA overall required for safety due to ataxia. Reciprocal stepping and side stepping L<>R - completed with vs without UE support. Pt's ataxia limiting coordination and stepping strategies, difficulty  avoiding ladder rungs despite having awareness. Added 3lb ankle weights bilaterally and repeated above tasks - increased difficulty with added weight and regressed from reciprocal stepping to step-to pattern, also required UE support during side stepping task.  Dynamic standing balance training on inverted bosu ball in // bars. MinA for safely stepping onto ball and CGA for standing balance with BUE support - minA needed for standing balance without UE support. Able to progress to CGA without UE support with time and cues for technique. Attempted to complete with eyes closed but unable to safely stand for >5 seconds with eyes closed. Progressed to standing exercises on inverted bosu with CGA for safety and BUE support as well - 1x15 heel raises + 2x15 deep squats.  Pt ambulated back to his room, ~162f, with supervision and rollator, 3# ankle weights bilaterally for strengthening and endurance. Cues to lift feet to reduce heel drag bilaterally. Concluded session with patient sitting in w/c, call bell in reach, and all needs met.    Therapy Documentation Precautions:  Precautions Precautions: Fall Precaution Comments: Diet restrictions - nectar, edentulous Restrictions Weight Bearing Restrictions: No General:    Balance: Balance Balance Assessed: Yes Standardized Balance Assessment Standardized Balance Assessment: Berg Balance Test Berg Balance Test Sit to Stand: Able to stand without using hands and stabilize independently Standing Unsupported: Able to stand safely 2 minutes Sitting with Back Unsupported but Feet Supported on Floor or Stool: Able to sit safely and securely 2 minutes Stand to Sit: Sits safely with minimal use of hands Transfers: Able to transfer safely, definite need of hands Standing Unsupported with Eyes Closed: Able to stand 10 seconds safely Standing Ubsupported with Feet Together: Able to place feet together independently and stand 1 minute safely From Standing,  Reach Forward with Outstretched Arm: Can reach forward >5 cm safely (2") From Standing Position, Pick up Object from Floor: Able to pick up shoe safely and easily From Standing Position, Turn to Look Behind Over each Shoulder: Turn sideways only but maintains balance Turn 360 Degrees: Needs close supervision or verbal cueing Standing Unsupported, Alternately Place Feet on Step/Stool: Able to complete >2 steps/needs minimal assist Standing Unsupported, One Foot in Front: Able to take small step independently and hold 30 seconds Standing on One Leg: Tries to lift leg/unable to hold 3 seconds but remains standing independently Total Score: 40/56    Therapy/Group: Individual Therapy  Myrtis Maille P Allyse Fregeau 06/09/2022, 10:06 AM

## 2022-06-09 NOTE — Plan of Care (Addendum)
Downgraded due to slower than anticipated progress and short ELOS  Problem: RH Expression Communication Goal: LTG Patient will increase speech intelligibility (SLP) Description: LTG: Patient will increase speech intelligibility at word/phrase/conversation level with cues, % of the time (SLP) Flowsheets (Taken 06/09/2022 2030) LTG: Patient will increase speech intelligibility (SLP): Minimal Assistance - Patient > 75% - Moderate Assistance - Patient 50-74% Level: Conversation level Percent of time patient will use intelligible speech: 80

## 2022-06-10 LAB — VITAMIN E
Vitamin E (Alpha Tocopherol): 11.8 mg/L (ref 9.0–29.0)
Vitamin E(Gamma Tocopherol): 1.7 mg/L (ref 0.5–4.9)

## 2022-06-10 MED ORDER — MAGNESIUM GLUCONATE 500 MG PO TABS
250.0000 mg | ORAL_TABLET | Freq: Every day | ORAL | Status: DC
  Administered 2022-06-10: 250 mg via ORAL
  Filled 2022-06-10: qty 1

## 2022-06-10 NOTE — Progress Notes (Signed)
Physical Therapy Session Note  Patient Details  Name: Timothy Galvan MRN: [id number] Date of Birth: Nov 19, 1955  Today's Date: 06/10/2022 PT Individual Time: 1415-1525 PT Individual Time Calculation (min): 70 min   Short Term Goals: Week 1:  PT Short Term Goal 1 (Week 1): = LTGs due to ELOS  Skilled Therapeutic Interventions/Progress Updates:      Pt sitting in w/c to start - in agreement to therapy session. Pt denies pain. Pt requests to use restroom prior to leaving his room.   Sit<>stand to rollator with supervision. Ambulates with supervision and rollator to bathroom and pt continent of bladder while standing. Min cues for safety awareness and for pacing activity and to avoid rushing. RN entering room to administer Rx - patient did this while seated on rollator - demonstrating awareness to lock brakes prior to sitting.   Ambulated ~153f with supervision and rollator to main rehab gym. Focused on higher level dynamic standing balance in // bars - CGA for all activities for safety. Standing on large wooden rocker board (rocker A/P direction) with feet apart, feet together, and feed semi-tandem, eyes open. Progressed to random perturbations on board to challenge balance with patient in semi-tandem stance. Completed same sequence of challenge with rocker board in lateral direction with similar performance. Continued dynamic standing balance training using blue air-ex balance beam - forward/backward tandem walking and lateral stepping L<>R with CGA for balance with patient not using UE support - ataxia noted while side stepping that limited him and had great difficulty with tandem backward walking.   Discussed changing life habits to promote healing and reduce risk factors for having another CVA - smoking cessation, surrounding himself with good support, removing drugs/alcohol from social groups, etc. Pt in agreement.   Ambulated back to his room ~1554fwith supervision assist and rollator -  pt concluded session seated EOB with alarm on, call bell in reach and all needs met.    Therapy Documentation Precautions:  Precautions Precautions: Fall Precaution Comments: Diet restrictions - nectar, edentulous Restrictions Weight Bearing Restrictions: No General:      Therapy/Group: Individual Therapy  [name]1/29/2023, 7:50 AM

## 2022-06-10 NOTE — Consult Note (Signed)
Neuropsychological Consultation   Patient:   Timothy Galvan   DOB:   April 09, 1956  MR Number:  [id number]  Location:  [facility] [id number] [place] [address] Dept: [place]: [phone]           Date of Service:   06/10/2002  Start Time:   1 PM End Time:   3 PM  Provider/Observer:  Ilean Skill, Psy.D.       Clinical Neuropsychologist       Billing Code/Service: 56389  Chief Complaint:    Timothy Galvan is a 66 year old male referred for neuropsychological consultation due to coping and adjustment following subcortical stroke.  Patient has a past medical history including CAD, colon cancer, PAD, polysubstance abuse including cocaine and ethanol/tobacco.  Patient had a recent TIA in 8/23.  Patient admitted to [facility] on 06/01/2022 with 3-day history of dizziness, balance problems and difficulty walking, wheezing and stuttering.  Urine drug screen was positive for cocaine.  Patient admits to use of cocaine immediately prior to his stroke event or development of symptoms consistent with stroke event.  MRI/MRI brain done revealing acute to subacute infarcts in left basal ganglia and right pons without hemorrhage as well as unchanged occlusion of right ICA and unchanged severe stenosis of left ICA with moderate to severe stenosis of distal right M1 segment etc.  Patient with ongoing weakness and balance deficits as well as expressive language deficits with significant dysarthria.  Reason for Service:  Patient referred for neuropsychological consultation due to coping and adjustment following recent subcortical stroke with a history of cocaine and alcohol/tobacco abuse.  Below is the HPI for the current admission.  HPI: Timothy Galvan is a 66 year old male with history of CAD, colon CA, PAD s/p FFBG, polysubstance abuse (cocaine, ETOH, tobacco), recent TIA  02/2022;  who was admitted on 06/01/22 to [facility] with 3 day history of dizziness, balance problems with difficulty walking, wheezing and stuttering. UDS positive for cocaine. CTA head/neck was negative for LVO. MRI/MRA brain done revealing acute to subacute infarcts in left basal ganglia and right pons without hemorrhage, unchanged occlusion of R-ICA and unchanged severe stenosis L-ICA with focal moderate to severe stenosis of distal right M1 segment and short segment severe stenosis of L P2/P3 segment. 2D echo done revealing mild decrease in LVF 45-50% with mild decrease in function and mild concentric LVH.  Stroke felt to be embolic and 30 day event monitor to be mailed to his home.    Dr. Hortense Ramal recommended DAPT X 3 months followed by ASA alone as well as following up with Dr. Dr. Eben Burow for input on stenting of L-ICA after discharge. BP has been labile and Losartan and Amlodipine added. Valium X 3 days due to ETOH abuse and CIWA protocol. He has had issues with wheezing on and off lately per reports and diet downgraded to D3, nectar liquids due to issues with coughing as well as recent teeth extractions w/sore mouth.  Electrolyte abnormalities supplemented. Patient noted to have dysarthria with low voice, expressive deficits, cognitive deficits with SLUMS 14/23 (has 7th grade education), weakness with balance deficits  Current Status:  Patient was awake and alert sitting in his wheelchair.  He did have difficulty navigating the wheelchair around the room getting tangled with the power cord to his reminder belt.  Patient with significant dysarthria and articulation deficits although without prior direct contact  with the patient it is hard to determine how much of his expressive language were premorbid in nature.  Patient reports that he never learned to read or write.  He is fairly nave about his overall medical status.  We addressed issues related to his cocaine use and admits to a living situation  where lots of people are coming by at various times and that people will come to his place where he lives with the intent and purposes of doing various drugs.  Patient is trying to avoid being around people that would encourage his own drug use reports that he is not a consistent cocaine user but admits to infrequent use when available to him.  Patient was oriented to person place time and situation although rather nave regarding medical status.  Behavioral Observation: Timothy Galvan  presents as a 66 y.o.-year-old Right handed African American Male who appeared his stated age. his dress was Appropriate and he was Fairly Groomed and Well Groomed and his manners were Appropriate to the situation.  his participation was indicative of Redirectable behaviors.  There were physical disabilities noted.  he displayed an appropriate level of cooperation and motivation.     Interactions:    Active Inattentive and Redirectable  Attention:   abnormal and attention span appeared shorter than expected for age  Memory:   within normal limits; recent and remote memory intact  Visuo-spatial:  not examined  Speech (Volume):  normal  Speech:   Garbled; slurred  Thought Process:  Tangential  Though Content:  WNL; not suicidal and not homicidal  Orientation:   person, place, and situation  Judgment:   Poor  Planning:   Poor  Affect:    Appropriate  Mood:    Euthymic  Insight:   Fair  Intelligence:   low  Substance Use:  There is a documented history of alcohol, cocaine, and tobacco abuse confirmed by the patient.    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 ([place]) 06/05/2022   Stroke (cerebrum) ([place]) 06/01/2022   PAD (peripheral artery disease) ([place]) 06/01/2022   Colon cancer ([place]) 06/01/2022   COPD (chronic obstructive pulmonary disease) ([place]) 06/01/2022   Blood clot in vein 03/08/2022   Cancer ([place]) 03/08/2022   Chest pain 01/16/2019   GERD (gastroesophageal reflux disease) 01/10/2018   CAD (coronary artery disease) 01/10/2018   Closed compression fracture of body of L1 vertebra ([place]) 01/10/2018   HLD (hyperlipidemia) 01/10/2018    Psychiatric History:  No prior psychiatric history the patient likely has learning disabilities and low IQ variables as well as history of significant substance abuse including alcohol and cocaine.  Family Med/Psych History: History reviewed. No pertinent family history.  Risk of Suicide/Violence: virtually non-existent   Impression/DX:  RAYQUON USELMAN is a 66 year old male referred for neuropsychological consultation due to coping and adjustment following subcortical stroke.  Patient has a past medical history including CAD, colon cancer, PAD, polysubstance abuse including cocaine and ethanol/tobacco.  Patient  had a recent TIA in 8/23.  Patient admitted to [facility] on 06/01/2022 with 3-day history of dizziness, balance problems and difficulty walking, wheezing and stuttering.  Urine drug screen was positive for cocaine.  Patient admits to use of cocaine immediately prior to his stroke event or development of symptoms consistent with stroke event.  MRI/MRI brain done revealing acute to subacute infarcts in left basal ganglia and right pons without hemorrhage as well as unchanged occlusion of right ICA and unchanged severe stenosis of left ICA with moderate to severe stenosis of distal right M1 segment etc.  Patient with ongoing weakness and balance deficits as well as expressive language deficits with significant  dysarthria.  Patient was awake and alert sitting in his wheelchair.  He did have difficulty navigating the wheelchair around the room getting tangled with the power cord to his reminder belt.  Patient with significant dysarthria and articulation deficits although without prior direct contact with the patient it is hard to determine how much of his expressive language were premorbid in nature.  Patient reports that he never learned to read or write.  He is fairly nave about his overall medical status.  We addressed issues related to his cocaine use and admits to a living situation where lots of people are coming by at various times and that people will come to his place where he lives with the intent and purposes of doing various drugs.  Patient is trying to avoid being around people that would encourage his own drug use reports that he is not a consistent cocaine user but admits to infrequent use when available to him.  Patient was oriented to person place time and situation although rather nave regarding medical status.    Diagnosis:    Basal ganglia and pons stroke        Electronically Signed   _______________________ Ilean Skill, Psy.D. Clinical Neuropsychologist

## 2022-06-10 NOTE — Progress Notes (Signed)
Patient ID: Timothy Galvan, male   DOB: Nov 20, 1955, 66 y.o.   MRN: [id number]  Met with pt and spoke with daughter regarding team conference mod/I level goals and discharge 12/1. Daughter is coming tomorrow to go through education and see how Dad does and do hands on training in preparation for discharge Friday. See tomorrow when here

## 2022-06-10 NOTE — Progress Notes (Signed)
Occupational Therapy Session Note  Patient Details  Name: Timothy Galvan MRN: [id number] Date of Birth: 07/28/1955  Today's Date: 06/10/2022 OT Individual Time: 0935-1030 and 1130-1200 OT Individual Time Calculation (min): 55 min  and 30 min   Short Term Goals: Week 1:  OT Short Term Goal 1 (Week 1): STG= LTG due to LOS  Skilled Therapeutic Interventions/Progress Updates:   1st Session:  Patient received seated in wheelchair.  Patient declined shower - already dressed for the day.  Ambulated to bathroom with rollator and supervision - cueing to manage rollator brakes.  Patient continent - removed brief and did quick "wash up."  Patient able to walk to shower room.  Reviewed process for transfer to tub via tub transfer bench.  Patient planning to pull his bench from his home and bring it to his daughter's home.  Practiced stepping over tub to assess his ability to transfer without bench.  Walked to [employer] to practice child's pose - to stretch anterior shin.  Ambulated back to room with supervision with only one rest break, seated on rollator.    2nd session:  Simulated long walk (patient walks to grocery store) but has not been able to do so for a year due to left leg weakness/tightness.  Patient questioning his ability to walk distance.  Offered to bring wheelchair in the event he was unable to walk back to room.  Walked to [employer] over 1000 feet with two seated rest breaks.  Reviewed importance of maintaining health, not drinking/smoking/or doing drugs.  Patient agreed.  Patient left in room in wheelchair - safety belt removed from chair.  Call bell in reach.    Therapy Documentation Precautions:  Precautions Precautions: Fall Precaution Comments: Diet restrictions - nectar, edentulous Restrictions Weight Bearing Restrictions: No  Pain: Pain Assessment Pain Score: 0   Therapy/Group: Individual Therapy  Mariah Milling 06/10/2022, 12:29 PM

## 2022-06-10 NOTE — Progress Notes (Addendum)
Speech Language Pathology Daily Session Note  Patient Details  Name: Timothy Galvan MRN: [id number] Date of Birth: July 03, 1956  Today's Date: 06/10/2022 SLP Individual Time: 0730-0820 SLP Individual Time Calculation (min): 50 min  Short Term Goals: Week 1: SLP Short Term Goal 1 (Week 1): STG = LTG d/t ELOS  Skilled Therapeutic Interventions: S: Pt seen this date for skilled ST intervention targeting swallowing, speech intelligibility, and cognitive goals outlined in care plan. Pt received awake/alert and OOB in w/c; talking to a friend on his cell phone and in good spirits. Denies pain. Agreeable to ST intervention in speech office. Pleasant and participatory throughout. Ambulated to speech office with rollator and supervision.  O: Today's session with emphasis on swallowing, speech intelligibility, and basic cognitive skills. SLP provided therapeutic trials of regular textures and thin liquids via cup + straw, which pt self-administered, with no clinical s/sx concerning for airway compromise. Oral phase appeared functional and timely for intake of regular textures with complete oral transit and no significant residuals/oral stasis post-swallow. Per chart review, VSS and WBC WNL.  Implemented safe swallowing strategies with initial Sup A, faded to Mod I. Functional recall of events from yesterday at the Mod I level. Min-Mod A for implementation of speech intelligibility to include use of pacing board to slow speaking rate, over-articulation, and increasing vocal intensity. Most beneficial technique appeared to be use of pacing board and over-articulation, which subjectively improved speech intelligibility from 70-75% to 85-90% at the short phrase and sentence level within the context of structured task. Not yet generalizing skill to spontaneous conversational tasks, and requires visual cues from therapist to utilize pacing board. Finally, pt participated in verbal problem-solving task by identifying  dangers in ADL pictures with Sup A to Mod I.  A: Pt appears sitmulable for skilled ST intervention as evident by improvement in tolerance of thin liquids via cup and straw and improved speech intelligibility with use of pacing board, increased vocal intensity, and over-articulation. Recommend initiation of regular diet textures and thin liquids. Family education scheduled for tomorrow (11/30) to prepare for d/c this Friday (12/1), in which supervision will be recommended + HH ST.  P: Pt returned to room and left in w/c with all safety measures activated. Call bell reviewed and within reach and all immediate needs met. Continue per current ST POC next session.   Pain None/Denied; NAD  Therapy/Group: Individual Therapy  Demiana Crumbley A Analysse Quinonez 06/10/2022, 11:46 AM

## 2022-06-10 NOTE — Progress Notes (Signed)
PROGRESS NOTE   Subjective/Complaints: No new complaints this morning Excited to go home on Friday Discussed daughter coming in for caregiver training tomorrow   ROS: Denies fevers, chills, N/V, abdominal pain, constipation, diarrhea, SOB, cough, chest pain, new weakness or paraesthesias.    Objective:   No results found. Recent Labs    06/08/22 0513  WBC 6.4  HGB 13.6  HCT 41.5  PLT 250   Recent Labs    06/08/22 0513  NA 140  K 3.9  CL 107  CO2 24  GLUCOSE 105*  BUN 13  CREATININE 1.04  CALCIUM 9.5    Intake/Output Summary (Last 24 hours) at 06/10/2022 1208 Last data filed at 06/10/2022 1791 Gross per 24 hour  Intake 536 ml  Output 1926 ml  Net -1390 ml        Physical Exam: Vital Signs Blood pressure (!) 125/97, pulse 61, temperature 97.9 F (36.6 C), resp. rate 18, SpO2 95 %. Constitutional: No apparent distress. Appropriate appearance for age.  HENT: + mild JVD. Neck Supple. Trachea midline. Atraumatic, normocephalic. +Edentulous maxilla Eyes: PERRLA. EOMI Cardiovascular: Bradycardia, no murmurs/rub/gallops. No Edema. Peripheral pulses 2+. +Blunting nails Respiratory: CTAB.No rales, rhonchi, or wheezing. . On RA.  Abdomen: + bowel sounds, normoactive. No distention or tenderness.  GU: Not examined.  Skin: C/D/I. No apparent lesions. MSK:      No apparent deformity.      Strength:                RUE: 5/5 SA, 5/5 EF, 5/5 EE, 5/5 WE, 5/5 FF, 5/5 FA                 LUE: 4/5 SA, 4/5 EF, 4/5 EE, 4/5 WE, 4/5 FF, 4/5 FA                 RLE: 5/5 HF, 5/5 KE, 5/5 DF, 5/5 EHL, 5/5 PF                 LLE:  5-/5 HF, 5-/5 KE, 5/5 DF, 5/5 EHL, 5/5 PF    Neurologic exam:  Cognition: AAO to person, place, time and event.  Language: Fluent, No substitutions or neoglisms. + Severe dysarthria w/ 80% intelligibility Insight: Good insight into current condition.  Mood: Pleasant affect, appropriate mood. CN: +  decreased sensation in L V2-3.  Coordination: No apparent tremors. + mild R ataxia  Psych: verbose     Assessment/Plan: 1. Functional deficits which require 3+ hours per day of interdisciplinary therapy in a comprehensive inpatient rehab setting. Physiatrist is providing close team supervision and 24 hour management of active medical problems listed below. Physiatrist and rehab team continue to assess barriers to discharge/monitor patient progress toward functional and medical goals  Care Tool:  Bathing    Body parts bathed by patient: Right arm, Left arm, Chest, Abdomen, Front perineal area, Buttocks, Right upper leg, Left upper leg, Right lower leg, Left lower leg, Face         Bathing assist Assist Level: Supervision/Verbal cueing     Upper Body Dressing/Undressing Upper body dressing   What is the patient wearing?: Pull over shirt    Upper  body assist Assist Level: Set up assist    Lower Body Dressing/Undressing Lower body dressing      What is the patient wearing?: Incontinence brief, Pants     Lower body assist Assist for lower body dressing: Set up assist     Toileting Toileting    Toileting assist Assist for toileting: Independent with assistive device Assistive Device Comment: urinal   Transfers Chair/bed transfer  Transfers assist     Chair/bed transfer assist level: Supervision/Verbal cueing     Locomotion Ambulation   Ambulation assist      Assist level: Supervision/Verbal cueing Assistive device: Rollator Max distance: 150   Walk 10 feet activity   Assist     Assist level: Supervision/Verbal cueing Assistive device: Rollator   Walk 50 feet activity   Assist    Assist level: Supervision/Verbal cueing Assistive device: Rollator    Walk 150 feet activity   Assist    Assist level: Supervision/Verbal cueing Assistive device: Rollator    Walk 10 feet on uneven surface  activity   Assist     Assist level:  Supervision/Verbal cueing Assistive device: Rollator   Wheelchair     Assist Is the patient using a wheelchair?: No Type of Wheelchair: Manual    Wheelchair assist level: Supervision/Verbal cueing Max wheelchair distance: 150    Wheelchair 50 feet with 2 turns activity    Assist        Assist Level: Supervision/Verbal cueing   Wheelchair 150 feet activity     Assist      Assist Level: Supervision/Verbal cueing   Blood pressure (!) 125/97, pulse 61, temperature 97.9 F (36.6 C), resp. rate 18, SpO2 95 %.   Medical Problem List and Plan: 1. Functional deficits secondary to bilateral strokes of the L basal ganglia and R pons, likely d/t bilateral ICA thrombosis             -patient may shower             -ELOS/Goals: 7-10 days - Mod I to SPV with PT, OT, and SLP             -discussed plan to d/c home with daughter in [place]  Discussed plan for d/c Friday 2.  L ICA stenosis: d/c Lovenox since ambulating 650 feet             -antiplatelet therapy: Plavix/ASA X 3 months and is to follow with IR closely as OP for stenting on discharge (L ICA only; R ICA chronic total occlusion not a candidate for revascularization)   3. Pain: Gabapentin 600 mg TID- discuss weaning since pain appears well controlled. Add Metanx daily- switch to B vitamin on discharge             --avoid narcotics with polysubstance hx--was not taking hydrocodone, ultram or mobic at home.   4. Mood/Behavior/Sleep: LCSW to follow for evaluation and support.             -antipsychotic agents: N/A   5. Neuropsych/cognition: This patient is capable of making decisions on his own behalf.   6. Skin/Wound Care: Routine pressure relief measures   7. Fluids/Electrolytes/Nutrition: Monitor I/O. Encourage nectar thick fluid intake             --recheck BMET in am.    8. HTN: Monitor BP TID. Continue Norvasc, Hydralazine, Cozaar. Add magnesium gluconate '205mg'$  HS     06/05/2022    8:01 PM 06/05/2022     7:40 PM 06/05/2022  4:06 PM  Vitals with BMI  Systolic 426 834 196  Diastolic 77 85 78  Pulse 76 82 64   - stable on admission   9. L-ICA stenosis: Continue DAPT w/Lipitor. Encourage tobacco cessation --Follow up with IVR or Vascular with [facility] after discharge.  10. CAD with HF/atypical CP, c/b cocaine use: Heart healthy diet. Monitor wts daily             --continue Hydralazine, Lipitor, Cozaar,              --continue to hold metoprolol (BRADYCARDIA) and Imdur-->avoid hypotension.      11. H/o Asthma: Stable on Incurse  MDI 12. Polysubstance abuse: Continues to use tobacco, ETOH and cocaine --Encourage cessation 13.  PAD s/p Fem-Fem BG:/stent distal aorta: Encourage tobacco cessation.             --on Lipitor and DAPT 14. Anxiety d/o: Monitor closely 15. Persistent hypokalemia/AKI: K+- 2.9 at admission and improved with supplementation; monitor - K 4.0; Cr slight increase 1.18 oi admission labs -> Encourage PO fluids and repeat Monday  16. Hypomagnesemia: Mg-1.6-->Improved with supplement yesterday --will start daily supplement as likely affecting K+ levels. - 11.25: Mag 1.7; stable - monitor 17. Dysphagia: Continue D2, advance to thin liquids. Encourage fluid intake.  18. ETOH withdrawal: Was on Valium X 3 days -->Finished CIWA protocol             --On Thiamine and Folic acid. Replace folic acid with Metanx in hospital but switch back to folic acid upon discharge 19. Hyperglycemia: encouraged avoiding foods with added sugar and bread/pasta/rice 20. Vitamin D insufficiency: start ergocalciferol 50,000U once per week for 7 weeks   LOS: 5 days A FACE TO FACE EVALUATION WAS PERFORMED  Martha Clan P Tayshon Winker 06/10/2022, 12:08 PM

## 2022-06-10 NOTE — Patient Care Conference (Signed)
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) Active Problems:   Left pontine stroke [facility])    Expected Discharge Date: Expected Discharge Date: 06/12/22  Team Members Present: Physician leading conference: Dr. Leeroy Cha Social Worker Present: Ovidio Kin, LCSW Nurse Present: Dorien Chihuahua, RN PT Present: Ginnie Smart, PT OT Present: Other (comment) Antony Salmon, OT) SLP Present: Helaine Chess, SLP PPS Coordinator present : Gunnar Fusi, SLP     Current Status/Progress Goal Weekly Team Focus  Bowel/Bladder   Cont of B/B   Remain cont of B/B   Assist with [place] needs.    Swallow/Nutrition/ Hydration   Regular diet and thin liquids - Sup A   Functional change in oropharyngeal functioning  GOAL MET; family education    ADL's   min assist BADL on evaluation.  Currently close supervision / verbal cueing, occasional min assist   Mod I   Family education, work toward [employer] I    Mobility   supervision overall using 4-wheeled walker as his AD. Ambulating >271f and safely navigating stairs with supervision. Ataxia and generalized deconditioning most limiting. Anticipate he will reach goal level by DC   mod I  Dynamic standing balance, gait training, DC planning    Communication   Min-Mod A to utilize speech intelligibility strategies successfully   Min-Mod A - goal downgraded   GOAL MET; family education     Safety/Cognition/ Behavioral Observations  Sup A   Sup A   GOALS MET; family education    Pain   Denies Pain   Remain pain free   Assess pain level Q4 and prn    Skin   Skin Intact   Prevent any new skin breakdown  Assess QShift      Discharge Planning:  Going home with daughter who will be working from home to provide 24/7 supervision to Dad. 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  Patient on target to meet rehab goals: yes, currently needs supervision overall; mod I in room on 06/11/22. Needs min - mod assist for SLP intelligibility.  *See Care Plan and progress notes for long and short-term goals.   Revisions to Treatment Plan:  N/a   Teaching Needs: Safety, medications, dietary modifications, transfers, toileting, etc  Current Barriers to Discharge: Decreased caregiver support; used a rollator PTA  Possible Resolutions to Barriers: Family education HH follow up services     Medical Summary Current Status: CVA, HTN, neuropathic pain  Barriers to Discharge: Medical stability  Barriers to Discharge Comments: CVA, HTN, neuropathic pain Possible Resolutions to [employer]: continue amlodipine, aspirin, atorvastatin, Plavix, gabapentin   Continued Need for Acute Rehabilitation Level of Care: The patient requires daily medical management by a physician with specialized training in physical medicine and rehabilitation for the following reasons: Direction of a multidisciplinary physical rehabilitation program to maximize  functional independence : Yes Medical management of patient stability for increased activity during participation in an intensive rehabilitation regime.: Yes Analysis of laboratory values and/or radiology reports with any subsequent need for medication adjustment and/or medical intervention. : Yes   I  attest that I was present, lead the team conference, and concur with the assessment and plan of the team.   Dorien Chihuahua B 06/10/2022, 2:40 PM

## 2022-06-11 ENCOUNTER — Other Ambulatory Visit (HOSPITAL_COMMUNITY): Payer: Self-pay

## 2022-06-11 MED ORDER — ASPIRIN 81 MG PO TBEC
81.0000 mg | DELAYED_RELEASE_TABLET | Freq: Every day | ORAL | 0 refills | Status: DC
  Filled 2022-06-11: qty 30, 30d supply, fill #0

## 2022-06-11 MED ORDER — HYDRALAZINE HCL 25 MG PO TABS
25.0000 mg | ORAL_TABLET | Freq: Three times a day (TID) | ORAL | 0 refills | Status: DC
  Filled 2022-06-11: qty 90, 30d supply, fill #0

## 2022-06-11 MED ORDER — PANTOPRAZOLE SODIUM 40 MG PO TBEC
40.0000 mg | DELAYED_RELEASE_TABLET | Freq: Every day | ORAL | 0 refills | Status: DC
  Filled 2022-06-11: qty 30, 30d supply, fill #0

## 2022-06-11 MED ORDER — CLOPIDOGREL BISULFATE 75 MG PO TABS
75.0000 mg | ORAL_TABLET | Freq: Every day | ORAL | 0 refills | Status: DC
  Filled 2022-06-11: qty 30, 30d supply, fill #0

## 2022-06-11 MED ORDER — AMLODIPINE BESYLATE 2.5 MG PO TABS
2.5000 mg | ORAL_TABLET | Freq: Every day | ORAL | 0 refills | Status: DC
  Filled 2022-06-11: qty 30, 30d supply, fill #0

## 2022-06-11 MED ORDER — THIAMINE HCL 100 MG PO TABS
100.0000 mg | ORAL_TABLET | Freq: Every day | ORAL | 0 refills | Status: DC
  Filled 2022-06-11: qty 30, 30d supply, fill #0

## 2022-06-11 MED ORDER — MELATONIN 3 MG PO TABS
3.0000 mg | ORAL_TABLET | Freq: Every day | ORAL | 0 refills | Status: DC
  Filled 2022-06-11: qty 30, 30d supply, fill #0

## 2022-06-11 MED ORDER — L-METHYLFOLATE-B6-B12 3-35-2 MG PO TABS
1.0000 | ORAL_TABLET | Freq: Two times a day (BID) | ORAL | 0 refills | Status: DC
  Filled 2022-06-11: qty 60, 30d supply, fill #0

## 2022-06-11 MED ORDER — ADULT MULTIVITAMIN W/MINERALS CH
1.0000 | ORAL_TABLET | Freq: Every day | ORAL | 0 refills | Status: DC
  Filled 2022-06-11: qty 30, 30d supply, fill #0

## 2022-06-11 MED ORDER — ATORVASTATIN CALCIUM 40 MG PO TABS
40.0000 mg | ORAL_TABLET | Freq: Every day | ORAL | 0 refills | Status: DC
  Filled 2022-06-11: qty 40, 40d supply, fill #0

## 2022-06-11 MED ORDER — VITAMIN D (ERGOCALCIFEROL) 1.25 MG (50000 UNIT) PO CAPS
50000.0000 [IU] | ORAL_CAPSULE | ORAL | 0 refills | Status: DC
  Filled 2022-06-11: qty 5, 35d supply, fill #0

## 2022-06-11 MED ORDER — GABAPENTIN 300 MG PO CAPS
600.0000 mg | ORAL_CAPSULE | Freq: Three times a day (TID) | ORAL | 0 refills | Status: DC
  Filled 2022-06-11: qty 180, 30d supply, fill #0

## 2022-06-11 MED ORDER — NICOTINE 21 MG/24HR TD PT24
21.0000 mg | MEDICATED_PATCH | Freq: Every day | TRANSDERMAL | 0 refills | Status: DC
  Filled 2022-06-11: qty 28, 28d supply, fill #0

## 2022-06-11 MED ORDER — LOSARTAN POTASSIUM 50 MG PO TABS
50.0000 mg | ORAL_TABLET | Freq: Every day | ORAL | 0 refills | Status: DC
  Filled 2022-06-11: qty 30, 30d supply, fill #0

## 2022-06-11 MED ORDER — AMLODIPINE BESYLATE 2.5 MG PO TABS
2.5000 mg | ORAL_TABLET | Freq: Every day | ORAL | Status: DC
  Administered 2022-06-12: 2.5 mg via ORAL
  Filled 2022-06-11: qty 1

## 2022-06-11 MED ORDER — UMECLIDINIUM BROMIDE 62.5 MCG/ACT IN AEPB
1.0000 | INHALATION_SPRAY | Freq: Every day | RESPIRATORY_TRACT | 0 refills | Status: DC
  Filled 2022-06-11: qty 30, 30d supply, fill #0

## 2022-06-11 MED ORDER — MAGNESIUM GLUCONATE 500 MG PO TABS
500.0000 mg | ORAL_TABLET | Freq: Every day | ORAL | Status: DC
  Administered 2022-06-11: 500 mg via ORAL
  Filled 2022-06-11: qty 1

## 2022-06-11 MED ORDER — MAGNESIUM OXIDE 400 MG PO TABS
400.0000 mg | ORAL_TABLET | Freq: Every day | ORAL | 0 refills | Status: DC
  Filled 2022-06-11: qty 30, 30d supply, fill #0

## 2022-06-11 MED ORDER — L-METHYLFOLATE-B6-B12 3-35-2 MG PO TABS
1.0000 | ORAL_TABLET | Freq: Two times a day (BID) | ORAL | Status: DC
  Administered 2022-06-11 – 2022-06-12 (×2): 1 via ORAL
  Filled 2022-06-11 (×2): qty 1

## 2022-06-11 NOTE — Progress Notes (Signed)
PROGRESS NOTE   Subjective/Complaints: He is feeling well this morning He has no new complaints Discussed team meeting yesterday He is excited to go home tomorrow   ROS: Denies fevers, chills, N/V, abdominal pain, constipation, diarrhea, SOB, cough, chest pain, new weakness or paraesthesias.    Objective:   No results found. No results for input(s): "WBC", "HGB", "HCT", "PLT" in the last 72 hours.  No results for input(s): "NA", "K", "CL", "CO2", "GLUCOSE", "BUN", "CREATININE", "CALCIUM" in the last 72 hours.   Intake/Output Summary (Last 24 hours) at 06/11/2022 1027 Last data filed at 06/11/2022 0843 Gross per 24 hour  Intake 1071 ml  Output 3000 ml  Net -1929 ml        Physical Exam: Vital Signs Blood pressure (!) 136/57, pulse 68, temperature 97.6 F (36.4 C), temperature source Oral, resp. rate 18, SpO2 94 %. Constitutional: No apparent distress. Appropriate appearance for age.  HENT: + mild JVD. Neck Supple. Trachea midline. Atraumatic, normocephalic. +Edentulous maxilla Eyes: PERRLA. EOMI Cardiovascular: Bradycardia, no murmurs/rub/gallops. No Edema. Peripheral pulses 2+. +Blunting nails Respiratory: CTAB.No rales, rhonchi, or wheezing. . On RA.  Abdomen: + bowel sounds, normoactive. No distention or tenderness.  GU: Not examined.  Skin: C/D/I. No apparent lesions. MSK:      No apparent deformity.      Strength:                RUE: 5/5 SA, 5/5 EF, 5/5 EE, 5/5 WE, 5/5 FF, 5/5 FA                 LUE: 4/5 SA, 4/5 EF, 4/5 EE, 4/5 WE, 4/5 FF, 4/5 FA                 RLE: 5/5 HF, 5/5 KE, 5/5 DF, 5/5 EHL, 5/5 PF                 LLE:  5-/5 HF, 5-/5 KE, 5/5 DF, 5/5 EHL, 5/5 PF    Neurologic exam:  Cognition: AAO to person, place, time and event.  Language: Fluent, No substitutions or neoglisms. + Severe dysarthria w/ 80% intelligibility Insight: Good insight into current condition.  Mood: Pleasant affect,  appropriate mood. CN: + decreased sensation in L V2-3.  Coordination: No apparent tremors. + mild R ataxia  Ambulating S with rollator Psych: verbose     Assessment/Plan: 1. Functional deficits which require 3+ hours per day of interdisciplinary therapy in a comprehensive inpatient rehab setting. Physiatrist is providing close team supervision and 24 hour management of active medical problems listed below. Physiatrist and rehab team continue to assess barriers to discharge/monitor patient progress toward functional and medical goals  Care Tool:  Bathing    Body parts bathed by patient: Right arm, Left arm, Chest, Abdomen, Front perineal area, Buttocks, Right upper leg, Left upper leg, Right lower leg, Left lower leg, Face         Bathing assist Assist Level: Supervision/Verbal cueing     Upper Body Dressing/Undressing Upper body dressing   What is the patient wearing?: Pull over shirt    Upper body assist Assist Level: Set up assist    Lower Body  Dressing/Undressing Lower body dressing      What is the patient wearing?: Incontinence brief, Pants     Lower body assist Assist for lower body dressing: Set up assist     Toileting Toileting    Toileting assist Assist for toileting: Independent with assistive device Assistive Device Comment: urinal   Transfers Chair/bed transfer  Transfers assist     Chair/bed transfer assist level: Supervision/Verbal cueing     Locomotion Ambulation   Ambulation assist      Assist level: Supervision/Verbal cueing Assistive device: Rollator Max distance: 150   Walk 10 feet activity   Assist     Assist level: Supervision/Verbal cueing Assistive device: Rollator   Walk 50 feet activity   Assist    Assist level: Supervision/Verbal cueing Assistive device: Rollator    Walk 150 feet activity   Assist    Assist level: Supervision/Verbal cueing Assistive device: Rollator    Walk 10 feet on uneven  surface  activity   Assist     Assist level: Supervision/Verbal cueing Assistive device: Rollator   Wheelchair     Assist Is the patient using a wheelchair?: No Type of Wheelchair: Manual    Wheelchair assist level: Supervision/Verbal cueing Max wheelchair distance: 150    Wheelchair 50 feet with 2 turns activity    Assist        Assist Level: Supervision/Verbal cueing   Wheelchair 150 feet activity     Assist      Assist Level: Supervision/Verbal cueing   Blood pressure (!) 136/57, pulse 68, temperature 97.6 F (36.4 C), temperature source Oral, resp. rate 18, SpO2 94 %.   Medical Problem List and Plan: 1. Functional deficits secondary to bilateral strokes of the L basal ganglia and R pons, likely d/t bilateral ICA thrombosis             -patient may shower             -ELOS/Goals: 7-10 days - Mod I to SPV with PT, OT, and SLP             -discussed plan to d/c home with daughter in [place] for d/c Friday  Family caregiver training today.  2.  L ICA stenosis: d/c Lovenox since ambulating 650 feet             -antiplatelet therapy: Plavix/ASA X 3 months and is to follow with IR closely as OP for stenting on discharge (L ICA only; R ICA chronic total occlusion not a candidate for revascularization)   3. Pain: Gabapentin 600 mg TID- discuss weaning since pain appears well controlled. Add Metanx daily, increase to BID, switch to B vitamin on discharge             --avoid narcotics with polysubstance hx--was not taking hydrocodone, ultram or mobic at home.   4. Mood/Behavior/Sleep: LCSW to follow for evaluation and support.             -antipsychotic agents: N/A   5. Neuropsych/cognition: This patient is capable of making decisions on his own behalf.   6. Skin/Wound Care: Routine pressure relief measures   7. Fluids/Electrolytes/Nutrition: Monitor I/O. Encourage nectar thick fluid intake             --recheck BMET in am.    8. HTN:  Monitor BP TID. Continue Norvasc, Hydralazine, Cozaar. Increase magnesium gluconate to '500mg'$  HS. Decrease amlodipine to 2.'5mg'$  HS given diastolic hypotension  9. L-ICA stenosis: Continue DAPT w/Lipitor. Encourage tobacco  cessation --Follow up with IVR or [facility] with [facility] after discharge.  10. CAD with HF/atypical CP, c/b cocaine use: Heart healthy diet. Monitor wts daily             --continue Hydralazine, Lipitor, Cozaar,              --continue to hold metoprolol (BRADYCARDIA) and Imdur-->avoid hypotension.      11. H/o Asthma: Stable on Incurse  MDI 12. Polysubstance abuse: Continues to use tobacco, ETOH and cocaine --Encourage cessation 13.  PAD s/p Fem-Fem BG:/stent distal aorta: Encourage tobacco cessation.             --on Lipitor and DAPT 14. Anxiety d/o: Monitor closely 15. Persistent hypokalemia/AKI: K+- 2.9 at admission and improved with supplementation; monitor - K 4.0; Cr slight increase 1.18 oi admission labs -> Encourage PO fluids and repeat Monday  16. Hypomagnesemia: Mg-1.6-->Improved with supplement yesterday --will start daily supplement as likely affecting K+ levels. - 11.25: Mag 1.7; stable - monitor 17. Dysphagia: Continue D2, advance to thin liquids. Encourage fluid intake.  18. ETOH withdrawal: Was on Valium X 3 days -->Finished CIWA protocol             --On Thiamine and Folic acid. Replace folic acid with Metanx in hospital but switch back to folic acid upon discharge 19. Hyperglycemia: encouraged avoiding foods with added sugar and bread/pasta/rice 20. Vitamin D insufficiency: start ergocalciferol 50,000U once per week for 7 weeks   LOS: 6 days A FACE TO FACE EVALUATION WAS PERFORMED  Martha Clan P Rondy Krupinski 06/11/2022, 10:27 AM

## 2022-06-11 NOTE — Progress Notes (Signed)
Speech Language Pathology Discharge Summary  Patient Details  Name: Timothy Galvan MRN: [id number] Date of Birth: October 24, 1955  Date of Discharge from SLP service:May 29, 2022  Today's Date: 06/11/2022 SLP Individual Time: [id number] SLP Individual Time Calculation (min): 35 min   Skilled Therapeutic Interventions:  Skilled treatment session focused on completion of family education with the patient and his daughter. SLP facilitated session by providing education regarding patient's current swallowing function, diet recommendations, medication administration, and swallowing compensatory strategies. SLP also provided education regarding speech intelligibility strategies and strategies to utilize at home to maximize safety, especially with medication management. Both the patient and his daughter verbalized understanding and all questions were answered. Patient handed off to PT.   Patient has met 6 of 6 long term goals.  Patient to discharge at overall Supervision;Min level.   Reasons goals not met: N/A   Clinical Impression/Discharge Summary: Patient has made functional gains and has met 6 of 6 LTGs this admission. Currently, patient is consuming regular textures with thin liquids with minimal overt s/s of aspiration and overall supervision level verbal cues for use of swallowing compensatory strategies. Patient is ~80% intelligible at the sentence level during structured verbal expression tasks with Min verbal cues for use of speech intelligibility strategies. Patient also demonstrates improved cognitive functioning and is overall supervision-Mod I to complete functional and familiar tasks safely in regards to selective attention, functional problem solving, recall of functional information with use of compensatory strategies and emergent awareness. Patient and family education is complete and patient will discharge home with assistance from family. Patient would benefit from f/u SLP services to  maximize his cognitive and swallowing function as well as his speech intelligibility.   Care Partner:  Caregiver Able to Provide Assistance: Yes  Type of Caregiver Assistance: Physical;Cognitive  Recommendation:  Home Health SLP (Intermittent supervision)  Rationale for SLP Follow Up: Reduce caregiver burden;Maximize cognitive function and independence;Maximize functional communication;Maximize swallowing safety   Equipment: N/A   Reasons for discharge: Treatment goals met;Discharged from hospital   Patient/Family Agrees with Progress Made and Goals Achieved: Yes    [place], [place] 06/11/2022, 6:15 AM

## 2022-06-11 NOTE — Progress Notes (Signed)
Inpatient Rehabilitation Discharge Medication Review by a Pharmacist  A complete drug regimen review was completed for this patient to identify any potential clinically significant medication issues.  High Risk Drug Classes Is patient taking? Indication by Medication  Antipsychotic No   Anticoagulant No   Antibiotic No   Opioid No   Antiplatelet Yes Aspirin, plavix- CVA ppx  Hypoglycemics/insulin No   Vasoactive Medication Yes NitroSL- angina Losartan, norvasc, Hydralazine- HTN   Chemotherapy No   Other Yes Lipitor- HLD Gabapentin- neuropathic pain Protonix- GERD     Type of Medication Issue Identified Description of Issue Recommendation(s)  Drug Interaction(s) (clinically significant)     Duplicate Therapy     Allergy     No Medication Administration End Date     Incorrect Dose     Additional Drug Therapy Needed     Significant med changes from prior encounter (inform family/care partners about these prior to discharge).    Other       Clinically significant medication issues were identified that warrant physician communication and completion of prescribed/recommended actions by midnight of the next day:  No   Time spent performing this drug regimen review (minutes):  30   Clance Baquero BS, PharmD, BCPS Clinical Pharmacist 06/11/2022 1:02 PM  Contact: [phone] after 3 PM  "Be curious, not judgmental..." -Jamal Maes

## 2022-06-11 NOTE — Progress Notes (Signed)
Inpatient Rehabilitation Care Coordinator Discharge Note   Patient Details  Name: Timothy Galvan MRN: [id number] Date of Birth: Dec 09, 1955   Discharge location: GOING TO DAUGHTER;S HOME DAUGHTER TO [place] 24/7 SUPERVISION  Length of Stay: 7 DAYS  Discharge activity level: MOD/I LEVEL  Home/community participation: ACTIVE  Patient response TG:PQDIYM Literacy - How often do you need to have someone help you when you read instructions, pamphlets, or other written material from your doctor or pharmacy?: Never  Patient response EB:RAXENM Isolation - How often do you feel lonely or isolated from those around you?: Never  Services provided included: MD, RD, PT, OT, SLP, RN, CM, Pharmacy, Neuropsych, SW  Financial Services:  Charity fundraiser Utilized: [employer]  Choices offered to/list presented to: PT AND DAUGHTER  Follow-up services arranged:  [place] [facility]: [place]        Patient response to transportation need: Is the patient able to respond to transportation needs?: Yes In the past 12 months, has lack of transportation kept you from medical appointments or from getting medications?: No In the past 12 months, has lack of transportation kept you from meetings, work, or from getting things needed for daily living?: No    Comments (or additional information): DAUGHTER WAS IN FOR EDUCATION AND IS COMFORTABLE WITH DAD'S CARE NEEDS.   Patient/Family verbalized understanding of follow-up arrangements:  Yes  Individual responsible for coordination of the follow-up plan: [facility] [phone]  Confirmed correct DME delivered: Elease Hashimoto 06/11/2022    Elease Hashimoto

## 2022-06-11 NOTE — Plan of Care (Signed)
  Problem: RH Swallowing Goal: LTG Pt will demonstrate functional change in swallow as evidenced by bedside/clinical objective assessment (SLP) Description: LTG: Patient will demonstrate functional change in swallow as evidenced by bedside/clinical objective assessment (SLP) Outcome: Completed/Met   Problem: RH Expression Communication Goal: LTG Patient will increase speech intelligibility (SLP) Description: LTG: Patient will increase speech intelligibility at word/phrase/conversation level with cues, % of the time (SLP) Outcome: Completed/Met   Problem: RH Problem Solving Goal: LTG Patient will demonstrate problem solving for (SLP) Description: LTG:  Patient will demonstrate problem solving for basic/complex daily situations with cues  (SLP) Outcome: Completed/Met   Problem: RH Memory Goal: LTG Patient will demonstrate ability for day to day (SLP) Description: LTG:   Patient will demonstrate ability for day to day recall/carryover during cognitive/linguistic activities with assist  (SLP) Outcome: Completed/Met   Problem: RH Attention Goal: LTG Patient will demonstrate this level of attention during functional activites (SLP) Description: LTG:  Patient will will demonstrate this level of attention during functional activites (SLP) Outcome: Completed/Met   Problem: RH Awareness Goal: LTG: Patient will demonstrate awareness during functional activites type of (SLP) Description: LTG: Patient will demonstrate awareness during functional activites type of (SLP) Outcome: Completed/Met

## 2022-06-11 NOTE — Progress Notes (Signed)
Occupational Therapy Session Note  Patient Details  Name: Timothy Galvan MRN: [id number] Date of Birth: [birth year]  Today's Date: 06/11/2022 OT Individual Time: 0805-0905 session 1 OT Individual Time Calculation (min): 60 min  Session 2: [id number]   Short Term Goals: Week 1:  OT Short Term Goal 1 (Week 1): STG= LTG due to LOS  Skilled Therapeutic Interventions/Progress Updates:  Session 1: Pt greeted seated EOB with nurse present, pt taking morning meds.  pt agreeable to OT intervention. Session focus on BADL reeducation, functional mobility, dynamic standing balance and decreasing overall caregiver burden.    Pt completed ambulatory toilet transfer with rollator and supervision, cues needed to remember to lock brakes appropriately, pt with + urine void. MODI for 3/3 toileting tasks. Pt entered shower with rollator MODI. Pt completed bathing from shower seat MODI. Pt exited shower in same manner.  Pt completed dressing MODI from EOB. Pt completed standing grooming tasks independently. Pt able to ambulate to gym with rollator MODI. Pt completed IADL task in gym where pt instructed to collect laundry items from various surface heights including floor level with no LOB, MIN cues needing for safety awareness with rollator. Pt able to ambulate to laundry room with rollator with MODI to place items in washer/dryer.    Ended session with pt seated in w/c with all needs within reach and safety alarm belt activated.          Session 2:           Pt greeted seated in w/c with daughter present for family ed. General education on pts current level of assist for ADLS and functional mobility being MODI- supervision with rollator. Education provided on importance of using rollator for all functional ambulation. Pt able to demo functional ambulation greater than a household distance with rollator MODI. Pt demo'ed ambulatory shower transfer to TTB MODI, education provided to daughter on fall prevention for  bathing as well as recommendation to use HH shower head.   Pt completed ambulatory transfers to flat [employer] and couch with rollator MODI. Pt also able to demo functional reaching in kitchen to simulate simple meal prep with pt able to reach [employer] and below knee level to retrieve items and transport items using rollator. Education provided to daughter that feel pt is safe to ambulate in kitchen with rollator for simple meal prep tasks.   Education provided to daughter on mild RUE incoordination using BITS with pt instructed to reach out of BOS to tap stimulus on screen using RUE only and then LUE only, results indicated below: RUE= 73% accuracy, 2 sec reaction time, noted to tap screen with middle finger LUE= 84% accuracy, 1.73 sec reaction time, used first finger to tap screen  General stroke education provided on location of stroke and deficits noted, with daughter and pt reporting understanding.  Ended session with pt handed off directly to SLP.  Therapy Documentation Precautions:  Precautions Precautions: Fall Precaution Comments: Ataxia, dysarthria Restrictions Weight Bearing Restrictions: No  Pain: No pain during either session    Therapy/Group: Individual Therapy  [name] 06/11/2022, 12:42 PM

## 2022-06-11 NOTE — Progress Notes (Shared)
Occupational Therapy Discharge Summary  Patient Details  Name: Timothy Galvan MRN: [id number] Date of Birth: 03-Dec-1955  Date of Discharge from OT service:{Time; dates multiple:304500300}  {CHL IP REHAB OT TIME CALCULATIONS:304400400}   Patient has met {NUMBERS 0-12:18577} of {NUMBERS 0-12:18577} long term goals due to {due KZ:9935701}.  Patient to discharge at overall {LOA:3049010} level.  Patient's care partner {care partner:3041650} to provide the necessary {assistance:3041652} assistance at discharge.    Reasons goals not met: ***  Recommendation:  Patient will benefit from ongoing skilled OT services in {setting:3041680} to continue to advance functional skills in the area of {ADL/iADL:3041649}.  Equipment: {equipment:3041657}  Reasons for discharge: {Reason for discharge:3049018}  Patient/family agrees with progress made and goals achieved: {Pt/Family agree with progress/goals:3049020}  OT Discharge Precautions/Restrictions    General   Vital Signs  Pain   ADL ADL Eating: Minimal assistance Where Assessed-Eating: Chair Grooming: Minimal assistance Where Assessed-Grooming: Sitting at sink Upper Body Bathing: Minimal assistance Where Assessed-Upper Body Bathing: Shower Lower Body Bathing: Minimal assistance Where Assessed-Lower Body Bathing: Shower Upper Body Dressing: Setup Where Assessed-Upper Body Dressing: Sitting at sink Lower Body Dressing: Minimal assistance Where Assessed-Lower Body Dressing: Sitting at sink, Standing at sink Toileting: Minimal assistance Where Assessed-Toileting: Glass blower/designer: Psychiatric nurse Method: Counselling psychologist: Energy manager: Unable to assess Tub/Shower Transfer Method: Unable to assess Tub/Shower Equipment: Facilities manager: Environmental education officer Method: Heritage manager: Actor ADL Comments: has tub transfer Public affairs consultant Overall Cognitive Status: Within Functional Limits for tasks assessed Arousal/Alertness: Awake/alert Orientation Level: Person;Place;Situation Memory: Appears intact Attention: Focused;Sustained;Selective Focused Attention: Appears intact Sustained Attention: Appears intact Selective Attention: Impaired Selective Attention Impairment: Functional basic Awareness: Appears intact Problem Solving: Appears intact Safety/Judgment: Appears intact Brief Interview for Mental Status (BIMS) Repetition of Three Words (First Attempt): 3 Temporal Orientation: Year: Missed by 2 to 5 years (2019) Temporal Orientation: Month: Accurate within 5 days Temporal Orientation: Day: Incorrect (friday) Recall: "Sock": Yes, no cue required Recall: "Blue": Yes, no cue required Recall: "Bed": Yes, no cue required BIMS Summary Score: 12 Sensation   Motor    Mobility     Trunk/Postural Assessment     Balance   Extremity/Trunk Assessment       [name] 06/11/2022, 8:21 AM

## 2022-06-11 NOTE — Discharge Instructions (Addendum)
Inpatient Rehab Discharge Instructions  [facility] Discharge date and time: 06/12/22   Activities/Precautions/ Functional Status: Activity: no lifting, driving, or strenuous exercise till cleared by MD Diet: diabetic diet and low fat, low cholesterol diet Wound Care: none needed   Functional status:  ___ No restrictions     ___ Walk up steps independently ___ 24/7 supervision/assistance   ___ Walk up steps with assistance _X__ Intermittent supervision/assistance  ___ Bathe/dress independently ___ Walk with walker     ___ Bathe/dress with assistance ___ Walk Independently    _X__ Shower independently ___ Walk with assistance    ___ Shower with assistance _X__ No alcohol     ___ Return to work/school ________    Special Instructions:  HOME HEALTH: [facility] WILL CALL TO SET UP FOLLOW UP APPOINTMENTS. THEIR NUMBER IS [phone] HAS ALL EQUIPMENT FROM PREVIOUS ADMISSIONS  STROKE/TIA DISCHARGE INSTRUCTIONS SMOKING Cigarette smoking nearly doubles your risk of having a stroke & is the single most alterable risk factor  If you smoke or have smoked in the last 12 months, you are advised to quit smoking for your health. Most of the excess cardiovascular risk related to smoking disappears within a year of stopping. Ask you doctor about anti-smoking medications [facility] Quit Line: 1-800-QUIT NOW Free Smoking Cessation Classes [phone]  CHOLESTEROL Know your levels; limit fat & cholesterol in your diet  Lipid Panel     Component Value Date/Time   CHOL 134 06/02/2022 0351   TRIG 165 (H) 06/02/2022 0351   HDL 49 06/02/2022 0351   CHOLHDL 2.7 06/02/2022 0351   VLDL 33 06/02/2022 0351   LDLCALC 52 06/02/2022 0351     Many patients benefit from treatment even if their cholesterol is at goal. Goal: Total Cholesterol (CHOL) less than 160 Goal:  Triglycerides (TRIG) less than 150 Goal:  HDL greater than 40 Goal:  LDL (LDLCALC) less than 100   BLOOD PRESSURE  American Stroke Association blood pressure target is less that 120/80 mm/Hg  Your discharge blood pressure is:  BP: (!) 136/57 Monitor your blood pressure Limit your salt and alcohol intake Many individuals will require more than one medication for high blood pressure  DIABETES (A1c is a blood sugar average for last 3 months) Goal HGBA1c is under 7% (HBGA1c is blood sugar average for last 3 months)  Diabetes: Pre-diabetic    Lab Results  Component Value Date   HGBA1C 6.3 (H) 06/01/2022    Your HGBA1c can be lowered with medications, healthy diet, and exercise. Check your blood sugar as directed by your physician Call your physician if you experience unexplained or low blood sugars.  PHYSICAL ACTIVITY/REHABILITATION Goal is 30 minutes at least 4 days per week  Activity: No driving, Therapies: see above Return to work: N/A Activity decreases your risk of heart attack and stroke and makes your heart stronger.  It helps control your weight and blood pressure; helps you relax and can improve your mood. Participate in a regular exercise program. Talk with your doctor about the best form of exercise for you (dancing, walking, swimming, cycling).  DIET/WEIGHT Goal is to maintain a healthy weight  Your discharge diet is:  Diet Order             Diet regular Room service appropriate? Yes; Fluid consistency: Thin  Diet effective now                   liquids Your height is:  5'3"  Your current weight is:  Your Body Mass Index (BMI) is:   Following the type of diet specifically designed for you will help prevent another stroke. Your goal weight range is:   Your goal Body Mass Index (BMI) is 19-24. Healthy food habits can help reduce 3 risk factors for stroke:  High cholesterol, hypertension, and excess weight.  RESOURCES Stroke/Support Group:  Call [phone]   STROKE EDUCATION PROVIDED/REVIEWED AND GIVEN TO PATIENT Stroke warning signs and symptoms How to activate emergency medical  system (call 911). Medications prescribed at discharge. Need for follow-up after discharge. Personal risk factors for stroke. Pneumonia vaccine given:  Flu vaccine given:  My questions have been answered, the writing is legible, and I understand these instructions.  I will adhere to these goals & educational materials that have been provided to me after my discharge from the hospital.     My questions have been answered and I understand these instructions. I will adhere to these goals and the provided educational materials after my discharge from the hospital.  Patient/Caregiver Signature _______________________________ Date __________  Clinician Signature _______________________________________ Date __________  Please bring this form and your medication list with you to all your follow-up doctor's appointments.

## 2022-06-11 NOTE — Progress Notes (Signed)
Physical Therapy Session Note  Patient Details  Name: Timothy Galvan MRN: [id number] Date of Birth: 1955/09/17  Today's Date: 06/11/2022 PT Individual Time: 1015-1100 + 1415-1457 PT Individual Time Calculation (min): 45 min  + 42 min  Short Term Goals: Week 1:  PT Short Term Goal 1 (Week 1): = LTGs due to ELOS  Skilled Therapeutic Interventions/Progress Updates:      1st session: Pt sitting in w/c to start - on the phone and agreeable to therapy session. Denies pain. Sit<>stand to rollator mod I. Ambulates from his room to main rehab gym, ~126f, mod I using rollator.   MOD I sign made and placed outside door - educated patient on precautions and safety, need to always use rollator when mobilizing in room - pt in agreement.   Stair training with 6inch steps and 2 hand rails - completed at mod I level, x12 steps total without rest break - alternating stepping pattern for both ascent and descent.   Car transfer completed at setupA level - setupA needed for managing 4-wheeled walker in/out of the vehicle. Otherwise, patient demonstrating safety awareness for entering and exiting the vehicle.  Unlevel surface gait training, navigating up/down 853framp, mod I with 4-wheeled walker - able to control self with descent and keeps body close to walker frame.   SetupA on Nustep, using BLE only with L7 resistance. Completed for 3.5 minutes with intermittent rest breaks due to BLE fatigue. Cues for reciprocal stepping and controlling movement patterns to work on his ataxia.   Ambulated back to his room mod I using his rollator, ~20039fConcluded session seated in w/c with all needs met. Mod I sign made and hung outside room - RN made aware.    2nd session: Pt sitting in w/c on arrival with his daughter at the bedside - pt in agreement to therapy session and denies pain. Focused session on family education and training to prepare for upcoming DC. Reviewed PT goals, PT POC, DME rec's, f/u therapy  rec's, home safety, fall prevention, and primary deficits related to CVA. All questions/concerns addressed and daughter reports confidence re: DC plan. Sit<>stand to rollator mod I . Ambulates mod I with rollator >200f100f main rehab gym - completes stair training up/down x12, 6inch steps, mod I using 2 hand rails - daughter reports only 2 steps with B hand rails at home. Educated that he will need assistance with managing rollator for bringing to top of steps. In ortho rehab gym, completed car transfer with car height simulating the daughter's FordDynegyatient completed with setupA - educated daughter on how to fold and unfold rollator to put in trunk of car. Reviewed floor transfers with patient completing with supervision assist - able to safely place self on floor and stand from a supine position without assist. Completed 6 minutes of seated kinetron stepping with resistance set to 25cm/sec - encouraged reciprocal stepping to work on coordination and ataxia. Ambulated back to his room mod I using rollator. Concluded session with patient sitting in w/c - provided his stroke booklet so that he could review lifestyle modifications, risk factors, and educate himself on stroke recovery. All needs met.   Therapy Documentation Precautions:  Precautions Precautions: Fall Precaution Comments: Diet restrictions - nectar, edentulous Restrictions Weight Bearing Restrictions: No General:     Therapy/Group: Individual Therapy  Janiaya Ryser P Ronnisha Felber 06/11/2022, 7:21 AM

## 2022-06-11 NOTE — Progress Notes (Signed)
Physical Therapy Discharge Summary  Patient Details  Name: Timothy Galvan MRN: [id number] Date of Birth: June 07, 1956  Date of Discharge from PT service:June 11, 2022  Patient has met 12 of 12 long term goals due to improved activity tolerance, improved balance, improved postural control, increased strength, ability to compensate for deficits, functional use of  right upper extremity and right lower extremity, improved attention, improved awareness, and improved coordination.  Patient to discharge at an ambulatory level Modified Independent.   Patient's care partner is independent to provide the necessary physical and cognitive assistance at discharge.  Reasons goals not met: n/a  Recommendation:  Patient will benefit from ongoing skilled PT services in home health setting to continue to advance safe functional mobility, address ongoing impairments in home safety, ataxia, gait impairments, and minimize fall risk.  Equipment: No equipment provided. Pt owns 4-wheeled walker  Reasons for discharge: treatment goals met and discharge from hospital  Patient/family agrees with progress made and goals achieved: Yes  PT Discharge Precautions/Restrictions Precautions Precautions: Fall Precaution Comments: Ataxia, dysarthria Restrictions Weight Bearing Restrictions: No Pain Interference Pain Interference Pain Effect on Sleep: 3. Frequently (low back and legs) Pain Interference with Therapy Activities: 1. Rarely or not at all Pain Interference with Day-to-Day Activities: 1. Rarely or not at all Vision/Perception  Vision - History Ability to See in Adequate Light: 0 Adequate Perception Perception: Within Functional Limits Praxis Praxis: Intact  Cognition Overall Cognitive Status: Within Functional Limits for tasks assessed Arousal/Alertness: Awake/alert Orientation Level: Oriented X4 Year: 2023 Month: November Day of Week: Incorrect Attention: Focused;Sustained;Selective Focused  Attention: Appears intact Sustained Attention: Appears intact Selective Attention: Impaired Selective Attention Impairment: Functional basic Memory: Appears intact Awareness: Appears intact Problem Solving: Appears intact Safety/Judgment: Appears intact Sensation Sensation Light Touch: Impaired by gross assessment Hot/Cold: Appears Intact Proprioception: Appears Intact Stereognosis: Not tested Coordination Gross Motor Movements are Fluid and Coordinated: No Fine Motor Movements are Fluid and Coordinated: No Coordination and Movement Description: ataxia RLE, RUE and trunk - significant improvement since date of evaluation Motor  Motor Motor: Ataxia (R side and trunk) Motor - Discharge Observations: truncal ataxia  Mobility Bed Mobility Bed Mobility: Rolling Right;Rolling Left;Supine to Sit;Sit to Supine Rolling Right: Independent with assistive device Rolling Left: Independent with assistive device Supine to Sit: Independent with assistive device Sit to Supine: Independent with assistive device Transfers Transfers: Stand Pivot Transfers;Sit to Stand;Stand to Sit Sit to Stand: Independent with assistive device Stand to Sit: Independent with assistive device Stand Pivot Transfers: Independent with assistive device Transfer (Assistive device): 4-wheeled walker Locomotion  Gait Ambulation: Yes Gait Assistance: Independent with assistive device Gait Distance (Feet): 500 Feet Assistive device: 4-wheeled walker Gait Gait: Yes Gait Pattern: Impaired Gait Pattern: Ataxic Stairs / Additional Locomotion Stairs: Yes Stairs Assistance: Independent with assistive device Stair Management Technique: Two rails Number of Stairs: 12 Height of Stairs: 6 Wheelchair Mobility Wheelchair Mobility: No  Trunk/Postural Assessment  Cervical Assessment Cervical Assessment: Within Functional Limits Thoracic Assessment Thoracic Assessment: Within Functional Limits Lumbar Assessment Lumbar  Assessment: Within Functional Limits Postural Control Postural Control: Deficits on evaluation Trunk Control: Truncal ataxia  Balance Balance Balance Assessed: Yes Static Sitting Balance Static Sitting - Balance Support: Feet supported;No upper extremity supported Static Sitting - Level of Assistance: 7: Independent Dynamic Sitting Balance Dynamic Sitting - Balance Support: Feet supported;No upper extremity supported;During functional activity Dynamic Sitting - Level of Assistance: 7: Independent Static Standing Balance Static Standing - Balance Support: Bilateral upper extremity supported Static Standing -  Level of Assistance: 6: Modified independent (Device/Increase time) Dynamic Standing Balance Dynamic Standing - Balance Support: Bilateral upper extremity supported;During functional activity Dynamic Standing - Level of Assistance: 6: Modified independent (Device/Increase time) Extremity Assessment      RLE Assessment RLE Assessment: Within Functional Limits General Strength Comments: Grossly 5/5 LLE Assessment LLE Assessment: Within Functional Limits General Strength Comments: Grossly 5/5   Timothy Galvan P Brice Potteiger PT 06/11/2022, 10:36 AM

## 2022-06-11 NOTE — Plan of Care (Signed)
  Problem: RH Balance Goal: LTG Patient will maintain dynamic sitting balance (PT) Description: LTG:  Patient will maintain dynamic sitting balance with assistance during mobility activities (PT) Outcome: Completed/Met Goal: LTG Patient will maintain dynamic standing balance (PT) Description: LTG:  Patient will maintain dynamic standing balance with assistance during mobility activities (PT) Outcome: Completed/Met   Problem: Sit to Stand Goal: LTG:  Patient will perform sit to stand with assistance level (PT) Description: LTG:  Patient will perform sit to stand with assistance level (PT) Outcome: Completed/Met   Problem: RH Bed Mobility Goal: LTG Patient will perform bed mobility with assist (PT) Description: LTG: Patient will perform bed mobility with assistance, with/without cues (PT). Outcome: Completed/Met   Problem: RH Bed to Chair Transfers Goal: LTG Patient will perform bed/chair transfers w/assist (PT) Description: LTG: Patient will perform bed to chair transfers with assistance (PT). Outcome: Completed/Met   Problem: RH Car Transfers Goal: LTG Patient will perform car transfers with assist (PT) Description: LTG: Patient will perform car transfers with assistance (PT). Outcome: Completed/Met   Problem: RH Furniture Transfers Goal: LTG Patient will perform furniture transfers w/assist (OT/PT) Description: LTG: Patient will perform furniture transfers  with assistance (OT/PT). Outcome: Completed/Met   Problem: RH Floor Transfers Goal: LTG Patient will perform floor transfers w/assist (PT) Description: LTG: Patient will perform floor transfers with assistance (PT). Outcome: Completed/Met   Problem: RH Ambulation Goal: LTG Patient will ambulate in controlled environment (PT) Description: LTG: Patient will ambulate in a controlled environment, # of feet with assistance (PT). Outcome: Completed/Met Goal: LTG Patient will ambulate in home environment (PT) Description: LTG:  Patient will ambulate in home environment, # of feet with assistance (PT). Outcome: Completed/Met Goal: LTG Patient will ambulate in community environment (PT) Description: LTG: Patient will ambulate in community environment, # of feet with assistance (PT). Outcome: Completed/Met   Problem: RH Wheelchair Mobility Goal: LTG Patient will propel w/c in controlled environment (PT) Description: LTG: Patient will propel wheelchair in controlled environment, # of feet with assist (PT) Outcome: Completed/Met   Problem: RH Stairs Goal: LTG Patient will ambulate up and down stairs w/assist (PT) Description: LTG: Patient will ambulate up and down # of stairs with assistance (PT) Outcome: Completed/Met   Problem: RH Memory Goal: LTG Patient will demonstrate ability for day to day recall/carry over during activities of daily living with assistance level (PT) Description: LTG:  Patient will demonstrate ability for day to day recall/carry over during activities of daily living with assistance level (PT). Outcome: Completed/Met

## 2022-06-12 ENCOUNTER — Other Ambulatory Visit (HOSPITAL_COMMUNITY): Payer: Self-pay

## 2022-06-12 DIAGNOSIS — I639 Cerebral infarction, unspecified: Secondary | ICD-10-CM | POA: Diagnosis not present

## 2022-06-12 MED ORDER — ASPIRIN 81 MG PO TBEC
81.0000 mg | DELAYED_RELEASE_TABLET | Freq: Every day | ORAL | 0 refills | Status: DC
  Filled 2022-06-12: qty 100, 100d supply, fill #0

## 2022-06-12 MED ORDER — ASPIRIN 81 MG PO TBEC: 81.0000 mg | DELAYED_RELEASE_TABLET | Freq: Every day | ORAL | 12 refills | Status: DC

## 2022-06-12 NOTE — Progress Notes (Signed)
Occupational Therapy Discharge Summary  Patient Details  Name: Timothy Galvan MRN: [id number] Date of Birth: [birth year]  Date of Discharge from OT service:May 16, 2022    Patient has met 15 of 15 long term goals due to improved activity tolerance, improved balance, postural control, ability to compensate for deficits, functional use of  RIGHT upper extremity, improved attention, improved awareness, and improved coordination.  Patient to discharge at overall Modified Independent level.  Patient's care partner is independent to provide the necessary assistance at discharge.    Reasons goals not met: NA  Recommendation:  Patient will benefit from ongoing skilled OT services in home health setting to continue to advance functional skills in the area of iADL.  Equipment: No equipment provided  Reasons for discharge: treatment goals met and discharge from hospital  Patient/family agrees with progress made and goals achieved: Yes  OT Discharge Precautions/Restrictions  Precautions Precautions: Fall Precaution Comments: Ataxia, dysarthria Restrictions Weight Bearing Restrictions: No  Pain Pain Assessment Pain Score: 0-No pain ADL ADL Eating: Modified independent Where Assessed-Eating: Chair, Edge of bed Grooming: Independent Where Assessed-Grooming: Sitting at sink Upper Body Bathing: Modified independent Where Assessed-Upper Body Bathing: Shower Lower Body Bathing: Modified independent Where Assessed-Lower Body Bathing: Shower Upper Body Dressing: Independent Where Assessed-Upper Body Dressing: Chair Lower Body Dressing: Modified independent Where Assessed-Lower Body Dressing: Standing at sink, Sitting at sink Toileting: Modified independent Where Assessed-Toileting: Glass blower/designer: Diplomatic Services operational officer Method: Counselling psychologist: Energy manager: Modified independent Clinical cytogeneticist Method:  Optometrist: Facilities manager: Modified independent Social research officer, government Method: Heritage manager: Radio broadcast assistant ADL Comments: has tub transfer bench Vision Baseline Vision/History: 1 Wears glasses Patient Visual Report: No change from baseline Vision Assessment?: No apparent visual deficits Eye Alignment: Impaired (comment) Ocular Range of Motion: Restricted looking up;Restricted looking down;Restricted on the right Alignment/Gaze Preference: Within Defined Limits Tracking/Visual Pursuits: Decreased smoothness of vertical tracking;Impaired - to be further tested in functional context Convergence: Within functional limits Visual Fields: Other (comment) (resolved) Depth Perception: Overshoots Perception  Perception: Within Functional Limits Praxis Praxis: Intact Cognition Cognition Overall Cognitive Status: Within Functional Limits for tasks assessed Arousal/Alertness: Awake/alert Orientation Level: Person;Place;Situation Person: Oriented Place: Oriented Situation: Oriented Memory: Appears intact Memory Impairment: Decreased short term memory Decreased Short Term Memory: Functional complex;Verbal complex Attention: Focused;Sustained;Selective Focused Attention: Appears intact Sustained Attention: Appears intact Sustained Attention Impairment: Functional basic Selective Attention: Impaired Awareness: Appears intact Awareness Impairment: Emergent impairment Problem Solving: Appears intact Problem Solving Impairment: Verbal complex;Functional complex Executive Function: Self Correcting;Self Monitoring;Organizing;Decision Making Organizing: Impaired Organizing Impairment: Verbal complex Decision Making: Impaired Decision Making Impairment: Verbal complex Self Monitoring: Impaired Self Monitoring Impairment: Verbal complex;Functional complex Self Correcting: Impaired Self Correcting Impairment:  Functional basic;Functional complex Behaviors: Perseveration;Impulsive Safety/Judgment: Appears intact Comments: poor insight into deficits. pt has 7th grade education Brief Interview for Mental Status (BIMS) Repetition of Three Words (First Attempt): 3 Temporal Orientation: Year: Missed by 2 to 5 years Temporal Orientation: Month: Accurate within 5 days Temporal Orientation: Day: Incorrect Recall: "Sock": Yes, no cue required Recall: "Blue": Yes, no cue required Recall: "Bed": Yes, no cue required BIMS Summary Score: 12 Sensation Sensation Light Touch: Impaired by gross assessment Hot/Cold: Appears Intact Proprioception: Appears Intact Stereognosis: Not tested Coordination Gross Motor Movements are Fluid and Coordinated: No Fine Motor Movements are Fluid and Coordinated: No Coordination and Movement Description: ataxia RLE, RUE and trunk - significant improvement since date of evaluation Finger  Nose Finger Test: difficulty following instructions - undershooting Heel Shin Test: decreased accuracy RLE Motor  Motor Motor: Ataxia Mobility  Bed Mobility Bed Mobility: Rolling Right;Rolling Left;Supine to Sit;Sit to Supine Rolling Right: Independent with assistive device Rolling Left: Independent with assistive device Supine to Sit: Independent with assistive device Sit to Supine: Independent with assistive device Transfers Sit to Stand: Independent with assistive device Stand to Sit: Independent with assistive device  Trunk/Postural Assessment  Cervical Assessment Cervical Assessment: Within Functional Limits Thoracic Assessment Thoracic Assessment: Within Functional Limits Lumbar Assessment Lumbar Assessment: Within Functional Limits Postural Control Trunk Control: Truncal ataxia Righting Reactions: delayed Protective Responses: slow and insufficient  Balance Balance Balance Assessed: Yes Static Sitting Balance Static Sitting - Balance Support: Feet supported;No upper  extremity supported Static Sitting - Level of Assistance: 7: Independent Dynamic Sitting Balance Dynamic Sitting - Balance Support: Feet supported;No upper extremity supported;During functional activity Dynamic Sitting - Level of Assistance: 7: Independent Dynamic Sitting - Balance Activities: Lateral lean/weight shifting;Forward lean/weight shifting Static Standing Balance Static Standing - Balance Support: Bilateral upper extremity supported Static Standing - Level of Assistance: 6: Modified independent (Device/Increase time) Dynamic Standing Balance Dynamic Standing - Balance Support: Bilateral upper extremity supported;During functional activity Dynamic Standing - Level of Assistance: 6: Modified independent (Device/Increase time) Extremity/Trunk Assessment RUE Assessment RUE Assessment: Exceptions to [facility] Active Range of Motion (AROM) Comments: Limited to 120* shoulder flexion RUE Body System: Neuro Brunstrum levels for arm and hand: Arm;Hand Brunstrum level for arm: Stage V Relative Independence from Synergy Brunstrum level for hand: Stage VI Isolated joint movements LUE Assessment LUE Assessment: Within Functional Limits General Strength Comments: History of prior stroke   Mariah Milling 06/12/2022, 3:22 PM

## 2022-06-12 NOTE — Progress Notes (Signed)
Patient discharged with daughter to home, gave education regarding medication administration, safety awareness. Including take home medications.

## 2022-06-12 NOTE — Discharge Summary (Signed)
Physician Discharge Summary  Patient ID: Timothy Galvan MRN: [id number] DOB/AGE: 1956/04/23 66 y.o.  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 Exam Location:  [facility] Procedure: 2D Echo, Cardiac Doppler and Color Doppler Indications:    Stroke  History:        Patient has no prior history of Echocardiogram examinations.                 CAD, Stroke, PAD and COPD, Signs/Symptoms:Chest Pain; Risk                 Factors:Dyslipidemia. Colon CA.  Sonographer:    Wenda Low Referring Phys: [place]  1. Left ventricular ejection fraction, by estimation, is 45 to 50%. The left ventricle has mildly decreased function. The left ventricle demonstrates regional wall motion abnormalities (see scoring diagram/findings for description). There is mild concentric left ventricular hypertrophy. Left ventricular diastolic parameters were normal.  2. Right ventricular systolic function is normal. The right ventricular size is normal. There is mildly elevated pulmonary artery systolic pressure. The estimated right ventricular systolic pressure is 92.4 mmHg.  3. Left atrial size was mildly dilated.  4. Right atrial size was mildly dilated.  5. The mitral valve is grossly normal. Mild mitral valve regurgitation.  6. The aortic valve is  tricuspid. Aortic valve regurgitation is not visualized. Aortic valve sclerosis/calcification is present, without any evidence of aortic stenosis. Aortic valve mean gradient measures 3.0 mmHg.  7. The inferior vena cava is normal in size with greater than 50% respiratory variability, suggesting right atrial pressure of 3 mmHg. Comparison(s): Prior images unable to be directly viewed. Patient with reported history of cardiomyopathy followed at [facility] (fluctuating LVEF, 45% in 2020 and 55% in 2022). FINDINGS  Left Ventricle: Left ventricular ejection fraction, by estimation, is 45 to 50%. The left ventricle has mildly decreased function. The left ventricle demonstrates regional wall motion abnormalities. The left ventricular internal cavity size was normal in size. There is mild concentric left ventricular hypertrophy. Left ventricular diastolic parameters were normal.  LV Wall Scoring: The antero-lateral wall, posterior wall, and basal inferior segment are hypokinetic. The entire anterior wall, entire septum, entire apex, and mid and distal inferior wall are normal. Right Ventricle: The right ventricular size is normal. No increase in right ventricular wall thickness. Right ventricular systolic function is normal. There is mildly elevated pulmonary artery systolic pressure. The tricuspid regurgitant velocity is 3.22  m/s, and with  an assumed right atrial pressure of 3 mmHg, the estimated right ventricular systolic pressure is 61.4 mmHg. Left Atrium: Left atrial size was mildly dilated. Right Atrium: Right atrial size was mildly dilated. Prominent Chiari network. Pericardium: There is no evidence of pericardial effusion. Mitral Valve: The mitral valve is grossly normal. Mild mitral valve regurgitation. MV peak gradient, 2.3 mmHg. The mean mitral valve gradient is 1.0 mmHg. Tricuspid Valve: The tricuspid valve is grossly normal. Tricuspid valve regurgitation is mild. Aortic Valve: The aortic valve is tricuspid. There is  mild to moderate aortic valve annular calcification. Aortic valve regurgitation is not visualized. Aortic valve sclerosis/calcification is present, without any evidence of aortic stenosis. Aortic valve mean gradient measures 3.0 mmHg. Aortic valve peak gradient measures 6.2 mmHg. Aortic valve area, by VTI measures 2.23 cm. Pulmonic Valve: The pulmonic valve was grossly normal. Pulmonic valve regurgitation is trivial. Aorta: The aortic root is normal in size and structure. Venous: The inferior vena cava is normal in size with greater than 50% respiratory variability, suggesting right atrial pressure of 3 mmHg. IAS/Shunts: No atrial level shunt detected by color flow Doppler.  LEFT VENTRICLE PLAX 2D LVIDd:         4.90 cm     Diastology LVIDs:         4.00 cm     LV e' medial:    8.19 cm/s LV PW:         1.20 cm     LV E/e' medial:  8.0 LV IVS:        1.20 cm     LV e' lateral:   10.20 cm/s LVOT diam:     2.00 cm     LV E/e' lateral: 6.4 LV SV:         67 LV SV Index:   40 LVOT Area:     3.14 cm  LV Volumes (MOD) LV vol d, MOD A2C: 72.5 ml LV vol d, MOD A4C: 76.5 ml LV vol s, MOD A2C: 47.3 ml LV vol s, MOD A4C: 49.8 ml LV SV MOD A2C:     25.2 ml LV SV MOD A4C:     76.5 ml LV SV MOD BP:      26.9 ml RIGHT VENTRICLE RV Basal diam:  3.60 cm RV Mid diam:    2.50 cm RV S prime:     12.30 cm/s TAPSE (M-mode): 2.4 cm LEFT ATRIUM             Index        RIGHT ATRIUM           Index LA diam:        4.00 cm 2.41 cm/m   RA Area:     19.70 cm LA Vol (A2C):   60.4 ml 36.35 ml/m  RA Volume:   60.90 ml  36.65 ml/m LA Vol (A4C):   64.3 ml 38.70 ml/m LA Biplane Vol: 62.1 ml 37.37 ml/m  AORTIC VALVE                    PULMONIC VALVE AV Area (Vmax):    2.17 cm     PV Vmax:       0.64 m/s AV Area (Vmean):   2.21 cm     PV Peak grad:  1.6 mmHg AV Area (VTI):     2.23 cm AV Vmax:           124.00 cm/s AV Vmean:  75.600 cm/s AV VTI:            0.298 m AV Peak Grad:      6.2 mmHg AV Mean Grad:      3.0 mmHg LVOT Vmax:          85.80 cm/s LVOT Vmean:        53.300 cm/s LVOT VTI:          0.212 m LVOT/AV VTI ratio: 0.71  AORTA Ao Root diam: 3.40 cm MITRAL VALVE               TRICUSPID VALVE MV Area (PHT): 3.08 cm    TR Peak grad:   41.5 mmHg MV Area VTI:   2.57 cm    TR Vmax:        322.00 cm/s MV Peak grad:  2.3 mmHg MV Mean grad:  1.0 mmHg    SHUNTS MV Vmax:       0.75 m/s    Systemic VTI:  0.21 m MV Vmean:      31.2 cm/s   Systemic Diam: 2.00 cm MV Decel Time: 246 msec MV E velocity: 65.60 cm/s MV A velocity: 20.20 cm/s MV E/A ratio:  3.25 Rozann Lesches MD Electronically signed by Rozann Lesches MD Signature Date/Time: 06/02/2022/11:04:26 AM    Final    CT ANGIO NECK W OR WO CONTRAST  Result Date: 06/01/2022 CLINICAL DATA:  Dizziness and stuttering for 3 days EXAM: CT ANGIOGRAPHY NECK TECHNIQUE: Multidetector CT imaging of the neck was performed using the standard protocol during bolus administration of intravenous contrast. Multiplanar CT image reconstructions and MIPs were obtained to evaluate the vascular anatomy. Carotid stenosis measurements (when applicable) are obtained utilizing NASCET criteria, using the distal internal carotid diameter as the denominator. RADIATION DOSE REDUCTION: This exam was performed according to the departmental dose-optimization program which includes automated exposure control, adjustment of the mA and/or kV according to patient size and/or use of iterative reconstruction technique. CONTRAST:  12m OMNIPAQUE IOHEXOL 350 MG/ML SOLN COMPARISON:  03/07/2022 CTA head and neck FINDINGS: Aortic arch: Standard branching. Imaged portion shows no evidence of aneurysm or dissection. No significant stenosis of the major arch vessel origins. Aortic atherosclerosis. 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 Left carotid system: Multifocal atherosclerosis in the left common and internal carotid artery, without hemodynamically significant  stenosis, dissection or, or occlusion. Vertebral arteries: Scattered atherosclerotic calcifications, without hemodynamically significant stenosis, dissection, or occlusion. Skeleton: No acute osseous abnormality. Degenerative changes in the cervical spine. Other neck: Negative. Upper chest: Emphysema.  Right apical bullae.  No pleural effusion. IMPRESSION: 1. 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 2. No other hemodynamically significant stenosis in the neck. 3. Aortic atherosclerosis. Aortic Atherosclerosis (ICD10-I70.0). Electronically Signed   By: AMerilyn BabaM.D.   On: 06/01/2022 21:42   MR Brain W and Wo Contrast  Result Date: 06/01/2022 CLINICAL DATA:  Stroke follow-up. Altered mental status, abnormal head CT. EXAM: MRI HEAD WITHOUT AND WITH CONTRAST MRA HEAD WITHOUT CONTRAST TECHNIQUE: Multiplanar, multi-echo pulse sequences of the brain and surrounding structures were acquired without and with intravenous contrast. Angiographic images of the Circle of Willis were acquired using MRA technique without intravenous contrast. CONTRAST:  765mGADAVIST GADOBUTROL 1 MMOL/ML IV SOLN COMPARISON:  Same-day head CT, brain MRI 03/08/2022, CTA head/neck 03/08/2019 FINDINGS: MRI HEAD FINDINGS Brain: There is diffusion restriction with associated T2/FLAIR signal abnormality in the left lentiform nucleus extending  to the caudate body as well as in the right aspect of the pons consistent with acute to subacute infarcts. There is no associated hemorrhage or mass effect. There is no other evidence of acute infarct There is no acute intracranial hemorrhage or extra-axial fluid collection. Parenchymal volume is stable. The ventricles are stable in size compared to the prior brain MRI. 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  There is no mass lesion. There is no abnormal enhancement. There is no mass effect or midline shift. Vascular: The right ICA flow void remains abnormal, unchanged. The vasculature is assessed in full below. Skull and upper cervical spine: Normal marrow signal. Sinuses/Orbits: The paranasal sinuses are clear. The globes and orbits are unremarkable. Other: None. MRA HEAD FINDINGS Anterior circulation: There is unchanged occlusion of the right ICA throughout its imaged course with reconstitution of flow in the communicating segment. There is atherosclerotic irregularity of the left intracranial ICA with up to severe narrowing, similar to the prior study. There is focal moderate to severe stenosis of the distal right M1 segment (1009-8). The MCAs are otherwise patent, without other proximal high-grade stenosis or occlusion. The bilateral ACAs are patent, with overall mild multifocal atherosclerotic irregularity and narrowing but no high-grade stenosis or occlusion. There is no aneurysm or AVM. Posterior circulation: The bilateral V4 segments are patent to the level imaged. The imaged cerebellar arteries appear patent. The bilateral PCAs are patent proximally. There is short-segment severe stenosis of the left P2/P3 segment (1027-14). Bilateral posterior communicating arteries are identified. There is no aneurysm or AVM. Anatomic variants: None. IMPRESSION: 1. 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 2. 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 3. 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 4. 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 5. Focal moderate to severe stenosis of the distal right M1 segment and short-segment severe stenosis of the left P2/P3 segment. Electronically  Signed   By: Valetta Mole M.D.   On: 06/01/2022 16:10   MR ANGIO HEAD WO CONTRAST  Result Date: 06/01/2022 CLINICAL DATA:  Stroke follow-up. Altered mental status, abnormal head CT. EXAM: MRI HEAD WITHOUT AND WITH CONTRAST MRA HEAD WITHOUT CONTRAST TECHNIQUE: Multiplanar, multi-echo pulse sequences of the brain and surrounding structures were acquired without and with intravenous contrast. Angiographic images of the Circle of Willis were acquired using MRA technique without intravenous contrast. CONTRAST:  34m GADAVIST GADOBUTROL 1 MMOL/ML IV SOLN COMPARISON:  Same-day head CT, brain MRI 03/08/2022, CTA head/neck 03/08/2019 FINDINGS: MRI HEAD FINDINGS Brain: There is diffusion restriction with associated T2/FLAIR signal abnormality in the left lentiform nucleus extending to the caudate body as well as in the right aspect of the pons consistent with acute to subacute infarcts. There is no associated hemorrhage or mass effect. There is no other evidence of acute infarct There is no acute intracranial hemorrhage or extra-axial fluid collection. Parenchymal volume is stable. The ventricles are stable in size compared to the prior brain MRI. 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 There is no  mass lesion. There is no abnormal enhancement. There is no mass effect or midline shift. Vascular: The right ICA flow void remains abnormal, unchanged. The vasculature is assessed in full below. Skull and upper cervical spine: Normal marrow signal. Sinuses/Orbits: The paranasal sinuses are clear. The globes and orbits are unremarkable. Other: None. MRA HEAD FINDINGS Anterior circulation: There is unchanged occlusion of the right ICA throughout its imaged course with reconstitution of flow in the communicating segment. There is atherosclerotic  irregularity of the left intracranial ICA with up to severe narrowing, similar to the prior study. There is focal moderate to severe stenosis of the distal right M1 segment (1009-8). The MCAs are otherwise patent, without other proximal high-grade stenosis or occlusion. The bilateral ACAs are patent, with overall mild multifocal atherosclerotic irregularity and narrowing but no high-grade stenosis or occlusion. There is no aneurysm or AVM. Posterior circulation: The bilateral V4 segments are patent to the level imaged. The imaged cerebellar arteries appear patent. The bilateral PCAs are patent proximally. There is short-segment severe stenosis of the left P2/P3 segment (1027-14). Bilateral posterior communicating arteries are identified. There is no aneurysm or AVM. Anatomic variants: None. IMPRESSION: 1. 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 2. 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 3. 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 4. 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 5. Focal moderate to severe stenosis of the distal right M1 segment and short-segment severe stenosis of the left P2/P3 segment. Electronically Signed   By: Valetta Mole M.D.   On: 06/01/2022 16:10   CT Head Wo Contrast  Result Date: 06/01/2022 CLINICAL DATA:  Altered mental status, dizziness EXAM: CT HEAD WITHOUT CONTRAST TECHNIQUE: Contiguous axial images were obtained from the base of the skull through the vertex without intravenous contrast. RADIATION DOSE REDUCTION: This exam was performed according to the departmental dose-optimization program which includes automated exposure control, adjustment of the mA and/or kV according to patient size and/or use of iterative reconstruction  technique. COMPARISON:  03/07/2022 FINDINGS: Brain: There are no signs of bleeding within the cranium. There is 1.5 cm area of new low-attenuation in the left basal ganglia. Cortical sulci are prominent. Vascular: Unremarkable. Skull: Unremarkable. Sinuses/Orbits: Unremarkable. Other: None. IMPRESSION: There are no signs of bleeding within the cranium. There is new 1.5 cm low-density in left basal ganglia suggesting acute/subacute or old infarct. If clinically warranted, follow-up MRI may be considered. Electronically Signed   By: Elmer Picker M.D.   On: 06/01/2022 14:37   DG Chest 1 View  Result Date: 06/01/2022 CLINICAL DATA:  Dizziness for 3 days with shortness of breath. EXAM: CHEST  1 VIEW COMPARISON:  Radiographs 02/04/2019 and 01/16/2019.  CT 01/10/2018. FINDINGS: 1345 hours. The heart size and mediastinal contours are stable. Asymmetric bullous emphysematous changes at the right lung apex are again noted. There is no edema, confluent airspace opacity, pleural effusion or pneumothorax. No acute osseous findings are evident. There is an old fracture of the mid right clavicle. IMPRESSION: No evidence of acute cardiopulmonary process. Chronic obstructive pulmonary disease. Electronically Signed   By: Richardean Sale M.D.   On: 06/01/2022 13:52    Labs:  Basic Metabolic Panel: Recent Labs  Lab 06/06/22 0610 06/08/22 0513  NA 140 140  K 4.0 3.9  CL 108 107  CO2  23 24  GLUCOSE 107* 105*  BUN 9 13  CREATININE 1.18 1.04  CALCIUM 9.1 9.5  MG 1.7  --     CBC: Recent Labs  Lab 06/06/22 0610 06/08/22 0513  WBC 7.1 6.4  NEUTROABS 2.6  --   HGB 13.2 13.6  HCT 40.4 41.5  MCV 90.6 89.1  PLT 233 250    CBG: No results for input(s): "GLUCAP" in the last 168 hours.  Brief HPI:   Timothy Galvan is a 66 y.o. male ***   Hospital Course: Timothy Galvan was admitted to rehab 06/05/2022 for inpatient therapies to consist of PT, ST and OT at least three hours five days a week. Past  admission physiatrist, therapy team and rehab RN have worked together to provide customized collaborative inpatient rehab.   Blood pressures were monitored on TID basis and   Diabetes has been monitored with ac/hs CBG checks and SSI was use prn for tighter BS control.    Rehab course: During patient's stay in rehab weekly team conferences were held to monitor patient's progress, set goals and discuss barriers to discharge. At admission, patient required  He/She  has had improvement in activity tolerance, balance, postural control as well as ability to compensate for deficits. He/She has had improvement in functional use RUE/LUE  and RLE/LLE as well as improvement in awareness       Disposition:  There are no questions and answers to display.         Diet:  Special Instructions:  Discharge Instructions     Ambulatory referral to Neurology   Complete by: As directed    An appointment is requested in approximately: 3-4 weeks   Ambulatory referral to Physical Medicine Rehab   Complete by: As directed    Hospital follow up      Allergies as of 06/12/2022       Reactions   Penicillins    Has patient had a PCN reaction causing immediate rash, facial/tongue/throat swelling, SOB or lightheadedness with hypotension: Unknown Has patient had a PCN reaction causing severe rash involving mucus membranes or skin necrosis: Unknown Has patient had a PCN reaction that required hospitalization: Unknown Has patient had a PCN reaction occurring within the last 10 years: Unknown If all of the above answers are "NO", then may proceed with Cephalosporin use.        Medication List     STOP taking these medications    carvedilol 6.25 MG tablet Commonly known as: COREG   isosorbide mononitrate 30 MG 24 hr tablet Commonly known as: IMDUR       TAKE these medications    amLODipine 2.5 MG tablet Commonly known as: NORVASC Take 1 tablet (2.5 mg total) by mouth daily. What  changed:  medication strength how much to take   aspirin EC 81 MG tablet Take 1 tablet (81 mg total) by mouth at bedtime.   atorvastatin 40 MG tablet Commonly known as: LIPITOR Take 1 tablet (40 mg total) by mouth at bedtime.   clopidogrel 75 MG tablet Commonly known as: PLAVIX Take 1 tablet (75 mg total) by mouth daily.   folic acid 1 MG tablet Commonly known as: FOLVITE Take 1 tablet (1 mg total) by mouth daily.   gabapentin 300 MG capsule Commonly known as: NEURONTIN Take 2 capsules (600 mg total) by mouth 3 (three) times daily.   hydrALAZINE 25 MG tablet Commonly known as: APRESOLINE Take 1 tablet (25 mg total) by mouth every 8 (  eight) hours.   Ipratropium-Albuterol 20-100 MCG/ACT Aers respimat Commonly known as: COMBIVENT Inhale 1 puff into the lungs every 6 (six) hours as needed for wheezing.   l-methylfolate-B6-B12 3-35-2 MG Tabs tablet Commonly known as: METANX Take 1 tablet by mouth 2 (two) times daily.   latanoprost 0.005 % ophthalmic solution Commonly known as: XALATAN Place 1 drop into the right eye at bedtime.   losartan 50 MG tablet Commonly known as: COZAAR Take 1 tablet (50 mg total) by mouth daily. What changed:  medication strength how much to take   magnesium oxide 400 MG tablet Commonly known as: MAG-OX Take 1 tablet (400 mg total) by mouth at bedtime.   melatonin 3 MG Tabs tablet Take 1 tablet (3 mg total) by mouth at bedtime. What changed:  medication strength how much to take   multivitamin with minerals Tabs tablet Take 1 tablet by mouth daily.   nicotine 21 mg/24hr patch Commonly known as: NICODERM CQ - dosed in mg/24 hours Place 1 patch (21 mg total) onto the skin daily.   nitroGLYCERIN 0.4 MG SL tablet Commonly known as: NITROSTAT Place 1 tablet (0.4 mg total) under the tongue every 5 (five) minutes as needed for chest pain.   pantoprazole 40 MG tablet Commonly known as: PROTONIX Take 1 tablet (40 mg total) by mouth  daily.   thiamine 100 MG tablet Commonly known as: VITAMIN B1 Take 1 tablet (100 mg total) by mouth daily.   umeclidinium bromide 62.5 MCG/ACT Aepb Commonly known as: INCRUSE ELLIPTA Inhale 1 puff into the lungs daily.   Vitamin D (Ergocalciferol) 1.25 MG (50000 UNIT) Caps capsule Commonly known as: DRISDOL Take 1 capsule (50,000 Units total) by mouth every 7 (seven) days. Start taking on: June 15, 2022        [place], [employer]. Call on 06/15/2022.   Why: for post hospital follow up Contact information: [place] [address] [phone]         Izora Ribas, MD Follow up.   Specialty: Physical Medicine and Rehabilitation Why: office will call you with follow up appointment Contact information: [address] [place] [address] [phone]         Pedro Earls, MD Follow up.   Specialties: Radiology, Interventional Radiology Why: office will call you with follow up appointment Contact information: [place] [address] [phone]                 Signed: [name] 06/12/2022, 10:15 AM

## 2022-06-15 DIAGNOSIS — I10 Essential (primary) hypertension: Secondary | ICD-10-CM | POA: Insufficient documentation

## 2022-06-16 ENCOUNTER — Ambulatory Visit: Payer: Medicare Other | Attending: Cardiology

## 2022-06-17 ENCOUNTER — Other Ambulatory Visit (HOSPITAL_COMMUNITY): Payer: Self-pay

## 2022-06-19 ENCOUNTER — Other Ambulatory Visit (HOSPITAL_COMMUNITY): Payer: Self-pay

## 2022-06-24 ENCOUNTER — Other Ambulatory Visit: Payer: Self-pay | Admitting: Physical Medicine and Rehabilitation

## 2022-06-24 ENCOUNTER — Telehealth: Payer: Self-pay

## 2022-06-24 MED ORDER — NICOTINE 21 MG/24HR TD PT24: 21.0000 mg | MEDICATED_PATCH | Freq: Every day | TRANSDERMAL | 0 refills | Status: DC

## 2022-06-24 NOTE — Telephone Encounter (Signed)
Timothy Galvan  (ph [phone]) with [facility] called:  Mr. Coad stated he maybe moving. If so, [facility] may have to d/c his orders. Patient has been informed.   Also he is missing the Nicotine patches, Vit B and Latanoprost  eye drops.   Verbal ok given for Galvan once a week for 6 weeks.

## 2022-06-25 NOTE — Telephone Encounter (Signed)
[name] RN has been informed. Timothy Galvan will be getting his in-home health services from [facility] in [place].

## 2022-06-26 ENCOUNTER — Other Ambulatory Visit (HOSPITAL_COMMUNITY): Payer: Self-pay

## 2022-07-02 ENCOUNTER — Telehealth: Payer: Self-pay

## 2022-07-02 NOTE — Telephone Encounter (Signed)
Timothy Galvan from [facility] just wanted to inform us that his [facility] will resume on 07/04/22 with the [place] office.

## 2022-07-08 ENCOUNTER — Telehealth: Payer: Self-pay

## 2022-07-08 NOTE — Telephone Encounter (Signed)
Melissa, ST from [facility] called requesting verbal orders for HHST 1wk4.

## 2022-07-09 ENCOUNTER — Other Ambulatory Visit (HOSPITAL_COMMUNITY): Payer: Self-pay | Admitting: Neuroradiology

## 2022-07-09 DIAGNOSIS — I771 Stricture of artery: Secondary | ICD-10-CM

## 2022-07-10 ENCOUNTER — Telehealth: Payer: Self-pay

## 2022-07-10 NOTE — Telephone Encounter (Signed)
Received fax from [employer]- 30 Day Monitor- reporting critical event for pt. Auto Trigger- Sinus Tachycardia, SVT (>10-34 seconds).   Will fwd to provider and have report scanned as well.

## 2022-07-15 NOTE — Telephone Encounter (Signed)
Called Melissa back orders approved and given for ST

## 2022-07-17 ENCOUNTER — Telehealth: Payer: Self-pay | Admitting: Physical Medicine and Rehabilitation

## 2022-07-17 NOTE — Telephone Encounter (Signed)
Melissa ST with [facility] needed to let Dr. Ranell Patrick know that patients Pulse was out of parameter 102 and also is needing something for hip pain.  Please call her at [phone].

## 2022-07-21 ENCOUNTER — Encounter: Payer: 59 | Attending: Physical Medicine and Rehabilitation | Admitting: Physical Medicine and Rehabilitation

## 2022-07-21 DIAGNOSIS — I159 Secondary hypertension, unspecified: Secondary | ICD-10-CM | POA: Insufficient documentation

## 2022-07-21 DIAGNOSIS — I639 Cerebral infarction, unspecified: Secondary | ICD-10-CM | POA: Insufficient documentation

## 2022-07-21 NOTE — Progress Notes (Signed)
Called patient to address concerns from home therapy but there was no response

## 2022-07-22 ENCOUNTER — Ambulatory Visit (HOSPITAL_COMMUNITY)
Admission: RE | Admit: 2022-07-22 | Discharge: 2022-07-22 | Disposition: A | Discharge status: Home / Self Care | Payer: 59 | Source: Ambulatory Visit | Attending: Neuroradiology | Admitting: Neuroradiology

## 2022-07-22 ENCOUNTER — Other Ambulatory Visit (HOSPITAL_COMMUNITY): Payer: Self-pay | Admitting: Neuroradiology

## 2022-07-22 DIAGNOSIS — I771 Stricture of artery: Secondary | ICD-10-CM

## 2022-07-22 NOTE — Consult Note (Signed)
Chief Complaint: Patient was seen in consultation today for left carotid stenosis.  Referring Physician(s): Su Monks, MD  Supervising Physician: Pedro Earls  Patient Status: [name] - Out-pt  History of Present Illness: Timothy Galvan is a 67 y.o. male with multiple recent hospital admission for syncope and dizziness since August 2023. 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 On 06/01/2022, he presented to emergency with complaints of dizziness and garbled speech for 3 days. MRI of the brain was remarkable for a left lenticulo capsular and right pontine infarcts. CT angiogram of the neck was unchanged.  His past medical history is significant for CAD, GERD, HLD, asthma, HTN, polysubstance abuse and PAD with fem-fem bypass and right iliac stenting.   Given high grade stenosis of the intracranial left ICA in the setting of occluded right ICA, he was referred to [place] to discuss endovascular options.   He presented today accompanied by his professional patient advocate. He is in a wheelchair due to PAD related claudication.   Past Medical History:  Diagnosis Date   Asthma    Avascular necrosis of bone of hip ([name])    Blood clot in vein    Cancer ([name])    Coronary artery disease    GERD (gastroesophageal reflux disease)    Heart disease    Hypertension    Pneumonia     Past Surgical History:  Procedure Laterality Date   FEMORAL BYPASS     MANDIBLE SURGERY     WRIST SURGERY      Allergies: Penicillins  Medications: Prior to Admission medications   Medication Sig Start Date End Date Taking? Authorizing Provider  amLODipine (NORVASC) 2.5 MG tablet Take 1 tablet (2.5 mg total) by mouth daily. 06/12/22   Love, [name], PA-C  aspirin EC 81 MG tablet Take 1 tablet (81 mg total) by mouth daily. Swallow whole. 06/13/22   Love, [name], PA-C  atorvastatin (LIPITOR) 40 MG tablet Take 1 tablet (40 mg  total) by mouth at bedtime. 06/11/22   Love, [name], PA-C  clopidogrel (PLAVIX) 75 MG tablet Take 1 tablet (75 mg total) by mouth daily. 06/11/22   Love, [name], PA-C  folic acid (FOLVITE) 1 MG tablet Take 1 tablet (1 mg total) by mouth daily. 06/06/22   Johnson, [name], MD  gabapentin (NEURONTIN) 300 MG capsule Take 2 capsules (600 mg total) by mouth 3 (three) times daily. 06/11/22   Love, [name], PA-C  hydrALAZINE (APRESOLINE) 25 MG tablet Take 1 tablet (25 mg total) by mouth every 8 (eight) hours. 06/11/22   Love, [name], PA-C  Ipratropium-Albuterol (COMBIVENT) 20-100 MCG/ACT AERS respimat Inhale 1 puff into the lungs every 6 (six) hours as needed for wheezing.    [provider]  l-methylfolate-B6-B12 (METANX) 3-35-2 MG TABS tablet Take 1 tablet by mouth 2 (two) times daily. 06/11/22   Love, [name], PA-C  latanoprost (XALATAN) 0.005 % ophthalmic solution Place 1 drop into the right eye at bedtime. 01/09/19   [provider]  losartan (COZAAR) 50 MG tablet Take 1 tablet (50 mg total) by mouth daily. 06/12/22   Love, [name], PA-C  magnesium oxide (MAG-OX) 400 MG tablet Take 1 tablet (400 mg total) by mouth at bedtime. 06/11/22   Love, [name], PA-C  melatonin 3 MG TABS tablet Take 1 tablet (3 mg total) by mouth at bedtime. 06/11/22   [name], PA-C  Multiple Vitamin (MULTIVITAMIN WITH MINERALS) TABS tablet Take 1 tablet by mouth daily. 06/12/22   Love, [name], PA-C  nicotine (NICODERM CQ - DOSED IN MG/24 HOURS) 21 mg/24hr patch Place 1 patch (21 mg total) onto the skin daily. 06/24/22   [name], [name], MD  nitroGLYCERIN (NITROSTAT) 0.4 MG SL tablet Place 1 tablet (0.4 mg total) under the tongue every 5 (five) minutes as needed for chest pain. 02/04/19   [name], MD  pantoprazole (PROTONIX) 40 MG tablet Take 1 tablet (40 mg total) by mouth daily. 06/12/22   Love, [name], PA-C  thiamine (VITAMIN B1) 100 MG tablet Take 1 tablet (100 mg total) by  mouth daily. 06/12/22   Love, [name], PA-C  umeclidinium bromide (INCRUSE ELLIPTA) 62.5 MCG/ACT AEPB Inhale 1 puff into the lungs daily. 06/12/22   Love, [name], PA-C  Vitamin D, Ergocalciferol, (DRISDOL) 1.25 MG (50000 UNIT) CAPS capsule Take 1 capsule (50,000 Units total) by mouth every 7 (seven) days. 06/15/22   LoveIvan Anchors, PA-C     No family history on file.  Social History   Socioeconomic History   Marital status: Single    Spouse name: Not on file   Number of children: Not on file   Years of education: Not on file   Highest education level: Not on file  Occupational History   Not on file  Tobacco Use   Smoking status: Every Day    Packs/day: 1.00    Types: Cigarettes   Smokeless tobacco: Never  Substance and Sexual Activity   Alcohol use: Yes    Alcohol/week: 12.0 standard drinks of alcohol    Types: 12 Cans of beer per week   Drug use: Not Currently    Types: Cocaine   Sexual activity: Not on file  Other Topics Concern   Not on file  Social History Narrative   Not on file   Social Determinants of Health   Financial Resource Strain: Not on file  Food Insecurity: No Food Insecurity (06/04/2022)   Hunger Vital Sign    Worried About Running Out of Food in the Last Year: Never true    Ran Out of Food in the Last Year: Never true  Transportation Needs: Not on file  Physical Activity: Not on file  Stress: Not on file  Social Connections: Not on file     Review of Systems: A 12 point ROS discussed and pertinent positives are indicated in the HPI above.  All other systems are negative.  Review of Systems  Vital Signs: There were no vitals taken for this visit.  Physical Exam HENT:     Head: Normocephalic and atraumatic.     Mouth/Throat:     Mouth: Mucous membranes are moist.     Pharynx: Oropharynx is clear.  Eyes:     Extraocular Movements: Extraocular movements intact.     Conjunctiva/sclera: Conjunctivae normal.     Pupils: Pupils are equal,  round, and reactive to light.  Pulmonary:     Effort: Pulmonary effort is normal.  Neurological:     Mental Status: He is alert and oriented to person, place, and time.     Cranial Nerves: Dysarthria present.     Sensory: Sensation is intact.     Motor: Weakness present.     Comments: Moderate dysarthria. Mild LLE weakness.          Imaging:  CT/CTA head and neck:  IMPRESSION: 1. Occlusion of the right internal carotid  artery at its origin and along its entire course. 2. Severe narrowing of the distal cavernous segment of the left internal carotid artery. 3. Right middle and anterior cerebral arteries are supplied by collateral flow from the circle of Willis. 4. Aortic Atherosclerosis (ICD10-I70.0) and Emphysema (ICD10-J43.9).     Electronically Signed   By: Ulyses Jarred M.D.   On: 03/07/2022 22:46  MRI/MRA brain:  IMPRESSION: 1. 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 2. 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 3. 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 4. 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 5. Focal moderate to severe stenosis of the distal right M1 segment and short-segment severe stenosis of the left P2/P3 segment.     Electronically Signed   By: Valetta Mole M.D.   On: 06/01/2022 16:10  Labs:  CBC: Recent Labs    03/07/22 1640 06/01/22 1431 06/06/22 0610 06/08/22 0513  WBC 9.4 6.1 7.1 6.4  HGB 14.9 13.0 13.2 13.6  HCT 46.0 41.9 40.4 41.5  PLT 313 242 233 250    COAGS: Recent Labs    03/07/22 1640 06/01/22 1431  INR 1.0 0.9  APTT 25 27    BMP: Recent Labs    06/04/22 0420 06/05/22 0423 06/06/22 0610 06/08/22 0513  NA 139 143 140 140  K 2.9* 3.6 4.0 3.9  CL 108 110 108 107   CO2 '26 25 23 24  '$ GLUCOSE 107* 100* 107* 105*  BUN '13 9 9 13  '$ CALCIUM 8.6* 9.3 9.1 9.5  CREATININE 1.02 1.02 1.18 1.04  GFRNONAA >60 >60 >60 >60    LIVER FUNCTION TESTS: Recent Labs    03/07/22 1640 06/01/22 1431 06/06/22 0610  BILITOT 1.4* 0.3 0.4  AST '16 24 25  '$ ALT '20 24 25  '$ ALKPHOS 67 103 67  PROT 7.1 6.9 5.9*  ALBUMIN 3.6 3.6 3.0*    TUMOR MARKERS: No results for input(s): "AFPTM", "CEA", "CA199", "CHROMGRNA" in the last 8760 hours.  Assessment and Plan:  I reviewed pertinent images and charts. I explained to Mr. Gwinner and his advocate that given the calcification in the intracranial left ICA, it is hard to precisely determine the degree of stenosis.  Additionally, since his right carotid is occluded and his right anterior circulation is supplied by the left ICA via anterior communicating artery, a stenosis of the left ICA could increase the risk of recurrent stroke for both hemispheres. I proposed performing a diagnostic cerebral angiogram for a more precise assessment of his cerebral vasculature. I explained risks, including but not limited to, stroke, vessel injury, infection and bleed. He understood and would like to proceed with the exam. Our schedulers will reach out to him to book the angiogram under moderate sedation.  Thank you for this interesting consult.  I greatly enjoyed meeting MASEN LUALLEN and look forward to participating in their care.  A copy of this report was sent to the requesting provider on this date.  Electronically Signed: Pedro Earls, MD 07/22/2022, 2:14 PM   I spent a total of  40 Minutes   in face to face in clinical consultation, greater than 50% of which was counseling/coordinating care for intracranial atherosclerotic disease.

## 2022-07-27 ENCOUNTER — Telehealth (HOSPITAL_COMMUNITY): Payer: Self-pay

## 2022-07-27 NOTE — Telephone Encounter (Signed)
Called [facility] [phone] to try and schedule angiogram, no answer, left vm. AW

## 2022-07-28 ENCOUNTER — Encounter (HOSPITAL_BASED_OUTPATIENT_CLINIC_OR_DEPARTMENT_OTHER): Payer: 59 | Admitting: Physical Medicine and Rehabilitation

## 2022-07-28 VITALS — BP 147/84 | HR 82 | Ht 63.0 in | Wt 147.0 lb

## 2022-07-28 DIAGNOSIS — I159 Secondary hypertension, unspecified: Secondary | ICD-10-CM

## 2022-07-28 DIAGNOSIS — I639 Cerebral infarction, unspecified: Secondary | ICD-10-CM | POA: Diagnosis present

## 2022-07-28 MED ORDER — GABAPENTIN 400 MG PO CAPS: 400.0000 mg | ORAL_CAPSULE | Freq: Three times a day (TID) | ORAL | 3 refills | Status: DC

## 2022-07-28 MED ORDER — NICOTINE 21 MG/24HR TD PT24: 21.0000 mg | MEDICATED_PATCH | Freq: Every day | TRANSDERMAL | 0 refills | Status: DC

## 2022-07-28 MED ORDER — AMLODIPINE BESYLATE 2.5 MG PO TABS: 2.5000 mg | ORAL_TABLET | Freq: Every day | ORAL | 0 refills | Status: DC

## 2022-07-28 NOTE — Progress Notes (Signed)
   Subjective:    Patient ID: Timothy Galvan, male    DOB: 1955/10/07, 67 y.o.   MRN: [id number]  HPI Timothy Galvan is a 67 year old man who presents for hospital follow-up after CVA  1) CVA -getting home therapy PT, OT, SLP -needs refills of some of his medications but is not sure which -he has neurology appointment scheduled  -taking his statin  2) HTN -he takes amlodipine  Review of Systems     Objective:   Physical Exam Gen: no distress, normal appearing HEENT: oral mucosa pink and moist, NCAT Cardio: Reg rate Chest: normal effort, normal rate of breathing Abd: soft, non-distended Ext: no edema Psych: pleasant, normal affect Skin: intact Neuro: Alert and oriented, right sided weakness, ambulates with the cane     Assessment & Plan:   1) CVA -continue therapies -would benefit from handicap placard to increase mobility in the community -reviewed all medications and provided necessary refills. -discussed vagal nerve stimulation if strength fails to improve in 6 months.  -encouraged walking outside every day  2) HTN: -BP is 147/84 today.  -Advised checking BP daily at home and logging results to bring into follow-up appointment with PCP and myself. -Reviewed BP meds today.  -Advised regarding healthy foods that can help lower blood pressure and provided with a list: 1) citrus foods- high in vitamins and minerals 2) salmon and other fatty fish - reduces inflammation and oxylipins 3) swiss chard (leafy green)- high level of nitrates 4) pumpkin seeds- one of the best natural sources of magnesium 5) Beans and lentils- high in fiber, magnesium, and potassium 6) Berries- high in flavonoids 7) Amaranth (whole grain, can be cooked similarly to rice and oats)- high in magnesium and fiber 8) Pistachios- even more effective at reducing BP than other nuts 9) Carrots- high in phenolic compounds that relax blood vessels and reduce inflammation 10) Celery- contain phthalides  that relax tissues of arterial walls 11) Tomatoes- can also improve cholesterol and reduce risk of heart disease 12) Broccoli- good source of magnesium, calcium, and potassium 13) Greek yogurt: high in potassium and calcium 14) Herbs and spices: Celery seed, cilantro, saffron, lemongrass, black cumin, ginseng, cinnamon, cardamom, sweet basil, and ginger 15) Chia and flax seeds- also help to lower cholesterol and blood sugar 16) Beets- high levels of nitrates that relax blood vessels  17) spinach and bananas- high in potassium  -Provided lise of supplements that can help with hypertension:  1) magnesium: one high quality brand is Bioptemizers since it contains all 7 types of magnesium, otherwise over the counter magnesium gluconate '400mg'$  is a good option 2) B vitamins 3) vitamin D 4) potassium 5) CoQ10 6) L-arginine 7) Vitamin C 8) Beetroot -Educated that goal BP is 120/80. -Made goal to incorporate some of the above foods into diet.

## 2022-08-17 ENCOUNTER — Ambulatory Visit: Payer: 59 | Admitting: Neurology

## 2022-08-17 ENCOUNTER — Encounter: Payer: Self-pay | Admitting: Neurology

## 2022-09-28 ENCOUNTER — Encounter: Payer: 59 | Attending: Physical Medicine and Rehabilitation | Admitting: Physical Medicine and Rehabilitation

## 2022-09-28 VITALS — BP 95/66 | Ht 63.0 in | Wt 141.0 lb

## 2022-09-28 DIAGNOSIS — I10 Essential (primary) hypertension: Secondary | ICD-10-CM | POA: Diagnosis not present

## 2022-09-28 DIAGNOSIS — W19XXXS Unspecified fall, sequela: Secondary | ICD-10-CM | POA: Insufficient documentation

## 2022-09-28 DIAGNOSIS — R471 Dysarthria and anarthria: Secondary | ICD-10-CM | POA: Diagnosis not present

## 2022-09-28 DIAGNOSIS — I639 Cerebral infarction, unspecified: Secondary | ICD-10-CM | POA: Diagnosis not present

## 2022-09-28 DIAGNOSIS — Z79899 Other long term (current) drug therapy: Secondary | ICD-10-CM | POA: Insufficient documentation

## 2022-09-28 DIAGNOSIS — I959 Hypotension, unspecified: Secondary | ICD-10-CM | POA: Insufficient documentation

## 2022-09-28 MED ORDER — BLOOD PRESSURE MONITOR & DTX KIT: 1.0000 | PACK | Freq: Every day | 3 refills | Status: DC

## 2022-09-28 MED ORDER — L-METHYLFOLATE-B6-B12 3-35-2 MG PO TABS: 1.0000 | ORAL_TABLET | Freq: Two times a day (BID) | ORAL | 0 refills | Status: DC

## 2022-09-28 MED ORDER — MELATONIN 5 MG PO CAPS: 1.0000 | ORAL_CAPSULE | Freq: Every day | ORAL | 3 refills | Status: DC

## 2022-09-28 NOTE — Addendum Note (Signed)
Addended by: Izora Ribas on: 09/28/2022 02:57 PM   Modules accepted: Orders

## 2022-09-28 NOTE — Progress Notes (Signed)
   Subjective:    Patient ID: Timothy Galvan, male    DOB: 1955-10-16, 67 y.o.   MRN: [id number]  HPI Mr. Timothy Galvan is a 67 year old man who presents for follow-up of CVA  1) CVA -getting home therapy PT, OT, SLP -needs refills of some of his medications but is not sure which -he has neurology appointment scheduled  -taking his statin  2) HTN -he takes amlodipine, Cozaar, Hydralazine -BP is 95/66  Review of Systems     Objective:   Physical Exam Gen: no distress, normal appearing HEENT: oral mucosa pink and moist, NCAT Cardio: Reg rate Chest: normal effort, normal rate of breathing Abd: soft, non-distended Ext: no edema Psych: pleasant, normal affect Skin: intact Neuro: Alert and oriented, right sided weakness, ambulates with the cane     Assessment & Plan:   1) CVA -continue HEP -would benefit from handicap placard to increase mobility in the community -reviewed all medications and provided necessary refills. -discussed vagal nerve stimulation if strength fails to improve in 6 months.  -encouraged walking outside every day -discussed that it is better to take a methylated multivitamin  2) HTN: -BP is 90s/50s-60s -has cardiology follow-up.  -stop amlodipine -Advised checking BP daily at home and logging results to bring into follow-up appointment with PCP and myself. -Reviewed BP meds today.  -Advised regarding healthy foods that can help lower blood pressure and provided with a list: 1) citrus foods- high in vitamins and minerals 2) salmon and other fatty fish - reduces inflammation and oxylipins 3) swiss chard (leafy green)- high level of nitrates 4) pumpkin seeds- one of the best natural sources of magnesium 5) Beans and lentils- high in fiber, magnesium, and potassium 6) Berries- high in flavonoids 7) Amaranth (whole grain, can be cooked similarly to rice and oats)- high in magnesium and fiber 8) Pistachios- even more effective at reducing BP than other  nuts 9) Carrots- high in phenolic compounds that relax blood vessels and reduce inflammation 10) Celery- contain phthalides that relax tissues of arterial walls 11) Tomatoes- can also improve cholesterol and reduce risk of heart disease 12) Broccoli- good source of magnesium, calcium, and potassium 13) Greek yogurt: high in potassium and calcium 14) Herbs and spices: Celery seed, cilantro, saffron, lemongrass, black cumin, ginseng, cinnamon, cardamom, sweet basil, and ginger 15) Chia and flax seeds- also help to lower cholesterol and blood sugar 16) Beets- high levels of nitrates that relax blood vessels  17) spinach and bananas- high in potassium  -Provided lise of supplements that can help with hypertension:  1) magnesium: one high quality brand is Bioptemizers since it contains all 7 types of magnesium, otherwise over the counter magnesium gluconate 400mg  is a good option 2) B vitamins 3) vitamin D 4) potassium 5) CoQ10 6) L-arginine 7) Vitamin C 8) Beetroot -Educated that goal BP is 120/80. -Made goal to incorporate some of the above foods into diet.    3) Fall -discussed that he did not suffer any injuries  4) Dysarthria: -SLP ordered

## 2022-11-09 ENCOUNTER — Encounter: Payer: 59 | Attending: Physical Medicine and Rehabilitation | Admitting: Physical Medicine and Rehabilitation

## 2022-11-09 DIAGNOSIS — Z79899 Other long term (current) drug therapy: Secondary | ICD-10-CM | POA: Insufficient documentation

## 2022-11-09 DIAGNOSIS — R471 Dysarthria and anarthria: Secondary | ICD-10-CM | POA: Insufficient documentation

## 2022-11-09 DIAGNOSIS — I959 Hypotension, unspecified: Secondary | ICD-10-CM | POA: Insufficient documentation

## 2022-11-09 DIAGNOSIS — I639 Cerebral infarction, unspecified: Secondary | ICD-10-CM | POA: Insufficient documentation

## 2022-11-09 DIAGNOSIS — I10 Essential (primary) hypertension: Secondary | ICD-10-CM | POA: Insufficient documentation

## 2022-11-27 ENCOUNTER — Encounter: Payer: 59 | Attending: Physical Medicine and Rehabilitation | Admitting: Physical Medicine and Rehabilitation

## 2022-11-27 VITALS — BP 78/51 | HR 66 | Ht 63.0 in | Wt 142.0 lb

## 2022-11-27 DIAGNOSIS — I639 Cerebral infarction, unspecified: Secondary | ICD-10-CM

## 2022-11-27 DIAGNOSIS — I159 Secondary hypertension, unspecified: Secondary | ICD-10-CM | POA: Diagnosis present

## 2022-11-27 MED ORDER — LOSARTAN POTASSIUM 25 MG PO TABS: 25.0000 mg | ORAL_TABLET | Freq: Every day | ORAL | 3 refills | Status: DC

## 2022-11-27 MED ORDER — GABAPENTIN 400 MG PO CAPS: 400.0000 mg | ORAL_CAPSULE | Freq: Three times a day (TID) | ORAL | 3 refills | Status: DC

## 2022-11-27 NOTE — Progress Notes (Signed)
   Subjective:    Patient ID: Timothy Galvan, male    DOB: Nov 06, 1955, 67 y.o.   MRN: [id number]  HPI Mr. Santellan is a 67 year old man who presents for follow-up of CVA  1) CVA -completed his home therapy -needs refills of some of his medications but is not sure which -he has neurology appointment scheduled  -taking his statin -balance is off  2) HTN -he takes amlodipine, Cozaar, Hydralazine -BP is 78/51  Review of Systems  Musculoskeletal:  Positive for back pain.       B/L leg hip pain  All other systems reviewed and are negative.     Objective:   Physical Exam Gen: no distress, normal appearing HEENT: oral mucosa pink and moist, NCAT Cardio: Reg rate Chest: normal effort, normal rate of breathing Abd: soft, non-distended Ext: no edema Psych: pleasant, normal affect Skin: intact Neuro: Alert and oriented, right sided weakness, ambulates with the cane     Assessment & Plan:   1) CVA -continue HEP -prescribed physical therapy for balance -would benefit from handicap placard to increase mobility in the community -reviewed all medications and provided necessary refills. -discussed vagal nerve stimulation if strength fails to improve in 6 months.  -encouraged walking outside every day -discussed that it is better to take a methylated multivitamin  2) HTN: -discussed that SBP is 70s, currently asymptomatic, disucssed that he is taking too much blood pressure medication as at times he feels dizzy -has cardiology follow-up.  -stop amlodipine -decreased cozaar from 50mg  to 25mg  daily -Advised checking BP daily at home and logging results to bring into follow-up appointment with PCP and myself. -Reviewed BP meds today.  -Advised regarding healthy foods that can help lower blood pressure and provided with a list: 1) citrus foods- high in vitamins and minerals 2) salmon and other fatty fish - reduces inflammation and oxylipins 3) swiss chard (leafy green)- high level  of nitrates 4) pumpkin seeds- one of the best natural sources of magnesium 5) Beans and lentils- high in fiber, magnesium, and potassium 6) Berries- high in flavonoids 7) Amaranth (whole grain, can be cooked similarly to rice and oats)- high in magnesium and fiber 8) Pistachios- even more effective at reducing BP than other nuts 9) Carrots- high in phenolic compounds that relax blood vessels and reduce inflammation 10) Celery- contain phthalides that relax tissues of arterial walls 11) Tomatoes- can also improve cholesterol and reduce risk of heart disease 12) Broccoli- good source of magnesium, calcium, and potassium 13) Greek yogurt: high in potassium and calcium 14) Herbs and spices: Celery seed, cilantro, saffron, lemongrass, black cumin, ginseng, cinnamon, cardamom, sweet basil, and ginger 15) Chia and flax seeds- also help to lower cholesterol and blood sugar 16) Beets- high levels of nitrates that relax blood vessels  17) spinach and bananas- high in potassium  -Provided lise of supplements that can help with hypertension:  1) magnesium: one high quality brand is Bioptemizers since it contains all 7 types of magnesium, otherwise over the counter magnesium gluconate 400mg  is a good option 2) B vitamins 3) vitamin D 4) potassium 5) CoQ10 6) L-arginine 7) Vitamin C 8) Beetroot -Educated that goal BP is 120/80. -Made goal to incorporate some of the above foods into diet.      3) Fall -discussed that he did not suffer any injuries  4) Dysarthria: -SLP ordered

## 2022-11-27 NOTE — Patient Instructions (Signed)
HTN: Advised checking BP daily at home and logging results to bring into follow-up appointment with PCP and myself. -Reviewed BP meds today.  -Advised regarding healthy foods that can help lower blood pressure and provided with a list: 1) citrus foods- high in vitamins and minerals 2) salmon and other fatty fish - reduces inflammation and oxylipins 3) swiss chard (leafy green)- high level of nitrates 4) pumpkin seeds- one of the best natural sources of magnesium 5) Beans and lentils- high in fiber, magnesium, and potassium 6) Berries- high in flavonoids 7) Amaranth (whole grain, can be cooked similarly to rice and oats)- high in magnesium and fiber 8) Pistachios- even more effective at reducing BP than other nuts 9) Carrots- high in phenolic compounds that relax blood vessels and reduce inflammation 10) Celery- contain phthalides that relax tissues of arterial walls 11) Tomatoes- can also improve cholesterol and reduce risk of heart disease 12) Broccoli- good source of magnesium, calcium, and potassium 13) Greek yogurt: high in potassium and calcium 14) Herbs and spices: Celery seed, cilantro, saffron, lemongrass, black cumin, ginseng, cinnamon, cardamom, sweet basil, and ginger 15) Chia and flax seeds- also help to lower cholesterol and blood sugar 16) Beets- high levels of nitrates that relax blood vessels  17) spinach and bananas- high in potassium  -Provided lise of supplements that can help with hypertension:  1) magnesium: one high quality brand is Bioptemizers since it contains all 7 types of magnesium, otherwise over the counter magnesium gluconate 400mg is a good option 2) B vitamins 3) vitamin D 4) potassium 5) CoQ10 6) L-arginine 7) Vitamin C 8) Beetroot -Educated that goal BP is 120/80. -Made goal to incorporate some of the above foods into diet.   

## 2022-11-29 ENCOUNTER — Emergency Department (HOSPITAL_COMMUNITY): Payer: 59

## 2022-11-29 ENCOUNTER — Emergency Department (HOSPITAL_COMMUNITY)
Admission: EM | Admit: 2022-11-29 | Discharge: 2022-11-29 | Disposition: A | Discharge status: Home / Self Care | Payer: 59 | Attending: Emergency Medicine | Admitting: Emergency Medicine

## 2022-11-29 ENCOUNTER — Other Ambulatory Visit: Payer: Self-pay

## 2022-11-29 DIAGNOSIS — I10 Essential (primary) hypertension: Secondary | ICD-10-CM | POA: Insufficient documentation

## 2022-11-29 DIAGNOSIS — Z7982 Long term (current) use of aspirin: Secondary | ICD-10-CM | POA: Insufficient documentation

## 2022-11-29 DIAGNOSIS — R42 Dizziness and giddiness: Secondary | ICD-10-CM | POA: Diagnosis present

## 2022-11-29 DIAGNOSIS — E860 Dehydration: Secondary | ICD-10-CM | POA: Insufficient documentation

## 2022-11-29 DIAGNOSIS — Z79899 Other long term (current) drug therapy: Secondary | ICD-10-CM | POA: Diagnosis not present

## 2022-11-29 DIAGNOSIS — Z85038 Personal history of other malignant neoplasm of large intestine: Secondary | ICD-10-CM | POA: Diagnosis not present

## 2022-11-29 LAB — BASIC METABOLIC PANEL
Anion gap: 8 (ref 5–15)
BUN: 28 mg/dL — ABNORMAL HIGH (ref 8–23)
CO2: 20 mmol/L — ABNORMAL LOW (ref 22–32)
Calcium: 8.9 mg/dL (ref 8.9–10.3)
Chloride: 112 mmol/L — ABNORMAL HIGH (ref 98–111)
Creatinine, Ser: 1.36 mg/dL — ABNORMAL HIGH (ref 0.61–1.24)
GFR, Estimated: 57 mL/min — ABNORMAL LOW (ref >60)
Glucose, Bld: 106 mg/dL — ABNORMAL HIGH (ref 70–99)
Potassium: 4 mmol/L (ref 3.5–5.1)
Sodium: 140 mmol/L (ref 135–145)

## 2022-11-29 LAB — CBC
HCT: 44.4 % (ref 39.0–52.0)
Hemoglobin: 14.2 g/dL (ref 13.0–17.0)
MCH: 29.6 pg (ref 26.0–34.0)
MCHC: 32 g/dL (ref 30.0–36.0)
MCV: 92.5 fL (ref 80.0–100.0)
Platelets: 225 10*3/uL (ref 150–400)
RBC: 4.8 MIL/uL (ref 4.22–5.81)
RDW: 14.3 % (ref 11.5–15.5)
WBC: 6.3 10*3/uL (ref 4.0–10.5)
nRBC: 0 % (ref 0.0–0.2)

## 2022-11-29 LAB — URINALYSIS, MICROSCOPIC (REFLEX)
Bacteria, UA: NONE SEEN
RBC / HPF: NONE SEEN RBC/hpf (ref 0–5)
Squamous Epithelial / HPF: NONE SEEN /HPF (ref 0–5)

## 2022-11-29 LAB — URINALYSIS, ROUTINE W REFLEX MICROSCOPIC
Bilirubin Urine: NEGATIVE
Glucose, UA: >=500 mg/dL — AB
Hgb urine dipstick: NEGATIVE
Ketones, ur: NEGATIVE mg/dL
Leukocytes,Ua: NEGATIVE
Nitrite: NEGATIVE
Protein, ur: NEGATIVE mg/dL
Specific Gravity, Urine: 1.02 (ref 1.005–1.030)
pH: 6 (ref 5.0–8.0)

## 2022-11-29 LAB — TROPONIN I (HIGH SENSITIVITY)
Troponin I (High Sensitivity): 7 ng/L (ref <18)
Troponin I (High Sensitivity): 6 ng/L (ref <18)

## 2022-11-29 LAB — MAGNESIUM: Magnesium: 2.2 mg/dL (ref 1.7–2.4)

## 2022-11-29 LAB — CBG MONITORING, ED: Glucose-Capillary: 104 mg/dL — ABNORMAL HIGH (ref 70–99)

## 2022-11-29 MED ORDER — SODIUM CHLORIDE 0.9 % IV BOLUS
1000.0000 mL | Freq: Once | INTRAVENOUS | Status: AC
  Administered 2022-11-29: 1000 mL via INTRAVENOUS

## 2022-11-29 NOTE — ED Notes (Signed)
Nurse attempted all family members to attempt a ride for pt. Everyone said no.

## 2022-11-29 NOTE — ED Provider Notes (Signed)
[place] EMERGENCY DEPARTMENT AT [facility] Provider Note   CSN: [id number] Arrival date & time: 11/29/22  1212     History  Chief Complaint  Patient presents with   Dizziness    Timothy Galvan is a 67 y.o. male.  He has a history of hypertension, colon cancer and had a recent stroke back in November that left him with dysarthria and balance issues.  He went to rehab and now is at home.  He is complaining of feeling dizzy lightheaded and low blood pressure that is been going on for a few days.  On review of his prior medical record it looks like this has been an issue for a few months.  He said he has been eating and drinking okay.  No new weakness or numbness no headache no chest pain or shortness of breath.  The history is provided by the patient and the EMS personnel.  Dizziness Quality:  Lightheadedness Severity:  Moderate Onset quality:  Gradual Duration:  2 days Timing:  Intermittent Progression:  Unchanged Chronicity:  Recurrent Relieved by:  Nothing Worsened by:  Standing up Ineffective treatments:  None tried Associated symptoms: no chest pain, no headaches, no nausea, no shortness of breath, no syncope and no vomiting   Risk factors: hx of stroke        Home Medications Prior to Admission medications   Medication Sig Start Date End Date Taking? Authorizing Provider  aspirin EC 81 MG tablet Take 1 tablet (81 mg total) by mouth daily. Swallow whole. 06/13/22   Love, Evlyn Kanner, PA-C  atorvastatin (LIPITOR) 40 MG tablet Take 1 tablet (40 mg total) by mouth at bedtime. 06/11/22   Love, Evlyn Kanner, PA-C  Blood Pressure Monitoring (BLOOD PRESSURE MONITOR & DTX) KIT 1 each by Does not apply route daily. 09/28/22   [name], Drema Pry, MD  carvedilol (COREG) 6.25 MG tablet Take 6.25 mg by mouth 2 (two) times daily. 07/02/22   [provider]  clopidogrel (PLAVIX) 75 MG tablet Take 1 tablet (75 mg total) by mouth daily. 06/11/22   Love, Evlyn Kanner, PA-C   diclofenac Sodium (VOLTAREN) 1 % GEL SMARTSIG:4 Gram(s) Topical 4 Times Daily PRN 08/13/22   [provider]  gabapentin (NEURONTIN) 400 MG capsule Take 1 capsule (400 mg total) by mouth 3 (three) times daily. 11/27/22   [name], Drema Pry, MD  hydrALAZINE (APRESOLINE) 25 MG tablet Take 1 tablet (25 mg total) by mouth every 8 (eight) hours. 06/11/22   Love, Evlyn Kanner, PA-C  Ipratropium-Albuterol (COMBIVENT) 20-100 MCG/ACT AERS respimat Inhale 1 puff into the lungs every 6 (six) hours as needed for wheezing.    [provider]  isosorbide mononitrate (IMDUR) 120 MG 24 hr tablet Take 120 mg by mouth daily. 06/26/22   [provider]  l-methylfolate-B6-B12 (METANX) 3-35-2 MG TABS tablet Take 1 tablet by mouth 2 (two) times daily. 09/28/22   [name], Drema Pry, MD  latanoprost (XALATAN) 0.005 % ophthalmic solution Place 1 drop into the right eye at bedtime. 01/09/19   [provider]  losartan (COZAAR) 100 MG tablet Take 100 mg by mouth daily. 07/09/22   [provider]  losartan (COZAAR) 25 MG tablet Take 1 tablet (25 mg total) by mouth daily. 11/27/22   [name], Drema Pry, MD  magnesium oxide (MAG-OX) 400 MG tablet Take 1 tablet (400 mg total) by mouth at bedtime. 06/11/22   Love, Evlyn Kanner, PA-C  Melatonin 5 MG CAPS Take 1 capsule (5  mg total) by mouth daily. 09/28/22   [name], Drema Pry, MD  Multiple Vitamin (MULTIVITAMIN WITH MINERALS) TABS tablet Take 1 tablet by mouth daily. 06/12/22   Love, Evlyn Kanner, PA-C  nicotine (NICODERM CQ - DOSED IN MG/24 HOURS) 21 mg/24hr patch Place 1 patch (21 mg total) onto the skin daily. 07/28/22   [name], Drema Pry, MD  nicotine polacrilex (COMMIT) 4 MG lozenge SMARTSIG:1 Lozenge(s) By Mouth Every 1-2 Hours PRN 07/02/22   [provider]  nitroGLYCERIN (NITROSTAT) 0.4 MG SL tablet Place 1 tablet (0.4 mg total) under the tongue every 5 (five) minutes as needed for chest pain. 02/04/19   Jeanmarie Plant, MD  omeprazole  (PRILOSEC) 20 MG capsule Take 20 mg by mouth daily. 06/16/22   [provider]  pantoprazole (PROTONIX) 40 MG tablet Take 1 tablet (40 mg total) by mouth daily. 06/12/22   Love, Evlyn Kanner, PA-C  thiamine (VITAMIN B1) 100 MG tablet Take 1 tablet (100 mg total) by mouth daily. 06/12/22   Love, Evlyn Kanner, PA-C  umeclidinium bromide (INCRUSE ELLIPTA) 62.5 MCG/ACT AEPB Inhale 1 puff into the lungs daily. 06/12/22   Love, Evlyn Kanner, PA-C  Vitamin D, Ergocalciferol, (DRISDOL) 1.25 MG (50000 UNIT) CAPS capsule Take 1 capsule (50,000 Units total) by mouth every 7 (seven) days. 06/15/22   Love, Evlyn Kanner, PA-C      Allergies    Penicillins    Review of Systems   Review of Systems  Constitutional:  Negative for fever.  Respiratory:  Negative for shortness of breath.   Cardiovascular:  Negative for chest pain and syncope.  Gastrointestinal:  Negative for nausea and vomiting.  Neurological:  Positive for dizziness, speech difficulty and light-headedness. Negative for headaches.    Physical Exam Updated Vital Signs BP 96/68   Pulse 62   Temp (!) 96.6 F (35.9 C) (Axillary)   Resp (!) 21   Ht 5\' 3"  (1.6 m)   Wt 58.1 kg   SpO2 96%   BMI 22.67 kg/m  Physical Exam Vitals and nursing note reviewed.  Constitutional:      General: He is not in acute distress.    Appearance: Normal appearance. He is well-developed.  HENT:     Head: Normocephalic and atraumatic.  Eyes:     Conjunctiva/sclera: Conjunctivae normal.  Cardiovascular:     Rate and Rhythm: Normal rate and regular rhythm.     Heart sounds: No murmur heard. Pulmonary:     Effort: Pulmonary effort is normal. No respiratory distress.     Breath sounds: Normal breath sounds.  Abdominal:     Palpations: Abdomen is soft.     Tenderness: There is no abdominal tenderness. There is no guarding or rebound.  Musculoskeletal:        General: No deformity. Normal range of motion.     Cervical back: Neck supple.     Right lower leg: No  edema.     Left lower leg: No edema.  Skin:    General: Skin is warm and dry.     Capillary Refill: Capillary refill takes less than 2 seconds.  Neurological:     Mental Status: He is alert. Mental status is at baseline.     Motor: No weakness.     Comments: He has dysarthric speech and is difficult to understand.  His upper and lower extremity strength is 5 out of 5.     ED Results / Procedures / Treatments   Labs (all labs ordered are  listed, but only abnormal results are displayed) Labs Reviewed  BASIC METABOLIC PANEL - Abnormal; Notable for the following components:      Result Value   Chloride 112 (*)    CO2 20 (*)    Glucose, Bld 106 (*)    BUN 28 (*)    Creatinine, Ser 1.36 (*)    GFR, Estimated 57 (*)    All other components within normal limits  URINALYSIS, ROUTINE W REFLEX MICROSCOPIC - Abnormal; Notable for the following components:   Glucose, UA >=500 (*)    All other components within normal limits  CBG MONITORING, ED - Abnormal; Notable for the following components:   Glucose-Capillary 104 (*)    All other components within normal limits  URINE CULTURE  CBC  MAGNESIUM  URINALYSIS, MICROSCOPIC (REFLEX)  TROPONIN I (HIGH SENSITIVITY)  TROPONIN I (HIGH SENSITIVITY)    EKG EKG Interpretation  Date/Time:  Sunday Nov 29 2022 12:22:52 EDT Ventricular Rate:  62 PR Interval:  195 QRS Duration: 90 QT Interval:  448 QTC Calculation: 455 R Axis:   85 Text Interpretation: Sinus rhythm Anterior infarct, old Nonspecific T abnormalities, lateral leads ST depressions sl worse than prior 11/23 Confirmed by Meridee Score [phone]) on 11/29/2022 12:29:25 PM  Radiology DG Chest Port 1 View  Result Date: 11/29/2022 CLINICAL DATA:  Altered mental status. EXAM: PORTABLE CHEST 1 VIEW COMPARISON:  06/01/2022 radiograph and prior studies FINDINGS: The cardiomediastinal silhouette is unremarkable. Emphysema again noted with severe bullous changes in RIGHT apex. There is no  evidence of focal airspace disease, pulmonary edema, suspicious pulmonary nodule/mass, pleural effusion, or pneumothorax. No acute bony abnormalities are identified. IMPRESSION: Emphysema without evidence of acute cardiopulmonary disease. Electronically Signed   By: Harmon Pier M.D.   On: 11/29/2022 14:15   CT Head Wo Contrast  Result Date: 11/29/2022 CLINICAL DATA:  67 year old male with altered mental status. EXAM: CT HEAD WITHOUT CONTRAST TECHNIQUE: Contiguous axial images were obtained from the base of the skull through the vertex without intravenous contrast. RADIATION DOSE REDUCTION: This exam was performed according to the departmental dose-optimization program which includes automated exposure control, adjustment of the mA and/or kV according to patient size and/or use of iterative reconstruction technique. COMPARISON:  06/01/2022 CT, MR and prior studies FINDINGS: Brain: No evidence of acute infarction, hemorrhage, hydrocephalus, extra-axial collection or mass lesion/mass effect. Atrophy, chronic small-vessel white matter ischemic changes and remote RIGHT frontoparietal and LEFT basal ganglia infarcts noted. Vascular: Carotid atherosclerotic calcifications are noted. Skull: Normal. Negative for fracture or focal lesion. Sinuses/Orbits: No acute finding. Other: None. IMPRESSION: 1. No evidence of acute intracranial abnormality. 2. Atrophy, chronic small-vessel white matter ischemic changes and remote RIGHT frontoparietal and LEFT basal ganglia infarcts. Electronically Signed   By: Harmon Pier M.D.   On: 11/29/2022 14:12    Procedures Procedures    Medications Ordered in ED Medications  sodium chloride 0.9 % bolus 1,000 mL (0 mLs Intravenous Stopped 11/29/22 1546)    ED Course/ Medical Decision Making/ A&P Clinical Course as of 11/29/22 1718  Sun Nov 29, 2022  1430 Blood pressure up to 110.  Patient feels well he is hungry he wants to drink something.  His workup is showing maybe mild  worsening creatinine.  CT unremarkable for any acute findings.  Will p.o. trial patient.  Likely will need some modification of his blood pressure medication. [MB]    Clinical Course User Index [MB] Terrilee Files, MD  Medical Decision Making Amount and/or Complexity of Data Reviewed Labs: ordered. Radiology: ordered.   This patient complains of dizziness lightheadedness low blood pressure; this involves an extensive number of treatment Options and is a complaint that carries with it a high risk of complications and morbidity. The differential includes hypovolemia, dehydration, overmedication, arrhythmia, anemia, stroke  I ordered, reviewed and interpreted labs, which included CBC normal, chemistries with low bicarb elevated creatinine, urinalysis without signs of infection, troponins flat I ordered medication IV fluids and reviewed PMP when indicated. I ordered imaging studies which included chest x-ray and head CT and I independently    visualized and interpreted imaging which showed no acute findings Additional history obtained from EMS Previous records obtained and reviewed in epic including recent discharge summary Cardiac monitoring reviewed, sinus bradycardia Social determinants considered, ongoing tobacco use Critical Interventions: None  After the interventions stated above, I reevaluated the patient and found patient to be asymptomatic blood pressure improved. Admission and further testing considered, no indications for admission or further workup at this time.  Will further decrease his blood pressure medication and recommended close follow-up with PCP and cardiology.  Return instructions discussed.         Final Clinical Impression(s) / ED Diagnoses Final diagnoses:  Lightheadedness  Dehydration    Rx / DC Orders ED Discharge Orders     None         Terrilee Files, MD 11/29/22 1720

## 2022-11-29 NOTE — Discharge Instructions (Addendum)
You are seen in the emergency department for dizziness lightheadedness.  You had blood work EKG and a CAT scan of your head.  Your blood pressure was low.  This may be related to dehydration but you are also on a lot of medication.  Please stop your Cozaar (losartan).  Recheck your blood pressure at home and keep a record of this.  Contact your primary care doctor and cardiologist for close follow-up.  Return to the emergency department if any worsening or concerning symptoms.

## 2022-11-29 NOTE — ED Notes (Signed)
[facility] transport call to take pt home , Timothy Galvan

## 2022-11-29 NOTE — ED Triage Notes (Signed)
Pt arrived by [facility]. 0700 this morning pt stated he felt dizzy and lightheaded. Hx of stroke, Dysarthria is normal per family from previous stroke.His home BP monitor stated he was hypotensive. Normotensive with EMS. CBG 120 with EMS.

## 2022-11-29 NOTE — ED Notes (Signed)
Attempted to d/c pt and pt voiced he did not have a ride home. Pt stated [facility] told him the hospital would provide a way home. Pt states he walks with a cane at  home and lives alone.

## 2022-12-01 LAB — URINE CULTURE: Culture: <10000 — AB

## 2022-12-04 ENCOUNTER — Ambulatory Visit: Payer: 59 | Attending: Physical Medicine and Rehabilitation

## 2022-12-04 ENCOUNTER — Other Ambulatory Visit: Payer: Self-pay

## 2022-12-04 VITALS — BP 131/66

## 2022-12-04 DIAGNOSIS — R2689 Other abnormalities of gait and mobility: Secondary | ICD-10-CM | POA: Insufficient documentation

## 2022-12-04 DIAGNOSIS — Z9181 History of falling: Secondary | ICD-10-CM | POA: Diagnosis present

## 2022-12-04 DIAGNOSIS — M6281 Muscle weakness (generalized): Secondary | ICD-10-CM | POA: Diagnosis present

## 2022-12-04 DIAGNOSIS — I639 Cerebral infarction, unspecified: Secondary | ICD-10-CM | POA: Insufficient documentation

## 2022-12-04 NOTE — Therapy (Signed)
OUTPATIENT PHYSICAL THERAPY NEURO EVALUATION   Patient Name: Timothy Galvan MRN: [id number] DOB:[birth year], 67 y.o., male Today's Date: 12/04/2022   REFERRING PROVIDER: Dr. Carlis Abbott  END OF SESSION:  PT End of Session - 12/04/22 1035     Visit Number 1    Number of Visits 10    Date for PT Re-Evaluation 01/29/23    Authorization Type [employer]HC Medicare/Medicaid    PT Start Time 1030    PT Stop Time 1115    PT Time Calculation (min) 45 min    Equipment Utilized During Treatment Gait belt    Activity Tolerance Patient tolerated treatment well    Behavior During Therapy WFL for tasks assessed/performed             Past Medical History:  Diagnosis Date   Asthma    Avascular necrosis of bone of hip (HCC)    Blood clot in vein    Cancer (HCC)    Coronary artery disease    GERD (gastroesophageal reflux disease)    Heart disease    Hypertension    Pneumonia    Past Surgical History:  Procedure Laterality Date   FEMORAL BYPASS     MANDIBLE SURGERY     WRIST SURGERY     Patient Active Problem List   Diagnosis Date Noted   HTN (hypertension) 06/15/2022   Left pontine stroke (HCC) 06/05/2022   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) 06/05/2022   Stroke (cerebrum) (HCC) 06/01/2022   PAD (peripheral artery disease) (HCC) 06/01/2022   Colon cancer (HCC) 06/01/2022   COPD (chronic obstructive pulmonary disease) (HCC) 06/01/2022   Blood clot in vein 03/08/2022   Cancer (HCC) 03/08/2022   Chest pain 01/16/2019   GERD (gastroesophageal reflux disease) 01/10/2018   CAD (coronary artery disease) 01/10/2018   Closed compression fracture of body of L1 vertebra (HCC) 01/10/2018   HLD (hyperlipidemia) 01/10/2018    ONSET DATE: 06/02/23      REFERRING DIAG: I63.9 (ICD-10-CM) - Cerebrovascular accident (CVA), unspecified mechanism (HCC)  THERAPY DIAG:  Other abnormalities of gait and mobility  Muscle weakness (generalized)  Rationale for  Evaluation and Treatment: Rehabilitation  SUBJECTIVE:                                                                                                                                                                                             SUBJECTIVE STATEMENT: Pt had initial stroke in 05/2022. HE was admitted in the hospital.Pt reports he had two strokes in November. Pt had received inpatient rehab therapy. Pt had home therapy for 4 weeks. Pt didn't have  any outpatient therapy. Prior to stroke, patient was walking with cane. Pt reports of falling 4 times since the stroke at home. Patient reports of multiple near falls at home and reports of furniture surfing at home to walk around the house. Pt accompanied by: self  PERTINENT HISTORY: He has a history of hypertension, colon cancer and had a recent stroke back in November that left him with dysarthria and balance issues.  PAIN:  Are you having pain? No  PRECAUTIONS: Fall  WEIGHT BEARING RESTRICTIONS: No  FALLS: Has patient fallen in last 6 months? Yes. Number of falls 4 and multiple near falls daily  LIVING ENVIRONMENT: Lives with: lives alone Lives in: House/apartment Stairs: No Has following equipment at home: Single point cane, Environmental consultant - 2 wheeled, Environmental consultant - 4 wheeled, shower chair, and life alert  PLOF: Requires assistive device for independence  PATIENT GOALS: improve balance  OBJECTIVE:   COGNITION: Overall cognitive status: Within functional limits for tasks assessed    LOWER EXTREMITY ROM:     Active  Right Eval Left Eval  Hip flexion    Hip extension    Hip abduction    Hip adduction    Hip internal rotation    Hip external rotation    Knee flexion    Knee extension    Ankle dorsiflexion    Ankle plantarflexion    Ankle inversion    Ankle eversion     (Blank rows = not tested)  LOWER EXTREMITY MMT:    MMT Right Eval Left Eval  Hip flexion    Hip extension    Hip abduction    Hip adduction    Hip  internal rotation    Hip external rotation    Knee flexion    Knee extension    Ankle dorsiflexion    Ankle plantarflexion    Ankle inversion    Ankle eversion    (Blank rows = not tested)  BED MOBILITY:  Independent  TRANSFERS: Assistive device utilized: Single point cane  Sit to stand: SBA Stand to sit: SBA Chair to chair: SBA    GAIT: Gait pattern: decreased arm swing- Right, decreased arm swing- Left, decreased step length- Right, decreased step length- Left, Right foot flat, Left foot flat, ataxic, wide BOS, poor foot clearance- Right, and poor foot clearance- Left Distance walked: 115' Assistive device utilized: Environmental consultant - 2 wheeled Level of assistance: CGA Comments: Pt initially walked in gym with [place] cane. As he walked from gym to treatment room, he held the wall along the way for support. Assessment was done with RW as it was most appropriate device for patient's safety at this time. Patient was educated to avoid using cane at home and use RW instead.  FUNCTIONAL TESTS: (12/04/22) 5 times sit to stand: 14.75 sec without UE support Timed up and go (TUG): 24.22 sec with RW  10 meter walk test: 20.44 sec with RW- 0.49 m/s - risk for fall <0.50 m/s Berg Balance Scale: 36/56 high risk of fall  PATIENT SURVEYS:  FOTO 43/100 (12/04/22)  TODAY'S TREATMENT:  DATE: none   At the end of session, pt was walked to checkout with CGA with his cane. Pt was placed in wheelchair to avoid walking without cane and potentially fall, while he was waiting for his transportation.  PATIENT EDUCATION: Education details: Pt educated on not using st. Cane at home or in community. Pt educated on using RW for inside the house and community for improved safety. and reduce risk of fall. Gave patient BP log to monitor BP 3x/day. He reported his BP was 153/99 today. Pt was  educated that it is not safe for PT if his BP is >180/100. Educated pt on normal BP levels. Pt educated to communicate with his PCP if his BP is running high on consistent basis.  Person educated: Patient Education method: Explanation Education comprehension: verbalized understanding  HOME EXERCISE PROGRAM: TBD  GOALS: Goals reviewed with patient? Yes  SHORT TERM GOALS: Target date: 01/01/2023    Patient will be compliant with using RW for his in home and community ambulation to improve safety and reduce fall risk Baseline: Pt walked in with straight cane today (12/04/22) Goal status: INITIAL  2.  Patient will be able be safely go up and down ramp and negotiate curb with RW to improve ambulation and safety in community. Baseline: not attempted Goal status: INITIAL  3.  Pt will be demo 5x sit to stand without use of UE in <12 seconds to improve functional strength. Baseline: 14.75 sec without bil UE support (eval) Goal status: INITIAL   LONG TERM GOALS: Target date: 01/29/2023    Patient will demo berg balance scale score of >45/56 to reduce fall risk. Baseline: 36/56 (eval) Goal status: INITIAL  2.  Patient will demo gait speed of >0.81m/s with LRAD (hopefully cane) to improve functional ambulation and reduce fall risk Baseline: 0.49 m/s with RW (eval) Goal status: INITIAL  3.  Patient will demo TUG score of <16 sec with LRAD (hopefully cane) to improve functional mobility. Baseline: 24 sec with RW (eval) Goal status: INITIAL  4.  Patient will demo 10 points improvement on FOTO to improve overall function Baseline: FOTO 43 (eval) Goal status: INITIAL  5.  Pt will be I and compliant with HEP to self manage symptoms at home. Baseline: TBD Goal status: INITIAL   ASSESSMENT:  CLINICAL IMPRESSION: Patient is a 67 y.o. male who was seen today for physical therapy evaluation and treatment for gait and mobility disorder after stroke in 05/2022. Patient demonstrated  significant impairments with his gait and functional mobility and his static and dynamic balance. Patient is currently at moderate to high risk for fall based on previous history of falls and near falls and objective data from today session including (TUG, gait speed, Berg balance scale, 10 meter walk test). Patient will benefit from skilled PT to address his impairments and improve overall function.   OBJECTIVE IMPAIRMENTS: Abnormal gait, decreased activity tolerance, decreased balance, decreased coordination, decreased endurance, decreased mobility, difficulty walking, decreased strength, and postural dysfunction.   ACTIVITY LIMITATIONS: carrying, standing, squatting, stairs, transfers, and dressing  PARTICIPATION LIMITATIONS: meal prep, cleaning, laundry, shopping, and community activity  PERSONAL FACTORS: Age, Past/current experiences, Time since onset of injury/illness/exacerbation, and 1-2 comorbidities: hx of CVA, HTN  are also affecting patient's functional outcome.   REHAB POTENTIAL: Good  CLINICAL DECISION MAKING: Stable/uncomplicated  EVALUATION COMPLEXITY: Low  PLAN:  PT FREQUENCY: 2x/week  PT DURATION: 8 weeks  PLANNED INTERVENTIONS: Therapeutic exercises, Therapeutic activity, Neuromuscular re-education, Balance training, Gait training, Patient/Family  education, Self Care, Joint mobilization, Stair training, Orthotic/Fit training, Cryotherapy, Moist heat, Manual therapy, and Re-evaluation  PLAN FOR NEXT SESSION: Issue HEP, work on Company secretary with varying BOS and support surfaces, practice ramp/curb with RW, Did patient stop using cane at home?   Ileana Ladd, PT 12/04/2022, 11:45 AM

## 2022-12-08 ENCOUNTER — Other Ambulatory Visit: Payer: Self-pay | Admitting: Physical Medicine and Rehabilitation

## 2022-12-08 DIAGNOSIS — I639 Cerebral infarction, unspecified: Secondary | ICD-10-CM

## 2022-12-10 ENCOUNTER — Ambulatory Visit: Payer: 59 | Admitting: Physical Therapy

## 2022-12-10 VITALS — BP 154/80 | HR 62

## 2022-12-10 DIAGNOSIS — R2689 Other abnormalities of gait and mobility: Secondary | ICD-10-CM

## 2022-12-10 DIAGNOSIS — Z9181 History of falling: Secondary | ICD-10-CM

## 2022-12-10 DIAGNOSIS — M6281 Muscle weakness (generalized): Secondary | ICD-10-CM

## 2022-12-10 NOTE — Therapy (Signed)
OUTPATIENT PHYSICAL THERAPY NEURO TREATMENT   Patient Name: MAN MOM MRN: [id number] DOB:[birth year], 67 y.o., male Today's Date: 12/10/2022   REFERRING PROVIDER: Dr. Carlis Abbott  END OF SESSION:  PT End of Session - 12/10/22 1022     Visit Number 2    Number of Visits 10    Date for PT Re-Evaluation 01/29/23    Authorization Type UHC Medicare/Medicaid    PT Start Time 1020   pt arrived late   PT Stop Time 1100    PT Time Calculation (min) 40 min    Equipment Utilized During Treatment Gait belt    Activity Tolerance Patient tolerated treatment well    Behavior During Therapy WFL for tasks assessed/performed             Past Medical History:  Diagnosis Date   Asthma    Avascular necrosis of bone of hip (HCC)    Blood clot in vein    Cancer (HCC)    Coronary artery disease    GERD (gastroesophageal reflux disease)    Heart disease    Hypertension    Pneumonia    Past Surgical History:  Procedure Laterality Date   FEMORAL BYPASS     MANDIBLE SURGERY     WRIST SURGERY     Patient Active Problem List   Diagnosis Date Noted   HTN (hypertension) 06/15/2022   Left pontine stroke (HCC) 06/05/2022   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) 06/05/2022   Stroke (cerebrum) (HCC) 06/01/2022   PAD (peripheral artery disease) (HCC) 06/01/2022   Colon cancer (HCC) 06/01/2022   COPD (chronic obstructive pulmonary disease) (HCC) 06/01/2022   Blood clot in vein 03/08/2022   Cancer (HCC) 03/08/2022   Chest pain 01/16/2019   GERD (gastroesophageal reflux disease) 01/10/2018   CAD (coronary artery disease) 01/10/2018   Closed compression fracture of body of L1 vertebra (HCC) 01/10/2018   HLD (hyperlipidemia) 01/10/2018    ONSET DATE: 06/02/23      REFERRING DIAG: I63.9 (ICD-10-CM) - Cerebrovascular accident (CVA), unspecified mechanism (HCC)  THERAPY DIAG:  Other abnormalities of gait and mobility  Muscle weakness  (generalized)  History of falling  Rationale for Evaluation and Treatment: Rehabilitation  SUBJECTIVE:                                                                                                                                                                                             SUBJECTIVE STATEMENT: Pt ambulated into clinic w/SPC. States he left his RW on his porch a few days ago and someone stole it. Did not take his meds this  morning and did not eat breakfast. Denies falls but states he has had several near misses in his house.   Pt accompanied by: self  PERTINENT HISTORY: He has a history of hypertension, colon cancer and had a recent stroke back in November that left him with dysarthria and balance issues.  PAIN:  Are you having pain? No  PRECAUTIONS: Fall  WEIGHT BEARING RESTRICTIONS: No  FALLS: Has patient fallen in last 6 months? Yes. Number of falls 4 and multiple near falls daily  LIVING ENVIRONMENT: Lives with: lives alone Lives in: House/apartment Stairs: No Has following equipment at home: Single point cane, Environmental consultant - 2 wheeled, Environmental consultant - 4 wheeled, shower chair, and life alert  PLOF: Requires assistive device for independence  PATIENT GOALS: improve balance  OBJECTIVE:   COGNITION: Overall cognitive status: Within functional limits for tasks assessed   VITALS , Vitals:   12/10/22 1025  BP: (!) 154/80  Pulse: 62     TODAY'S TREATMENT:   Ther Act  Assessed vitals (see above) and had difficulty obtaining HR. With machine, HR at 46 bpm. Manually, HR at 62 bpm. Continued to monitor HR via pulse ox throughout session. Majority of session spent educating pt on his medical appointments, assisting pt in identifying number for transportation and providing handout of scheduled appointments and informing pt of location/purpose of each (per pt request). Pt very confused regarding appointments, as he thought he was in [place] today.  Further inquired  about pt's RW situation and pt reports he does not have another RW at home and he does not know who took his off his porch. Educated pt on safety concerns w/use of SPC and being more stable w/RW (reiterated from last session), but pt does not agree that he needs a RW at this time.    Ther Ex  SciFit multi-peaks level 3 for 10 minutes using BUE/BLEs for neural priming for reciprocal movement, dynamic cardiovascular conditioning and increased amplitude of stepping. Assessed pt's HR every 2.5 minutes, averaged 61-74 bpm.  HR at 61 bpm at end of session. RPE of 8/10 following activity     GAIT: Gait pattern: decreased arm swing- Right, decreased arm swing- Left, decreased step length- Right, decreased step length- Left, Right foot flat, Left foot flat, ataxic, wide BOS, poor foot clearance- Right, and poor foot clearance- Left Distance walked: various clinic distances  Assistive device utilized: Single point cane Level of assistance: CGA Comments: Noted increased instability around curves and pt frequently carrying cane and attempting to furniture walk around clinic. While leaving the clinic, pt attempted to make phone call while holding cane and appointment calendar handout and almost lost balance posteriorly, requiring mod A to stabilize and max verbal cues to wait until he is seated to make phone call. Escorted pt out of clinic until pt seated.      PATIENT EDUCATION: Education details: Calendar of appointments, importance of taking medicine and eating prior to PT appointments Person educated: Patient Education method: Explanation and Handouts Education comprehension: verbalized understanding and needs further education  HOME EXERCISE PROGRAM: TBD  GOALS: Goals reviewed with patient? Yes  SHORT TERM GOALS: Target date: 01/01/2023    Patient will be compliant with using RW for his in home and community ambulation to improve safety and reduce fall risk Baseline: Pt walked in with  straight cane today (12/04/22) Goal status: INITIAL  2.  Patient will be able be safely go up and down ramp and negotiate curb with RW to improve ambulation  and safety in community. Baseline: not attempted Goal status: INITIAL  3.  Pt will be demo 5x sit to stand without use of UE in <12 seconds to improve functional strength. Baseline: 14.75 sec without bil UE support (eval) Goal status: INITIAL   LONG TERM GOALS: Target date: 01/29/2023    Patient will demo berg balance scale score of >45/56 to reduce fall risk. Baseline: 36/56 (eval) Goal status: INITIAL  2.  Patient will demo gait speed of >0.28m/s with LRAD (hopefully cane) to improve functional ambulation and reduce fall risk Baseline: 0.49 m/s with RW (eval) Goal status: INITIAL  3.  Patient will demo TUG score of <16 sec with LRAD (hopefully cane) to improve functional mobility. Baseline: 24 sec with RW (eval) Goal status: INITIAL  4.  Patient will demo 10 points improvement on FOTO to improve overall function Baseline: FOTO 43 (eval) Goal status: INITIAL  5.  Pt will be I and compliant with HEP to self manage symptoms at home. Baseline: TBD Goal status: INITIAL   ASSESSMENT:  CLINICAL IMPRESSION: Emphasis of skilled PT session on pt education regarding therapy appointments, safety risks w/using SPC and monitoring HR w/exercise. Pt very confused as to where he was this date, thought he was in [place] to have EKG. Provided printed calendar of appointments in June for pt and reviewed them to ensure pt made his appointment tomorrow in [place]. Pt reports his RW was taken off his porch a few days ago and he does not know who took it. Session limited as pt did not take medicine this morning and had not eaten. Pt very fatigued on SciFit, requiring rest break every 2.5 minutes. HR the lowest at end of session following activity. Continue POC.   OBJECTIVE IMPAIRMENTS: Abnormal gait, decreased activity tolerance, decreased  balance, decreased coordination, decreased endurance, decreased mobility, difficulty walking, decreased strength, and postural dysfunction.   ACTIVITY LIMITATIONS: carrying, standing, squatting, stairs, transfers, and dressing  PARTICIPATION LIMITATIONS: meal prep, cleaning, laundry, shopping, and community activity  PERSONAL FACTORS: Age, Past/current experiences, Time since onset of injury/illness/exacerbation, and 1-2 comorbidities: hx of CVA, HTN  are also affecting patient's functional outcome.   REHAB POTENTIAL: Good  CLINICAL DECISION MAKING: Stable/uncomplicated  EVALUATION COMPLEXITY: Low  PLAN:  PT FREQUENCY: 2x/week  PT DURATION: 8 weeks  PLANNED INTERVENTIONS: Therapeutic exercises, Therapeutic activity, Neuromuscular re-education, Balance training, Gait training, Patient/Family education, Self Care, Joint mobilization, Stair training, Orthotic/Fit training, Cryotherapy, Moist heat, Manual therapy, and Re-evaluation  PLAN FOR NEXT SESSION: Issue HEP, work on Company secretary with varying BOS and support surfaces, practice ramp/curb with RW,    Davonne Jarnigan E Donita Newland, PT, DPT 12/10/2022, 11:03 AM

## 2022-12-11 ENCOUNTER — Ambulatory Visit: Payer: 59 | Attending: Cardiology | Admitting: Cardiology

## 2022-12-11 ENCOUNTER — Encounter: Payer: Self-pay | Admitting: Cardiology

## 2022-12-14 ENCOUNTER — Other Ambulatory Visit (HOSPITAL_COMMUNITY): Payer: Self-pay | Admitting: Family Medicine

## 2022-12-14 ENCOUNTER — Ambulatory Visit: Payer: 59 | Attending: Physical Medicine and Rehabilitation | Admitting: Physical Therapy

## 2022-12-14 VITALS — BP 117/64 | HR 78

## 2022-12-14 DIAGNOSIS — M6281 Muscle weakness (generalized): Secondary | ICD-10-CM | POA: Diagnosis present

## 2022-12-14 DIAGNOSIS — R2689 Other abnormalities of gait and mobility: Secondary | ICD-10-CM | POA: Diagnosis present

## 2022-12-14 DIAGNOSIS — Z9181 History of falling: Secondary | ICD-10-CM | POA: Diagnosis present

## 2022-12-14 DIAGNOSIS — R278 Other lack of coordination: Secondary | ICD-10-CM | POA: Diagnosis present

## 2022-12-14 DIAGNOSIS — J984 Other disorders of lung: Secondary | ICD-10-CM

## 2022-12-14 DIAGNOSIS — Z87891 Personal history of nicotine dependence: Secondary | ICD-10-CM

## 2022-12-14 NOTE — Therapy (Signed)
OUTPATIENT PHYSICAL THERAPY NEURO TREATMENT   Patient Name: Timothy Galvan MRN: [id number] DOB:April 03, 1956, 67 y.o., male Today's Date: 12/14/2022   REFERRING PROVIDER: Dr. Carlis Abbott  END OF SESSION:  PT End of Session - 12/14/22 1107     Visit Number 3    Number of Visits 10    Date for PT Re-Evaluation 01/29/23    Authorization Type UHC Medicare/Medicaid    PT Start Time 1103    PT Stop Time 1146    PT Time Calculation (min) 43 min    Equipment Utilized During Treatment Gait belt    Activity Tolerance Treatment limited secondary to medical complications (Comment)   SpO2 dropping to 77%, low diastolic BP   Behavior During Therapy WFL for tasks assessed/performed              Past Medical History:  Diagnosis Date   Asthma    Avascular necrosis of bone of hip (HCC)    Blood clot in vein    Cancer (HCC)    Coronary artery disease    GERD (gastroesophageal reflux disease)    Heart disease    Hypertension    Pneumonia    Past Surgical History:  Procedure Laterality Date   FEMORAL BYPASS     MANDIBLE SURGERY     WRIST SURGERY     Patient Active Problem List   Diagnosis Date Noted   HTN (hypertension) 06/15/2022   Left pontine stroke (HCC) 06/05/2022   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) 06/05/2022   Stroke (cerebrum) (HCC) 06/01/2022   PAD (peripheral artery disease) (HCC) 06/01/2022   Colon cancer (HCC) 06/01/2022   COPD (chronic obstructive pulmonary disease) (HCC) 06/01/2022   Blood clot in vein 03/08/2022   Cancer (HCC) 03/08/2022   Chest pain 01/16/2019   GERD (gastroesophageal reflux disease) 01/10/2018   CAD (coronary artery disease) 01/10/2018   Closed compression fracture of body of L1 vertebra (HCC) 01/10/2018   HLD (hyperlipidemia) 01/10/2018    ONSET DATE: 06/02/23      REFERRING DIAG: I63.9 (ICD-10-CM) - Cerebrovascular accident (CVA), unspecified mechanism (HCC)  THERAPY DIAG:  Other abnormalities of  gait and mobility  Muscle weakness (generalized)  Other lack of coordination  Rationale for Evaluation and Treatment: Rehabilitation  SUBJECTIVE:                                                                                                                                                                                             SUBJECTIVE STATEMENT: Pt ambulated into clinic w/RW. Pt states he did not have EKG last week, was "too tired" from  all his appointments. No falls.   Pt accompanied by: self  PERTINENT HISTORY: He has a history of hypertension, colon cancer and had a recent stroke back in November that left him with dysarthria and balance issues.  PAIN:  Are you having pain? No  PRECAUTIONS: Fall  WEIGHT BEARING RESTRICTIONS: No  FALLS: Has patient fallen in last 6 months? Yes. Number of falls 4 and multiple near falls daily  LIVING ENVIRONMENT: Lives with: lives alone Lives in: House/apartment Stairs: No Has following equipment at home: Single point cane, Environmental consultant - 2 wheeled, Environmental consultant - 4 wheeled, shower chair, and life alert  PLOF: Requires assistive device for independence  PATIENT GOALS: improve balance  OBJECTIVE:   COGNITION: Overall cognitive status: Within functional limits for tasks assessed   VITALS , Vitals:   12/14/22 1113 12/14/22 1136  BP: (!) 116/56 117/64  Pulse: 75 78  SpO2:  92%      TODAY'S TREATMENT:   Ther Act  Assessed vitals (see above) in pt's LUE and RUE and pt's diastolic BP low. Pt denied lightheadedness or dizziness, so closely monitored through remainder of session    Ther Ex  SciFit multi-peaks level 2 for 8 minutes using BUE/BLEs for neural priming for reciprocal movement, dynamic cardiovascular conditioning and increased amplitude of stepping. RPE of 8/10 following activity, HR at 85 bpm but pt's SpO2 dropped to 77% but did recover to 96% after 90 seconds.   Gait Training  Gait pattern: step through pattern, decreased  stride length, decreased hip/knee flexion- Right, decreased hip/knee flexion- Left, ataxic, trunk flexed, wide BOS, poor foot clearance- Right, and poor foot clearance- Left Distance walked: 345'  Assistive device utilized: Environmental consultant - 2 wheeled Level of assistance: CGA Comments: Mod verbal cues to stay inside the walker at all times and to slow down. Pt did well until distracted (looking around clinic, talking to therapist). Unable to obtain immediate SpO2 reading, but after 60s, SpO2 at 92% and HR 101 bpm.   Ther Act (cont)  Remainder of session spent discussing importance of pt having EKG and obtaining clearance from MD to participate in PT as his SpO2 is dropping and his diastolic BP very low. Attempted to call Dr. Merita Norton office to reschedule pt's appointment from 5/31 (pt request), but unable to reach front desk prior to end of session. Provided pt w/contact information for [facility] and pt is to call and make new appointment. Informed pt that we will likely need to pause PT until he has EKG and is cleared to participate, pt verbalized understanding. Will keep therapy appointment for this upcomming Wednesday to ensure pt made appointment for EKG and understands next steps in POC.     GAIT: Gait pattern: decreased step length- Right, decreased step length- Left, Right foot flat, Left foot flat, ataxic, trunk flexed, wide BOS, poor foot clearance- Right, and poor foot clearance- Left Distance walked: various clinic distances  Assistive device utilized: Single point cane and Walker - 2 wheeled Level of assistance: CGA Comments: Mod verbal cues to maintain close distance to RW, as pt tends to step outside RW.      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   Person educated: Patient Education method: Explanation and Handouts Education comprehension: verbalized understanding and needs  further education  HOME EXERCISE PROGRAM: TBD  GOALS: Goals reviewed with patient? Yes  SHORT TERM GOALS: Target date: 01/01/2023    Patient will  be compliant with using RW for his in home and community ambulation to improve safety and reduce fall risk Baseline: Pt walked in with straight cane today (12/04/22) Goal status: INITIAL  2.  Patient will be able be safely go up and down ramp and negotiate curb with RW to improve ambulation and safety in community. Baseline: not attempted Goal status: INITIAL  3.  Pt will be demo 5x sit to stand without use of UE in <12 seconds to improve functional strength. Baseline: 14.75 sec without bil UE support (eval) Goal status: INITIAL   LONG TERM GOALS: Target date: 01/29/2023    Patient will demo berg balance scale score of >45/56 to reduce fall risk. Baseline: 36/56 (eval) Goal status: INITIAL  2.  Patient will demo gait speed of >0.40m/s with LRAD (hopefully cane) to improve functional ambulation and reduce fall risk Baseline: 0.49 m/s with RW (eval) Goal status: INITIAL  3.  Patient will demo TUG score of <16 sec with LRAD (hopefully cane) to improve functional mobility. Baseline: 24 sec with RW (eval) Goal status: INITIAL  4.  Patient will demo 10 points improvement on FOTO to improve overall function Baseline: FOTO 43 (eval) Goal status: INITIAL  5.  Pt will be I and compliant with HEP to self manage symptoms at home. Baseline: TBD Goal status: INITIAL   ASSESSMENT:  CLINICAL IMPRESSION: Session limited as pt's SpO2 dropping w/activity and initial low diastolic BP. Pt reports he did not see his cardiologist last Friday, as he decided to skip appointment due to fatigue. Majority of session spent educating pt on importance of rescheduling appointment w/cardiologist and ensuring pt is safe to participate in therapy, as his diastolic BP is low and pt's SPO2 dropped to 77% following exercise this date. Pt does not have O2 tank  and required >1 minute to recover SpO2 to >92%. Therapist informed pt that we will likely need to pause PT until pt sees cardiologist and PCP to ensure he is safe to participate, but will keep next PT appointment to ensure pt did reschedule w/cardiologist. Pt verbalized understanding. Continue POC.   OBJECTIVE IMPAIRMENTS: Abnormal gait, decreased activity tolerance, decreased balance, decreased coordination, decreased endurance, decreased mobility, difficulty walking, decreased strength, and postural dysfunction.   ACTIVITY LIMITATIONS: carrying, standing, squatting, stairs, transfers, and dressing  PARTICIPATION LIMITATIONS: meal prep, cleaning, laundry, shopping, and community activity  PERSONAL FACTORS: Age, Past/current experiences, Time since onset of injury/illness/exacerbation, and 1-2 comorbidities: hx of CVA, HTN  are also affecting patient's functional outcome.   REHAB POTENTIAL: Good  CLINICAL DECISION MAKING: Stable/uncomplicated  EVALUATION COMPLEXITY: Low  PLAN:  PT FREQUENCY: 2x/week  PT DURATION: 8 weeks  PLANNED INTERVENTIONS: Therapeutic exercises, Therapeutic activity, Neuromuscular re-education, Balance training, Gait training, Patient/Family education, Self Care, Joint mobilization, Stair training, Orthotic/Fit training, Cryotherapy, Moist heat, Manual therapy, and Re-evaluation  PLAN FOR NEXT SESSION: Closely monitor BP and HR. Did he make appointment w/cardiologist? Will likely need to pause PT until he has EKG and sees PCP due to hypotension and hypoxia.   Issue HEP, work on Company secretary with varying BOS and support surfaces, practice ramp/curb with RW,    Mattye Verdone E Margarite Vessel, PT, DPT 12/14/2022, 11:54 AM

## 2022-12-16 ENCOUNTER — Encounter: Payer: Self-pay | Admitting: Physical Therapy

## 2022-12-16 ENCOUNTER — Ambulatory Visit: Payer: 59 | Admitting: Physical Therapy

## 2022-12-16 DIAGNOSIS — R278 Other lack of coordination: Secondary | ICD-10-CM

## 2022-12-16 DIAGNOSIS — R2689 Other abnormalities of gait and mobility: Secondary | ICD-10-CM

## 2022-12-16 DIAGNOSIS — M6281 Muscle weakness (generalized): Secondary | ICD-10-CM

## 2022-12-16 DIAGNOSIS — Z9181 History of falling: Secondary | ICD-10-CM

## 2022-12-16 NOTE — Therapy (Signed)
OUTPATIENT PHYSICAL THERAPY NEURO TREATMENT   Patient Name: Timothy Galvan MRN: [id number] DOB:11-30-1955, 67 y.o., male Today's Date: 12/16/2022   REFERRING PROVIDER: Dr. Carlis Abbott  END OF SESSION:  PT End of Session - 12/16/22 1113     Visit Number 4    Number of Visits 10    Date for PT Re-Evaluation 01/29/23    Authorization Type [employer] Medicare/Medicaid    PT Start Time 1018    PT Stop Time 1058    PT Time Calculation (min) 40 min    Equipment Utilized During Treatment Gait belt    Activity Tolerance Patient tolerated treatment well    Behavior During Therapy WFL for tasks assessed/performed               Past Medical History:  Diagnosis Date   Asthma    Avascular necrosis of bone of hip (HCC)    Blood clot in vein    Cancer (HCC)    Coronary artery disease    GERD (gastroesophageal reflux disease)    Heart disease    Hypertension    Pneumonia    Past Surgical History:  Procedure Laterality Date   FEMORAL BYPASS     MANDIBLE SURGERY     WRIST SURGERY     Patient Active Problem List   Diagnosis Date Noted   HTN (hypertension) 06/15/2022   Left pontine stroke (HCC) 06/05/2022   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) 06/05/2022   Stroke (cerebrum) (HCC) 06/01/2022   PAD (peripheral artery disease) (HCC) 06/01/2022   Colon cancer (HCC) 06/01/2022   COPD (chronic obstructive pulmonary disease) (HCC) 06/01/2022   Blood clot in vein 03/08/2022   Cancer (HCC) 03/08/2022   Chest pain 01/16/2019   GERD (gastroesophageal reflux disease) 01/10/2018   CAD (coronary artery disease) 01/10/2018   Closed compression fracture of body of L1 vertebra (HCC) 01/10/2018   HLD (hyperlipidemia) 01/10/2018    ONSET DATE: 06/02/23      REFERRING DIAG: I63.9 (ICD-10-CM) - Cerebrovascular accident (CVA), unspecified mechanism (HCC)  THERAPY DIAG:  Other abnormalities of gait and mobility  Muscle weakness (generalized)  Other lack of  coordination  History of falling  Rationale for Evaluation and Treatment: Rehabilitation  SUBJECTIVE:                                                                                                                                                                                             SUBJECTIVE STATEMENT:  Nothing new, still haven't seen cardiologist or gotten EKG, don't have an appointment with cardiologist yet either, didn't call them   Pt  accompanied by: self  PERTINENT HISTORY: He has a history of hypertension, colon cancer and had a recent stroke back in November that left him with dysarthria and balance issues.  PAIN:  Are you having pain? No  "I don't know" unable to use NPRS scale even with Mod cues from PT "I feel pretty good"   PRECAUTIONS: Fall  WEIGHT BEARING RESTRICTIONS: No  FALLS: Has patient fallen in last 6 months? Yes. Number of falls 4 and multiple near falls daily  LIVING ENVIRONMENT: Lives with: lives alone Lives in: House/apartment Stairs: No Has following equipment at home: Single point cane, Environmental consultant - 2 wheeled, Environmental consultant - 4 wheeled, shower chair, and life alert  PLOF: Requires assistive device for independence  PATIENT GOALS: improve balance  OBJECTIVE:   COGNITION: Overall cognitive status: Within functional limits for tasks assessed   VITALS , There were no vitals filed for this visit.     TODAY'S TREATMENT:    Unable to get signal via pulse ox at beginning of session no signs of hypoxia or DOE, BP 142/83 HR 71 at rest. Performed regular vitals checks between activities, WNL today but all activities were low intensity.   TherAct-  5x STS test, Berg balance test  SelfCare- lots of education regarding need for cardiology f/u and cardiologist clearance prior to continuing with PT, possibility of negative health outcomes with exercise without clearance from cardiologist. Spent quite a bit of time on hold with cardiologist's office,  provided education on POC while waiting- needed repeated education regarding current plan           GAIT: Gait pattern: decreased step length- Right, decreased step length- Left, Right foot flat, Left foot flat, ataxic, trunk flexed, wide BOS, poor foot clearance- Right, and poor foot clearance- Left Distance walked: various clinic distances  Assistive device utilized: Single point cane and Walker - 2 wheeled Level of assistance: CGA Comments: Mod verbal cues to maintain close distance to RW, as pt tends to step outside RW.      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   Person educated: Patient Education method: Explanation and Handouts Education comprehension: verbalized understanding and needs further education  HOME EXERCISE PROGRAM: TBD  GOALS: Goals reviewed with patient? Yes  SHORT TERM GOALS: Target date: 01/01/2023    Patient will be compliant with using RW for his in home and community ambulation to improve safety and reduce fall risk Baseline: Pt walked in with straight cane today (12/04/22) Goal status: MET 12/16/22  2.  Patient will be able be safely go up and down ramp and negotiate curb with RW to improve ambulation and safety in community. Baseline: not attempted Goal status: IN PROGRESS/DEFERRED did not attempt 12/16/22  3.  Pt will be demo 5x sit to stand without use of UE in <12 seconds to improve functional strength. Baseline: 14.75 sec without bil UE support (eval) Goal status: NOT MET 12/16/22 17 seconds, SpO2 97% RA after test    LONG TERM GOALS: Target date: 01/29/2023    Patient will demo berg balance scale score of >45/56 to reduce fall risk. Baseline: 36/56 (eval) Goal status: IN PROGRESS 12/16/22 37/56  2.  Patient will demo gait speed of >0.6m/s with LRAD (hopefully cane) to improve functional ambulation and reduce fall risk Baseline: 0.49 m/s with RW  (eval) Goal status: IN PROGRESS 12/16/22 did not assess in favor of trying to contact cardiologist  3.  Patient will demo TUG score of <16 sec with LRAD (hopefully cane) to improve functional mobility. Baseline: 24 sec with RW (eval) Goal status: IN PROGRESS 12/16/22 deferred in favor of trying to call cardiologist   4.  Patient will demo 10 points improvement on FOTO to improve overall function Baseline: FOTO 43 (eval) Goal status: INITIAL  5.  Pt will be I and compliant with HEP to self manage symptoms at home. Baseline: TBD Goal status: IN PROGRESS 12/16/22   ASSESSMENT:  CLINICAL IMPRESSION:  Updated and obtained objective measures today prior to PT hold- per discussion with primary PT, we are placing patient on hold until after he is able to get cleared by cardiologist given poor exercise tolerance as well as poor vitals response to exercise in the previous past 2 sessions. Also spent a large chunk of session on phone with cardiologist's office trying to make appointment (pt had not done so himself yet). Placed on hold until f/u visit with cardio to ensure safety in/clearance for PT.   OBJECTIVE IMPAIRMENTS: Abnormal gait, decreased activity tolerance, decreased balance, decreased coordination, decreased endurance, decreased mobility, difficulty walking, decreased strength, and postural dysfunction.   ACTIVITY LIMITATIONS: carrying, standing, squatting, stairs, transfers, and dressing  PARTICIPATION LIMITATIONS: meal prep, cleaning, laundry, shopping, and community activity  PERSONAL FACTORS: Age, Past/current experiences, Time since onset of injury/illness/exacerbation, and 1-2 comorbidities: hx of CVA, HTN  are also affecting patient's functional outcome.   REHAB POTENTIAL: Good  CLINICAL DECISION MAKING: Stable/uncomplicated  EVALUATION COMPLEXITY: Low  PLAN:  PT FREQUENCY: 2x/week  PT DURATION: 8 weeks  PLANNED INTERVENTIONS: Therapeutic exercises, Therapeutic activity,  Neuromuscular re-education, Balance training, Gait training, Patient/Family education, Self Care, Joint mobilization, Stair training, Orthotic/Fit training, Cryotherapy, Moist heat, Manual therapy, and Re-evaluation  PLAN FOR NEXT SESSION: on formal hold until after clearance from cardiologist- next available appointment was not until July 19th but he is on the wait list for any appts that open up sooner    Nedra Hai PT DPT PN2

## 2022-12-21 ENCOUNTER — Ambulatory Visit: Payer: 59 | Admitting: Physical Therapy

## 2022-12-22 ENCOUNTER — Ambulatory Visit: Payer: 59 | Admitting: Physical Therapy

## 2022-12-23 ENCOUNTER — Encounter: Payer: 59 | Admitting: Speech Pathology

## 2022-12-23 ENCOUNTER — Ambulatory Visit: Payer: 59 | Admitting: Physical Therapy

## 2022-12-25 ENCOUNTER — Ambulatory Visit: Payer: 59 | Admitting: Physical Therapy

## 2022-12-25 ENCOUNTER — Ambulatory Visit: Payer: 59 | Admitting: Speech Pathology

## 2022-12-28 ENCOUNTER — Ambulatory Visit: Payer: 59 | Admitting: Physical Therapy

## 2022-12-30 ENCOUNTER — Ambulatory Visit: Payer: 59 | Admitting: Physical Therapy

## 2023-01-04 ENCOUNTER — Ambulatory Visit: Payer: 59 | Admitting: Physical Therapy

## 2023-01-06 ENCOUNTER — Ambulatory Visit: Payer: 59 | Admitting: Physical Therapy

## 2023-01-11 ENCOUNTER — Ambulatory Visit: Payer: 59 | Admitting: Physical Therapy

## 2023-01-13 ENCOUNTER — Ambulatory Visit: Payer: 59 | Admitting: Physical Therapy

## 2023-01-18 ENCOUNTER — Ambulatory Visit: Payer: 59 | Admitting: Physical Therapy

## 2023-01-20 ENCOUNTER — Ambulatory Visit: Payer: 59 | Admitting: Physical Therapy

## 2023-01-25 ENCOUNTER — Encounter: Payer: Self-pay | Admitting: Physical Medicine and Rehabilitation

## 2023-01-25 ENCOUNTER — Encounter: Payer: 59 | Attending: Physical Medicine and Rehabilitation | Admitting: Physical Medicine and Rehabilitation

## 2023-01-25 VITALS — BP 142/89 | HR 69 | Ht 63.0 in | Wt 140.0 lb

## 2023-01-25 DIAGNOSIS — I10 Essential (primary) hypertension: Secondary | ICD-10-CM | POA: Diagnosis not present

## 2023-01-25 DIAGNOSIS — R471 Dysarthria and anarthria: Secondary | ICD-10-CM

## 2023-01-25 DIAGNOSIS — M25551 Pain in right hip: Secondary | ICD-10-CM | POA: Insufficient documentation

## 2023-01-25 DIAGNOSIS — Z760 Encounter for issue of repeat prescription: Secondary | ICD-10-CM | POA: Insufficient documentation

## 2023-01-25 DIAGNOSIS — Z79899 Other long term (current) drug therapy: Secondary | ICD-10-CM | POA: Diagnosis not present

## 2023-01-25 DIAGNOSIS — W19XXXA Unspecified fall, initial encounter: Secondary | ICD-10-CM | POA: Insufficient documentation

## 2023-01-25 DIAGNOSIS — G4730 Sleep apnea, unspecified: Secondary | ICD-10-CM | POA: Diagnosis not present

## 2023-01-25 DIAGNOSIS — W19XXXS Unspecified fall, sequela: Secondary | ICD-10-CM | POA: Diagnosis not present

## 2023-01-25 DIAGNOSIS — Z8673 Personal history of transient ischemic attack (TIA), and cerebral infarction without residual deficits: Secondary | ICD-10-CM | POA: Diagnosis not present

## 2023-01-25 DIAGNOSIS — R531 Weakness: Secondary | ICD-10-CM | POA: Diagnosis not present

## 2023-01-25 DIAGNOSIS — R059 Cough, unspecified: Secondary | ICD-10-CM | POA: Insufficient documentation

## 2023-01-25 DIAGNOSIS — I159 Secondary hypertension, unspecified: Secondary | ICD-10-CM

## 2023-01-25 DIAGNOSIS — M549 Dorsalgia, unspecified: Secondary | ICD-10-CM | POA: Diagnosis not present

## 2023-01-25 DIAGNOSIS — M25552 Pain in left hip: Secondary | ICD-10-CM | POA: Diagnosis not present

## 2023-01-25 MED ORDER — BENZONATATE 100 MG PO CAPS: 100.0000 mg | ORAL_CAPSULE | Freq: Three times a day (TID) | ORAL | 0 refills | Status: DC | PRN

## 2023-01-25 NOTE — Addendum Note (Signed)
Addended by: Horton Chin on: 01/25/2023 10:52 AM   Modules accepted: Orders

## 2023-01-25 NOTE — Patient Instructions (Signed)
HTN: -BP is 142/89 today.  -Advised checking BP daily at home and logging results to bring into follow-up appointment with PCP and myself. -Reviewed BP meds today.  -Advised regarding healthy foods that can help lower blood pressure and provided with a list: 1) citrus foods- high in vitamins and minerals 2) salmon and other fatty fish - reduces inflammation and oxylipins 3) swiss chard (leafy green)- high level of nitrates 4) pumpkin seeds- one of the best natural sources of magnesium 5) Beans and lentils- high in fiber, magnesium, and potassium 6) Berries- high in flavonoids 7) Amaranth (whole grain, can be cooked similarly to rice and oats)- high in magnesium and fiber 8) Pistachios- even more effective at reducing BP than other nuts 9) Carrots- high in phenolic compounds that relax blood vessels and reduce inflammation 10) Celery- contain phthalides that relax tissues of arterial walls 11) Tomatoes- can also improve cholesterol and reduce risk of heart disease 12) Broccoli- good source of magnesium, calcium, and potassium 13) Greek yogurt: high in potassium and calcium 14) Herbs and spices: Celery seed, cilantro, saffron, lemongrass, black cumin, ginseng, cinnamon, cardamom, sweet basil, and ginger 15) Chia and flax seeds- also help to lower cholesterol and blood sugar 16) Beets- high levels of nitrates that relax blood vessels  17) spinach and bananas- high in potassium  -Provided lise of supplements that can help with hypertension:  1) magnesium: one high quality brand is Bioptemizers since it contains all 7 types of magnesium, otherwise over the counter magnesium gluconate 400mg  is a good option 2) B vitamins 3) vitamin D 4) potassium 5) CoQ10 6) L-arginine 7) Vitamin C 8) Beetroot -Educated that goal BP is 120/80. -Made goal to incorporate some of the above foods into diet.

## 2023-01-25 NOTE — Progress Notes (Addendum)
Subjective:    Patient ID: Timothy Galvan, male    DOB: Mar 03, 1956, 67 y.o.   MRN: [id number]  HPI Timothy Galvan is a 67 year old man who presents for follow-up of CVA  1) CVA -completed his home therapy -needs refills of some of his medications but is not sure which -he has neurology appointment scheduled  -taking his statin -balance is off -walking better -feels a little weak but that that he is getting better every day  2) HTN -he takes amlodipine, Cozaar, Hydralazine -BP is 142/89 -has been feeling much better with the lower dose of blood pressure medication  Review of Systems  Musculoskeletal:  Positive for back pain.       B/L leg hip pain  All other systems reviewed and are negative.     Objective:   Physical Exam Gen: no distress, normal appearing Gen: no distress, normal appearing HEENT: oral mucosa pink and moist, NCAT Cardio: Reg rate Chest: normal effort, normal rate of breathing Abd: soft, non-distended Ext: no edema Psych: pleasant, normal affect Skin: intact Neuro: Alert and oriented, right sided weakness, ambulates with the cane     Assessment & Plan:   1) CVA -continue HEP -prescribed physical therapy for balance -would benefit from handicap placard to increase mobility in the community -reviewed all medications and provided necessary refills. -discussed vagal nerve stimulation if strength fails to improve in 6 months.  -encouraged walking outside every day -discussed that it is better to take a methylated multivitamin  2) HTN: -discussed that he is having less dizziness since Cozaar has been decreased, discussed that with 50mg   -has cardiology follow-up.  -stop amlodipine -decreased cozaar from 50mg  to 25mg  daily, continue this dose -Advised checking BP daily at home and logging results to bring into follow-up appointment with PCP and myself. -Reviewed BP meds today.  -Advised regarding healthy foods that can help lower blood pressure and  provided with a list: 1) citrus foods- high in vitamins and minerals 2) salmon and other fatty fish - reduces inflammation and oxylipins 3) swiss chard (leafy green)- high level of nitrates 4) pumpkin seeds- one of the best natural sources of magnesium 5) Beans and lentils- high in fiber, magnesium, and potassium 6) Berries- high in flavonoids 7) Amaranth (whole grain, can be cooked similarly to rice and oats)- high in magnesium and fiber 8) Pistachios- even more effective at reducing BP than other nuts 9) Carrots- high in phenolic compounds that relax blood vessels and reduce inflammation 10) Celery- contain phthalides that relax tissues of arterial walls 11) Tomatoes- can also improve cholesterol and reduce risk of heart disease 12) Broccoli- good source of magnesium, calcium, and potassium 13) Greek yogurt: high in potassium and calcium 14) Herbs and spices: Celery seed, cilantro, saffron, lemongrass, black cumin, ginseng, cinnamon, cardamom, sweet basil, and ginger 15) Chia and flax seeds- also help to lower cholesterol and blood sugar 16) Beets- high levels of nitrates that relax blood vessels  17) spinach and bananas- high in potassium  -Provided lise of supplements that can help with hypertension:  1) magnesium: one high quality brand is Bioptemizers since it contains all 7 types of magnesium, otherwise over the counter magnesium gluconate 400mg  is a good option 2) B vitamins 3) vitamin D 4) potassium 5) CoQ10 6) L-arginine 7) Vitamin C 8) Beetroot -Educated that goal BP is 120/80. -Made goal to incorporate some of the above foods into diet.    3) Fall -discussed that he did not  suffer any injuries, discussed that he has not had any falls since blood pressure medication decreased -PT ordered  4) Dysarthria: -SLP ordered  5) Cough: -tessalon ordered  6) Sleep apnea -patient has been unable to get CPAP, will refer to pulmonology

## 2023-01-25 NOTE — Addendum Note (Signed)
Addended by: Horton Chin on: 01/25/2023 11:16 AM   Modules accepted: Orders

## 2023-01-26 ENCOUNTER — Ambulatory Visit: Payer: 59 | Admitting: Physical Therapy

## 2023-01-28 ENCOUNTER — Ambulatory Visit: Payer: 59 | Admitting: Physical Therapy

## 2023-01-29 ENCOUNTER — Encounter: Payer: Self-pay | Admitting: Cardiology

## 2023-01-29 ENCOUNTER — Ambulatory Visit: Payer: 59 | Attending: Cardiology | Admitting: Cardiology

## 2023-01-29 VITALS — BP 100/62 | HR 75 | Ht 63.0 in | Wt 136.8 lb

## 2023-01-29 DIAGNOSIS — I2510 Atherosclerotic heart disease of native coronary artery without angina pectoris: Secondary | ICD-10-CM

## 2023-01-29 DIAGNOSIS — I480 Paroxysmal atrial fibrillation: Secondary | ICD-10-CM | POA: Diagnosis not present

## 2023-01-29 DIAGNOSIS — I10 Essential (primary) hypertension: Secondary | ICD-10-CM

## 2023-01-29 DIAGNOSIS — E7800 Pure hypercholesterolemia, unspecified: Secondary | ICD-10-CM | POA: Diagnosis not present

## 2023-01-29 DIAGNOSIS — F1910 Other psychoactive substance abuse, uncomplicated: Secondary | ICD-10-CM

## 2023-01-29 NOTE — Progress Notes (Signed)
Cardiology Office Note:    Date:  01/29/2023   ID:  Timothy Galvan, DOB 02-Oct-1955, MRN [id number]  PCP:  [facility]   [facility] Providers Cardiologist:  Debbe Odea, MD     Referring MD: Jannet Askew, MD   Chief Complaint  Patient presents with   New Patient (Initial Visit)    Referred for CAD and Heart failure evaluation (referral records in media).  Previously seen at [facility].  Medications confirmed with step daughter Timothy Galvan.    History of Present Illness:    Timothy Galvan is a 67 y.o. male with a hx of CAD (non obstructive, 50%mLAD, 50% pRCA on [facility] 7/20), PAF, HTN, HLD, PAD (s/p R-L fem-fem bypass 2022), Coccaine use, current smoker x 30+ yrs, CVA who presents to establish care.  [facility] from [facility] cardiac perspective. Prior cardiac workup and recent testing is listed below. History today limited by condition of patient. Speech appears slurred. Endorses still smoking and also cocaine use. Supposed to be on eliquis but not listed on his list. He has occasional cough and shortness of breath but no chest pain.  Prior studies Echo 05/2022 EF 45-50% Lexiscan myoview 07/2021 apical scar, on ischemia Echo 06/2021 EF 55-60% Echo 2020 EF 45% [facility] at [facility] 01/2019 50% mLAD, 50% pRCA  Past Medical History:  Diagnosis Date   Asthma    Avascular necrosis of bone of hip (HCC)    Blood clot in vein    Cancer (HCC)    Carotid stenosis, bilateral    Cerebrovascular accident (CVA), unspecified mechanism (HCC)    Coronary artery disease    GERD (gastroesophageal reflux disease)    Heart disease    Hypertension    PAD (peripheral artery disease) (HCC)    Pneumonia     Past Surgical History:  Procedure Laterality Date   FEMORAL BYPASS     MANDIBLE SURGERY     WRIST SURGERY      Current Medications: Current Meds  Medication Sig   aspirin EC 81 MG tablet Take 1 tablet (81 mg total) by mouth daily. Swallow whole.    atorvastatin (LIPITOR) 40 MG tablet Take 1 tablet (40 mg total) by mouth at bedtime.   Blood Pressure Monitoring (BLOOD PRESSURE MONITOR & DTX) KIT 1 each by Does not apply route daily.   clopidogrel (PLAVIX) 75 MG tablet Take 1 tablet (75 mg total) by mouth daily.   diclofenac Sodium (VOLTAREN) 1 % GEL Apply 4 g topically 4 (four) times daily.   gabapentin (NEURONTIN) 400 MG capsule Take 1 capsule (400 mg total) by mouth 3 (three) times daily.   Ipratropium-Albuterol (COMBIVENT) 20-100 MCG/ACT AERS respimat Inhale 1 puff into the lungs every 6 (six) hours as needed for wheezing.   isosorbide mononitrate (IMDUR) 120 MG 24 hr tablet Take 120 mg by mouth daily.   JARDIANCE 10 MG TABS tablet Take 10 mg by mouth daily.   losartan (COZAAR) 25 MG tablet Take 1 tablet (25 mg total) by mouth daily.   magnesium oxide (MAG-OX) 400 MG tablet Take 1 tablet (400 mg total) by mouth at bedtime.   Melatonin 5 MG CAPS Take 1 capsule (5 mg total) by mouth daily.   Multiple Vitamin (MULTIVITAMIN WITH MINERALS) TABS tablet Take 1 tablet by mouth daily.   nitroGLYCERIN (NITROSTAT) 0.4 MG SL tablet Place 1 tablet (0.4 mg total) under the tongue every 5 (five) minutes as needed for chest pain.   pantoprazole (  PROTONIX) 40 MG tablet Take 1 tablet (40 mg total) by mouth daily.   thiamine (VITAMIN B1) 100 MG tablet Take 1 tablet (100 mg total) by mouth daily.   umeclidinium bromide (INCRUSE ELLIPTA) 62.5 MCG/ACT AEPB Inhale 1 puff into the lungs daily.   Vitamin D, Ergocalciferol, (DRISDOL) 1.25 MG (50000 UNIT) CAPS capsule Take 1 capsule (50,000 Units total) by mouth every 7 (seven) days. (Patient taking differently: Take 50,000 Units by mouth every 30 (thirty) days.)     Allergies:   Penicillins   Social History   Socioeconomic History   Marital status: Single    Spouse name: Not on file   Number of children: Not on file   Years of education: Not on file   Highest education level: Not on file  Occupational  History   Not on file  Tobacco Use   Smoking status: Every Day    Current packs/day: 1.00    Types: Cigarettes   Smokeless tobacco: Never  Vaping Use   Vaping status: Some Days   Substances: Flavoring  Substance and Sexual Activity   Alcohol use: Yes    Alcohol/week: 12.0 standard drinks of alcohol    Types: 12 Cans of beer per week   Drug use: Yes    Types: Cocaine    Comment: last month   Sexual activity: Not on file  Other Topics Concern   Not on file  Social History Narrative   Not on file   Social Determinants of Health   Financial Resource Strain: Low Risk  (05/29/2021)   Received from [facility], [facility]   Overall Financial Resource Strain (CARDIA)    Difficulty of Paying Living Expenses: Not very hard  Food Insecurity: No Food Insecurity (06/04/2022)   Hunger Vital Sign    Worried About Running Out of Food in the Last Year: Never true    Ran Out of Food in the Last Year: Never true  Transportation Needs: No Transportation Needs (05/29/2021)   [facility] from [facility], [facility]   [facility] - Transportation    Lack of Transportation (Medical): No    Lack of Transportation (Non-Medical): No  Physical Activity: Not on file  Stress: Not on file  Social Connections: Not on file     Family History: The patient's family history is not on file.  ROS:   Please see the history of present illness.     All other systems reviewed and are negative.  EKGs/Labs/Other Studies Reviewed:    The following studies were reviewed today:  Normal sinus rhythm, LVH      Recent Labs: 06/06/2022: ALT 25 11/29/2022: BUN 28; Creatinine, Ser 1.36; Hemoglobin 14.2; Magnesium 2.2; Platelets 225; Potassium 4.0; Sodium 140  Recent Lipid Panel    Component Value Date/Time   CHOL 134 06/02/2022 0351   TRIG 165 (H) 06/02/2022 0351   HDL 49 06/02/2022 0351   CHOLHDL 2.7 06/02/2022 0351   VLDL 33 06/02/2022 0351   LDLCALC 52 06/02/2022 0351     Risk  Assessment/Calculations:              Physical Exam:    VS:  BP 100/62 (BP Location: Left Arm, Patient Position: Sitting, Cuff Size: Large)   Pulse 75   Ht 5\' 3"  (1.6 m)   Wt 136 lb 12.8 oz (62.1 kg)   SpO2 99%   BMI 24.23 kg/m     Wt Readings from Last 3 Encounters:  01/29/23 136 lb 12.8  oz (62.1 kg)  01/25/23 140 lb (63.5 kg)  11/29/22 128 lb (58.1 kg)     GEN:  Well nourished, well developed in no acute distress HEENT: Normal NECK: No JVD; No carotid bruits CARDIAC: RRR, no murmurs, rubs, gallops RESPIRATORY:  diminished breath sound bilaterally ABDOMEN: Soft, non-tender, non-distended MUSCULOSKELETAL:  No edema; No deformity  SKIN: Warm and dry NEUROLOGIC:  Alert and oriented x 3 PSYCHIATRIC:  Normal affect   ASSESSMENT:    1. Coronary artery disease involving native coronary artery of native heart, unspecified whether angina present   2. Paroxysmal atrial fibrillation (HCC)   3. Primary hypertension   4. Pure hypercholesterolemia   5. Polysubstance abuse (HCC)    PLAN:    In order of problems listed above:  Non obstructive CAD 50%mLAD, 50%pRCA. Cont lipitor 80, imdur 120mg  every day. Start eliquis due to hx of afib. Stop asa, plavix. Paf. Currently in sinus. Hx of CVA. Start eliquis 5mg  bid. Obtain echo. HTN. Bp controlled, cont losartan, Imdur. HLD. Cont lipitor 80mg  every day. Smoking, also coccaine use. Cessation advised.  F/u in 3 months       Medication Adjustments/Labs and Tests Ordered: Current medicines are reviewed at length with the patient today.  Concerns regarding medicines are outlined above.  Orders Placed This Encounter  Procedures   EKG 12-Lead   ECHOCARDIOGRAM COMPLETE   No orders of the defined types were placed in this encounter.   Patient Instructions  Medication Instructions:   Your physician recommends that you continue on your current medications as directed. Please refer to the Current Medication list given to you  today.  *If you need a refill on your cardiac medications before your next appointment, please call your pharmacy*   Lab Work:  None Ordered  If you have labs (blood work) drawn today and your tests are completely normal, you will receive your results only by: MyChart Message (if you have MyChart) OR A paper copy in the mail If you have any lab test that is abnormal or we need to change your treatment, we will call you to review the results.   Testing/Procedures:  Your physician has requested that you have an echocardiogram. Echocardiography is a painless test that uses sound waves to create images of your heart. It provides your doctor with information about the size and shape of your heart and how well your heart's chambers and valves are working. This procedure takes approximately one hour. There are no restrictions for this procedure. Please do NOT wear cologne, perfume, aftershave, or lotions (deodorant is allowed). Please arrive 15 minutes prior to your appointment time.    Follow-Up: At [facility], you and your health needs are our priority.  As part of our continuing mission to provide you with exceptional heart care, we have created designated Provider Care Teams.  These Care Teams include your primary Cardiologist (physician) and Advanced Practice Providers (APPs -  Physician Assistants and Nurse Practitioners) who all work together to provide you with the care you need, when you need it.  We recommend signing up for the patient portal called "MyChart".  Sign up information is provided on this After Visit Summary.  MyChart is used to connect with patients for Virtual Visits (Telemedicine).  Patients are able to view lab/test results, encounter notes, upcoming appointments, etc.  Non-urgent messages can be sent to your provider as well.   To learn more about what you can do with MyChart, go to [email or web address].  Your next appointment:   3  month(s)  Provider:   You may see Debbe Odea, MD or one of the following Advanced Practice Providers on your designated Care Team:   Nicolasa Ducking, NP Eula Listen, PA-C Cadence Fransico Michael, PA-C Charlsie Quest, NP   Signed, Debbe Odea, MD  01/29/2023 12:37 PM    [facility]

## 2023-01-29 NOTE — Patient Instructions (Signed)
Medication Instructions:   Your physician recommends that you continue on your current medications as directed. Please refer to the Current Medication list given to you today.  *If you need a refill on your cardiac medications before your next appointment, please call your pharmacy*   Lab Work:  None Ordered  If you have labs (blood work) drawn today and your tests are completely normal, you will receive your results only by: MyChart Message (if you have MyChart) OR A paper copy in the mail If you have any lab test that is abnormal or we need to change your treatment, we will call you to review the results.   Testing/Procedures:  Your physician has requested that you have an echocardiogram. Echocardiography is a painless test that uses sound waves to create images of your heart. It provides your doctor with information about the size and shape of your heart and how well your heart's chambers and valves are working. This procedure takes approximately one hour. There are no restrictions for this procedure. Please do NOT wear cologne, perfume, aftershave, or lotions (deodorant is allowed). Please arrive 15 minutes prior to your appointment time.    Follow-Up: At  [facility]Care, you and your health needs are our priority.  As part of our continuing mission to provide you with exceptional heart care, we have created designated Provider Care Teams.  These Care Teams include your primary Cardiologist (physician) and Advanced Practice Providers (APPs -  Physician Assistants and Nurse Practitioners) who all work together to provide you with the care you need, when you need it.  We recommend signing up for the patient portal called "MyChart".  Sign up information is provided on this After Visit Summary.  MyChart is used to connect with patients for Virtual Visits (Telemedicine).  Patients are able to view lab/test results, encounter notes, upcoming appointments, etc.  Non-urgent messages can  be sent to your provider as well.   To learn more about what you can do with MyChart, go to [email or web address].    Your next appointment:   3 month(s)  Provider:   You may see Brian Agbor-Etang, MD or one of the following Advanced Practice Providers on your designated Care Team:   Christopher Berge, NP Ryan Dunn, PA-C Cadence Furth, PA-C Sheri Hammock, NP 

## 2023-02-01 ENCOUNTER — Ambulatory Visit: Payer: 59 | Admitting: Physical Therapy

## 2023-02-01 ENCOUNTER — Ambulatory Visit: Payer: 59 | Admitting: Speech Pathology

## 2023-02-03 ENCOUNTER — Ambulatory Visit: Payer: 59 | Admitting: Physical Therapy

## 2023-02-09 ENCOUNTER — Ambulatory Visit (HOSPITAL_COMMUNITY): Admission: RE | Admit: 2023-02-09 | Payer: 59 | Source: Ambulatory Visit

## 2023-02-15 ENCOUNTER — Other Ambulatory Visit: Payer: Self-pay | Admitting: Physical Medicine and Rehabilitation

## 2023-02-17 NOTE — Progress Notes (Signed)
02/19/23- 66 yoM for sleep evaluation courtesy of Sula Soda, MD with concern of OSA Medical problem list includes CAD/ PAD, hx CVA, HTN, COPD, hx Colon Cancer, Hyperlipidemia, Lives alone. Eporth score- Body weight today-140 lbs ------Had sleep study at [facility]. Here to see about getting a machine.  Split Sleep Study 12/21/20 [facility] Complex sleep apnea- OSA/CSA, AHI 91.9/ hr, desat to 82%, body weight > BIPAP ST 17/12, back up rate 16/min, used small [place] Inarticulate patient, accompanied by family member who helps with form and questions. He c/o difficulty initiating and maintaining sleep. Melatonin not helping. We discussed sleep study results and treatment options.  Pending dental work.  Prior to Admission medications   Medication Sig Start Date End Date Taking? Authorizing Provider  aspirin EC 81 MG tablet Take 1 tablet (81 mg total) by mouth daily. Swallow whole. 06/13/22  Yes Love, Evlyn Kanner, PA-C  atorvastatin (LIPITOR) 40 MG tablet Take 1 tablet (40 mg total) by mouth at bedtime. 06/11/22  Yes Love, Evlyn Kanner, PA-C  Blood Pressure Monitoring (BLOOD PRESSURE MONITOR & DTX) KIT 1 each by Does not apply route daily. 09/28/22  Yes [name], Drema Pry, MD  clopidogrel (PLAVIX) 75 MG tablet Take 1 tablet (75 mg total) by mouth daily. 06/11/22  Yes Love, Evlyn Kanner, PA-C  diclofenac Sodium (VOLTAREN) 1 % GEL Apply 4 g topically 4 (four) times daily. 08/13/22  Yes [provider]  gabapentin (NEURONTIN) 400 MG capsule TAKE 1 CAPSULE BY MOUTH 3 TIMES  DAILY 02/16/23  Yes [name], Drema Pry, MD  Ipratropium-Albuterol (COMBIVENT) 20-100 MCG/ACT AERS respimat Inhale 1 puff into the lungs every 6 (six) hours as needed for wheezing.   Yes [provider]  isosorbide mononitrate (IMDUR) 120 MG 24 hr tablet Take 120 mg by mouth daily. 06/26/22  Yes [provider]  JARDIANCE 10 MG TABS tablet Take 10 mg by mouth daily. 11/05/22  Yes [provider]  losartan  (COZAAR) 25 MG tablet Take 1 tablet (25 mg total) by mouth daily. 11/27/22  Yes [name], Drema Pry, MD  magnesium oxide (MAG-OX) 400 MG tablet Take 1 tablet (400 mg total) by mouth at bedtime. 06/11/22  Yes Love, Evlyn Kanner, PA-C  Melatonin 5 MG CAPS Take 1 capsule (5 mg total) by mouth daily. 09/28/22  Yes [name], Drema Pry, MD  Multiple Vitamin (MULTIVITAMIN WITH MINERALS) TABS tablet Take 1 tablet by mouth daily. 06/12/22  Yes Love, Evlyn Kanner, PA-C  nitroGLYCERIN (NITROSTAT) 0.4 MG SL tablet Place 1 tablet (0.4 mg total) under the tongue every 5 (five) minutes as needed for chest pain. 02/04/19  Yes Jeanmarie Plant, MD  pantoprazole (PROTONIX) 40 MG tablet Take 1 tablet (40 mg total) by mouth daily. 06/12/22  Yes Love, Evlyn Kanner, PA-C  thiamine (VITAMIN B1) 100 MG tablet Take 1 tablet (100 mg total) by mouth daily. 06/12/22  Yes Love, Evlyn Kanner, PA-C  traZODone (DESYREL) 50 MG tablet 1 at bedtime for sleep 02/19/23  Yes , Joni Fears D, MD  umeclidinium bromide (INCRUSE ELLIPTA) 62.5 MCG/ACT AEPB Inhale 1 puff into the lungs daily. 06/12/22  Yes Love, Evlyn Kanner, PA-C  Vitamin D, Ergocalciferol, (DRISDOL) 1.25 MG (50000 UNIT) CAPS capsule Take 1 capsule (50,000 Units total) by mouth every 7 (seven) days. Patient taking differently: Take 50,000 Units by mouth every 30 (thirty) days. 06/15/22  Yes Love, Evlyn Kanner, PA-C   Past Medical History:  Diagnosis Date   Asthma    Avascular necrosis of bone of  hip (HCC)    Blood clot in vein    Cancer (HCC)    Carotid stenosis, bilateral    Cerebrovascular accident (CVA), unspecified mechanism (HCC)    Coronary artery disease    GERD (gastroesophageal reflux disease)    Heart disease    Hypertension    PAD (peripheral artery disease) (HCC)    Pneumonia    Past Surgical History:  Procedure Laterality Date   FEMORAL BYPASS     MANDIBLE SURGERY     WRIST SURGERY     No family history on file. Social History   Socioeconomic History   Marital status: Single     Spouse name: Not on file   Number of children: Not on file   Years of education: Not on file   Highest education level: Not on file  Occupational History   Not on file  Tobacco Use   Smoking status: Every Day    Current packs/day: 1.00    Types: Cigarettes   Smokeless tobacco: Never   Tobacco comments:    Currently smoking 1ppd.  At the most he smoked 2 ppd in the pack.  Trying to quit.  02/19/2023 hfb  Vaping Use   Vaping status: Some Days   Substances: Flavoring  Substance and Sexual Activity   Alcohol use: Yes    Alcohol/week: 12.0 standard drinks of alcohol    Types: 12 Cans of beer per week   Drug use: Yes    Types: Cocaine    Comment: last month   Sexual activity: Not on file  Other Topics Concern   Not on file  Social History Narrative   Not on file   Social Determinants of Health   Financial Resource Strain: Low Risk  (05/29/2021)   [facility]   Overall Financial Resource Strain (CARDIA)    Difficulty of Paying Living Expenses: Not very hard  Food Insecurity: No Food Insecurity (06/04/2022)   Hunger Vital Sign    Worried About Running Out of Food in the Last Year: Never true    Ran Out of Food in the Last Year: Never true  Transportation Needs: No Transportation Needs (05/29/2021)   Received from [facility], [facility]   [facility] - Transportation    Lack of Transportation (Medical): No    Lack of Transportation (Non-Medical): No  Physical Activity: Not on file  Stress: Not on file  Social Connections: Not on file  Intimate Partner Violence: Not on file   ROS-see HPI  + = positive Constitutional:    weight loss, night sweats, fevers, chills, fatigue, lassitude. HEENT:    headaches, difficulty swallowing, +tooth/dental problems, sore throat,       sneezing, itching, ear ache, nasal congestion, post nasal drip, snoring CV:    +chest pain, orthopnea, PND, swelling in lower extremities, anasarca,              dizziness, palpitations Resp:   +shortness of breath with exertion or at rest.                +productive cough,   non-productive cough, coughing up of blood.              change in color of mucus.  wheezing.   Skin:    rash or lesions. GI: +heartburn, indigestion, abdominal pain, nausea, vomiting, diarrhea,                 change in bowel habits, loss  of appetite GU: dysuria, change in color of urine, no urgency or frequency.   flank pain. MS:   +joint pain, stiffness, decreased range of motion, back pain. Neuro-     nothing unusual Psych:  change in mood or affect.  +depression or anxiety.   memory loss.   OBJ- Physical Exam General- Alert, Oriented, Affect-appropriate, Distress- none acute, +thin, +cane Skin- rash-none, lesions- none, excoriation- none Lymphadenopathy- none Head- atraumatic, +speech slurred            Eyes- Gross vision intact, PERRLA, conjunctivae and secretions clear            Ears- Hearing, canals-normal            Nose- Clear, no-Septal dev, mucus, polyps, erosion, perforation             Throat- Mallampati III , mucosa clear , drainage- none, tonsils- atrophic, +teeth Neck- flexible , trachea midline, no stridor , thyroid nl, carotid no bruit Chest - symmetrical excursion , unlabored           Heart/CV- RRR , no murmur , no gallop  , no rub, nl s1 s2                           - JVD- none , edema- none, stasis changes- none, varices- none           Lung- clear to P&A, wheeze- none, cough- none , dullness-none, rub- none           Chest wall-  Abd-  Br/ Gen/ Rectal- Not done, not indicated Extrem- cyanosis- none, clubbing, none, atrophy- none, strength- nl Neuro- grossly intact to observation

## 2023-02-19 ENCOUNTER — Encounter: Payer: Self-pay | Admitting: Internal Medicine

## 2023-02-19 ENCOUNTER — Ambulatory Visit (INDEPENDENT_AMBULATORY_CARE_PROVIDER_SITE_OTHER): Payer: 59 | Admitting: Internal Medicine

## 2023-02-19 VITALS — BP 100/70 | HR 71 | Temp 97.4°F | Ht 63.0 in | Wt 140.8 lb

## 2023-02-19 DIAGNOSIS — I63033 Cerebral infarction due to thrombosis of bilateral carotid arteries: Secondary | ICD-10-CM | POA: Diagnosis not present

## 2023-02-19 DIAGNOSIS — G4700 Insomnia, unspecified: Secondary | ICD-10-CM | POA: Insufficient documentation

## 2023-02-19 DIAGNOSIS — F5101 Primary insomnia: Secondary | ICD-10-CM

## 2023-02-19 DIAGNOSIS — G4731 Primary central sleep apnea: Secondary | ICD-10-CM | POA: Insufficient documentation

## 2023-02-19 MED ORDER — TRAZODONE HCL 50 MG PO TABS: ORAL_TABLET | ORAL | 2 refills | Status: DC

## 2023-02-19 NOTE — Progress Notes (Signed)
Received Covid booster in 2021.

## 2023-02-19 NOTE — Assessment & Plan Note (Signed)
Seems somewhat slow and inarticulate. Don't know if this is secondary to the CVA. Hopefully DME will work with him to make BIPAP successful.

## 2023-02-19 NOTE — Patient Instructions (Signed)
Order- new DME (lives in [place])  new BIPAP ST 17/12, back up rate 16 Dx Complex Sleep Apnea  Script sent to [facility] to try trazodone 50 mg, 1 at bedtime as sneeded to help you sleep

## 2023-02-19 NOTE — Assessment & Plan Note (Signed)
Unclear why he never got a PAP machine after his sleep study. Agree with study recommending BIPAP ST to address central apneas. Plan- new DME, new BIPAP ST 17/12, back up rate 16

## 2023-02-19 NOTE — Assessment & Plan Note (Signed)
Discussed trial of trazodone, anticipating it might help with initial BIPAP tolerance Lan trazodone for sleep if needed

## 2023-02-22 ENCOUNTER — Ambulatory Visit: Payer: 59 | Admitting: Physical Therapy

## 2023-02-22 ENCOUNTER — Other Ambulatory Visit: Payer: Self-pay | Admitting: Cardiology

## 2023-02-22 ENCOUNTER — Ambulatory Visit: Payer: 59 | Attending: Physical Medicine and Rehabilitation | Admitting: Speech Pathology

## 2023-02-22 DIAGNOSIS — I2510 Atherosclerotic heart disease of native coronary artery without angina pectoris: Secondary | ICD-10-CM

## 2023-02-22 DIAGNOSIS — E7800 Pure hypercholesterolemia, unspecified: Secondary | ICD-10-CM

## 2023-02-22 DIAGNOSIS — I480 Paroxysmal atrial fibrillation: Secondary | ICD-10-CM

## 2023-02-22 DIAGNOSIS — F1910 Other psychoactive substance abuse, uncomplicated: Secondary | ICD-10-CM

## 2023-02-22 DIAGNOSIS — I10 Essential (primary) hypertension: Secondary | ICD-10-CM

## 2023-02-23 ENCOUNTER — Encounter: Payer: Self-pay | Admitting: Family Medicine

## 2023-03-01 ENCOUNTER — Ambulatory Visit: Payer: 59 | Attending: Cardiology

## 2023-03-01 DIAGNOSIS — I2510 Atherosclerotic heart disease of native coronary artery without angina pectoris: Secondary | ICD-10-CM | POA: Diagnosis not present

## 2023-03-01 DIAGNOSIS — I480 Paroxysmal atrial fibrillation: Secondary | ICD-10-CM

## 2023-03-01 LAB — ECHOCARDIOGRAM COMPLETE
Area-P 1/2: 3.77 cm2
S' Lateral: 3.9 cm

## 2023-03-26 ENCOUNTER — Ambulatory Visit (HOSPITAL_COMMUNITY)
Admission: RE | Admit: 2023-03-26 | Discharge: 2023-03-26 | Disposition: A | Discharge status: Home / Self Care | Payer: 59 | Source: Ambulatory Visit | Attending: Family Medicine | Admitting: Family Medicine

## 2023-03-26 DIAGNOSIS — J984 Other disorders of lung: Secondary | ICD-10-CM | POA: Insufficient documentation

## 2023-03-26 DIAGNOSIS — Z87891 Personal history of nicotine dependence: Secondary | ICD-10-CM | POA: Insufficient documentation

## 2023-03-30 ENCOUNTER — Encounter: Payer: Medicare (Managed Care) | Admitting: Physical Medicine and Rehabilitation

## 2023-04-20 ENCOUNTER — Encounter
Payer: Medicare (Managed Care) | Attending: Physical Medicine and Rehabilitation | Admitting: Physical Medicine and Rehabilitation

## 2023-04-20 VITALS — BP 132/64 | HR 63 | Ht 63.0 in | Wt 138.0 lb

## 2023-04-20 DIAGNOSIS — M25552 Pain in left hip: Secondary | ICD-10-CM | POA: Diagnosis not present

## 2023-04-20 DIAGNOSIS — R531 Weakness: Secondary | ICD-10-CM | POA: Insufficient documentation

## 2023-04-20 DIAGNOSIS — R471 Dysarthria and anarthria: Secondary | ICD-10-CM | POA: Insufficient documentation

## 2023-04-20 DIAGNOSIS — Z79899 Other long term (current) drug therapy: Secondary | ICD-10-CM | POA: Diagnosis not present

## 2023-04-20 DIAGNOSIS — M25551 Pain in right hip: Secondary | ICD-10-CM | POA: Diagnosis not present

## 2023-04-20 DIAGNOSIS — I639 Cerebral infarction, unspecified: Secondary | ICD-10-CM | POA: Diagnosis not present

## 2023-04-20 DIAGNOSIS — R296 Repeated falls: Secondary | ICD-10-CM | POA: Insufficient documentation

## 2023-04-20 DIAGNOSIS — I159 Secondary hypertension, unspecified: Secondary | ICD-10-CM

## 2023-04-20 DIAGNOSIS — G4730 Sleep apnea, unspecified: Secondary | ICD-10-CM | POA: Insufficient documentation

## 2023-04-20 DIAGNOSIS — I10 Essential (primary) hypertension: Secondary | ICD-10-CM | POA: Insufficient documentation

## 2023-04-20 DIAGNOSIS — W19XXXA Unspecified fall, initial encounter: Secondary | ICD-10-CM

## 2023-04-20 MED ORDER — LIDOCAINE 5 % EX PTCH: 2.0000 | MEDICATED_PATCH | CUTANEOUS | 0 refills | Status: DC

## 2023-04-20 NOTE — Progress Notes (Addendum)
Subjective:    Patient ID: Timothy Galvan, male    DOB: 10/12/1955, 67 y.o.   MRN: [id number]  HPI Mr. Martines is a 67 year old man who presents for follow-up of CVA  1) CVA -completed his home therapy -needs refills of some of his medications but is not sure which -he has neurology appointment scheduled  -taking his statin -balance is off -walking better -feels a little weak but that that he is getting better every day  2) HTN -he takes amlodipine, Cozaar, Hydralazine -BP is 132/64 -has been feeling much better with the lower dose of blood pressure medication  3) Bilateral hip pain: -he is limited in his walking due to his pain -gabapentin is not helping -he is trying to stay away from drugs  Pain Inventory Average Pain 0 Pain Right Now 0 My pain is  no pain  LOCATION OF PAIN  no pain  BOWEL Number of stools per week: 14  Oral laxative use No  Type of laxative . Enema or suppository use No  History of colostomy No  Incontinent No   BLADDER Normal In and out cath, frequency . Able to self cath  . Bladder incontinence No  Frequent urination No  Leakage with coughing No  Difficulty starting stream No  Incomplete bladder emptying No    Mobility walk with assistance use a cane use a walker how many minutes can you walk? Not far ability to climb steps?  no do you drive?  no  Function disabled: date disabled .  Neuro/Psych trouble walking  Prior Studies Any changes since last visit?  no  Physicians involved in your care Any changes since last visit?  no   No family history on file. Social History   Socioeconomic History   Marital status: Single    Spouse name: Not on file   Number of children: Not on file   Years of education: Not on file   Highest education level: Not on file  Occupational History   Not on file  Tobacco Use   Smoking status: Every Day    Current packs/day: 1.00    Types: Cigarettes   Smokeless tobacco: Never    Tobacco comments:    Currently smoking 1ppd.  At the most he smoked 2 ppd in the pack.  Trying to quit.  02/19/2023 hfb  Vaping Use   Vaping status: Some Days   Substances: Flavoring  Substance and Sexual Activity   Alcohol use: Yes    Alcohol/week: 12.0 standard drinks of alcohol    Types: 12 Cans of beer per week   Drug use: Yes    Types: Cocaine    Comment: last month   Sexual activity: Not on file  Other Topics Concern   Not on file  Social History Narrative   Not on file   Social Determinants of Health   Financial Resource Strain: Low Risk  (05/29/2021)   Received from [facility], [facility]   Overall Financial Resource Strain (CARDIA)    Difficulty of Paying Living Expenses: Not very hard  Food Insecurity: No Food Insecurity (06/04/2022)   Hunger Vital Sign    Worried About Running Out of Food in the Last Year: Never true    Ran Out of Food in the Last Year: Never true  Transportation Needs: No Transportation Needs (05/29/2021)   Received from [facility], [facility]   [facility] - Transportation    Lack of Transportation (Medical): No  Lack of Transportation (Non-Medical): No  Physical Activity: Not on file  Stress: Not on file  Social Connections: Not on file   Past Surgical History:  Procedure Laterality Date   FEMORAL BYPASS     MANDIBLE SURGERY     WRIST SURGERY     Past Medical History:  Diagnosis Date   Asthma    Avascular necrosis of bone of hip (HCC)    Blood clot in vein    Cancer (HCC)    Carotid stenosis, bilateral    Cerebrovascular accident (CVA), unspecified mechanism (HCC)    Coronary artery disease    GERD (gastroesophageal reflux disease)    Heart disease    Hypertension    PAD (peripheral artery disease) (HCC)    Pneumonia    BP 132/64   Pulse 63   Ht 5\' 3"  (1.6 m)   Wt 138 lb (62.6 kg)   SpO2 95%   BMI 24.45 kg/m   Opioid Risk Score:   Fall Risk Score:  `1  Depression screen [facility] 2/9     11/27/2022     9:33 AM 07/28/2022    3:14 PM  Depression screen PHQ 2/9  Decreased Interest 1 0  Down, Depressed, Hopeless 1 0  PHQ - 2 Score 2 0  Altered sleeping  0  Tired, decreased energy  1  Change in appetite  0  Feeling bad or failure about yourself   0  Trouble concentrating  0  Moving slowly or fidgety/restless  3  Suicidal thoughts  0  PHQ-9 Score  4      Review of Systems  Respiratory:  Positive for shortness of breath.   Musculoskeletal:  Positive for back pain and gait problem.       B/L leg hip pain  All other systems reviewed and are negative.      Objective:   Physical Exam Gen: no distress, normal appearing Gen: no distress, normal appearing HEENT: oral mucosa pink and moist, NCAT Cardio: Reg rate Chest: normal effort, normal rate of breathing Abd: soft, non-distended Ext: no edema Psych: pleasant, normal affect Skin: intact Neuro: Alert and oriented, right sided weakness, ambulates with the cane     Assessment & Plan:   1) CVA -continue HEP -continue lipitor -prescribed physical therapy for balance -would benefit from handicap placard to increase mobility in the community -reviewed all medications and provided necessary refills. -discussed vagal nerve stimulation if strength fails to improve in 6 months.  -encouraged walking outside every day -discussed that it is better to take a methylated multivitamin  2) HTN: -BP reviewed and is stable -discussed that he is having less dizziness since Cozaar has been decreased, discussed that with 50mg   -has cardiology follow-up.  -stop amlodipine -Advised checking BP daily at home and logging results to bring into follow-up appointment with PCP and myself. -Reviewed BP meds today.  -Advised regarding healthy foods that can help lower blood pressure and provided with a list: 1) citrus foods- high in vitamins and minerals 2) salmon and other fatty fish - reduces inflammation and oxylipins 3) swiss chard (leafy green)-  high level of nitrates 4) pumpkin seeds- one of the best natural sources of magnesium 5) Beans and lentils- high in fiber, magnesium, and potassium 6) Berries- high in flavonoids 7) Amaranth (whole grain, can be cooked similarly to rice and oats)- high in magnesium and fiber 8) Pistachios- even more effective at reducing BP than other nuts 9) Carrots- high in phenolic compounds that relax  blood vessels and reduce inflammation 10) Celery- contain phthalides that relax tissues of arterial walls 11) Tomatoes- can also improve cholesterol and reduce risk of heart disease 12) Broccoli- good source of magnesium, calcium, and potassium 13) Greek yogurt: high in potassium and calcium 14) Herbs and spices: Celery seed, cilantro, saffron, lemongrass, black cumin, ginseng, cinnamon, cardamom, sweet basil, and ginger 15) Chia and flax seeds- also help to lower cholesterol and blood sugar 16) Beets- high levels of nitrates that relax blood vessels  17) spinach and bananas- high in potassium  -Provided lise of supplements that can help with hypertension:  1) magnesium: one high quality brand is Bioptemizers since it contains all 7 types of magnesium, otherwise over the counter magnesium gluconate 400mg  is a good option 2) B vitamins 3) vitamin D 4) potassium 5) CoQ10 6) L-arginine 7) Vitamin C 8) Beetroot -Educated that goal BP is 120/80. -Made goal to incorporate some of the above foods into diet.    3) Fall -discussed that he did not suffer any injuries, discussed that he has not had any falls since blood pressure medication decreased -PT ordered -continues to have frequent falls  4) Dysarthria: -SLP ordered  5) Cough: -tessalon ordered  6) Sleep apnea -patient has been unable to get CPAP, will refer to pulmonology  7) Bilateral hip pain: -discussed that it is limiting his ability to walk -lidocaine patches ordered for bilateral hips.

## 2023-04-20 NOTE — Addendum Note (Signed)
Addended by: Horton Chin on: 04/20/2023 01:11 PM   Modules accepted: Orders

## 2023-04-20 NOTE — Addendum Note (Signed)
Addended by: Horton Chin on: 04/20/2023 01:18 PM   Modules accepted: Orders

## 2023-04-21 ENCOUNTER — Other Ambulatory Visit: Payer: Self-pay | Admitting: Physical Medicine and Rehabilitation

## 2023-04-21 ENCOUNTER — Telehealth: Payer: Self-pay | Admitting: Specialist

## 2023-04-21 MED ORDER — LIDOCAINE 5 % EX PTCH: 2.0000 | MEDICATED_PATCH | CUTANEOUS | 0 refills | Status: DC

## 2023-04-21 NOTE — Telephone Encounter (Signed)
[employer] called.  We sent refill to [facility] which is not patients pharmacy.  It needs sent to [facility] pharmacy please.  Please resend.  Shirlean Mylar, MHA, OT/L [phone]

## 2023-04-22 NOTE — Telephone Encounter (Signed)
Attempted to call patient to advise patches have been sent to [facility], unable to leave vm, mailbox is full.

## 2023-04-27 ENCOUNTER — Other Ambulatory Visit: Payer: Self-pay | Admitting: Family Medicine

## 2023-04-27 DIAGNOSIS — J984 Other disorders of lung: Secondary | ICD-10-CM

## 2023-04-30 ENCOUNTER — Ambulatory Visit
Admission: RE | Admit: 2023-04-30 | Discharge: 2023-04-30 | Disposition: A | Discharge status: Home / Self Care | Payer: Medicare (Managed Care) | Source: Ambulatory Visit | Attending: Family Medicine | Admitting: Family Medicine

## 2023-04-30 DIAGNOSIS — J984 Other disorders of lung: Secondary | ICD-10-CM | POA: Diagnosis present

## 2023-04-30 MED ORDER — IOHEXOL 300 MG/ML  SOLN
75.0000 mL | Freq: Once | INTRAMUSCULAR | Status: AC | PRN
  Administered 2023-04-30: 75 mL via INTRAVENOUS

## 2023-05-03 ENCOUNTER — Ambulatory Visit: Payer: Medicare (Managed Care) | Admitting: Cardiology

## 2023-05-14 ENCOUNTER — Inpatient Hospital Stay
Admission: EM | Admit: 2023-05-14 | Discharge: 2023-06-13 | DRG: 853 | Disposition: E | Payer: Medicare Other | Attending: Internal Medicine | Admitting: Internal Medicine

## 2023-05-14 ENCOUNTER — Other Ambulatory Visit: Payer: Self-pay

## 2023-05-14 ENCOUNTER — Encounter: Payer: Self-pay | Admitting: Emergency Medicine

## 2023-05-14 DIAGNOSIS — I471 Supraventricular tachycardia, unspecified: Secondary | ICD-10-CM | POA: Insufficient documentation

## 2023-05-14 DIAGNOSIS — I2510 Atherosclerotic heart disease of native coronary artery without angina pectoris: Secondary | ICD-10-CM | POA: Diagnosis present

## 2023-05-14 DIAGNOSIS — E785 Hyperlipidemia, unspecified: Secondary | ICD-10-CM | POA: Diagnosis present

## 2023-05-14 DIAGNOSIS — R001 Bradycardia, unspecified: Secondary | ICD-10-CM | POA: Diagnosis not present

## 2023-05-14 DIAGNOSIS — E872 Acidosis, unspecified: Secondary | ICD-10-CM | POA: Diagnosis not present

## 2023-05-14 DIAGNOSIS — I70263 Atherosclerosis of native arteries of extremities with gangrene, bilateral legs: Secondary | ICD-10-CM | POA: Diagnosis not present

## 2023-05-14 DIAGNOSIS — M6282 Rhabdomyolysis: Secondary | ICD-10-CM | POA: Diagnosis not present

## 2023-05-14 DIAGNOSIS — I110 Hypertensive heart disease with heart failure: Secondary | ICD-10-CM | POA: Diagnosis present

## 2023-05-14 DIAGNOSIS — I4719 Other supraventricular tachycardia: Secondary | ICD-10-CM | POA: Diagnosis not present

## 2023-05-14 DIAGNOSIS — I480 Paroxysmal atrial fibrillation: Secondary | ICD-10-CM | POA: Insufficient documentation

## 2023-05-14 DIAGNOSIS — Z79899 Other long term (current) drug therapy: Secondary | ICD-10-CM

## 2023-05-14 DIAGNOSIS — J95821 Acute postprocedural respiratory failure: Secondary | ICD-10-CM | POA: Diagnosis not present

## 2023-05-14 DIAGNOSIS — E875 Hyperkalemia: Secondary | ICD-10-CM | POA: Diagnosis not present

## 2023-05-14 DIAGNOSIS — A480 Gas gangrene: Secondary | ICD-10-CM | POA: Diagnosis not present

## 2023-05-14 DIAGNOSIS — F17210 Nicotine dependence, cigarettes, uncomplicated: Secondary | ICD-10-CM | POA: Diagnosis present

## 2023-05-14 DIAGNOSIS — Z515 Encounter for palliative care: Secondary | ICD-10-CM

## 2023-05-14 DIAGNOSIS — F17200 Nicotine dependence, unspecified, uncomplicated: Secondary | ICD-10-CM | POA: Insufficient documentation

## 2023-05-14 DIAGNOSIS — Z66 Do not resuscitate: Secondary | ICD-10-CM | POA: Diagnosis not present

## 2023-05-14 DIAGNOSIS — D649 Anemia, unspecified: Secondary | ICD-10-CM | POA: Diagnosis not present

## 2023-05-14 DIAGNOSIS — N179 Acute kidney failure, unspecified: Secondary | ICD-10-CM | POA: Diagnosis not present

## 2023-05-14 DIAGNOSIS — R6521 Severe sepsis with septic shock: Secondary | ICD-10-CM | POA: Diagnosis present

## 2023-05-14 DIAGNOSIS — M879 Osteonecrosis, unspecified: Secondary | ICD-10-CM | POA: Diagnosis present

## 2023-05-14 DIAGNOSIS — J4489 Other specified chronic obstructive pulmonary disease: Secondary | ICD-10-CM | POA: Diagnosis present

## 2023-05-14 DIAGNOSIS — Z85038 Personal history of other malignant neoplasm of large intestine: Secondary | ICD-10-CM

## 2023-05-14 DIAGNOSIS — I700 Atherosclerosis of aorta: Secondary | ICD-10-CM | POA: Diagnosis present

## 2023-05-14 DIAGNOSIS — I7409 Other arterial embolism and thrombosis of abdominal aorta: Secondary | ICD-10-CM | POA: Diagnosis present

## 2023-05-14 DIAGNOSIS — Z7982 Long term (current) use of aspirin: Secondary | ICD-10-CM

## 2023-05-14 DIAGNOSIS — T82868A Thrombosis of vascular prosthetic devices, implants and grafts, initial encounter: Secondary | ICD-10-CM | POA: Diagnosis present

## 2023-05-14 DIAGNOSIS — Z880 Allergy status to penicillin: Secondary | ICD-10-CM

## 2023-05-14 DIAGNOSIS — E162 Hypoglycemia, unspecified: Secondary | ICD-10-CM | POA: Diagnosis not present

## 2023-05-14 DIAGNOSIS — F17290 Nicotine dependence, other tobacco product, uncomplicated: Secondary | ICD-10-CM | POA: Diagnosis present

## 2023-05-14 DIAGNOSIS — I4940 Unspecified premature depolarization: Secondary | ICD-10-CM | POA: Diagnosis not present

## 2023-05-14 DIAGNOSIS — I5042 Chronic combined systolic (congestive) and diastolic (congestive) heart failure: Secondary | ICD-10-CM | POA: Diagnosis present

## 2023-05-14 DIAGNOSIS — Z7902 Long term (current) use of antithrombotics/antiplatelets: Secondary | ICD-10-CM

## 2023-05-14 DIAGNOSIS — I214 Non-ST elevation (NSTEMI) myocardial infarction: Secondary | ICD-10-CM

## 2023-05-14 DIAGNOSIS — J449 Chronic obstructive pulmonary disease, unspecified: Secondary | ICD-10-CM | POA: Diagnosis present

## 2023-05-14 DIAGNOSIS — K219 Gastro-esophageal reflux disease without esophagitis: Secondary | ICD-10-CM | POA: Diagnosis present

## 2023-05-14 DIAGNOSIS — T405X1A Poisoning by cocaine, accidental (unintentional), initial encounter: Secondary | ICD-10-CM | POA: Diagnosis present

## 2023-05-14 DIAGNOSIS — Z833 Family history of diabetes mellitus: Secondary | ICD-10-CM

## 2023-05-14 DIAGNOSIS — K7200 Acute and subacute hepatic failure without coma: Secondary | ICD-10-CM | POA: Diagnosis not present

## 2023-05-14 DIAGNOSIS — I462 Cardiac arrest due to underlying cardiac condition: Secondary | ICD-10-CM | POA: Diagnosis not present

## 2023-05-14 DIAGNOSIS — M87059 Idiopathic aseptic necrosis of unspecified femur: Secondary | ICD-10-CM | POA: Insufficient documentation

## 2023-05-14 DIAGNOSIS — I469 Cardiac arrest, cause unspecified: Secondary | ICD-10-CM

## 2023-05-14 DIAGNOSIS — F10139 Alcohol abuse with withdrawal, unspecified: Secondary | ICD-10-CM | POA: Diagnosis not present

## 2023-05-14 DIAGNOSIS — I10 Essential (primary) hypertension: Secondary | ICD-10-CM | POA: Diagnosis present

## 2023-05-14 DIAGNOSIS — I442 Atrioventricular block, complete: Secondary | ICD-10-CM | POA: Diagnosis not present

## 2023-05-14 DIAGNOSIS — Z8673 Personal history of transient ischemic attack (TIA), and cerebral infarction without residual deficits: Secondary | ICD-10-CM

## 2023-05-14 DIAGNOSIS — I428 Other cardiomyopathies: Secondary | ICD-10-CM | POA: Diagnosis present

## 2023-05-14 DIAGNOSIS — M79671 Pain in right foot: Secondary | ICD-10-CM | POA: Diagnosis not present

## 2023-05-14 DIAGNOSIS — I998 Other disorder of circulatory system: Principal | ICD-10-CM | POA: Diagnosis present

## 2023-05-14 DIAGNOSIS — G928 Other toxic encephalopathy: Secondary | ICD-10-CM | POA: Diagnosis present

## 2023-05-14 DIAGNOSIS — Z7984 Long term (current) use of oral hypoglycemic drugs: Secondary | ICD-10-CM

## 2023-05-14 DIAGNOSIS — F1410 Cocaine abuse, uncomplicated: Secondary | ICD-10-CM | POA: Diagnosis present

## 2023-05-14 DIAGNOSIS — Z8249 Family history of ischemic heart disease and other diseases of the circulatory system: Secondary | ICD-10-CM

## 2023-05-14 DIAGNOSIS — I4892 Unspecified atrial flutter: Secondary | ICD-10-CM | POA: Diagnosis not present

## 2023-05-14 DIAGNOSIS — I255 Ischemic cardiomyopathy: Secondary | ICD-10-CM | POA: Diagnosis present

## 2023-05-14 DIAGNOSIS — Z8711 Personal history of peptic ulcer disease: Secondary | ICD-10-CM

## 2023-05-14 DIAGNOSIS — R0603 Acute respiratory distress: Secondary | ICD-10-CM | POA: Diagnosis not present

## 2023-05-14 DIAGNOSIS — A419 Sepsis, unspecified organism: Secondary | ICD-10-CM | POA: Diagnosis not present

## 2023-05-14 DIAGNOSIS — G9341 Metabolic encephalopathy: Secondary | ICD-10-CM | POA: Diagnosis not present

## 2023-05-14 DIAGNOSIS — D696 Thrombocytopenia, unspecified: Secondary | ICD-10-CM | POA: Diagnosis not present

## 2023-05-14 DIAGNOSIS — Z9889 Other specified postprocedural states: Secondary | ICD-10-CM

## 2023-05-14 DIAGNOSIS — I739 Peripheral vascular disease, unspecified: Secondary | ICD-10-CM | POA: Diagnosis present

## 2023-05-14 DIAGNOSIS — I70223 Atherosclerosis of native arteries of extremities with rest pain, bilateral legs: Secondary | ICD-10-CM | POA: Diagnosis present

## 2023-05-14 DIAGNOSIS — Z7901 Long term (current) use of anticoagulants: Secondary | ICD-10-CM

## 2023-05-14 DIAGNOSIS — F1090 Alcohol use, unspecified, uncomplicated: Secondary | ICD-10-CM | POA: Insufficient documentation

## 2023-05-14 MED ORDER — MORPHINE SULFATE (PF) 4 MG/ML IV SOLN
4.0000 mg | Freq: Once | INTRAVENOUS | Status: AC
  Administered 2023-05-15: 4 mg via INTRAVENOUS
  Filled 2023-05-14: qty 1

## 2023-05-14 MED ORDER — OXYCODONE-ACETAMINOPHEN 5-325 MG PO TABS: 1.0000 | ORAL_TABLET | ORAL | Status: DC | PRN

## 2023-05-14 MED ORDER — ONDANSETRON HCL 4 MG/2ML IJ SOLN
4.0000 mg | INTRAMUSCULAR | Status: AC
  Administered 2023-05-15: 4 mg via INTRAVENOUS
  Filled 2023-05-14: qty 2

## 2023-05-14 NOTE — ED Triage Notes (Addendum)
Pt reports right leg pain, foot pain, and today started to feel numb and cold about an hour ago.  Removed patient's pants in triage.  Has cap refill to right great toe but unable to manually palpate a pulse in that foot.

## 2023-05-14 NOTE — ED Notes (Signed)
Patient to ED waiting room via wheelchair by [facility] from home.  Per EMS patient complains that right leg went numb and right foot went cold.  Patient usually uses walker at home, but was unable to use it at that time.  155/97, hr 82, cbg 106, pulse oxi 97% on O2 at 3 liters, was 92% on room air on EMS arrival.

## 2023-05-15 ENCOUNTER — Inpatient Hospital Stay: Payer: Medicare Other

## 2023-05-15 ENCOUNTER — Inpatient Hospital Stay: Payer: Medicare Other | Admitting: Anesthesiology

## 2023-05-15 ENCOUNTER — Other Ambulatory Visit: Payer: Self-pay

## 2023-05-15 ENCOUNTER — Encounter: Admission: EM | Disposition: E | Payer: Self-pay | Source: Home / Self Care | Attending: Internal Medicine

## 2023-05-15 ENCOUNTER — Emergency Department: Payer: Medicare Other

## 2023-05-15 DIAGNOSIS — T82868A Thrombosis of vascular prosthetic devices, implants and grafts, initial encounter: Secondary | ICD-10-CM | POA: Diagnosis not present

## 2023-05-15 DIAGNOSIS — I70263 Atherosclerosis of native arteries of extremities with gangrene, bilateral legs: Secondary | ICD-10-CM | POA: Diagnosis not present

## 2023-05-15 DIAGNOSIS — I7409 Other arterial embolism and thrombosis of abdominal aorta: Secondary | ICD-10-CM | POA: Diagnosis present

## 2023-05-15 DIAGNOSIS — F10139 Alcohol abuse with withdrawal, unspecified: Secondary | ICD-10-CM | POA: Diagnosis not present

## 2023-05-15 DIAGNOSIS — A480 Gas gangrene: Secondary | ICD-10-CM | POA: Diagnosis not present

## 2023-05-15 DIAGNOSIS — F17200 Nicotine dependence, unspecified, uncomplicated: Secondary | ICD-10-CM | POA: Insufficient documentation

## 2023-05-15 DIAGNOSIS — J95821 Acute postprocedural respiratory failure: Secondary | ICD-10-CM | POA: Diagnosis not present

## 2023-05-15 DIAGNOSIS — I442 Atrioventricular block, complete: Secondary | ICD-10-CM | POA: Diagnosis not present

## 2023-05-15 DIAGNOSIS — Z8673 Personal history of transient ischemic attack (TIA), and cerebral infarction without residual deficits: Secondary | ICD-10-CM

## 2023-05-15 DIAGNOSIS — I70229 Atherosclerosis of native arteries of extremities with rest pain, unspecified extremity: Secondary | ICD-10-CM | POA: Diagnosis not present

## 2023-05-15 DIAGNOSIS — M6282 Rhabdomyolysis: Secondary | ICD-10-CM | POA: Diagnosis not present

## 2023-05-15 DIAGNOSIS — Z66 Do not resuscitate: Secondary | ICD-10-CM | POA: Diagnosis not present

## 2023-05-15 DIAGNOSIS — I428 Other cardiomyopathies: Secondary | ICD-10-CM | POA: Diagnosis present

## 2023-05-15 DIAGNOSIS — Z9889 Other specified postprocedural states: Secondary | ICD-10-CM

## 2023-05-15 DIAGNOSIS — J4489 Other specified chronic obstructive pulmonary disease: Secondary | ICD-10-CM | POA: Diagnosis present

## 2023-05-15 DIAGNOSIS — M87052 Idiopathic aseptic necrosis of left femur: Secondary | ICD-10-CM | POA: Diagnosis not present

## 2023-05-15 DIAGNOSIS — I5042 Chronic combined systolic (congestive) and diastolic (congestive) heart failure: Secondary | ICD-10-CM | POA: Diagnosis present

## 2023-05-15 DIAGNOSIS — I110 Hypertensive heart disease with heart failure: Secondary | ICD-10-CM | POA: Diagnosis present

## 2023-05-15 DIAGNOSIS — F1090 Alcohol use, unspecified, uncomplicated: Secondary | ICD-10-CM | POA: Diagnosis not present

## 2023-05-15 DIAGNOSIS — I700 Atherosclerosis of aorta: Secondary | ICD-10-CM | POA: Diagnosis not present

## 2023-05-15 DIAGNOSIS — J42 Unspecified chronic bronchitis: Secondary | ICD-10-CM | POA: Diagnosis not present

## 2023-05-15 DIAGNOSIS — I462 Cardiac arrest due to underlying cardiac condition: Secondary | ICD-10-CM | POA: Diagnosis not present

## 2023-05-15 DIAGNOSIS — M79671 Pain in right foot: Secondary | ICD-10-CM | POA: Diagnosis present

## 2023-05-15 DIAGNOSIS — I214 Non-ST elevation (NSTEMI) myocardial infarction: Secondary | ICD-10-CM | POA: Diagnosis not present

## 2023-05-15 DIAGNOSIS — I471 Supraventricular tachycardia, unspecified: Secondary | ICD-10-CM | POA: Diagnosis not present

## 2023-05-15 DIAGNOSIS — R6521 Severe sepsis with septic shock: Secondary | ICD-10-CM | POA: Diagnosis present

## 2023-05-15 DIAGNOSIS — Z7982 Long term (current) use of aspirin: Secondary | ICD-10-CM

## 2023-05-15 DIAGNOSIS — I480 Paroxysmal atrial fibrillation: Secondary | ICD-10-CM | POA: Diagnosis not present

## 2023-05-15 DIAGNOSIS — Z79899 Other long term (current) drug therapy: Secondary | ICD-10-CM

## 2023-05-15 DIAGNOSIS — I70221 Atherosclerosis of native arteries of extremities with rest pain, right leg: Secondary | ICD-10-CM | POA: Diagnosis not present

## 2023-05-15 DIAGNOSIS — G9341 Metabolic encephalopathy: Secondary | ICD-10-CM | POA: Diagnosis not present

## 2023-05-15 DIAGNOSIS — I708 Atherosclerosis of other arteries: Secondary | ICD-10-CM | POA: Diagnosis not present

## 2023-05-15 DIAGNOSIS — I998 Other disorder of circulatory system: Principal | ICD-10-CM | POA: Diagnosis present

## 2023-05-15 DIAGNOSIS — I70201 Unspecified atherosclerosis of native arteries of extremities, right leg: Secondary | ICD-10-CM

## 2023-05-15 DIAGNOSIS — M879 Osteonecrosis, unspecified: Secondary | ICD-10-CM | POA: Diagnosis present

## 2023-05-15 DIAGNOSIS — F17210 Nicotine dependence, cigarettes, uncomplicated: Secondary | ICD-10-CM

## 2023-05-15 DIAGNOSIS — F1410 Cocaine abuse, uncomplicated: Secondary | ICD-10-CM | POA: Diagnosis present

## 2023-05-15 DIAGNOSIS — G9340 Encephalopathy, unspecified: Secondary | ICD-10-CM

## 2023-05-15 DIAGNOSIS — Z7984 Long term (current) use of oral hypoglycemic drugs: Secondary | ICD-10-CM

## 2023-05-15 DIAGNOSIS — Z01810 Encounter for preprocedural cardiovascular examination: Secondary | ICD-10-CM | POA: Diagnosis not present

## 2023-05-15 DIAGNOSIS — I739 Peripheral vascular disease, unspecified: Secondary | ICD-10-CM

## 2023-05-15 DIAGNOSIS — A419 Sepsis, unspecified organism: Secondary | ICD-10-CM | POA: Diagnosis present

## 2023-05-15 DIAGNOSIS — I4892 Unspecified atrial flutter: Secondary | ICD-10-CM | POA: Diagnosis not present

## 2023-05-15 DIAGNOSIS — K7200 Acute and subacute hepatic failure without coma: Secondary | ICD-10-CM | POA: Diagnosis not present

## 2023-05-15 DIAGNOSIS — M87059 Idiopathic aseptic necrosis of unspecified femur: Secondary | ICD-10-CM | POA: Insufficient documentation

## 2023-05-15 DIAGNOSIS — R7989 Other specified abnormal findings of blood chemistry: Secondary | ICD-10-CM | POA: Diagnosis not present

## 2023-05-15 DIAGNOSIS — I469 Cardiac arrest, cause unspecified: Secondary | ICD-10-CM | POA: Diagnosis not present

## 2023-05-15 DIAGNOSIS — D696 Thrombocytopenia, unspecified: Secondary | ICD-10-CM | POA: Diagnosis not present

## 2023-05-15 DIAGNOSIS — Z515 Encounter for palliative care: Secondary | ICD-10-CM | POA: Diagnosis not present

## 2023-05-15 DIAGNOSIS — G928 Other toxic encephalopathy: Secondary | ICD-10-CM | POA: Diagnosis present

## 2023-05-15 DIAGNOSIS — Z95828 Presence of other vascular implants and grafts: Secondary | ICD-10-CM | POA: Diagnosis not present

## 2023-05-15 DIAGNOSIS — I2510 Atherosclerotic heart disease of native coronary artery without angina pectoris: Secondary | ICD-10-CM | POA: Diagnosis not present

## 2023-05-15 HISTORY — PX: AXILLARY-FEMORAL BYPASS GRAFT: SHX894

## 2023-05-15 HISTORY — PX: THIGH FASCIOTOMY: SHX6718

## 2023-05-15 HISTORY — PX: APPLICATION OF WOUND VAC: SHX5189

## 2023-05-15 LAB — CBC WITH DIFFERENTIAL/PLATELET
Abs Immature Granulocytes: 0.04 10*3/uL (ref 0.00–0.07)
Basophils Absolute: 0 10*3/uL (ref 0.0–0.1)
Basophils Relative: 0 %
Eosinophils Absolute: 0 10*3/uL (ref 0.0–0.5)
Eosinophils Relative: 0 %
HCT: 48.2 % (ref 39.0–52.0)
Hemoglobin: 15.9 g/dL (ref 13.0–17.0)
Immature Granulocytes: 0 %
Lymphocytes Relative: 32 %
Lymphs Abs: 3.1 10*3/uL (ref 0.7–4.0)
MCH: 29.9 pg (ref 26.0–34.0)
MCHC: 33 g/dL (ref 30.0–36.0)
MCV: 90.8 fL (ref 80.0–100.0)
Monocytes Absolute: 0.7 10*3/uL (ref 0.1–1.0)
Monocytes Relative: 7 %
Neutro Abs: 5.8 10*3/uL (ref 1.7–7.7)
Neutrophils Relative %: 61 %
Platelets: 183 10*3/uL (ref 150–400)
RBC: 5.31 MIL/uL (ref 4.22–5.81)
RDW: 14 % (ref 11.5–15.5)
WBC: 9.7 10*3/uL (ref 4.0–10.5)
nRBC: 0 % (ref 0.0–0.2)

## 2023-05-15 LAB — URINE DRUG SCREEN, QUALITATIVE (ARMC ONLY)
Amphetamines, Ur Screen: NOT DETECTED
Barbiturates, Ur Screen: NOT DETECTED
Benzodiazepine, Ur Scrn: POSITIVE — AB
Cannabinoid 50 Ng, Ur ~~LOC~~: NOT DETECTED
Cocaine Metabolite,Ur ~~LOC~~: POSITIVE — AB
MDMA (Ecstasy)Ur Screen: NOT DETECTED
Methadone Scn, Ur: NOT DETECTED
Opiate, Ur Screen: POSITIVE — AB
Phencyclidine (PCP) Ur S: NOT DETECTED
Tricyclic, Ur Screen: NOT DETECTED

## 2023-05-15 LAB — BASIC METABOLIC PANEL
Anion gap: 10 (ref 5–15)
BUN: 20 mg/dL (ref 8–23)
CO2: 22 mmol/L (ref 22–32)
Calcium: 10.1 mg/dL (ref 8.9–10.3)
Chloride: 106 mmol/L (ref 98–111)
Creatinine, Ser: 1.14 mg/dL (ref 0.61–1.24)
GFR, Estimated: >60 mL/min (ref >60)
Glucose, Bld: 113 mg/dL — ABNORMAL HIGH (ref 70–99)
Potassium: 4.4 mmol/L (ref 3.5–5.1)
Sodium: 138 mmol/L (ref 135–145)

## 2023-05-15 LAB — APTT
aPTT: 147 s — ABNORMAL HIGH (ref 24–36)
aPTT: 29 s (ref 24–36)

## 2023-05-15 LAB — TYPE AND SCREEN
ABO/RH(D): O POS
Antibody Screen: NEGATIVE

## 2023-05-15 LAB — HEPARIN LEVEL (UNFRACTIONATED)
Heparin Unfractionated: 0.1 [IU]/mL — ABNORMAL LOW (ref 0.30–0.70)
Heparin Unfractionated: >1.1 [IU]/mL — ABNORMAL HIGH (ref 0.30–0.70)

## 2023-05-15 LAB — GLUCOSE, CAPILLARY: Glucose-Capillary: 120 mg/dL — ABNORMAL HIGH (ref 70–99)

## 2023-05-15 LAB — PROTIME-INR
INR: 1.1 (ref 0.8–1.2)
Prothrombin Time: 14.2 s (ref 11.4–15.2)

## 2023-05-15 LAB — MRSA NEXT GEN BY PCR, NASAL: MRSA by PCR Next Gen: NOT DETECTED

## 2023-05-15 LAB — HIV ANTIBODY (ROUTINE TESTING W REFLEX): HIV Screen 4th Generation wRfx: NONREACTIVE

## 2023-05-15 LAB — MAGNESIUM: Magnesium: 2.1 mg/dL (ref 1.7–2.4)

## 2023-05-15 SURGERY — CREATION, BYPASS, ARTERIAL, AXILLARY TO BILATERAL FEMORAL, USING GRAFT
Anesthesia: General | Site: Leg Lower | Laterality: Right

## 2023-05-15 MED ORDER — MIDAZOLAM HCL 2 MG/2ML IJ SOLN
INTRAMUSCULAR | Status: DC | PRN
  Administered 2023-05-15 (×2): 2 mg via INTRAVENOUS

## 2023-05-15 MED ORDER — HYDROCODONE-ACETAMINOPHEN 5-325 MG PO TABS
1.0000 | ORAL_TABLET | ORAL | Status: DC | PRN
Start: 2023-05-15 — End: 2023-05-18
  Filled 2023-05-15: qty 1

## 2023-05-15 MED ORDER — FENTANYL CITRATE (PF) 100 MCG/2ML IJ SOLN
25.0000 ug | INTRAMUSCULAR | Status: DC | PRN
Start: 2023-05-15 — End: 2023-05-15
  Administered 2023-05-15 (×4): 50 ug via INTRAVENOUS

## 2023-05-15 MED ORDER — ACETAMINOPHEN 325 MG PO TABS
650.0000 mg | ORAL_TABLET | Freq: Four times a day (QID) | ORAL | Status: DC | PRN
Start: 2023-05-15 — End: 2023-05-18

## 2023-05-15 MED ORDER — FENTANYL CITRATE (PF) 100 MCG/2ML IJ SOLN
INTRAMUSCULAR | Status: AC
  Filled 2023-05-15: qty 2

## 2023-05-15 MED ORDER — PROTAMINE SULFATE 10 MG/ML IV SOLN
INTRAVENOUS | Status: AC
  Filled 2023-05-15: qty 25

## 2023-05-15 MED ORDER — SUGAMMADEX SODIUM 200 MG/2ML IV SOLN
INTRAVENOUS | Status: DC | PRN
  Administered 2023-05-15: 250.4 mg via INTRAVENOUS

## 2023-05-15 MED ORDER — DEXAMETHASONE SODIUM PHOSPHATE 10 MG/ML IJ SOLN
INTRAMUSCULAR | Status: DC | PRN
  Administered 2023-05-15: 5 mg via INTRAVENOUS

## 2023-05-15 MED ORDER — NITROGLYCERIN 0.4 MG SL SUBL: 0.4000 mg | SUBLINGUAL_TABLET | SUBLINGUAL | Status: DC | PRN

## 2023-05-15 MED ORDER — MIDAZOLAM HCL 2 MG/2ML IJ SOLN
INTRAMUSCULAR | Status: AC
  Filled 2023-05-15: qty 2

## 2023-05-15 MED ORDER — MAGNESIUM OXIDE 400 MG PO TABS
400.0000 mg | ORAL_TABLET | Freq: Every day | ORAL | Status: DC
  Filled 2023-05-15 (×4): qty 1

## 2023-05-15 MED ORDER — CEFAZOLIN SODIUM-DEXTROSE 2-3 GM-%(50ML) IV SOLR
INTRAVENOUS | Status: DC | PRN
Start: 2023-05-15 — End: 2023-05-15
  Administered 2023-05-15: 2 g via INTRAVENOUS

## 2023-05-15 MED ORDER — HEMOSTATIC AGENTS (NO CHARGE) OPTIME
TOPICAL | Status: DC | PRN
  Administered 2023-05-15: 1 via TOPICAL

## 2023-05-15 MED ORDER — SODIUM CHLORIDE (PF) 0.9 % IJ SOLN
INTRAMUSCULAR | Status: AC
  Filled 2023-05-15: qty 20

## 2023-05-15 MED ORDER — CHLORHEXIDINE GLUCONATE CLOTH 2 % EX PADS
6.0000 | MEDICATED_PAD | Freq: Every day | CUTANEOUS | Status: DC
  Administered 2023-05-15 – 2023-05-18 (×4): 6 via TOPICAL

## 2023-05-15 MED ORDER — PROTAMINE SULFATE 10 MG/ML IV SOLN
INTRAVENOUS | Status: DC | PRN
  Administered 2023-05-15: 50 mg via INTRAVENOUS

## 2023-05-15 MED ORDER — SODIUM CHLORIDE 0.9 % IV SOLN
INTRAVENOUS | Status: DC | PRN
  Administered 2023-05-15: 500 mL

## 2023-05-15 MED ORDER — IPRATROPIUM-ALBUTEROL 20-100 MCG/ACT IN AERS: 1.0000 | INHALATION_SPRAY | Freq: Four times a day (QID) | RESPIRATORY_TRACT | Status: DC | PRN

## 2023-05-15 MED ORDER — ADULT MULTIVITAMIN W/MINERALS CH: 1.0000 | ORAL_TABLET | Freq: Every day | ORAL | Status: DC

## 2023-05-15 MED ORDER — EMPAGLIFLOZIN 10 MG PO TABS
10.0000 mg | ORAL_TABLET | Freq: Every day | ORAL | Status: DC
  Filled 2023-05-15 (×4): qty 1

## 2023-05-15 MED ORDER — HYDROMORPHONE HCL 1 MG/ML IJ SOLN
1.0000 mg | INTRAMUSCULAR | Status: AC
  Administered 2023-05-15: 1 mg via INTRAVENOUS
  Filled 2023-05-15: qty 1

## 2023-05-15 MED ORDER — METOPROLOL TARTRATE 5 MG/5ML IV SOLN
5.0000 mg | Freq: Four times a day (QID) | INTRAVENOUS | Status: DC | PRN
  Administered 2023-05-15 (×2): 5 mg via INTRAVENOUS
  Filled 2023-05-15 (×2): qty 5

## 2023-05-15 MED ORDER — HEPARIN (PORCINE) 25000 UT/250ML-% IV SOLN
950.0000 [IU]/h | INTRAVENOUS | Status: DC
  Administered 2023-05-15: 950 [IU]/h via INTRAVENOUS
  Filled 2023-05-15: qty 250

## 2023-05-15 MED ORDER — ORAL CARE MOUTH RINSE
15.0000 mL | OROMUCOSAL | Status: DC
  Administered 2023-05-15 – 2023-05-19 (×12): 15 mL via OROMUCOSAL

## 2023-05-15 MED ORDER — THIAMINE HCL 100 MG PO TABS: 100.0000 mg | ORAL_TABLET | Freq: Every day | ORAL | Status: DC

## 2023-05-15 MED ORDER — DEXMEDETOMIDINE HCL IN NACL 80 MCG/20ML IV SOLN
INTRAVENOUS | Status: DC | PRN
  Administered 2023-05-15: 8 ug via INTRAVENOUS
  Administered 2023-05-15: 4 ug via INTRAVENOUS
  Administered 2023-05-15 (×2): 8 ug via INTRAVENOUS

## 2023-05-15 MED ORDER — PROPOFOL 10 MG/ML IV BOLUS
INTRAVENOUS | Status: DC | PRN
  Administered 2023-05-15: 60 mg via INTRAVENOUS

## 2023-05-15 MED ORDER — LIDOCAINE HCL (CARDIAC) PF 100 MG/5ML IV SOSY
PREFILLED_SYRINGE | INTRAVENOUS | Status: DC | PRN
  Administered 2023-05-15: 100 mg via INTRAVENOUS

## 2023-05-15 MED ORDER — TRAZODONE HCL 50 MG PO TABS: 50.0000 mg | ORAL_TABLET | Freq: Every evening | ORAL | Status: DC | PRN

## 2023-05-15 MED ORDER — ONDANSETRON HCL 4 MG/2ML IJ SOLN
INTRAMUSCULAR | Status: AC
  Filled 2023-05-15: qty 2

## 2023-05-15 MED ORDER — THIAMINE HCL 100 MG/ML IJ SOLN
100.0000 mg | Freq: Every day | INTRAMUSCULAR | Status: DC
  Administered 2023-05-15 – 2023-05-18 (×4): 100 mg via INTRAVENOUS
  Filled 2023-05-15 (×4): qty 2

## 2023-05-15 MED ORDER — PROPOFOL 10 MG/ML IV BOLUS
INTRAVENOUS | Status: AC
Start: 2023-05-15 — End: ?
  Filled 2023-05-15: qty 20

## 2023-05-15 MED ORDER — LORAZEPAM 2 MG/ML IJ SOLN
1.0000 mg | INTRAMUSCULAR | Status: DC | PRN
  Administered 2023-05-15 – 2023-05-17 (×8): 2 mg via INTRAVENOUS
  Administered 2023-05-17 (×2): 1 mg via INTRAVENOUS
  Administered 2023-05-17: 2 mg via INTRAVENOUS
  Filled 2023-05-15 (×11): qty 1
  Filled 2023-05-15: qty 2

## 2023-05-15 MED ORDER — ONDANSETRON HCL 4 MG/2ML IJ SOLN
INTRAMUSCULAR | Status: DC | PRN
  Administered 2023-05-15: 4 mg via INTRAVENOUS

## 2023-05-15 MED ORDER — HEPARIN SODIUM (PORCINE) 5000 UNIT/ML IJ SOLN
INTRAMUSCULAR | Status: AC
  Filled 2023-05-15: qty 1

## 2023-05-15 MED ORDER — ISOSORBIDE MONONITRATE ER 30 MG PO TB24: 120.0000 mg | ORAL_TABLET | Freq: Every day | ORAL | Status: DC

## 2023-05-15 MED ORDER — PANTOPRAZOLE SODIUM 40 MG PO TBEC: 40.0000 mg | DELAYED_RELEASE_TABLET | Freq: Every day | ORAL | Status: DC

## 2023-05-15 MED ORDER — ROCURONIUM BROMIDE 10 MG/ML (PF) SYRINGE
PREFILLED_SYRINGE | INTRAVENOUS | Status: AC
  Filled 2023-05-15: qty 10

## 2023-05-15 MED ORDER — HEPARIN BOLUS VIA INFUSION
3800.0000 [IU] | Freq: Once | INTRAVENOUS | Status: AC
  Administered 2023-05-15: 3800 [IU] via INTRAVENOUS
  Filled 2023-05-15: qty 3800

## 2023-05-15 MED ORDER — OXYCODONE HCL 5 MG PO TABS: 5.0000 mg | ORAL_TABLET | Freq: Once | ORAL | Status: DC | PRN

## 2023-05-15 MED ORDER — FOLIC ACID 1 MG PO TABS: 1.0000 mg | ORAL_TABLET | Freq: Every day | ORAL | Status: DC

## 2023-05-15 MED ORDER — UMECLIDINIUM BROMIDE 62.5 MCG/ACT IN AEPB
1.0000 | INHALATION_SPRAY | Freq: Every day | RESPIRATORY_TRACT | Status: DC
  Filled 2023-05-15: qty 7

## 2023-05-15 MED ORDER — ACETAMINOPHEN 10 MG/ML IV SOLN
INTRAVENOUS | Status: DC | PRN
  Administered 2023-05-15: 1000 mg via INTRAVENOUS

## 2023-05-15 MED ORDER — LIDOCAINE HCL (PF) 2 % IJ SOLN
INTRAMUSCULAR | Status: AC
  Filled 2023-05-15: qty 5

## 2023-05-15 MED ORDER — CEFAZOLIN SODIUM 1 G IJ SOLR
INTRAMUSCULAR | Status: AC
  Filled 2023-05-15: qty 20

## 2023-05-15 MED ORDER — HEPARIN (PORCINE) 25000 UT/250ML-% IV SOLN
1300.0000 [IU]/h | INTRAVENOUS | Status: DC
  Administered 2023-05-16: 950 [IU]/h via INTRAVENOUS
  Filled 2023-05-15 (×2): qty 250

## 2023-05-15 MED ORDER — DEXAMETHASONE SODIUM PHOSPHATE 10 MG/ML IJ SOLN
INTRAMUSCULAR | Status: AC
Start: 2023-05-15 — End: ?
  Filled 2023-05-15: qty 1

## 2023-05-15 MED ORDER — ROCURONIUM BROMIDE 100 MG/10ML IV SOLN
INTRAVENOUS | Status: DC | PRN
  Administered 2023-05-15: 30 mg via INTRAVENOUS
  Administered 2023-05-15: 20 mg via INTRAVENOUS
  Administered 2023-05-15 (×2): 30 mg via INTRAVENOUS
  Administered 2023-05-15: 20 mg via INTRAVENOUS

## 2023-05-15 MED ORDER — ATORVASTATIN CALCIUM 20 MG PO TABS: 40.0000 mg | ORAL_TABLET | Freq: Every day | ORAL | Status: DC

## 2023-05-15 MED ORDER — SODIUM CHLORIDE 0.9 % IV SOLN: INTRAVENOUS | Status: DC | PRN

## 2023-05-15 MED ORDER — LORAZEPAM 1 MG PO TABS: 1.0000 mg | ORAL_TABLET | ORAL | Status: DC | PRN

## 2023-05-15 MED ORDER — ASPIRIN 81 MG PO TBEC: 81.0000 mg | DELAYED_RELEASE_TABLET | Freq: Every day | ORAL | Status: DC

## 2023-05-15 MED ORDER — LOSARTAN POTASSIUM 50 MG PO TABS: 25.0000 mg | ORAL_TABLET | Freq: Every day | ORAL | Status: DC

## 2023-05-15 MED ORDER — OXYCODONE HCL 5 MG/5ML PO SOLN: 5.0000 mg | Freq: Once | ORAL | Status: DC | PRN

## 2023-05-15 MED ORDER — PROTAMINE SULFATE 10 MG/ML IV SOLN: 50.0000 mg | Freq: Once | INTRAVENOUS | Status: DC

## 2023-05-15 MED ORDER — ONDANSETRON HCL 4 MG PO TABS: 4.0000 mg | ORAL_TABLET | Freq: Four times a day (QID) | ORAL | Status: DC | PRN

## 2023-05-15 MED ORDER — NICOTINE 21 MG/24HR TD PT24
21.0000 mg | MEDICATED_PATCH | Freq: Every day | TRANSDERMAL | Status: DC
  Administered 2023-05-15 – 2023-05-18 (×4): 21 mg via TRANSDERMAL
  Filled 2023-05-15 (×4): qty 1

## 2023-05-15 MED ORDER — ONDANSETRON HCL 4 MG/2ML IJ SOLN: 4.0000 mg | Freq: Four times a day (QID) | INTRAMUSCULAR | Status: DC | PRN

## 2023-05-15 MED ORDER — IOHEXOL 350 MG/ML SOLN
125.0000 mL | Freq: Once | INTRAVENOUS | Status: AC | PRN
  Administered 2023-05-15: 125 mL via INTRAVENOUS

## 2023-05-15 MED ORDER — MELATONIN 5 MG PO TABS: 5.0000 mg | ORAL_TABLET | Freq: Every day | ORAL | Status: DC

## 2023-05-15 MED ORDER — IPRATROPIUM-ALBUTEROL 0.5-2.5 (3) MG/3ML IN SOLN: 3.0000 mL | Freq: Four times a day (QID) | RESPIRATORY_TRACT | Status: DC | PRN

## 2023-05-15 MED ORDER — LACTATED RINGERS IV SOLN: INTRAVENOUS | Status: DC | PRN

## 2023-05-15 MED ORDER — THIAMINE MONONITRATE 100 MG PO TABS: 100.0000 mg | ORAL_TABLET | Freq: Every day | ORAL | Status: DC

## 2023-05-15 MED ORDER — ACETAMINOPHEN 10 MG/ML IV SOLN
INTRAVENOUS | Status: AC
  Filled 2023-05-15: qty 100

## 2023-05-15 MED ORDER — HEPARIN SODIUM (PORCINE) 10000 UNIT/ML IJ SOLN
INTRAMUSCULAR | Status: AC
  Filled 2023-05-15: qty 1

## 2023-05-15 MED ORDER — PHENYLEPHRINE 80 MCG/ML (10ML) SYRINGE FOR IV PUSH (FOR BLOOD PRESSURE SUPPORT)
PREFILLED_SYRINGE | INTRAVENOUS | Status: DC | PRN
  Administered 2023-05-15: 240 ug via INTRAVENOUS

## 2023-05-15 MED ORDER — ACETAMINOPHEN 650 MG RE SUPP: 650.0000 mg | Freq: Four times a day (QID) | RECTAL | Status: DC | PRN

## 2023-05-15 MED ORDER — MORPHINE SULFATE (PF) 2 MG/ML IV SOLN
2.0000 mg | INTRAVENOUS | Status: DC | PRN
  Administered 2023-05-15 – 2023-05-17 (×16): 2 mg via INTRAVENOUS
  Filled 2023-05-15 (×16): qty 1

## 2023-05-15 MED ORDER — FENTANYL CITRATE (PF) 100 MCG/2ML IJ SOLN
INTRAMUSCULAR | Status: DC | PRN
  Administered 2023-05-15 (×4): 50 ug via INTRAVENOUS

## 2023-05-15 MED ORDER — ORAL CARE MOUTH RINSE: 15.0000 mL | OROMUCOSAL | Status: DC | PRN

## 2023-05-15 MED ORDER — HEPARIN SODIUM (PORCINE) 1000 UNIT/ML IJ SOLN
INTRAMUSCULAR | Status: DC | PRN
Start: 2023-05-15 — End: 2023-05-15
  Administered 2023-05-15: 3000 [IU] via INTRAVENOUS
  Administered 2023-05-15: 7000 [IU] via INTRAVENOUS

## 2023-05-15 MED ORDER — HYDRALAZINE HCL 20 MG/ML IJ SOLN
10.0000 mg | Freq: Four times a day (QID) | INTRAMUSCULAR | Status: DC | PRN
  Administered 2023-05-15 – 2023-05-16 (×2): 10 mg via INTRAVENOUS
  Filled 2023-05-15 (×2): qty 1

## 2023-05-15 MED ORDER — ROCURONIUM BROMIDE 10 MG/ML (PF) SYRINGE
PREFILLED_SYRINGE | INTRAVENOUS | Status: AC
Start: 2023-05-15 — End: ?
  Filled 2023-05-15: qty 10

## 2023-05-15 SURGICAL SUPPLY — 74 items
5 PAIRS OF YELLOW SUTURE CLAMP (MISCELLANEOUS) ×2
ADH SKN CLS APL DERMABOND .7 (GAUZE/BANDAGES/DRESSINGS) ×2
APL PRP STRL LF DISP 70% ISPRP (MISCELLANEOUS) ×6
APPLIER CLIP 11 MED OPEN (CLIP) ×2
APR CLP MED 11 20 MLT OPN (CLIP) ×2
Arterial Embolectomy catheter SIZE 4 IMPLANT
BAG DECANTER FOR FLEXI CONT (MISCELLANEOUS) ×2 IMPLANT
BAG ISL LRG 20X20 DRWSTRG (DRAPES) ×2
BAG ISOLATATION DRAPE 20X20 ST (DRAPES) IMPLANT
BLADE SURG SZ11 CARB STEEL (BLADE) ×2 IMPLANT
BRUSH SCRUB EZ 4% CHG (MISCELLANEOUS) ×2 IMPLANT
CANISTER WOUND CARE 500ML ATS (WOUND CARE) IMPLANT
CATH EMB LATEX FREE 4FRX80 (CATHETERS) ×2
CATH EMB LF 4FRX80 (CATHETERS) IMPLANT
CHLORAPREP W/TINT 26 (MISCELLANEOUS) ×4 IMPLANT
CLAMP SUTURE YELLOW 5 PAIRS (MISCELLANEOUS) ×2 IMPLANT
CLIP APPLIE 11 MED OPEN (CLIP) IMPLANT
COVER PROBE FLX POLY STRL (MISCELLANEOUS) IMPLANT
DERMABOND ADVANCED .7 DNX12 (GAUZE/BANDAGES/DRESSINGS) ×4 IMPLANT
DRAPE INCISE IOBAN 66X45 STRL (DRAPES) ×2 IMPLANT
DRESSING SURGICEL FIBRLLR 1X2 (HEMOSTASIS) ×4 IMPLANT
DRSG OPSITE POSTOP 4X6 (GAUZE/BANDAGES/DRESSINGS) ×4 IMPLANT
DRSG SURGICEL FIBRILLAR 1X2 (HEMOSTASIS) ×2
DRSG VAC GRANUFOAM MED (GAUZE/BANDAGES/DRESSINGS) IMPLANT
ELECT CAUTERY BLADE 6.4 (BLADE) ×4 IMPLANT
ELECT REM PT RETURN 9FT ADLT (ELECTROSURGICAL) ×2
ELECTRODE REM PT RTRN 9FT ADLT (ELECTROSURGICAL) ×4 IMPLANT
GAUZE 4X4 16PLY ~~LOC~~+RFID DBL (SPONGE) ×4 IMPLANT
GLOVE BIO SURGEON STRL SZ7 (GLOVE) ×6 IMPLANT
GOWN STRL REUS W/ TWL LRG LVL3 (GOWN DISPOSABLE) ×4 IMPLANT
GOWN STRL REUS W/ TWL XL LVL3 (GOWN DISPOSABLE) ×2 IMPLANT
GOWN STRL REUS W/TWL 2XL LVL3 (GOWN DISPOSABLE) ×2 IMPLANT
GOWN STRL REUS W/TWL LRG LVL3 (GOWN DISPOSABLE) ×4
GOWN STRL REUS W/TWL XL LVL3 (GOWN DISPOSABLE) ×2
GRAFT PROPATEN W/RING 6X80X60 (Vascular Products) IMPLANT
HOLDER FOLEY CATH W/STRAP (MISCELLANEOUS) IMPLANT
IV NS 500ML (IV SOLUTION) ×2
IV NS 500ML BAXH (IV SOLUTION) ×2 IMPLANT
KIT TURNOVER KIT A (KITS) ×2 IMPLANT
LABEL OR SOLS (LABEL) ×2 IMPLANT
LOOP VESSEL MAXI 1X406 RED (MISCELLANEOUS) ×6 IMPLANT
LOOP VESSEL MINI 0.8X406 BLUE (MISCELLANEOUS) ×4 IMPLANT
MANIFOLD NEPTUNE II (INSTRUMENTS) ×2 IMPLANT
NS IRRIG 500ML POUR BTL (IV SOLUTION) ×2 IMPLANT
PACK BASIN MAJOR ARMC (MISCELLANEOUS) ×2 IMPLANT
PACK UNIVERSAL (MISCELLANEOUS) ×2 IMPLANT
PENCIL SMOKE EVACUATOR (MISCELLANEOUS) ×2 IMPLANT
SET WALTER ACTIVATION W/DRAPE (SET/KITS/TRAYS/PACK) ×4 IMPLANT
SPONGE T-LAP 18X18 ~~LOC~~+RFID (SPONGE) ×6 IMPLANT
STAPLER SKIN PROX 35W (STAPLE) IMPLANT
SUT GTX CV-5 TTC13 DBL (SUTURE) ×4 IMPLANT
SUT MNCRL 4-0 (SUTURE) ×4
SUT MNCRL 4-0 27XMFL (SUTURE) ×4
SUT PROLENE 5 0 RB 1 DA (SUTURE) ×4 IMPLANT
SUT PROLENE 6 0 BV (SUTURE) ×8 IMPLANT
SUT PROLENE 7 0 BV 1 (SUTURE) ×4 IMPLANT
SUT SILK 0 SH 30 (SUTURE) ×2 IMPLANT
SUT SILK 2 0 (SUTURE) ×2
SUT SILK 2-0 18XBRD TIE 12 (SUTURE) ×2 IMPLANT
SUT SILK 3 0 (SUTURE) ×2
SUT SILK 3-0 18XBRD TIE 12 (SUTURE) ×2 IMPLANT
SUT SILK 4 0 (SUTURE) ×2
SUT SILK 4-0 18XBRD TIE 12 (SUTURE) ×2 IMPLANT
SUT VIC AB 2-0 CT1 (SUTURE) ×4 IMPLANT
SUT VIC AB 3-0 SH 27 (SUTURE) ×4
SUT VIC AB 3-0 SH 27X BRD (SUTURE) ×4 IMPLANT
SUT VICRYL+ 3-0 36IN CT-1 (SUTURE) ×4 IMPLANT
SUTURE MNCRL 4-0 27XMF (SUTURE) ×4 IMPLANT
SYR 20ML LL LF (SYRINGE) ×2 IMPLANT
SYR BULB IRRIG 60ML STRL (SYRINGE) ×2 IMPLANT
TAG SUTURE CLAMP YLW 5PR (MISCELLANEOUS) ×2
TRAP FLUID SMOKE EVACUATOR (MISCELLANEOUS) ×2 IMPLANT
TRAY FOLEY MTR SLVR 16FR STAT (SET/KITS/TRAYS/PACK) ×2 IMPLANT
WATER STERILE IRR 500ML POUR (IV SOLUTION) ×2 IMPLANT

## 2023-05-15 NOTE — ED Notes (Signed)
Primary RN made aware pt tele monitoring showing HR increased to 160s for approximately 30secs and then maintained in the 140s.

## 2023-05-15 NOTE — H&P (Signed)
History and Physical    Patient: Timothy Galvan:[id number] DOB: 10-10-1955 DOA: 05/31/2023 DOS: the patient was seen and examined on 06/12/2023 PCP: [facility]  Patient coming from: Home  Chief Complaint:  Chief Complaint  Patient presents with   Foot Pain    HPI: Timothy Galvan is a 67 y.o. male with medical history significant for Asthma, CAD, GERD, HTN, tobacco, EtOH and cocaine use, colon cancer, severe PAD with multiple revascularization, stroke 04/2022, who is being admitted with critical limb ischemia.  He presented with severe pain in his right leg going down to the foot that started acutely to where he is unable to bear weight. ED course and data review: BP 185/96, temp 99.4, pulse mostly in the 90s Labs unremarkable CTA aortobifemoral showing acute serial thrombosis as follows: IMPRESSION: VASCULAR  Thrombosis of the infrarenal abdominal aorta and lower extremity inflow bilaterally with reconstitution of lower extremity arterial outflow via posterior collaterals. Superimposed high-grade stenoses at the juncture of the collateral vessels and common femoral arteries bilaterally.  Thrombosis of a fem-fem bypass graft.  Thrombosis of the right superficial femoral artery. Reconstitution of the right popliteal artery via geniculate collaterals.  Thrombosis of the a right anterior tibial and peroneal arteries proximally. Patency of the right posterior tibial artery to the level of the distal diaphysis of the tibia with poor inflow and limited opacification precluding evaluation of distal patency.  Widely patent left lower extremity arterial outflow and runoff. Thrombosis of the left dorsalis pedis artery.   NON-VASCULAR  Avascular necrosis of the femoral heads bilaterally without articular collapse.  Acute to subacute T12 superior endplate fracture.  No retropulsion.  The ED provider spoke with on-call vascular who advised on heparin infusion and may  intervene in the a.m. Hospitalist consulted for admission.   Review of Systems: As mentioned in the history of present illness. All other systems reviewed and are negative.  Past Medical History:  Diagnosis Date   Asthma    Avascular necrosis of bone of hip (HCC)    Blood clot in vein    Cancer (HCC)    Carotid stenosis, bilateral    Cerebrovascular accident (CVA), unspecified mechanism (HCC)    Coronary artery disease    GERD (gastroesophageal reflux disease)    Heart disease    Hypertension    PAD (peripheral artery disease) (HCC)    Pneumonia    Past Surgical History:  Procedure Laterality Date   FEMORAL BYPASS     MANDIBLE SURGERY     WRIST SURGERY     Social History:  reports that he has been smoking cigarettes. He has never used smokeless tobacco. He reports current alcohol use of about 12.0 standard drinks of alcohol per week. He reports current drug use. Drug: Cocaine.  Allergies  Allergen Reactions   Penicillins     Has patient had a PCN reaction causing immediate rash, facial/tongue/throat swelling, SOB or lightheadedness with hypotension: Unknown Has patient had a PCN reaction causing severe rash involving mucus membranes or skin necrosis: Unknown Has patient had a PCN reaction that required hospitalization: Unknown Has patient had a PCN reaction occurring within the last 10 years: Unknown If all of the above answers are "NO", then may proceed with Cephalosporin use.     History reviewed. No pertinent family history.  Prior to Admission medications   Medication Sig Start Date End Date Taking? Authorizing Provider  aspirin EC 81 MG tablet Take 1 tablet (81 mg total) by mouth  daily. Swallow whole. 06/13/22   Love, Evlyn Kanner, PA-C  atorvastatin (LIPITOR) 40 MG tablet Take 1 tablet (40 mg total) by mouth at bedtime. 06/11/22   Love, Evlyn Kanner, PA-C  Blood Pressure Monitoring (BLOOD PRESSURE MONITOR & DTX) KIT 1 each by Does not apply route daily. 09/28/22   [name],  Drema Pry, MD  clopidogrel (PLAVIX) 75 MG tablet Take 1 tablet (75 mg total) by mouth daily. 06/11/22   Love, Evlyn Kanner, PA-C  diclofenac Sodium (VOLTAREN) 1 % GEL Apply 4 g topically 4 (four) times daily. 08/13/22   [provider]  gabapentin (NEURONTIN) 400 MG capsule TAKE 1 CAPSULE BY MOUTH 3 TIMES  DAILY 02/16/23   [name], Drema Pry, MD  Ipratropium-Albuterol (COMBIVENT) 20-100 MCG/ACT AERS respimat Inhale 1 puff into the lungs every 6 (six) hours as needed for wheezing.    [provider]  isosorbide mononitrate (IMDUR) 120 MG 24 hr tablet Take 120 mg by mouth daily. 06/26/22   [provider]  JARDIANCE 10 MG TABS tablet Take 10 mg by mouth daily. 11/05/22   [provider]  lidocaine (LIDODERM) 5 % Place 2 patches onto the skin daily. Remove & Discard patch within 12 hours or as directed by MD 04/21/23   [name], Drema Pry, MD  losartan (COZAAR) 25 MG tablet Take 1 tablet (25 mg total) by mouth daily. 11/27/22   [name], Drema Pry, MD  magnesium oxide (MAG-OX) 400 MG tablet Take 1 tablet (400 mg total) by mouth at bedtime. 06/11/22   Love, Evlyn Kanner, PA-C  Melatonin 5 MG CAPS Take 1 capsule (5 mg total) by mouth daily. 09/28/22   [name], Drema Pry, MD  Multiple Vitamin (MULTIVITAMIN WITH MINERALS) TABS tablet Take 1 tablet by mouth daily. 06/12/22   Love, Evlyn Kanner, PA-C  nitroGLYCERIN (NITROSTAT) 0.4 MG SL tablet Place 1 tablet (0.4 mg total) under the tongue every 5 (five) minutes as needed for chest pain. 02/04/19   Jeanmarie Plant, MD  pantoprazole (PROTONIX) 40 MG tablet Take 1 tablet (40 mg total) by mouth daily. 06/12/22   Love, Evlyn Kanner, PA-C  thiamine (VITAMIN B1) 100 MG tablet Take 1 tablet (100 mg total) by mouth daily. 06/12/22   Love, Evlyn Kanner, PA-C  traZODone (DESYREL) 50 MG tablet 1 at bedtime for sleep 02/19/23   Jetty Duhamel D, MD  umeclidinium bromide (INCRUSE ELLIPTA) 62.5 MCG/ACT AEPB Inhale 1 puff into the lungs daily. 06/12/22   Love, Evlyn Kanner,  PA-C  Vitamin D, Ergocalciferol, (DRISDOL) 1.25 MG (50000 UNIT) CAPS capsule Take 1 capsule (50,000 Units total) by mouth every 7 (seven) days. Patient taking differently: Take 50,000 Units by mouth every 30 (thirty) days. 06/15/22   Jacquelynn Cree, PA-C    Physical Exam: Vitals:   06/12/2023 0108 06/01/2023 0115 05/27/2023 0300 06/08/2023 0409  BP:  (!) 180/153 (!) 178/139 (!) 190/110  Pulse:   93   Resp:  (!) 21 20 18   Temp:    99.4 F (37.4 C)  TempSrc:      SpO2:   93% 97%  Weight: 62.6 kg     Height: 5\' 3"  (1.6 m)      Physical Exam Vitals and nursing note reviewed.  Constitutional:      Comments: Appears uncomfortable from the pain, turning from side-to-side  HENT:     Head: Normocephalic and atraumatic.  Cardiovascular:     Rate and Rhythm: Normal rate and regular rhythm.     Heart sounds: Normal  heart sounds.  Pulmonary:     Effort: Pulmonary effort is normal.     Breath sounds: Normal breath sounds.  Abdominal:     Palpations: Abdomen is soft.     Tenderness: There is no abdominal tenderness.  Neurological:     Mental Status: Mental status is at baseline.     Labs on Admission: I have personally reviewed following labs and imaging studies  CBC: Recent Labs  Lab 05/27/2023 0001  WBC 9.7  NEUTROABS 5.8  HGB 15.9  HCT 48.2  MCV 90.8  PLT 183   Basic Metabolic Panel: Recent Labs  Lab 06/12/2023 0001  NA 138  K 4.4  CL 106  CO2 22  GLUCOSE 113*  BUN 20  CREATININE 1.14  CALCIUM 10.1   GFR: Estimated Creatinine Clearance: 49.2 mL/min (by C-G formula based on SCr of 1.14 mg/dL). Liver Function Tests: No results for input(s): "AST", "ALT", "ALKPHOS", "BILITOT", "PROT", "ALBUMIN" in the last 168 hours. No results for input(s): "LIPASE", "AMYLASE" in the last 168 hours. No results for input(s): "AMMONIA" in the last 168 hours. Coagulation Profile: Recent Labs  Lab 05/23/2023 0001  INR 1.1   Cardiac Enzymes: No results for input(s): "CKTOTAL", "CKMB",  "CKMBINDEX", "TROPONINI" in the last 168 hours. BNP (last 3 results) No results for input(s): "PROBNP" in the last 8760 hours. HbA1C: No results for input(s): "HGBA1C" in the last 72 hours. CBG: No results for input(s): "GLUCAP" in the last 168 hours. Lipid Profile: No results for input(s): "CHOL", "HDL", "LDLCALC", "TRIG", "CHOLHDL", "LDLDIRECT" in the last 72 hours. Thyroid Function Tests: No results for input(s): "TSH", "T4TOTAL", "FREET4", "T3FREE", "THYROIDAB" in the last 72 hours. Anemia Panel: No results for input(s): "VITAMINB12", "FOLATE", "FERRITIN", "TIBC", "IRON", "RETICCTPCT" in the last 72 hours. Urine analysis:    Component Value Date/Time   COLORURINE YELLOW 11/29/2022 1308   APPEARANCEUR CLEAR 11/29/2022 1308   LABSPEC 1.020 11/29/2022 1308   PHURINE 6.0 11/29/2022 1308   GLUCOSEU >=500 (A) 11/29/2022 1308   HGBUR NEGATIVE 11/29/2022 1308   BILIRUBINUR NEGATIVE 11/29/2022 1308   KETONESUR NEGATIVE 11/29/2022 1308   PROTEINUR NEGATIVE 11/29/2022 1308   NITRITE NEGATIVE 11/29/2022 1308   LEUKOCYTESUR NEGATIVE 11/29/2022 1308    Radiological Exams on Admission: CT Angio Aortobifemoral W and/or Wo Contrast  Result Date: 06/04/2023 CLINICAL DATA:  Claudication or leg ischemia. Right leg paresthesia, coolness. EXAM: CT ANGIOGRAPHY OF ABDOMINAL AORTA WITH ILIOFEMORAL RUNOFF TECHNIQUE: Multidetector CT imaging of the abdomen, pelvis and lower extremities was performed using the standard protocol during bolus administration of intravenous contrast. Multiplanar CT image reconstructions and MIPs were obtained to evaluate the vascular anatomy. RADIATION DOSE REDUCTION: This exam was performed according to the departmental dose-optimization program which includes automated exposure control, adjustment of the mA and/or kV according to patient size and/or use of iterative reconstruction technique. CONTRAST:  OMNIPAQUE IOHEXOL 350 MG/ML SOLN COMPARISON:  None Available.  FINDINGS: VASCULAR Aorta: There is thrombosis of the infrarenal abdominal aorta. The suprarenal and juxtarenal abdominal aorta are of normal caliber. No aneurysm or dissection. Celiac: Patent without evidence of aneurysm, dissection, vasculitis or significant stenosis. SMA: Patent without evidence of aneurysm, dissection, vasculitis or significant stenosis. Renals: Both renal arteries are patent without evidence of aneurysm, dissection, vasculitis, fibromuscular dysplasia or significant stenosis. IMA: Thrombosed at its origin. Reconstituted by the marginal artery. Distally widely patent. RIGHT Lower Extremity Inflow: The stented right common iliac and external iliac arteries are thrombosed. The right internal iliac artery is heavily calcified  and chronically thrombosed with reconstitution of the peripheral arterial branches of the internal iliac artery distribution via iliolumbar, rectal arcade and external-internal pudendal collateralization. Outflow: Femoral-femoral bypass graft is thrombosed. Reconstitution of the right common femoral artery via circumflex iliac and inferior epigastric arteries with high-grade stenoses at the point of collateralization. 50% stenosis of the profundus femoral artery at its origin. Distally, this vessel is widely patent. The superficial femoral artery is proximally thrombosed. Runoff: Reconstitution of the popliteal artery via geniculate collaterals the popliteal artery is diminutive, likely related to poor inflow. Scattered calcification involving the terminal popliteal artery and proximal runoff vasculature noted with thrombosis of the anterior tibial and peroneal arteries proximally. The posterior tibial artery appears thrombosed at its origin but quickly reconstitutes. Distal non opacification likely related to suboptimal bolus timing due to poor inflow. LEFT Lower Extremity Inflow: Chronically thrombosed left common and external iliac arteries. The left internal iliac artery is  chronically thrombosed. Collateralization of the distal internal iliac arterial distribution via ileo lumbar, rectal arcade, and internal-external pudendal collaterals. Outflow: Reconstitution of the left common femoral artery via circumflex iliac and inferior epigastric collaterals. High-grade stenosis at the confluence of the inferior epigastric artery and common femoral artery. Fem-fem bypass graft is thrombosed. Profundus femoral and superficial femoral arteries are widely patent. Runoff: There is 50% focal stenosis of the P2 segment of the popliteal artery. There is three-vessel runoff to the left ankle with patency of the plantar arch. The dorsalis pedis artery is not clearly patent. Veins: No obvious venous abnormality within the limitations of this arterial phase study. Review of the MIP images confirms the above findings. NON-VASCULAR Lower chest: No acute abnormality. Hepatobiliary: No focal liver abnormality is seen. No gallstones, gallbladder wall thickening, or biliary dilatation. Pancreas: Unremarkable Spleen: Unremarkable Adrenals/Urinary Tract: Adrenal glands are unremarkable. Kidneys are normal, without renal calculi, focal lesion, or hydronephrosis. Bladder is unremarkable. Stomach/Bowel: Stomach is within normal limits. Appendix appears normal. No evidence of bowel wall thickening, distention, or inflammatory changes. Lymphatic: No pathologic adenopathy within the abdomen and pelvis. Reproductive: Mild prostatic hypertrophy Other: None significant Musculoskeletal: Avascular necrosis of the femoral heads bilaterally without articular collapse. Remote superior endplate fracture of L1. Acute to subacute superior endplate fracture of T12 with mild loss of height and no retropulsion. IMPRESSION: VASCULAR Thrombosis of the infrarenal abdominal aorta and lower extremity inflow bilaterally with reconstitution of lower extremity arterial outflow via posterior collaterals. Superimposed high-grade stenoses  at the juncture of the collateral vessels and common femoral arteries bilaterally. Thrombosis of a fem-fem bypass graft. Thrombosis of the right superficial femoral artery. Reconstitution of the right popliteal artery via geniculate collaterals. Thrombosis of the a right anterior tibial and peroneal arteries proximally. Patency of the right posterior tibial artery to the level of the distal diaphysis of the tibia with poor inflow and limited opacification precluding evaluation of distal patency. Widely patent left lower extremity arterial outflow and runoff. Thrombosis of the left dorsalis pedis artery. 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 Acute to subacute T12 superior endplate fracture.  No retropulsion. Electronically Signed   By: Helyn Numbers M.D.   On: 06/03/2023 02:14     Data Reviewed: Relevant notes from primary care and specialist visits, past discharge summaries as available in EHR, including Care Everywhere. Prior diagnostic testing as pertinent to current admission diagnoses Updated medications and problem lists for reconciliation ED course, including vitals, labs, imaging, treatment and response to treatment Triage notes, nursing and pharmacy notes  and ED provider's notes Notable results as noted in HPI   Assessment and Plan: * Critical ischemia of extremity with history of revascularization of same extremity (HCC) Continue heparin infusion N.p.o. for possible procedure Vascular Dr. Wells Guiles formally consulted Pain control  Alcohol use disorder CIWA withdrawal protocol Follow UDS history of cocaine use  Tobacco use disorder Nicotine patch  History of CVA (cerebrovascular accident) Continue aspirin and statins  Avascular necrosis of bone of hip (HCC) Pain control  HTN (hypertension) Continue antihypertensives  COPD (chronic obstructive pulmonary disease) (HCC) Not acutely exacerbated Continue home inhalers with  DuoNebs as needed  CAD (coronary artery disease) No complaints of chest pain, EKG nonacute continue losartan, isosorbide, aspirin and atorvastatin nitroglycerin        DVT prophylaxis: Heparin fusion  Consults: Vascular  Advance Care Planning:   Code Status: Prior   Family Communication: none  Disposition Plan: Back to previous home environment  Severity of Illness: The appropriate patient status for this patient is INPATIENT. Inpatient status is judged to be reasonable and necessary in order to provide the required intensity of service to ensure the patient's safety. The patient's presenting symptoms, physical exam findings, and initial radiographic and laboratory data in the context of their chronic comorbidities is felt to place them at high risk for further clinical deterioration. Furthermore, it is not anticipated that the patient will be medically stable for discharge from the hospital within 2 midnights of admission.   * I certify that at the point of admission it is my clinical judgment that the patient will require inpatient hospital care spanning beyond 2 midnights from the point of admission due to high intensity of service, high risk for further deterioration and high frequency of surveillance required.*  Author: Andris Baumann, MD 06/10/2023 4:43 AM  For on call review [email or web address].

## 2023-05-15 NOTE — Progress Notes (Signed)
Courtesy note: Patient is seen and examined today morning in the emergency room room 6.  Patient is admitted for critical left limb ischemia.  Vascular surgery consulted.  ED provider requested CT scan due to his altered mentation.  CT reviewed is unremarkable.  Patient is restless, does not answer my questions, apparent distress due to pain.   Patient's family at bedside with whom I spoke regarding vascular procedure.  Patient has severe PAD with multiple revascularization procedures, currently smokes.  I discussed with family regarding his current condition, overall poor prognosis.  Patient will need to be transferred to ICU after the vascular procedure.  Further management as per clinical course.

## 2023-05-15 NOTE — OR Nursing (Signed)
1228 Spoke with daughter Beverely Risen via phone [phone] updated on patient status at this time.

## 2023-05-15 NOTE — Assessment & Plan Note (Signed)
Not acutely exacerbated ?Continue home inhalers with DuoNebs as needed ?

## 2023-05-15 NOTE — ED Notes (Signed)
Pt taken to CT.

## 2023-05-15 NOTE — Assessment & Plan Note (Signed)
Continue antihypertensives. ?

## 2023-05-15 NOTE — Progress Notes (Signed)
Patients HR 147. MD Sreeram notified via secure chat. Metoprolol ordered and given.

## 2023-05-15 NOTE — Assessment & Plan Note (Addendum)
CIWA withdrawal protocol Follow UDS history of cocaine use

## 2023-05-15 NOTE — Anesthesia Preprocedure Evaluation (Addendum)
Anesthesia Evaluation  Patient identified by MRN, date of birth, ID band Patient confused    Reviewed: Allergy & Precautions, H&P , NPO status , Patient's Chart, lab work & pertinent test results  History of Anesthesia Complications Negative for: history of anesthetic complications  Airway Mallampati: III  TM Distance: >3 FB Neck ROM: full    Dental  (+) Chipped   Pulmonary asthma , sleep apnea , COPD,  COPD inhaler, Current Smoker   Pulmonary exam normal        Cardiovascular hypertension, + CAD, + Peripheral Vascular Disease and +CHF  + dysrhythmias Atrial Fibrillation   Echo 03/01/2023 IMPRESSIONS     1. Left ventricular ejection fraction, by estimation, is 45 to 50%. The  left ventricle has mildly decreased function. The left ventricle  demonstrates global hypokinesis. Left ventricular diastolic parameters are  indeterminate.   2. Right ventricular systolic function is normal. The right ventricular  size is normal.   3. Left atrial size was mild to moderately dilated.   4. Right atrial size was mildly dilated.   5. The mitral valve is normal in structure. Mild mitral valve  regurgitation.   6. The aortic valve is tricuspid. Aortic valve regurgitation is not  visualized. Aortic valve sclerosis is present, with no evidence of aortic  valve stenosis.   7. The inferior vena cava is normal in size with greater than 50%  respiratory variability, suggesting right atrial pressure of 3 mmHg.     Neuro/Psych CVA, Residual Symptoms  negative psych ROS   GI/Hepatic ,GERD  ,,(+)     substance abuse  alcohol use and cocaine use  Endo/Other  negative endocrine ROS    Renal/GU      Musculoskeletal   Abdominal   Peds  Hematology negative hematology ROS (+)   Anesthesia Other Findings Past Medical History: No date: Asthma No date: Avascular necrosis of bone of hip (HCC) No date: Blood clot in vein No date: Cancer  (HCC) No date: Carotid stenosis, bilateral No date: Cerebrovascular accident (CVA), unspecified mechanism (HCC) No date: Coronary artery disease No date: GERD (gastroesophageal reflux disease) No date: Heart disease No date: Hypertension No date: PAD (peripheral artery disease) (HCC) No date: Pneumonia  Past Surgical History: No date: FEMORAL BYPASS No date: MANDIBLE SURGERY No date: WRIST SURGERY  BMI    Body Mass Index: 24.45 kg/m      Reproductive/Obstetrics negative OB ROS                             Anesthesia Physical Anesthesia Plan  ASA: 4 and emergent  Anesthesia Plan: General ETT   Post-op Pain Management: Ofirmev IV (intra-op)* and Toradol IV (intra-op)*   Induction: Intravenous  PONV Risk Score and Plan: 2 and Ondansetron, Dexamethasone and Treatment may vary due to age or medical condition  Airway Management Planned: Oral ETT  Additional Equipment:   Intra-op Plan:   Post-operative Plan: Extubation in OR  Informed Consent: I have reviewed the patients History and Physical, chart, labs and discussed the procedure including the risks, benefits and alternatives for the proposed anesthesia with the patient or authorized representative who has indicated his/her understanding and acceptance.     Dental Advisory Given  Plan Discussed with: Anesthesiologist, CRNA and Surgeon  Anesthesia Plan Comments: (Patient consented for risks of anesthesia including but not limited to:  - adverse reactions to medications - damage to eyes, teeth, lips or other oral mucosa -  nerve damage due to positioning  - sore throat or hoarseness - Damage to heart, brain, nerves, lungs, other parts of body or loss of life  Patient voiced understanding and assent.)        Anesthesia Quick Evaluation

## 2023-05-15 NOTE — Op Note (Signed)
DATE OF SERVICE: 06/06/2023  PATIENT:  Timothy Galvan  67 y.o. male  PRE-OPERATIVE DIAGNOSIS:  right lower extremity acute limb ischemia  POST-OPERATIVE DIAGNOSIS:  Same  PROCEDURE:   1) right axilary - right common femoral arterial bypass (6mm ePTFE) 2) thrombectomy right-to-left femoral-femoral bypass 3) right calf four compartment fasciotomies  SURGEON:  Surgeons and Role:    * Leonie Douglas, MD - Primary  ASSISTANT: Andrez Grime and Marcille Blanco  ANESTHESIA:   general  EBL:  BLOOD ADMINISTERED:none  DRAINS: none   LOCAL MEDICATIONS USED:  NONE  SPECIMEN:  none  COUNTS: confirmed correct.  TOURNIQUET:  none  PATIENT DISPOSITION:  ICU - extubated and stable.   Delay start of Pharmacological VTE agent (>24hrs) due to surgical blood loss or risk of bleeding: no  INDICATION FOR PROCEDURE: Timothy Galvan is a 67 y.o. male with right leg acute limb ischemia. He had history of iliac stenting with femoral-femoral bypass done at [facility] in 2022. CT angiogram showed infrarenal aortic occlusion. The patient was not examinable in the early morning, but I was ultimately able to see that he had no motor or sensory function in the foot. I was suspicious this had been going on for a while as his right calf was rather edematous, but I was concerned that without restoration of inflow he could not heal a right above knee amputation. After careful discussion of risks, benefits, and alternatives the patient was offered right axillary-femoral-femoral bypass and right calf fasciotomies. We specifically discussed high risk of limb loss with the family. The patient's family understood and wished to proceed.  OPERATIVE FINDINGS: Healthy axillary artery.  Reexpose both anastomoses of right common femoral artery through existing scar. Successful right axilo-femoral bypass. Successful thrombectomy of fem-fem bypass. Right calf fasciotomy showed edematous, dusky muscles that did not fasciulate when  touched with bovie.   DESCRIPTION OF PROCEDURE: After identification of the patient in the pre-operative holding area, the patient was transferred to the operating room. The patient was positioned supine on the operating room table. Anesthesia was induced. The chest, abdomen, groins, and right leg were prepped and draped in standard fashion. A surgical pause was performed confirming correct patient, procedure, and operative location.  A transverse incision fingerbreadth below the right clavicle was made with a 10 blade.  The incision was carried down through the subcutaneous tissue until the clavipectoral fascia was identified.  This was incised transversely.  The muscle fibers of the pectoralis major were split atraumatically.  Identified the axillary fat pad.  Identified the pectoralis minor muscle.  The pectoralis minor muscle was divided.  The axillary artery was identified below.  This was skeletonized for several centimeters.  I encircled the axillary artery and 1 of its branches with Silastic Vesseloops.  The wound was packed and attention was turned to the groins.  Previous scar from prior femoral-femoral bypass graft was reopened sharply with a 10 blade in both groins.  The incisions were carried down through subcutaneous tissue and scar until the limbs of the femoral-femoral graft were identified.  These were followed down onto the native arteries using careful exposure using Bovie electrocautery and Metzenbaum scissors.  I ultimately was able to expose several centimeters segment proximally distally to the anastomoses.  I created a new tunnel atraumatically and bluntly over the existing graft in case it needed to place a new graft at the end of the case.  A Kelly-Wick tunneler was used to connect the right axillary exposure  to the right femoral exposure.  I began the tunneling digitally in the axillary space taking great care to avoid tunneling into the chest or into the peritoneum.  The tunneler  was delivered into the right femoral artery exposure without difficulty.  A 6 mm externally supported PTFE vascular graft was delivered through the Kelly-Wick tunneler.  The graft was positioned so that it lay with some laxity in the axillary exposure so the patient did not suffer axillary pull out later.  The axillary artery was clamped.  The proximal end of the vascular graft was beveled.  The patient was systemically heparinized.  The proximal end of the vascular graft was sewn to the axillary arteriotomy using continuous running suture of 6-0 Prolene.  After completion the clamps were released and the anastomosis was flushed on the open of the vascular graft.  Excellent flow was confirmed.  I confirm the graft lay in a gentle curve in the axillary space without kinking or undue tension or redundancy.  The foot of the prior femoral-femoral bypass graft was opened sharply.  No bleeding returned.  I passed a #4 Fogarty embolectomy balloon catheter from right to left and retreated to large amount of acute appearing thrombus.  Some backbleeding was noted.  The distal end of the vascular graft was then sewn to the arteriotomy using continuous running suture of 6-0 Prolene.  Prior to completion the anastomosis was flushed and de-aired.  Several repair stitches were needed in this anastomosis to obtain hemostasis.  I evaluated the right femoral arteries with Doppler machine.  Brisk Doppler flow was heard which was graft dependent.  I evaluated the left common femoral artery.  There was Doppler flow in the common femoral artery, but this was not graft dependent.  I opened the anastomosis of the left common femoral artery.  Limited Fogarty embolectomy was performed and pulsatile inflow was achieved.  I repaired the arteriotomy with a running stitch of 6-0 Prolene.  Clamps were released and hemostasis was achieved.  Doppler machine confirmed multiphasic flow in the left common femoral artery which was graft  dependent.  A right calf 4 compartment fasciotomy was then performed.  A lateral incision was made between the anterior and lateral compartments using a 10 blade.  The incision was carried down through the subcutaneous tissue until the fascia of the anterior and lateral compartments was identified.  Both were incised with Bovie electrocautery.  Unfortunately the muscle in both compartments appeared dead.  The muscle did not fasciculating when interrogated with Bovie electrocautery.  The muscle did swell out of the compartments.  A medial incision was made about a centimeter of the low the tibia and the medial calf.  This was extended throughout the length of the leg.  The incision was carried down through the subcutaneous tissue until the fascia of the superficial posterior compartment was encountered.  This was divided.  The muscle swelled greatly.  The muscle was minimally responsive to Bovie electrocautery.  The tibial soleal attachments were taken down and the deep posterior compartment was entered and opened for the length of the leg.  These muscle fibers did not fasciculating when interrogated with Bovie electrocautery.  I was suspicious that this ischemic process had been ongoing for some time based on the appearance of the muscle and the behavior of the muscles when touched with Bovie electrocautery.  Axillary incision and bilateral groin incisions were closed in layers using 2-0 Vicryl and a skin stapler.  The Fasciotomy was left open  and dressed with a wound VAC.  Upon completion of the case instrument and sharps counts were confirmed correct. The patient was transferred to the ICU in stable condition. I was present for all portions of the procedure.  FOLLOW UP PLAN: The right lower extremity does not appear viable below the knee.  Patient likely to need a right above-knee amputation.  Will monitor response to revascularization.    Rande Brunt. Lenell Antu, MD [facility]  [facility] Phone Number: [phone] 05/29/2023 2:31 PM

## 2023-05-15 NOTE — Consult Note (Signed)
[facility] [place]  ASSESSMENT / PLAN: Timothy Galvan is a 67 y.o. male aortoiliac occlusion, right lower extremity atherosclerosis. There is some evidence of distal reconstitution on CT angiogram. The patient is encephalopathic and not able to provide any history or meaningfully participate in an exam with me. I cannot ascertain whether he has acute or chronic limb threatening ischemia. Distal collateralization below the aortoiliac occlusion would suggest a chronic component.   Discussed with Dr. York Cerise and Dr. Penne Lash in detail. Recommend evaluation for encephalopathy. Recommend holding all narcotics and benzodiazepenes to allow sensorium to clear and a complete vascular exam to be performed.  He is at extreme risk of limb loss.  I will serially reevaluate the patient today.  I am concerned that he will not heal an above-knee amputation without an inflow procedure and so an axillobifemoral bypass may be necessary.  CHIEF COMPLAINT: Right leg pain  HISTORY OF PRESENT ILLNESS: Timothy Galvan is a 67 y.o. male who presents to [facility] emergency department for evaluation of right leg pain which began at about midnight last night.  Patient was reportedly nonambulatory at home.  He reported to EMS personnel that his right leg went numb and his right foot felt cold.  No Doppler flow was found in the right lower extremity by the ED staff.  This prompted a CT angiogram of the abdomen pelvis with runoff to the lower extremities.  CT angiogram revealed total occlusion of the infrarenal aorta and iliac arteries.  There is distal reconstitution of the femoral arteries.  Flow into the right foot was not definitively seen.  Flow in the left foot was seen.  On my evaluation, patient is dangling his right leg over the edge of the bed.  He is thrashing in bed from side-to-side.  He did not awaken to loud verbal stimuli, requiring noxious stimuli to open his eyes.  He is not able to provide  me any history.  He cannot participate in an exam, or tell me more about his lower extremity symptoms.  Past Medical History:  Diagnosis Date   Asthma    Avascular necrosis of bone of hip (HCC)    Blood clot in vein    Cancer (HCC)    Carotid stenosis, bilateral    Cerebrovascular accident (CVA), unspecified mechanism (HCC)    Coronary artery disease    GERD (gastroesophageal reflux disease)    Heart disease    Hypertension    PAD (peripheral artery disease) (HCC)    Pneumonia     Past Surgical History:  Procedure Laterality Date   FEMORAL BYPASS     MANDIBLE SURGERY     WRIST SURGERY      History reviewed. No pertinent family history.  Social History   Socioeconomic History   Marital status: Single    Spouse name: Not on file   Number of children: Not on file   Years of education: Not on file   Highest education level: Not on file  Occupational History   Not on file  Tobacco Use   Smoking status: Every Day    Current packs/day: 1.00    Types: Cigarettes   Smokeless tobacco: Never   Tobacco comments:    Currently smoking 1ppd.  At the most he smoked 2 ppd in the pack.  Trying to quit.  02/19/2023 hfb  Vaping Use   Vaping status: Some Days   Substances: Flavoring  Substance and Sexual Activity   Alcohol use: Yes  Alcohol/week: 12.0 standard drinks of alcohol    Types: 12 Cans of beer per week   Drug use: Yes    Types: Cocaine    Comment: last month   Sexual activity: Not on file  Other Topics Concern   Not on file  Social History Narrative   Not on file   Social Determinants of Health   Financial Resource Strain: Low Risk  (05/29/2021)   Received from [facility], [facility]   Overall Financial Resource Strain (CARDIA)    Difficulty of Paying Living Expenses: Not very hard  Food Insecurity: No Food Insecurity (06/04/2022)   Hunger Vital Sign    Worried About Running Out of Food in the Last Year: Never true    Ran Out of Food in the Last  Year: Never true  Transportation Needs: No Transportation Needs (05/29/2021)   Received from [facility], [facility]   [facility] - Transportation    Lack of Transportation (Medical): No    Lack of Transportation (Non-Medical): No  Physical Activity: Not on file  Stress: Not on file  Social Connections: Not on file  Intimate Partner Violence: Not on file    Allergies  Allergen Reactions   Penicillins     Has patient had a PCN reaction causing immediate rash, facial/tongue/throat swelling, SOB or lightheadedness with hypotension: Unknown Has patient had a PCN reaction causing severe rash involving mucus membranes or skin necrosis: Unknown Has patient had a PCN reaction that required hospitalization: Unknown Has patient had a PCN reaction occurring within the last 10 years: Unknown If all of the above answers are "NO", then may proceed with Cephalosporin use.     Current Facility-Administered Medications  Medication Dose Route Frequency Provider Last Rate Last Admin   acetaminophen (TYLENOL) tablet 650 mg  650 mg Oral Q6H PRN Andris Baumann, MD       Or   acetaminophen (TYLENOL) suppository 650 mg  650 mg Rectal Q6H PRN Andris Baumann, MD       aspirin EC tablet 81 mg  81 mg Oral Daily Andris Baumann, MD       atorvastatin (LIPITOR) tablet 40 mg  40 mg Oral QHS Andris Baumann, MD       empagliflozin (JARDIANCE) tablet 10 mg  10 mg Oral Daily Andris Baumann, MD       folic acid (FOLVITE) tablet 1 mg  1 mg Oral Daily Andris Baumann, MD       heparin ADULT infusion 100 units/mL (25000 units/213mL)  950 Units/hr Intravenous Continuous Loleta Rose, MD 9.5 mL/hr at 05/22/2023 0719 950 Units/hr at 05/22/2023 0719   HYDROcodone-acetaminophen (NORCO/VICODIN) 5-325 MG per tablet 1-2 tablet  1-2 tablet Oral Q4H PRN Andris Baumann, MD       ipratropium-albuterol (DUONEB) 0.5-2.5 (3) MG/3ML nebulizer solution 3 mL  3 mL Nebulization Q6H PRN Otelia Sergeant, RPH       isosorbide  mononitrate (IMDUR) 24 hr tablet 120 mg  120 mg Oral Daily Andris Baumann, MD       LORazepam (ATIVAN) tablet 1-4 mg  1-4 mg Oral Q1H PRN Andris Baumann, MD       Or   LORazepam (ATIVAN) injection 1-4 mg  1-4 mg Intravenous Q1H PRN Andris Baumann, MD   2 mg at 06/05/2023 0545   losartan (COZAAR) tablet 25 mg  25 mg Oral Daily Andris Baumann, MD  magnesium oxide (MAG-OX) tablet 400 mg  400 mg Oral QHS Andris Baumann, MD       melatonin tablet 5 mg  5 mg Oral QHS Andris Baumann, MD       morphine (PF) 2 MG/ML injection 2 mg  2 mg Intravenous Q2H PRN Andris Baumann, MD   2 mg at 05/20/2023 0544   multivitamin with minerals tablet 1 tablet  1 tablet Oral Daily Andris Baumann, MD       nicotine (NICODERM CQ - dosed in mg/24 hours) patch 21 mg  21 mg Transdermal Daily Andris Baumann, MD       nitroGLYCERIN (NITROSTAT) SL tablet 0.4 mg  0.4 mg Sublingual Q5 min PRN Andris Baumann, MD       ondansetron [facility]) tablet 4 mg  4 mg Oral Q6H PRN Andris Baumann, MD       Or   ondansetron [facility]) injection 4 mg  4 mg Intravenous Q6H PRN Andris Baumann, MD       pantoprazole (PROTONIX) EC tablet 40 mg  40 mg Oral Daily Andris Baumann, MD       thiamine (VITAMIN B1) tablet 100 mg  100 mg Oral Daily Andris Baumann, MD       Or   thiamine (VITAMIN B1) injection 100 mg  100 mg Intravenous Daily Andris Baumann, MD       traZODone (DESYREL) tablet 50 mg  50 mg Oral QHS PRN Andris Baumann, MD       umeclidinium bromide (INCRUSE ELLIPTA) 62.5 MCG/ACT 1 puff  1 puff Inhalation Daily Andris Baumann, MD       Current Outpatient Medications  Medication Sig Dispense Refill   aspirin EC 81 MG tablet Take 1 tablet (81 mg total) by mouth daily. Swallow whole. 100 tablet 0   atorvastatin (LIPITOR) 40 MG tablet Take 1 tablet (40 mg total) by mouth at bedtime. 40 tablet 0   Blood Pressure Monitoring (BLOOD PRESSURE MONITOR & DTX) KIT 1 each by Does not apply route daily. 1 kit 3   clopidogrel (PLAVIX) 75  MG tablet Take 1 tablet (75 mg total) by mouth daily. 30 tablet 0   diclofenac Sodium (VOLTAREN) 1 % GEL Apply 4 g topically 4 (four) times daily.     gabapentin (NEURONTIN) 400 MG capsule TAKE 1 CAPSULE BY MOUTH 3 TIMES  DAILY 300 capsule 2   Ipratropium-Albuterol (COMBIVENT) 20-100 MCG/ACT AERS respimat Inhale 1 puff into the lungs every 6 (six) hours as needed for wheezing.     isosorbide mononitrate (IMDUR) 120 MG 24 hr tablet Take 120 mg by mouth daily.     JARDIANCE 10 MG TABS tablet Take 10 mg by mouth daily.     lidocaine (LIDODERM) 5 % Place 2 patches onto the skin daily. Remove & Discard patch within 12 hours or as directed by MD 60 patch 0   losartan (COZAAR) 25 MG tablet Take 1 tablet (25 mg total) by mouth daily. 90 tablet 3   magnesium oxide (MAG-OX) 400 MG tablet Take 1 tablet (400 mg total) by mouth at bedtime. 30 tablet 0   Melatonin 5 MG CAPS Take 1 capsule (5 mg total) by mouth daily. 90 capsule 3   Multiple Vitamin (MULTIVITAMIN WITH MINERALS) TABS tablet Take 1 tablet by mouth daily. 30 tablet 0   nitroGLYCERIN (NITROSTAT) 0.4 MG SL tablet Place 1 tablet (0.4 mg total) under the tongue every 5 (five)  minutes as needed for chest pain. 5 tablet 0   pantoprazole (PROTONIX) 40 MG tablet Take 1 tablet (40 mg total) by mouth daily. 30 tablet 0   thiamine (VITAMIN B1) 100 MG tablet Take 1 tablet (100 mg total) by mouth daily. 30 tablet 0   traZODone (DESYREL) 50 MG tablet 1 at bedtime for sleep 30 tablet 2   umeclidinium bromide (INCRUSE ELLIPTA) 62.5 MCG/ACT AEPB Inhale 1 puff into the lungs daily. 30 each 0   Vitamin D, Ergocalciferol, (DRISDOL) 1.25 MG (50000 UNIT) CAPS capsule Take 1 capsule (50,000 Units total) by mouth every 7 (seven) days. (Patient taking differently: Take 50,000 Units by mouth every 30 (thirty) days.) 5 capsule 0    PHYSICAL EXAM Vitals:   06/01/2023 0500 06/08/2023 0530 06/12/2023 0600 05/28/2023 0630  BP: (!) 170/108 (!) 198/115 (!) 187/104 (!) 176/104  Pulse:     96  Resp:   18 17  Temp:      TempSrc:      SpO2:    91%  Weight:      Height:       Thin, chronically ill man.  Encephalopathic.  Briefly awakens to noxious stimuli. Right leg dangling over edge of bed on my arrival.   Patient moving continuously to the bed during my evaluation. No Doppler flow noted in the right lower extremity, but patient continuously moving throughout exam. Patient had able to demonstrate any motor function in the right foot.   Patient does withdraw to noxious stimuli in the right lower extremity   PERTINENT LABORATORY AND RADIOLOGIC DATA  Most recent CBC    Latest Ref Rng & Units 06/01/2023   12:01 AM 11/29/2022   12:52 PM 06/08/2022    5:13 AM  CBC  WBC 4.0 - 10.5 K/uL 9.7  6.3  6.4   Hemoglobin 13.0 - 17.0 g/dL 16.1  09.6  04.5   Hematocrit 39.0 - 52.0 % 48.2  44.4  41.5   Platelets 150 - 400 K/uL 183  225  250      Most recent CMP    Latest Ref Rng & Units 05/20/2023   12:01 AM 11/29/2022   12:52 PM 06/08/2022    5:13 AM  CMP  Glucose 70 - 99 mg/dL 409  811  914   BUN 8 - 23 mg/dL 20  28  13    Creatinine 0.61 - 1.24 mg/dL 7.82  9.56  2.13   Sodium 135 - 145 mmol/L 138  140  140   Potassium 3.5 - 5.1 mmol/L 4.4  4.0  3.9   Chloride 98 - 111 mmol/L 106  112  107   CO2 22 - 32 mmol/L 22  20  24    Calcium 8.9 - 10.3 mg/dL 08.6  8.9  9.5     Renal function Estimated Creatinine Clearance: 49.2 mL/min (by C-G formula based on SCr of 1.14 mg/dL).  Hgb A1c MFr Bld (%)  Date Value  06/01/2022 6.3 (H)    LDL Cholesterol  Date Value Ref Range Status  06/02/2022 52 0 - 99 mg/dL Final    Comment:           Total Cholesterol/HDL:CHD Risk Coronary Heart Disease Risk Table                     Men   Women  1/2 Average Risk   3.4   3.3  Average Risk       5.0   4.4  2 X Average Risk   9.6   7.1  3 X Average Risk  23.4   11.0        Use the calculated Patient Ratio above and the CHD Risk Table to determine the patient's CHD Risk.        ATP  III CLASSIFICATION (LDL):  <100     mg/dL   Optimal  161-096  mg/dL   Near or Above                    Optimal  130-159  mg/dL   Borderline  045-409  mg/dL   High  >811     mg/dL   Very High Performed at [facility], [address] [place], [place] [address]     CT angiogram reviewed in detail. Patient has an aortoiliac occlusion of unclear chronicity.  There is distal reconstitution of flow below the occlusion suggesting a chronic component to this.  He also has evidence of iliac artery stenting and femoral-femoral bypass, further arguing to a chronic component to this process. Contrast is seen to the level of the calf and the right lower extremity.  I am not able to see any flow into the foot.  The left posterior tibial artery at the ankle is opacified with contrast.  Rande Brunt. Lenell Antu, MD FACS [facility] Phone Number: [phone] 05/30/2023 8:19 AM   Total time spent on preparing this encounter including chart review, data review, collecting history, examining the patient, coordinating care for this new patient, 60 minutes.  Portions of this report may have been transcribed using voice recognition software.  Every effort has been made to ensure accuracy; however, inadvertent computerized transcription errors may still be present.

## 2023-05-15 NOTE — Assessment & Plan Note (Signed)
No complaints of chest pain, EKG nonacute continue losartan, isosorbide, aspirin and atorvastatin nitroglycerin

## 2023-05-15 NOTE — ED Notes (Signed)
Patient's right leg elevated on pillow

## 2023-05-15 NOTE — ED Notes (Signed)
Pt returned from CT °

## 2023-05-15 NOTE — Plan of Care (Signed)
  Problem: Clinical Measurements: Goal: Will remain free from infection Outcome: Progressing Goal: Respiratory complications will improve Outcome: Progressing Goal: Cardiovascular complication will be avoided Outcome: Progressing   Problem: Elimination: Goal: Will not experience complications related to bowel motility Outcome: Progressing Goal: Will not experience complications related to urinary retention Outcome: Progressing   Problem: Pain Management: Goal: General experience of comfort will improve Outcome: Progressing   Problem: Safety: Goal: Ability to remain free from injury will improve Outcome: Progressing   Problem: Skin Integrity: Goal: Risk for impaired skin integrity will decrease Outcome: Progressing   Problem: Education: Goal: Knowledge of General Education information will improve Description: Including pain rating scale, medication(s)/side effects and non-pharmacologic comfort measures Outcome: Not Progressing   Problem: Health Behavior/Discharge Planning: Goal: Ability to manage health-related needs will improve Outcome: Not Progressing   Problem: Nutrition: Goal: Adequate nutrition will be maintained Outcome: Not Progressing

## 2023-05-15 NOTE — Assessment & Plan Note (Signed)
Continue aspirin and statins ?

## 2023-05-15 NOTE — Assessment & Plan Note (Signed)
-  Nicotine patch 

## 2023-05-15 NOTE — Assessment & Plan Note (Signed)
Continue heparin infusion N.p.o. for possible procedure Vascular Dr. Wells Guiles formally consulted Pain control

## 2023-05-15 NOTE — Anesthesia Postprocedure Evaluation (Signed)
Anesthesia Post Note  Patient: DEAVEN URWIN  Procedure(s) Performed: RIGHT AXILLARY FEM BYPASS, THROMBECTOMY FEM/FEM BYPASS (Bilateral: Groin) RIGHT CALF FASCIOTOMY (Right: Leg Lower) APPLICATION OF WOUND VAC    [id number] (Right: Leg Lower)  Patient location during evaluation: PACU Anesthesia Type: General Level of consciousness: awake and alert Pain management: pain level controlled Vital Signs Assessment: post-procedure vital signs reviewed and stable Respiratory status: spontaneous breathing, nonlabored ventilation, respiratory function stable and patient connected to nasal cannula oxygen Cardiovascular status: blood pressure returned to baseline and stable Postop Assessment: no apparent nausea or vomiting Anesthetic complications: no   No notable events documented.   Last Vitals:  Vitals:   05/30/2023 1500 06/04/2023 1515  BP: (!) 146/101 (!) 152/93  Pulse:    Resp:  (!) 22  Temp:    SpO2:  100%    Last Pain:  Vitals:   06/10/2023 1515  TempSrc:   PainSc: Asleep                 Louie Boston

## 2023-05-15 NOTE — Assessment & Plan Note (Signed)
Pain control

## 2023-05-15 NOTE — ED Notes (Signed)
Daughter signed consent for surgery

## 2023-05-15 NOTE — ED Notes (Signed)
Patient ripped IV out. Heparin switched to IV in [facility].Patient also  refuses to keep leg propped up or stay still in the bed.

## 2023-05-15 NOTE — Progress Notes (Signed)
ANTICOAGULATION CONSULT NOTE  Pharmacy Consult for heparin infusion Indication: Ischemic right lower leg  Allergies  Allergen Reactions   Penicillins     Has patient had a PCN reaction causing immediate rash, facial/tongue/throat swelling, SOB or lightheadedness with hypotension: Unknown Has patient had a PCN reaction causing severe rash involving mucus membranes or skin necrosis: Unknown Has patient had a PCN reaction that required hospitalization: Unknown Has patient had a PCN reaction occurring within the last 10 years: Unknown If all of the above answers are "NO", then may proceed with Cephalosporin use.     Patient Measurements:   Heparin Dosing Weight: 62.6 kg  Vital Signs: Temp: 98 F (36.7 C) (11/01 2337) Temp Source: Oral (11/01 2337) BP: 185/96 (11/01 2333) Pulse Rate: 85 (11/01 2336)  Labs: No results for input(s): "HGB", "HCT", "PLT", "APTT", "LABPROT", "INR", "HEPARINUNFRC", "HEPRLOWMOCWT", "CREATININE", "CKTOTAL", "CKMB", "TROPONINIHS" in the last 72 hours.  CrCl cannot be calculated (Patient's most recent lab result is older than the maximum 21 days allowed.).   Medical History: Past Medical History:  Diagnosis Date   Asthma    Avascular necrosis of bone of hip (HCC)    Blood clot in vein    Cancer (HCC)    Carotid stenosis, bilateral    Cerebrovascular accident (CVA), unspecified mechanism (HCC)    Coronary artery disease    GERD (gastroesophageal reflux disease)    Heart disease    Hypertension    PAD (peripheral artery disease) (HCC)    Pneumonia     Medications:  PTA Meds: Eliquis 5 mg BID, Baseline HL 0.10  Assessment: Pt is a 67 yo male presenting to ED reporting R leg & foot pain, which began to feel numb and cold.  Goal of Therapy:  Heparin level 0.3-0.7 units/ml aPTT 66-102 seconds Monitor platelets by anticoagulation protocol: Yes   Plan:  Bolus 3800 units x 1 Start heparin infusion at 950 units/hr Will follow aPTT until  correlation w/ HL confirmed Will check aPTT in 6 hr after start of infusion HL & CBC daily while on heparin  Otelia Sergeant, PharmD, [facility] 06/12/2023 12:10 AM

## 2023-05-15 NOTE — ED Provider Notes (Signed)
[facility] Provider Note    Event Date/Time   First MD Initiated Contact with Patient 06/02/2023 2341     (approximate)   History   Foot Pain   HPI Timothy Galvan is a 67 y.o. male with extensive peripheral vascular/arterial disease in the past.  He has had multiple prior vascular surgeries.  He presents for acute onset and severe pain in his right leg from the knee down, particularly in his foot.  He said it started acutely within the couple of hours prior to arrival.  It is severe and he cannot find a position of comfort.  He does not describe any numbness or weakness but he cannot bear weight due to the pain.  No other symptoms at this time.       Physical Exam   Triage Vital Signs: ED Triage Vitals  Encounter Vitals Group     BP 06/05/2023 2333 (!) 185/96     Systolic BP Percentile --      Diastolic BP Percentile --      Pulse Rate 05/28/2023 2336 85     Resp 05/25/2023 2337 18     Temp 06/07/2023 2337 98 F (36.7 C)     Temp Source 05/30/2023 2337 Oral     SpO2 05/27/2023 2336 99 %     Weight 06/10/2023 0108 62.6 kg (138 lb)     Height 06/02/2023 0108 1.6 m (5\' 3" )     Head Circumference --      Peak Flow --      Pain Score 05/20/2023 2320 10     Pain Loc --      Pain Education --      Exclude from Growth Chart --     Most recent vital signs: Vitals:   06/04/2023 0115 05/17/2023 0300  BP: (!) 180/153 (!) 178/139  Pulse:  93  Resp: (!) 21 20  Temp:    SpO2:  93%    General: Awake, alert, severe distress due to pain. CV:  Poor peripheral perfusion.  No palpable or dopplerable pulse in right foot.  Extremity from the knee down is cooler than the left.  He is able to move his foot and his leg but it is painful to do so. Resp:  Normal effort. Speaking easily and comfortably, no accessory muscle usage nor intercostal retractions.   Abd:  No distention.    ED Results / Procedures / Treatments   Labs (all labs ordered are listed, but only abnormal results  are displayed) Labs Reviewed  BASIC METABOLIC PANEL - Abnormal; Notable for the following components:      Result Value   Glucose, Bld 113 (*)    All other components within normal limits  HEPARIN LEVEL (UNFRACTIONATED) - Abnormal; Notable for the following components:   Heparin Unfractionated 0.10 (*)    All other components within normal limits  PROTIME-INR  APTT  CBC WITH DIFFERENTIAL/PLATELET  APTT  HEPARIN LEVEL (UNFRACTIONATED)     RADIOLOGY I viewed and interpreted the patient's CTA; see hospital course for details.   PROCEDURES:  Critical Care performed: Yes, see critical care procedure note(s)  .1-3 Lead EKG Interpretation  Performed by: Loleta Rose, MD Authorized by: Loleta Rose, MD     Interpretation: normal     ECG rate:  90   ECG rate assessment: normal     Rhythm: sinus rhythm     Ectopy: none     Conduction: normal   .Critical Care  Performed by: Loleta Rose, MD Authorized by: Loleta Rose, MD   Critical care provider statement:    Critical care time (minutes):  60   Critical care time was exclusive of:  Separately billable procedures and treating other patients   Critical care was necessary to treat or prevent imminent or life-threatening deterioration of the following conditions:  Circulatory failure   Critical care was time spent personally by me on the following activities:  Development of treatment plan with patient or surrogate, evaluation of patient's response to treatment, examination of patient, obtaining history from patient or surrogate, ordering and performing treatments and interventions, ordering and review of laboratory studies, ordering and review of radiographic studies, pulse oximetry, re-evaluation of patient's condition and review of old charts     IMPRESSION / MDM / ASSESSMENT AND PLAN / ED COURSE  I reviewed the triage vital signs and the nursing notes.                              Differential diagnosis includes, but is  not limited to, limb ischemia from arterial occlusion, DVT, musculoskeletal strain.  Patient's presentation is most consistent with acute presentation with potential threat to life or bodily function.  Labs/studies ordered: CBC with differential, pro time-INR, APTT, BMP, CTA aortobifemoral  Interventions/Medications given:  Medications  heparin ADULT infusion 100 units/mL (25000 units/258mL) (950 Units/hr Intravenous New Bag/Given 06/09/2023 0216)  morphine (PF) 4 MG/ML injection 4 mg (4 mg Intravenous Given 06/06/2023 0004)  ondansetron (ZOFRAN) injection 4 mg (4 mg Intravenous Given 06/12/2023 0003)  HYDROmorphone (DILAUDID) injection 1 mg (1 mg Intravenous Given 05/30/2023 0108)  iohexol (OMNIPAQUE) 350 MG/ML injection 125 mL (125 mLs Intravenous Contrast Given 06/12/2023 0125)  heparin bolus via infusion 3,800 Units (3,800 Units Intravenous Bolus from Bag 06/06/2023 0217)  HYDROmorphone (DILAUDID) injection 1 mg (1 mg Intravenous Given 05/29/2023 0325)    (Note:  hospital course my include additional interventions and/or labs/studies not listed above.)   I evaluated the patient as soon as he was brought to a room and I cannot find a pulse and his right foot.  Examination is consistent with ischemic limb.  I initiated heparin administration and I consulted Dr. Lenell Antu who is covering for vascular surgery for [facility].  We discussed the case and he agreed with the plan for treatment with heparin and agreed with my plan to obtain a CTA aortobifemoral for further assessment of the patient's vascular status.  I updated the patient.  I ordered morphine 4 mg IV and Zofran 4 mg IV.   The patient is on the cardiac monitor to evaluate for evidence of arrhythmia and/or significant heart rate changes.   Clinical Course as of 05/17/2023 0403  Sat May 15, 2023  0058 Labs are all reassuring.  Patient still in severe pain, ordered Dilaudid 1 mg IV.  CTA pending. [CF]  0233 CT Angio Aortobifemoral W and/or Wo Contrast I viewed  and interpreted the patient's CTA and I see evidence of extensive thrombosis throughout the right lower extremity.  Radiologist confirmed extensive thrombosis of the existing graft send of the limb in general.  Upon reassessment, the patient is moving his right foot less than before but still has sensation.  Pain is better controlled.  I consulted again with Dr. Butch Penny with vascular surgery.  He will review the imaging and the patient's case and come up with a plan for surgery likely in the morning.  He recommends  admission to the hospital service and continuing the heparin infusion.  I updated the patient as well.  Consulting hospitalist team. [CF]  [phone] Consulted Dr. Para March with the hospitalist service who will admit. [CF]  0320 Dr. Butch Penny with vascular contact me again and pointed out the patient has had prior surgeries at [facility].  He recommended I talk with the patient about whether he would rather be transferred there.  I discussed it with the patient and he says he would rather stay in [place].  I updated Dr. Butch Penny and we will continue with the plan for admission [CF]    Clinical Course User Index [CF] Loleta Rose, MD     FINAL CLINICAL IMPRESSION(S) / ED DIAGNOSES   Final diagnoses:  Acute lower limb ischemia     Rx / DC Orders   ED Discharge Orders     None        Note:  This document was prepared using Dragon voice recognition software and may include unintentional dictation errors.   Loleta Rose, MD 05/16/2023 [phone]

## 2023-05-15 NOTE — Progress Notes (Signed)
Patient clearer on my serial re-evaluation. Able to participate in exam. No doppler flow in R foot. No sensation in R foot. No motor function in R foot, but limited. This is worse, by report, from intake exam by Dr. York Cerise. Will plan to proceed to OR for right axillary to bifemoral bypass and right calf fasciotomy urgently.  Discussed with patient's daughter Timothy Galvan [phone]) Reviewed plan for OR.  Explained we may not be able to salvage the leg. She is understanding and appreciative.  Rande Brunt. Lenell Antu, MD [facility] Phone Number: [phone] 06/03/2023 9:28 AM

## 2023-05-15 NOTE — ED Notes (Signed)
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 CIWA performed on patient and medicated appropriately. VSS, CCM in use, call light within reach.

## 2023-05-15 NOTE — Anesthesia Procedure Notes (Signed)
Procedure Name: Intubation Date/Time: 05/31/2023 11:15 AM  Performed by: Stormy Fabian, CRNAPre-anesthesia Checklist: Patient identified, Patient being monitored, Timeout performed, Emergency Drugs available and Suction available Patient Re-evaluated:Patient Re-evaluated prior to induction Oxygen Delivery Method: Circle system utilized Preoxygenation: Pre-oxygenation with 100% oxygen Induction Type: IV induction Ventilation: Mask ventilation without difficulty Laryngoscope Size: Mac, McGraph and 4 Grade View: Grade I Tube type: Oral Tube size: 7.5 mm Number of attempts: 1 Airway Equipment and Method: Stylet Placement Confirmation: ETT inserted through vocal cords under direct vision, positive ETCO2 and breath sounds checked- equal and bilateral Secured at: 21 cm Tube secured with: Tape Dental Injury: Teeth and Oropharynx as per pre-operative assessment

## 2023-05-15 NOTE — Transfer of Care (Signed)
Immediate Anesthesia Transfer of Care Note  Patient: Leslie Dales  Procedure(s) Performed: Procedure(s) with comments: RIGHT AXILLARY FEM BYPASS, THROMBECTOMY FEM/FEM BYPASS (Bilateral) RIGHT CALF FASCIOTOMY (Right) - WOUND VAC PLACEMENT APPLICATION OF WOUND VAC    [id number] (Right)  Patient Location: PACU  Anesthesia Type:General  Level of Consciousness: sedated  Airway & Oxygen Therapy: Patient Spontanous Breathing and Patient connected to face mask oxygen  Post-op Assessment: Report given to RN and Post -op Vital signs reviewed and stable  Post vital signs: Reviewed and stable  Last Vitals:  Vitals:   05/31/2023 1100 06/03/2023 1444  BP: (!) 169/104 (!) 144/119  Pulse: (!) 136 97  Resp: 14 20  Temp: 37.3 C 36.5 C  SpO2: 94% 96%    Complications: No apparent anesthesia complications

## 2023-05-16 ENCOUNTER — Encounter: Payer: Self-pay | Admitting: Student in an Organized Health Care Education/Training Program

## 2023-05-16 DIAGNOSIS — I480 Paroxysmal atrial fibrillation: Secondary | ICD-10-CM | POA: Diagnosis not present

## 2023-05-16 DIAGNOSIS — F1910 Other psychoactive substance abuse, uncomplicated: Secondary | ICD-10-CM

## 2023-05-16 DIAGNOSIS — F1090 Alcohol use, unspecified, uncomplicated: Secondary | ICD-10-CM | POA: Diagnosis not present

## 2023-05-16 DIAGNOSIS — J42 Unspecified chronic bronchitis: Secondary | ICD-10-CM

## 2023-05-16 DIAGNOSIS — I739 Peripheral vascular disease, unspecified: Secondary | ICD-10-CM | POA: Diagnosis not present

## 2023-05-16 DIAGNOSIS — Z9889 Other specified postprocedural states: Secondary | ICD-10-CM

## 2023-05-16 DIAGNOSIS — I5042 Chronic combined systolic (congestive) and diastolic (congestive) heart failure: Secondary | ICD-10-CM

## 2023-05-16 DIAGNOSIS — I70229 Atherosclerosis of native arteries of extremities with rest pain, unspecified extremity: Secondary | ICD-10-CM | POA: Diagnosis not present

## 2023-05-16 DIAGNOSIS — I2510 Atherosclerotic heart disease of native coronary artery without angina pectoris: Secondary | ICD-10-CM

## 2023-05-16 DIAGNOSIS — Z8673 Personal history of transient ischemic attack (TIA), and cerebral infarction without residual deficits: Secondary | ICD-10-CM

## 2023-05-16 DIAGNOSIS — Z515 Encounter for palliative care: Secondary | ICD-10-CM | POA: Diagnosis not present

## 2023-05-16 DIAGNOSIS — I428 Other cardiomyopathies: Secondary | ICD-10-CM

## 2023-05-16 DIAGNOSIS — I471 Supraventricular tachycardia, unspecified: Secondary | ICD-10-CM | POA: Insufficient documentation

## 2023-05-16 DIAGNOSIS — I998 Other disorder of circulatory system: Secondary | ICD-10-CM | POA: Diagnosis not present

## 2023-05-16 DIAGNOSIS — F17200 Nicotine dependence, unspecified, uncomplicated: Secondary | ICD-10-CM

## 2023-05-16 DIAGNOSIS — G9341 Metabolic encephalopathy: Secondary | ICD-10-CM

## 2023-05-16 DIAGNOSIS — R4182 Altered mental status, unspecified: Secondary | ICD-10-CM

## 2023-05-16 LAB — BASIC METABOLIC PANEL
Anion gap: 11 (ref 5–15)
BUN: 23 mg/dL (ref 8–23)
CO2: 20 mmol/L — ABNORMAL LOW (ref 22–32)
Calcium: 9 mg/dL (ref 8.9–10.3)
Chloride: 105 mmol/L (ref 98–111)
Creatinine, Ser: 1.04 mg/dL (ref 0.61–1.24)
GFR, Estimated: >60 mL/min (ref >60)
Glucose, Bld: 163 mg/dL — ABNORMAL HIGH (ref 70–99)
Potassium: 4.6 mmol/L (ref 3.5–5.1)
Sodium: 136 mmol/L (ref 135–145)

## 2023-05-16 LAB — TSH: TSH: 0.747 u[IU]/mL (ref 0.350–4.500)

## 2023-05-16 LAB — LIPID PANEL
Cholesterol: 193 mg/dL (ref 0–200)
HDL: 68 mg/dL (ref >40)
LDL Cholesterol: 106 mg/dL — ABNORMAL HIGH (ref 0–99)
Total CHOL/HDL Ratio: 2.8 {ratio}
Triglycerides: 93 mg/dL (ref <150)
VLDL: 19 mg/dL (ref 0–40)

## 2023-05-16 LAB — BLOOD GAS, VENOUS
Acid-base deficit: 1.4 mmol/L (ref 0.0–2.0)
Bicarbonate: 22.8 mmol/L (ref 20.0–28.0)
O2 Saturation: 85.8 %
Patient temperature: 37
pCO2, Ven: 36 mm[Hg] — ABNORMAL LOW (ref 44–60)
pH, Ven: 7.41 (ref 7.25–7.43)
pO2, Ven: 54 mm[Hg] — ABNORMAL HIGH (ref 32–45)

## 2023-05-16 LAB — LACTIC ACID, PLASMA
Lactic Acid, Venous: 3.6 mmol/L (ref 0.5–1.9)
Lactic Acid, Venous: 7.1 mmol/L (ref 0.5–1.9)

## 2023-05-16 LAB — CBC
HCT: 46.6 % (ref 39.0–52.0)
Hemoglobin: 15.1 g/dL (ref 13.0–17.0)
MCH: 29.5 pg (ref 26.0–34.0)
MCHC: 32.4 g/dL (ref 30.0–36.0)
MCV: 91.2 fL (ref 80.0–100.0)
Platelets: 166 10*3/uL (ref 150–400)
RBC: 5.11 MIL/uL (ref 4.22–5.81)
RDW: 14.1 % (ref 11.5–15.5)
WBC: 14.9 10*3/uL — ABNORMAL HIGH (ref 4.0–10.5)
nRBC: 0.1 % (ref 0.0–0.2)

## 2023-05-16 LAB — CK: Total CK: 10993 U/L — ABNORMAL HIGH (ref 49–397)

## 2023-05-16 LAB — MAGNESIUM: Magnesium: 2 mg/dL (ref 1.7–2.4)

## 2023-05-16 LAB — HEPARIN LEVEL (UNFRACTIONATED)
Heparin Unfractionated: 0.46 [IU]/mL (ref 0.30–0.70)
Heparin Unfractionated: 0.41 [IU]/mL (ref 0.30–0.70)

## 2023-05-16 LAB — HEMOGLOBIN A1C
Hgb A1c MFr Bld: 6.1 % — ABNORMAL HIGH (ref 4.8–5.6)
Mean Plasma Glucose: 128.37 mg/dL

## 2023-05-16 LAB — PHOSPHORUS: Phosphorus: 3.7 mg/dL (ref 2.5–4.6)

## 2023-05-16 MED ORDER — VERAPAMIL HCL 2.5 MG/ML IV SOLN
5.0000 mg | INTRAVENOUS | Status: DC
  Filled 2023-05-16: qty 2

## 2023-05-16 MED ORDER — ADENOSINE 12 MG/4ML IV SOLN
INTRAVENOUS | Status: AC
  Filled 2023-05-16: qty 4

## 2023-05-16 MED ORDER — LACTATED RINGERS IV SOLN: INTRAVENOUS | Status: DC

## 2023-05-16 MED ORDER — IPRATROPIUM-ALBUTEROL 0.5-2.5 (3) MG/3ML IN SOLN: 3.0000 mL | Freq: Four times a day (QID) | RESPIRATORY_TRACT | Status: DC

## 2023-05-16 MED ORDER — IPRATROPIUM-ALBUTEROL 0.5-2.5 (3) MG/3ML IN SOLN: 3.0000 mL | RESPIRATORY_TRACT | Status: DC | PRN

## 2023-05-16 MED ORDER — MIDAZOLAM HCL 2 MG/2ML IJ SOLN
INTRAMUSCULAR | Status: AC
  Administered 2023-05-16: 2 mg
  Filled 2023-05-16: qty 2

## 2023-05-16 MED ORDER — AMIODARONE HCL IN DEXTROSE 360-4.14 MG/200ML-% IV SOLN: 60.0000 mg/h | INTRAVENOUS | Status: DC

## 2023-05-16 MED ORDER — ADENOSINE 12 MG/4ML IV SOLN
INTRAVENOUS | Status: AC
  Administered 2023-05-16: 12 mg
  Filled 2023-05-16: qty 4

## 2023-05-16 MED ORDER — AMIODARONE LOAD VIA INFUSION
150.0000 mg | Freq: Once | INTRAVENOUS | Status: DC
  Filled 2023-05-16: qty 83.34

## 2023-05-16 MED ORDER — DEXMEDETOMIDINE HCL IN NACL 400 MCG/100ML IV SOLN
INTRAVENOUS | Status: AC
  Filled 2023-05-16: qty 100

## 2023-05-16 MED ORDER — AMIODARONE HCL IN DEXTROSE 360-4.14 MG/200ML-% IV SOLN
60.0000 mg/h | INTRAVENOUS | Status: AC
  Administered 2023-05-16: 60 mg/h via INTRAVENOUS
  Filled 2023-05-16 (×2): qty 200

## 2023-05-16 MED ORDER — PANTOPRAZOLE SODIUM 40 MG IV SOLR
40.0000 mg | Freq: Every day | INTRAVENOUS | Status: DC
  Administered 2023-05-16 – 2023-05-19 (×4): 40 mg via INTRAVENOUS
  Filled 2023-05-16 (×4): qty 10

## 2023-05-16 MED ORDER — DILTIAZEM LOAD VIA INFUSION
10.0000 mg | Freq: Once | INTRAVENOUS | Status: DC
  Filled 2023-05-16: qty 10

## 2023-05-16 MED ORDER — ADENOSINE 12 MG/4ML IV SOLN
6.0000 mg | Freq: Once | INTRAVENOUS | Status: AC
  Administered 2023-05-16: 6 mg via INTRAVENOUS

## 2023-05-16 MED ORDER — FENTANYL CITRATE (PF) 100 MCG/2ML IJ SOLN
INTRAMUSCULAR | Status: AC
  Administered 2023-05-16: 100 ug
  Filled 2023-05-16: qty 2

## 2023-05-16 MED ORDER — DILTIAZEM HCL-DEXTROSE 125-5 MG/125ML-% IV SOLN (PREMIX)
2.5000 mg/h | INTRAVENOUS | Status: DC
  Administered 2023-05-16: 2.5 mg/h via INTRAVENOUS
  Filled 2023-05-16: qty 125

## 2023-05-16 MED ORDER — DILTIAZEM HCL 25 MG/5ML IV SOLN
10.0000 mg | Freq: Once | INTRAVENOUS | Status: AC
  Administered 2023-05-16: 10 mg via INTRAVENOUS

## 2023-05-16 MED ORDER — AMIODARONE HCL IN DEXTROSE 360-4.14 MG/200ML-% IV SOLN
30.0000 mg/h | INTRAVENOUS | Status: DC
  Administered 2023-05-16 – 2023-05-18 (×5): 30 mg/h via INTRAVENOUS
  Filled 2023-05-16 (×4): qty 200

## 2023-05-16 MED ORDER — AMIODARONE HCL IN DEXTROSE 360-4.14 MG/200ML-% IV SOLN: 30.0000 mg/h | INTRAVENOUS | Status: DC

## 2023-05-16 MED ORDER — DEXMEDETOMIDINE HCL IN NACL 400 MCG/100ML IV SOLN
0.0000 ug/kg/h | INTRAVENOUS | Status: DC
  Administered 2023-05-16: 0.4 ug/kg/h via INTRAVENOUS
  Administered 2023-05-17: 1.2 ug/kg/h via INTRAVENOUS
  Administered 2023-05-17: 0.7 ug/kg/h via INTRAVENOUS
  Filled 2023-05-16 (×3): qty 100

## 2023-05-16 MED ORDER — DILTIAZEM HCL 25 MG/5ML IV SOLN
10.0000 mg | Freq: Once | INTRAVENOUS | Status: DC
  Filled 2023-05-16: qty 5

## 2023-05-16 MED ORDER — METOPROLOL TARTRATE 5 MG/5ML IV SOLN
10.0000 mg | Freq: Once | INTRAVENOUS | Status: AC
  Administered 2023-05-16: 10 mg via INTRAVENOUS
  Filled 2023-05-16: qty 10

## 2023-05-16 NOTE — Plan of Care (Signed)
  Problem: Clinical Measurements: Goal: Respiratory complications will improve Outcome: Progressing   Problem: Education: Goal: Knowledge of General Education information will improve Description: Including pain rating scale, medication(s)/side effects and non-pharmacologic comfort measures Outcome: Not Progressing   Problem: Health Behavior/Discharge Planning: Goal: Ability to manage health-related needs will improve Outcome: Not Progressing   Problem: Clinical Measurements: Goal: Ability to maintain clinical measurements within normal limits will improve Outcome: Not Progressing Goal: Will remain free from infection Outcome: Not Progressing Goal: Diagnostic test results will improve Outcome: Not Progressing Goal: Cardiovascular complication will be avoided Outcome: Not Progressing   Problem: Activity: Goal: Risk for activity intolerance will decrease Outcome: Not Progressing   Problem: Nutrition: Goal: Adequate nutrition will be maintained Outcome: Not Progressing   Problem: Coping: Goal: Level of anxiety will decrease Outcome: Not Progressing   Problem: Pain Management: Goal: General experience of comfort will improve Outcome: Not Progressing   Problem: Safety: Goal: Ability to remain free from injury will improve Outcome: Not Progressing   Problem: Skin Integrity: Goal: Risk for impaired skin integrity will decrease Outcome: Not Progressing   Problem: Education: Goal: Knowledge of prescribed regimen will improve Outcome: Not Progressing   Problem: Activity: Goal: Ability to tolerate increased activity will improve Outcome: Not Progressing   Problem: Bowel/Gastric: Goal: Gastrointestinal status for postoperative course will improve Outcome: Not Progressing   Problem: Clinical Measurements: Goal: Postoperative complications will be avoided or minimized Outcome: Not Progressing Goal: Signs and symptoms of graft occlusion will improve Outcome: Not  Progressing   Problem: Skin Integrity: Goal: Demonstration of wound healing without infection will improve Outcome: Not Progressing

## 2023-05-16 NOTE — Progress Notes (Signed)
ANTICOAGULATION CONSULT NOTE  Pharmacy Consult for heparin infusion Indication: Ischemic right lower leg  Allergies  Allergen Reactions   Penicillins     Has patient had a PCN reaction causing immediate rash, facial/tongue/throat swelling, SOB or lightheadedness with hypotension: Unknown Has patient had a PCN reaction causing severe rash involving mucus membranes or skin necrosis: Unknown Has patient had a PCN reaction that required hospitalization: Unknown Has patient had a PCN reaction occurring within the last 10 years: Unknown If all of the above answers are "NO", then may proceed with Cephalosporin use.     Patient Measurements: Height: 5\' 3"  (160 cm) Weight: 62.8 kg (138 lb 7.2 oz) IBW/kg (Calculated) : 56.9 Heparin Dosing Weight: 62.6 kg  Vital Signs: Temp: 99.1 F (37.3 C) (11/03 0500) Temp Source: Axillary (11/03 0500) BP: 180/106 (11/03 0610) Pulse Rate: 96 (11/03 0547)  Labs: Recent Labs    06/05/2023 0001 05/29/2023 0830 05/16/23 0547  HGB 15.9  --  15.1  HCT 48.2  --  46.6  PLT 183  --  166  APTT 29 147*  --   LABPROT 14.2  --   --   INR 1.1  --   --   HEPARINUNFRC 0.10* >1.10* 0.46  CREATININE 1.14  --   --     Estimated Creatinine Clearance: 49.2 mL/min (by C-G formula based on SCr of 1.14 mg/dL).   Medical History: Past Medical History:  Diagnosis Date   Asthma    Avascular necrosis of bone of hip (HCC)    Blood clot in vein    Cancer (HCC)    Carotid stenosis, bilateral    Cerebrovascular accident (CVA), unspecified mechanism (HCC)    Coronary artery disease    GERD (gastroesophageal reflux disease)    Heart disease    Hypertension    PAD (peripheral artery disease) (HCC)    Pneumonia     Medications:  PTA Meds: Eliquis 5 mg BID, Baseline HL 0.10  Assessment: Pt is a 67 yo male presenting to ED reporting R leg & foot pain, which began to feel numb and cold.  Goal of Therapy:  Heparin level 0.3-0.7 units/ml aPTT 66-102  seconds Monitor platelets by anticoagulation protocol: Yes   11/03 0547 HL 0.46, therapeutic x 1  Plan:  Continue heparin infusion at 950 units/hr Recheck HL in 6 hr to confirm CBC daily while on heparin  Otelia Sergeant, PharmD, [facility] 05/16/2023 6:13 AM

## 2023-05-16 NOTE — Consult Note (Signed)
NAME:  Timothy Galvan, MRN:  [id number], DOB:  1955/07/17, LOS: 1 ADMISSION DATE:  05/25/2023, CONSULTATION DATE:  05/16/2023 REFERRING MD:  Marcelino Duster, MD, CHIEF COMPLAINT:  SVT   History of Present Illness:   67 year old male with a past medical history of alcohol and drug use who is preseting to the hospital for acute limb ischemia.  Patient was admitted on 06/09/2023 with severe pain in his right leg, noted to have severe hypertension with a mottled limb. 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 There was thrombosis of a fem-fem bypass, R. SFA, and Right anterior tibial and peroneal arteries, with a patent right posterior tibial artery. Given concern for critical limb ischemia, he was started on heparin and vascular surgery was consulted. He was taken to the OR for a right axillary to bifemoral bypass and right calf fasciotomy yesterday. Intraoperative findings suggest that ischemic limb has been ongoing for a while given behavior of the muscle when touched by electrocautery. Patient likely to require a right above knee amputation.  He was extubated in PACU and was admitted to the ICU post operatively for continued monitoring and for management. He was confused overnight. 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 Patient did not revert with 3 doses of adenosine, nor with three attempts at electrical cardioversion. He was subsequently started on IV diltiazem with improvement.  We are called to the bedside due to narrow complex tachycardia, suggestive of AVNRT. 3 doses of adenosine attempted (6 mg followed by 12 mg x2) and 3 attempts at electrical cardioversion attempted, without success. Diltiazem gtt initiated.  Pertinent  Medical History   Asthma PAD s/p fem-fem bypass CVA CAD HTN EtOH  abuse Cocaine Abuse  Significant Hospital Events: Including procedures, antibiotic start and stop dates in addition to other pertinent events   06/05/2023: admission, axillary to bifemoral bypass & fasciotomy 05/16/2023: SVT, on diltiazem gtt  Interim History / Subjective:  Patient confused this AM, unable to participate in history taking  Objective   Blood pressure 128/82, pulse (!) 152, temperature 99.1 F (37.3 C), temperature source Axillary, resp. rate 17, height 5\' 3"  (1.6 m), weight 62.8 kg, SpO2 91%.        Intake/Output Summary (Last 24 hours) at 05/16/2023 0757 Last data filed at 05/16/2023 0600 Gross per 24 hour  Intake 1698.61 ml  Output 1625 ml  Net 73.61 ml   Filed Weights   05/27/2023 0108 05/16/2023 1600  Weight: 62.6 kg 62.8 kg    Examination: Physical Exam Constitutional:      General: He is not in acute distress.    Appearance: He is ill-appearing.  Cardiovascular:     Rate and Rhythm: Regular rhythm. Tachycardia present.     Pulses: Normal pulses.  Pulmonary:     Breath sounds: Normal breath sounds. No wheezing or rales.  Abdominal:     Palpations: Abdomen is soft.  Musculoskeletal:     Right lower leg: No edema.     Left lower leg: No edema.     Comments: Cold bilateral lower extremity, both thighs warm. Fasciotomy with wound vac over the right calf  Neurological:     Mental Status: He is disoriented.     Comments: GCS 9      Assessment & Plan:   Neurology #Toxic Metabolic Encephalopathy #Pain #EtOH abuse  Presents  with altered mental status and critical limb ischemia. Has a significant history of EtOH use as well as cocaine use, and is likely going to have severe alcohol withdrawal. Started on CIWA protocol and narcotics administered for pain. GCS 9 on my exam, patient is protecting his airway.  -IV Morphine for pain -IV lorazepam for CIWA -folate and thiamine supplementatoin  Cardiovascular #HTN #CAD #PAD #SVT, likely  AVNRT  Presented with acute limb ischemia, s/p revascularization and fasciotomy. Will likely require right above knee amputation. Appreciate input from vascular surgery. Did develop narrow complex tachycardia this AM, failed to break with adenosine and electrical cardioversion, but improved with diltiazem. Suspect AVNRT based on EKG and findings of p-waves following adenosine administration. Likely precipitated by metabolic stressors following surgery. Will check lactic acid, CK, and electrolytes. Patient with a history of cocaine use and we will avoid beta-blockers for now.  -D/C Losartan -D/C Metoprolol -IV diltiazem for AVNRT control -appreciate input from cardiology and vascular surgery  Pulmonary #Presumed COPD  No active issues at the moment. Switch Incruse to duo-nebs. VBG with normal pH, mild compensated metabolic acidosis.  Gastrointestinal  Maintain NPO, on SUP  Renal #Metabolic acidosis #Rhabdomyolysis  Kidney function appears at baseline, metabolic acidosis driven by lactic acid secondary to limb ischemia. Continue to monitor electrolytes. CK's very elevated, consistent with rhabdomyolysis. Started on IV fluids, but given dead limb this is unlikely to get any better.  -continue IV fluids -monitor CK's  Endocrine  ICU glycemic protocol  Hem/Onc  Continue heparin gtt for PAD and limb ischemia. Continue aspirin.  ID  No current signs of infection, continue to monitor.   Best Practice (right click and "Reselect all SmartList Selections" daily)   Diet/type: NPO DVT prophylaxis: systemic heparin GI prophylaxis: PPI Lines: N/A Foley:  N/A Code Status:  full code Last date of multidisciplinary goals of care discussion [05/16/2023]  Labs   CBC: Recent Labs  Lab 05/20/2023 0001 05/16/23 0547  WBC 9.7 14.9*  NEUTROABS 5.8  --   HGB 15.9 15.1  HCT 48.2 46.6  MCV 90.8 91.2  PLT 183 166    Basic Metabolic Panel: Recent Labs  Lab 05/23/2023 0001  05/22/2023 2212  NA 138  --   K 4.4  --   CL 106  --   CO2 22  --   GLUCOSE 113*  --   BUN 20  --   CREATININE 1.14  --   CALCIUM 10.1  --   MG  --  2.1   GFR: Estimated Creatinine Clearance: 49.2 mL/min (by C-G formula based on SCr of 1.14 mg/dL). Recent Labs  Lab 06/03/2023 0001 05/16/23 0547  WBC 9.7 14.9*    Liver Function Tests: No results for input(s): "AST", "ALT", "ALKPHOS", "BILITOT", "PROT", "ALBUMIN" in the last 168 hours. No results for input(s): "LIPASE", "AMYLASE" in the last 168 hours. No results for input(s): "AMMONIA" in the last 168 hours.  ABG No results found for: "PHART", "PCO2ART", "PO2ART", "HCO3", "TCO2", "ACIDBASEDEF", "O2SAT"   Coagulation Profile: Recent Labs  Lab 06/11/2023 0001  INR 1.1    Cardiac Enzymes: No results for input(s): "CKTOTAL", "CKMB", "CKMBINDEX", "TROPONINI" in the last 168 hours.  HbA1C: Hgb A1c MFr Bld  Date/Time Value Ref Range Status  06/01/2022 02:31 PM 6.3 (H) 4.8 - 5.6 % Final    Comment:    (NOTE)         Prediabetes: 5.7 - 6.4         Diabetes: >6.4  Glycemic control for adults with diabetes: <7.0     CBG: Recent Labs  Lab 06/10/2023 1557  GLUCAP 120*    Review of Systems:   Unable to obtain  Past Medical History:  He,  has a past medical history of Asthma, Avascular necrosis of bone of hip (HCC), Blood clot in vein, Cancer (HCC), Carotid stenosis, bilateral, Cerebrovascular accident (CVA), unspecified mechanism (HCC), Coronary artery disease, GERD (gastroesophageal reflux disease), Heart disease, Hypertension, PAD (peripheral artery disease) (HCC), and Pneumonia.   Surgical History:   Past Surgical History:  Procedure Laterality Date   FEMORAL BYPASS     MANDIBLE SURGERY     WRIST SURGERY       Social History:   reports that he has been smoking cigarettes. He has never used smokeless tobacco. He reports current alcohol use of about 12.0 standard drinks of alcohol per week. He reports  current drug use. Drug: Cocaine.   Family History:  His family history is not on file.   Allergies Allergies  Allergen Reactions   Penicillins     Has patient had a PCN reaction causing immediate rash, facial/tongue/throat swelling, SOB or lightheadedness with hypotension: Unknown Has patient had a PCN reaction causing severe rash involving mucus membranes or skin necrosis: Unknown Has patient had a PCN reaction that required hospitalization: Unknown Has patient had a PCN reaction occurring within the last 10 years: Unknown If all of the above answers are "NO", then may proceed with Cephalosporin use.      Home Medications  Prior to Admission medications   Medication Sig Start Date End Date Taking? Authorizing Provider  amLODipine (NORVASC) 5 MG tablet Take 5 mg by mouth daily. 05/05/23  Yes [provider]  aspirin EC 81 MG tablet Take 1 tablet (81 mg total) by mouth daily. Swallow whole. 06/13/22   Love, Evlyn Kanner, PA-C  atorvastatin (LIPITOR) 40 MG tablet Take 1 tablet (40 mg total) by mouth at bedtime. 06/11/22   Love, Evlyn Kanner, PA-C  clopidogrel (PLAVIX) 75 MG tablet Take 1 tablet (75 mg total) by mouth daily. 06/11/22   Love, Evlyn Kanner, PA-C  diclofenac Sodium (VOLTAREN) 1 % GEL Apply 4 g topically 4 (four) times daily. 08/13/22   [provider]  ELIQUIS 5 MG TABS tablet Take 5 mg by mouth 2 (two) times daily.    [provider]  gabapentin (NEURONTIN) 400 MG capsule TAKE 1 CAPSULE BY MOUTH 3 TIMES  DAILY 02/16/23   [name], Drema Pry, MD  Ipratropium-Albuterol (COMBIVENT) 20-100 MCG/ACT AERS respimat Inhale 1 puff into the lungs every 6 (six) hours as needed for wheezing.    [provider]  isosorbide mononitrate (IMDUR) 120 MG 24 hr tablet Take 120 mg by mouth daily. 06/26/22   [provider]  JARDIANCE 10 MG TABS tablet Take 10 mg by mouth daily. 11/05/22   [provider]  lidocaine (LIDODERM) 5 % Place 2 patches onto the skin  daily. Remove & Discard patch within 12 hours or as directed by MD 04/21/23   [name], Drema Pry, MD  losartan (COZAAR) 25 MG tablet Take 1 tablet (25 mg total) by mouth daily. 11/27/22   [name], Drema Pry, MD  magnesium oxide (MAG-OX) 400 MG tablet Take 1 tablet (400 mg total) by mouth at bedtime. 06/11/22   Love, Evlyn Kanner, PA-C  Melatonin 5 MG CAPS Take 1 capsule (5 mg total) by mouth daily. 09/28/22   [name], Drema Pry, MD  Multiple Vitamin (MULTIVITAMIN WITH MINERALS)  TABS tablet Take 1 tablet by mouth daily. 06/12/22   Love, Evlyn Kanner, PA-C  nitroGLYCERIN (NITROSTAT) 0.4 MG SL tablet Place 1 tablet (0.4 mg total) under the tongue every 5 (five) minutes as needed for chest pain. 02/04/19   Jeanmarie Plant, MD  pantoprazole (PROTONIX) 40 MG tablet Take 1 tablet (40 mg total) by mouth daily. 06/12/22   Love, Evlyn Kanner, PA-C  thiamine (VITAMIN B1) 100 MG tablet Take 1 tablet (100 mg total) by mouth daily. 06/12/22   Love, Evlyn Kanner, PA-C  traZODone (DESYREL) 50 MG tablet 1 at bedtime for sleep 02/19/23   Jetty Duhamel D, MD  umeclidinium bromide (INCRUSE ELLIPTA) 62.5 MCG/ACT AEPB Inhale 1 puff into the lungs daily. 06/12/22   Love, Evlyn Kanner, PA-C  Vitamin D, Ergocalciferol, (DRISDOL) 1.25 MG (50000 UNIT) CAPS capsule Take 1 capsule (50,000 Units total) by mouth every 7 (seven) days. Patient taking differently: Take 50,000 Units by mouth every 30 (thirty) days. 06/15/22   Jacquelynn Cree, PA-C     Critical care time: 60 minutes    Raechel Chute, MD [facility] 05/16/2023 8:07 PM

## 2023-05-16 NOTE — Progress Notes (Signed)
MD made aware patient sustaining SVT, agitated. Reviewed recent vitals and recent medications given. New orders placed. Rns trying to obtain BP reading. O2 sensor reading intermittently, >95% with good waveform intermittently.  HR increased to upper 170s/180s, Critical Care NP aware consulting with hospitilaist.   Patient given 6 & 12 mg adenosine.  2 mg Versed, and 100 fent.  Cardioverted. 2 mg Versed given. Cadioverted again x2. Adenosine, 12 mg pushed. Patient still in SVT. EKG obtain in chart. Strips placed in chart. Patient given 10mg  Cardizem push and orders to start Cardizem drip.  Patient agitated, thrashing in bed, tense, moaning/groaning. MD aware. PRN pain medications given per MD. Minimal relief, MD aware.   Patient unable to take POs due to decreased alertness. MD aware.  Unable to doppler pulses, Vascular aware. Vascular at bedside. Vascular unable to doppler pulses at bedside. U/S vasculature. Minimally blood flow bilaterally to lower extremities. Family updated at bedside by vascular.  CIWA score 11, hold Ativan per MD.   Cardizem initiated at 2.5 per MD, MD to modify order. Titrated per MD verbal orders.  Per cards, titrate down by 2.5 Q15 over an hour to wean down Cardizem with concurrent switch to Amio.  CIWA score 11, hold Ativan per MD.   Family asked to help maintain decreased stimulation to help with keeping patient comfortable. Heart and Blood pressure increase with increased stimulation. Patient  appears restless and in pain with stimulation. Family educated, somewhat compliant.  PRN pain medications given per orders.  Patient agitated, thrashing in bed, groaning. Patient alert to voice, open eyes to name. Incomprehensible speech, unable to follow commands. No additional pain medication available at this time. Hold ativan per MD. MD aware of patient presentation. No new orders.  MD made aware of lactic.

## 2023-05-16 NOTE — Progress Notes (Signed)
Patients HR up to 150's/  Steward Drone, NP notified. Continue to monitor otherwise no new orders at this time.

## 2023-05-16 NOTE — Progress Notes (Signed)
Patient is confused and does not reorient or follow commands. Patient found pulling on PIV and foley catheter. Mittens applied to BL hands for safety .

## 2023-05-16 NOTE — Consult Note (Signed)
Consultation Note Date: 05/16/2023   Patient Name: Timothy Galvan  DOB: 1955-08-04  MRN: [id number]  Age / Sex: 67 y.o., male  PCP: [facility] Referring Physician: Marcelino Duster, MD  Reason for Consultation: Establishing goals of care   HPI/Brief Hospital Course: 67 y.o. male  with past medical history of CAD, hypertension, tobacco, EtOH and cocaine use, colon cancer, PAF on Eliquis, severe PAD with multiple revascularizations, CVA 04/2022 admitted on 05/30/2023 with critical limb ischemia.  Vascular surgical team urgently consulted.  Vascular procedure 05/30/2023: 1) right axilary - right common femoral arterial bypass 2) thrombectomy right-to-left femoral-femoral bypass 3) right calf four compartment fasciotomies.  Overnight developed unstable arrhythmia requiring chemical and electrical cardioversion  11/3 vascular surgery recommendations for comfort measures due to overall very poor prognosis related to concern of fem-fem bypass appearing to be rethrombused, doppler flow in popliteal arteries only Not an appropriate candidate at this time for further vascular interventions  Palliative medicine was consulted for assisting with goals of care conversations.  Subjective:  Extensive chart review has been completed prior to meeting patient including labs, vital signs, imaging, progress notes, orders, and available advanced directive documents from current and previous encounters.  Visited with Timothy Galvan at his bedside he is resting in bed with his eyes closed, does acknowledge my presence in room but unable to follow commands or engage in conversations.  Met with at minimum 25 family members including 1 daughter present in person, 1 daughter via speaker phone and 1 son via speaker phone with multiple other extended family members outside of ICU in waiting room.  Confirmed with daughter present it was okay to proceed with goals of  care conversations in front of all visitors.  Introduced myself as a Publishing rights manager as a member of the palliative care team. Explained palliative medicine is specialized medical care for people living with serious illness. It focuses on providing relief from the symptoms and stress of a serious illness. The goal is to improve quality of life for both the patient and the family.   Reviewed earlier conversations had with family and vascular surgical team with concern for ongoing limb ischemia with limited to no appropriate interventions.  Attempted to elicit goals of care.  We discussed CODE STATUS and the difference between full code versus DO NOT RESUSCITATE.  We also discussed the difference between continuing with current plan of care versus shifting her focus towards comfort care. Explained Timothy Galvan would no longer receive aggressive medical interventions such as continuous vital signs, lab work, radiology testing, or medications not focused on comfort. All care would focus on how the patient is looking and feeling. This would include management of any symptoms that may cause discomfort, pain, shortness of breath, cough, nausea, agitation, anxiety, and/or secretions etc. Symptoms would be managed with medications and other non-pharmacological interventions such as spiritual support if requested, repositioning, music therapy, or therapeutic listening. Family verbalized understanding and appreciation.   Answered and addressed the many questions and concerns from various family members.  At that time turned my conversations to daughter that was present as well as her siblings via speaker phone as they confirmed Timothy Galvan is not currently married, they are the 3 siblings but they do have 1 other brother but they have not had contact with him in quite some time.  Available children discussed amongst themselves and at this time wish to continue with full CODE STATUS as well as continuing with  current treatment plan.  They share they are hopeful for Timothy Galvan's recovery.  Discussed with family Timothy Galvan is high risk for cardiac arrest as well as continued decompensation and in the event it is apparent that Timothy Galvan is suffering they would be alerted.  Concern related to families complete understanding of the severity of Timothy Galvan condition and his high risk for ongoing decompensation.   I discussed importance of continued conversations with family/support persons and all members of their medical team regarding overall plan of care and treatment options ensuring decisions are in alignment with patients goals of care.  All questions/concerns addressed. Emotional support provided to patient/family/support persons. PMT will continue to follow and support patient as needed.  Objective: Primary Diagnoses: Present on Admission:  CAD (coronary artery disease)  PAD (peripheral artery disease) (HCC)  COPD (chronic obstructive pulmonary disease) (HCC)  HTN (hypertension)  Ischemic leg   Vital Signs: BP (!) 136/96   Pulse 94   Temp 97.6 F (36.4 C) (Oral)   Resp (!) 23   Ht 5\' 3"  (1.6 m)   Wt 62.8 kg   SpO2 (!) 65%   BMI 24.53 kg/m  Pain Scale: CPOT   Pain Score: Asleep   IO: Intake/output summary:  Intake/Output Summary (Last 24 hours) at 05/16/2023 1800 Last data filed at 05/16/2023 0700 Gross per 24 hour  Intake 75.14 ml  Output 625 ml  Net -549.86 ml    LBM: Last BM Date :  (PTA) Baseline Weight: Weight: 62.6 kg Most recent weight: Weight: 62.8 kg      Assessment and Plan  SUMMARY OF RECOMMENDATIONS   Full Code-Full Scope Ongoing GOC needed PMT to continue to follow for ongoing needs and support  Palliative Prophylaxis:   Bowel Regimen, Delirium Protocol and Frequent Pain Assessment  Discussed With: Primary team, vascular surgery and nursing staff   Thank you for this consult and allowing Palliative Medicine to participate in the care of Timothy  W. Galvan. Palliative medicine will continue to follow and assist as needed.   Time Total: 75 minutes  Time spent includes: Detailed review of medical records (labs, imaging, vital signs), medically appropriate exam (mental status, respiratory, cardiac, skin), discussed with treatment team, counseling and educating patient, family and staff, documenting clinical information, medication management and coordination of care.   Signed by: Leeanne Deed, DNP, AGNP-C Palliative Medicine    Please contact Palliative Medicine Team phone at [phone] for questions and concerns.  For individual provider: See Loretha Stapler

## 2023-05-16 NOTE — Consult Note (Signed)
Cardiology Consultation:   Patient ID: Timothy Galvan; [id number]; Dec 25, 1955   Admit date: 05/26/2023 Date of Consult: 05/16/2023  Primary Care Provider: [facility] Primary Cardiologist: Agbor-Etang Primary Electrophysiologist:  None   Patient Profile:   Timothy Galvan is a 68 y.o. 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 Clide Dales.  History of Present Illness:   Timothy Galvan was previously followed by Kateri Mc and [facility] cardiology, establishing care with [facility] cardiology in 01/2023.  He has undergone extensive cardiac testing in the past as outlined below.  Most recent cardiac cath from 02/08/2019 at [facility] showed 50% mid LAD stenosis with an FFR of 0.86, 50% ostial LCx stenosis, and a nondominant RCA with medical therapy recommended.  Most recent ischemic evaluation via Lexiscan MPI on 07/18/2021 showed a moderate in size, moderate minimally reversible perfusion abnormality involving the apical anterior, mid anterior, and basal anterior segments.  This finding was consistent with probable scar with minimal peri-infarct ischemia.  Apical inferior and apical septal defects were less well-seen when compared to prior study.  LVEF 58%.  Aortic atherosclerosis and significant coronary artery calcification noted on CT imaging.  He was admitted in 05/2022 with CVA.  MRI of the brain showed acute to subacute infarcts in the left basal ganglia and right pons without hemorrhage or mass effect.  There was also unchanged occlusion of  the right ICA from the neck through the communicating segment where there was reconstitution via collateral flow as well as unchanged severe stenosis of the left intracranial ICA.  Echo at that time showed an EF of 45 to 50%, mild concentric LVH, normal LV diastolic function parameters, normal RV systolic function and ventricular cavity size, mildly elevated RVSP estimated at 44.5 mmHg, mild biatrial enlargement, mild mitral regurgitation, and aortic valve sclerosis without evidence of stenosis.  EF was consistent with readings by echo at outside institutions.  Outpatient cardiac monitoring showed a predominant rhythm of sinus with several episodes of PSVT, some of which were sustained as well as a few episodes of atrial fib/flutter with recommendation for the patient to be evaluated by cardiology regarding candidacy for anticoagulation.  He was subsequently evaluated by [facility] cardiology in 12/2022 with recommendation to stop aspirin echo but overall and start apixaban.  Upon establishing with our office, it was unclear if he was taking apixaban or not.  He was again advised to stop aspirin and clopidogrel and start apixaban.  Ongoing recommendation for polysubstance abuse cessation was recommended.  Echo in 02/2023 showed an EF of 45 to 50%, global hypokinesis, normal RV systolic function and ventricular cavity size, mildly to moderately dilated left atrium, mildly dilated right atrium, mild mitral regurgitation, tricuspid aortic valve with sclerosis without evidence of stenosis, and an estimated right atrial pressure of 3 mmHg.  He was admitted to [facility] on 06/01/2023 with acute right lower extremity limb ischemia.  Aortobifemoral CTA showed thrombosis of the infrarenal abdominal aorta and lower extremity inflow bilaterally with reconstitution of lower extremity arterial outflow via posterior collaterals.  Superimposed high-grade stenoses at  the junction of the collateral vessels and common femoral arteries  bilaterally, thrombosis of femorofemoral bypass graft, thrombosis of right SFA with reconstitution of the right popliteal artery via geniculate collaterals, thrombosis of the right anterior tibial and peroneal arteries proximally with patency of the right posterior tibial artery to the level of the distal diaphysis of the tibia with poor inflow and limited opacification precluding evaluation of distal patency.  Widely patent left lower extremity arterial outflow and runoff with thrombosis of the left dorsalis pedis artery.  Other findings included avascular necrosis of the femoral heads bilaterally without articular collapse and acute to subacute T12 superior endplate fracture.  CT head on 11/2, performed for mental status change unknown etiology showed no emergent finding with chronic perforator and right frontal cortex infarcts.  He underwent right axillofemoral bypass with successful thrombectomy of femorofemoral bypass and right calf 4 compartment fasciotomies.  Post procedure, the patient had multiple brief runs of SVT requiring multiple injections of IV metoprolol.  This morning around 0600, he developed sustained SVT with rates in the 150s bpm initially, subsequently into the 180s bpm. He was given adenosine 6 mg, 12 mg, 12 mg without improvement.  He underwent unsuccessful DCCV x 3 for possible SVT with subsequent successful conversion to sinus rhythm with IV Cardizem.  Following this, he continued to have brief runs of atrial tachycardia with development of Afib around 10:03 AM.  Currently in sinus rhythm with continued brief atrial runs.  Altered mental status of uncertain etiology remains without meaningful interaction. Vascular surgery Doppler without flow in the BLE today. No plans from vascular surgery perspective for further surgery.   Notable Labs: Urine drug screen positive for cocaine, lactic acid 3.6, CK 10,993, magnesium 2.0, potassium 4.6, BUN 23, serum creatinine 1.04, WBC 14.9, Hgb 15.1, PLT  166    Past Medical History:  Diagnosis Date   Asthma    Avascular necrosis of bone of hip (HCC)    Blood clot in vein    Cancer (HCC)    Carotid stenosis, bilateral    Cerebrovascular accident (CVA), unspecified mechanism (HCC)    Coronary artery disease    GERD (gastroesophageal reflux disease)    Heart disease    Hypertension    PAD (peripheral artery disease) (HCC)    Pneumonia     Past Surgical History:  Procedure Laterality Date   FEMORAL BYPASS     MANDIBLE SURGERY     WRIST SURGERY       Home Meds: Prior to Admission medications   Medication Sig Start Date End Date Taking? Authorizing Provider  amLODipine (NORVASC) 5 MG tablet Take 5 mg by mouth daily. 05/05/23  Yes [provider]  aspirin EC 81 MG tablet Take 1 tablet (81 mg total) by mouth daily. Swallow whole. 06/13/22   Love, Evlyn Kanner, PA-C  atorvastatin (LIPITOR) 40 MG tablet Take 1 tablet (40 mg total) by mouth at bedtime. 06/11/22   Love, Evlyn Kanner, PA-C  clopidogrel (PLAVIX) 75 MG tablet Take 1 tablet (75 mg total) by mouth daily. 06/11/22   Love, Evlyn Kanner, PA-C  diclofenac Sodium (VOLTAREN) 1 % GEL Apply 4 g topically 4 (four) times daily. 08/13/22   [provider]  ELIQUIS 5 MG TABS tablet Take 5 mg by mouth 2 (two) times daily.    [provider]  gabapentin (NEURONTIN) 400 MG capsule TAKE 1 CAPSULE BY MOUTH 3 TIMES  DAILY 02/16/23   [name], Drema Pry, MD  Ipratropium-Albuterol (COMBIVENT) 20-100 MCG/ACT  AERS respimat Inhale 1 puff into the lungs every 6 (six) hours as needed for wheezing.    [provider]  isosorbide mononitrate (IMDUR) 120 MG 24 hr tablet Take 120 mg by mouth daily. 06/26/22   [provider]  JARDIANCE 10 MG TABS tablet Take 10 mg by mouth daily. 11/05/22   [provider]  lidocaine (LIDODERM) 5 % Place 2 patches onto the skin daily. Remove & Discard patch within 12 hours or as directed by MD 04/21/23   [name], Drema Pry, MD  losartan  (COZAAR) 25 MG tablet Take 1 tablet (25 mg total) by mouth daily. 11/27/22   [name], Drema Pry, MD  magnesium oxide (MAG-OX) 400 MG tablet Take 1 tablet (400 mg total) by mouth at bedtime. 06/11/22   Love, Evlyn Kanner, PA-C  Melatonin 5 MG CAPS Take 1 capsule (5 mg total) by mouth daily. 09/28/22   [name], Drema Pry, MD  Multiple Vitamin (MULTIVITAMIN WITH MINERALS) TABS tablet Take 1 tablet by mouth daily. 06/12/22   Love, Evlyn Kanner, PA-C  nitroGLYCERIN (NITROSTAT) 0.4 MG SL tablet Place 1 tablet (0.4 mg total) under the tongue every 5 (five) minutes as needed for chest pain. 02/04/19   Jeanmarie Plant, MD  pantoprazole (PROTONIX) 40 MG tablet Take 1 tablet (40 mg total) by mouth daily. 06/12/22   Love, Evlyn Kanner, PA-C  thiamine (VITAMIN B1) 100 MG tablet Take 1 tablet (100 mg total) by mouth daily. 06/12/22   Love, Evlyn Kanner, PA-C  traZODone (DESYREL) 50 MG tablet 1 at bedtime for sleep 02/19/23   Jetty Duhamel D, MD  umeclidinium bromide (INCRUSE ELLIPTA) 62.5 MCG/ACT AEPB Inhale 1 puff into the lungs daily. 06/12/22   Love, Evlyn Kanner, PA-C  Vitamin D, Ergocalciferol, (DRISDOL) 1.25 MG (50000 UNIT) CAPS capsule Take 1 capsule (50,000 Units total) by mouth every 7 (seven) days. Patient taking differently: Take 50,000 Units by mouth every 30 (thirty) days. 06/15/22   Jacquelynn Cree, PA-C    Inpatient Medications: Scheduled Meds:  aspirin EC  81 mg Oral Daily   atorvastatin  40 mg Oral QHS   Chlorhexidine Gluconate Cloth  6 each Topical Daily   empagliflozin  10 mg Oral Daily   folic acid  1 mg Oral Daily   ipratropium-albuterol  3 mL Nebulization QID   isosorbide mononitrate  120 mg Oral Daily   magnesium oxide  400 mg Oral QHS   melatonin  5 mg Oral QHS   multivitamin with minerals  1 tablet Oral Daily   nicotine  21 mg Transdermal Daily   mouth rinse  15 mL Mouth Rinse 4 times per day   pantoprazole  40 mg Oral Daily   thiamine  100 mg Oral Daily   Or   thiamine  100 mg Intravenous Daily    Continuous Infusions:  amiodarone     amiodarone     heparin 950 Units/hr (05/16/23 0700)   PRN Meds: acetaminophen **OR** acetaminophen, hydrALAZINE, HYDROcodone-acetaminophen, LORazepam **OR** LORazepam, morphine injection, nitroGLYCERIN, ondansetron **OR** ondansetron (ZOFRAN) IV, mouth rinse, traZODone  Allergies:   Allergies  Allergen Reactions   Penicillins     Has patient had a PCN reaction causing immediate rash, facial/tongue/throat swelling, SOB or lightheadedness with hypotension: Unknown Has patient had a PCN reaction causing severe rash involving mucus membranes or skin necrosis: Unknown Has patient had a PCN reaction that required hospitalization: Unknown Has patient had a PCN reaction occurring within the last 10 years: Unknown If all of  the above answers are "NO", then may proceed with Cephalosporin use.     Social History:   Social History   Socioeconomic History   Marital status: Single    Spouse name: Not on file   Number of children: Not on file   Years of education: Not on file   Highest education level: Not on file  Occupational History   Not on file  Tobacco Use   Smoking status: Every Day    Current packs/day: 1.00    Types: Cigarettes   Smokeless tobacco: Never   Tobacco comments:    Currently smoking 1ppd.  At the most he smoked 2 ppd in the pack.  Trying to quit.  02/19/2023 hfb  Vaping Use   Vaping status: Some Days   Substances: Flavoring  Substance and Sexual Activity   Alcohol use: Yes    Alcohol/week: 12.0 standard drinks of alcohol    Types: 12 Cans of beer per week   Drug use: Yes    Types: Cocaine    Comment: last month   Sexual activity: Not on file  Other Topics Concern   Not on file  Social History Narrative   Not on file   Social Determinants of Health   Financial Resource Strain: Low Risk  (05/29/2021)   Received from [facility], [facility]   Overall Financial Resource Strain (CARDIA)    Difficulty of  Paying Living Expenses: Not very hard  Food Insecurity: No Food Insecurity (06/04/2022)   Hunger Vital Sign    Worried About Running Out of Food in the Last Year: Never true    Ran Out of Food in the Last Year: Never true  Transportation Needs: No Transportation Needs (05/29/2021)   Received from [facility], [facility]   [facility] - Transportation    Lack of Transportation (Medical): No    Lack of Transportation (Non-Medical): No  Physical Activity: Not on file  Stress: Not on file  Social Connections: Not on file  Intimate Partner Violence: Not on file     Family History:   Family History  Problem Relation Age of Onset   Diabetes Mother    Diabetes Father    Cancer Father    Gout Father    Diabetes Sister    Hypertension Brother    Diabetes Brother    Cancer Brother    Gout Brother    Heart disease Brother     ROS:  Review of Systems  Unable to perform ROS: Mental status change      Physical Exam/Data:   Vitals:   05/16/23 1000 05/16/23 1005 05/16/23 1010 05/16/23 1015  BP: 128/83     Pulse: (!) 101 (!) 105 (!) 103 (!) 102  Resp: 20 (!) 29 (!) 26 (!) 25  Temp:      TempSrc:      SpO2: 93% (!) 83% 94% 96%  Weight:      Height:        Intake/Output Summary (Last 24 hours) at 05/16/2023 1103 Last data filed at 05/16/2023 0700 Gross per 24 hour  Intake 1707.84 ml  Output 1625 ml  Net 82.84 ml   Filed Weights   06/10/2023 0108 06/12/2023 1600  Weight: 62.6 kg 62.8 kg   Body mass index is 24.53 kg/m.   Physical Exam: General: Chronically ill appearing, in no acute distress. Mittens in place. Tachypneic.   Head: Normocephalic, atraumatic, sclera non-icteric, no xanthomas, nares without discharge.   Neck: JVD  not elevated. Lungs: Clear bilaterally to auscultation without wheezes, rales, or rhonchi to anterior auscultation. Breathing is unlabored. Heart: Tachycardic with S1 S2. No murmurs, rubs, or gallops appreciated. Abdomen: Soft, non-tender,  non-distended with normoactive bowel sounds. No hepatomegaly. No rebound/guarding. No obvious abdominal masses. Msk:  Strength and tone appear diminished for age. Extremities: Fasciotomy noted and dressed. Neuro: Nonverbal. Moves all extremities spontaneously. Psych:  Nonverbal.   EKG:  The EKG was personally reviewed and demonstrates: 11/2 - sinus tachycardia, 107 bpm, right axis deviation, rare PVC, prior anteroseptal infarct, nonspecific ST/T changes. 11/3 - SVT, 187 bpm, right axis deviation, nonspecific st/t changes Telemetry:  Telemetry was personally reviewed and demonstrates: Multiple short runs of SVT on 11/1 with sustained SVT into the 180s at 0600 on 11/3 with conversion to sinus rhythm around 0800 followed by continued brief paroxysms of atrial runs thereafter with Afib developing at 10:03, currently in sinus rhythm with short paroxysms of atrial runs  Weights: Filed Weights   06/01/2023 0108 05/28/2023 1600  Weight: 62.6 kg 62.8 kg    Relevant CV Studies:  2D echo 03/01/2023: 1. Left ventricular ejection fraction, by estimation, is 45 to 50%. The  left ventricle has mildly decreased function. The left ventricle  demonstrates global hypokinesis. Left ventricular diastolic parameters are  indeterminate.   2. Right ventricular systolic function is normal. The right ventricular  size is normal.   3. Left atrial size was mild to moderately dilated.   4. Right atrial size was mildly dilated.   5. The mitral valve is normal in structure. Mild mitral valve  regurgitation.   6. The aortic valve is tricuspid. Aortic valve regurgitation is not  visualized. Aortic valve sclerosis is present, with no evidence of aortic  valve stenosis.   7. The inferior vena cava is normal in size with greater than 50%  respiratory variability, suggesting right atrial pressure of 3 mmHg.  __________  Event monitor 06/2022: Predominant rhythm is sinus with heart rate ranging from 45 bpm up to 123 bpm  and average heart rate 63 bpm. There were no pauses. There were several episodes of PSVT, some of which were sustained. A few episodes of atrial flutter with variable conduction were also noted, heart rate ranging 90 to 100 bpm. Episodes of paroxysmal atrial fibrillation with RVR were noted, some of which were sustained. Brief NSVT.   This monitor was requested by hospitalist service after admission with stroke.  Please forward this report to the ordering provider as well as PCP.  If patient is an appropriate candidate, cardiology referral and discussion regarding candidacy for anticoagulation would be recommended. __________  2D echo 06/02/2022: 1. Left ventricular ejection fraction, by estimation, is 45 to 50%. The  left ventricle has mildly decreased function. The left ventricle  demonstrates regional wall motion abnormalities (see scoring  diagram/findings for description). There is mild  concentric left ventricular hypertrophy. Left ventricular diastolic  parameters were normal.   2. Right ventricular systolic function is normal. The right ventricular  size is normal. There is mildly elevated pulmonary artery systolic  pressure. The estimated right ventricular systolic pressure is 44.5 mmHg.   3. Left atrial size was mildly dilated.   4. Right atrial size was mildly dilated.   5. The mitral valve is grossly normal. Mild mitral valve regurgitation.   6. The aortic valve is tricuspid. Aortic valve regurgitation is not  visualized. Aortic valve sclerosis/calcification is present, without any  evidence of aortic stenosis. Aortic valve mean gradient measures  3.0 mmHg.   7. The inferior vena cava is normal in size with greater than 50%  respiratory variability, suggesting right atrial pressure of 3 mmHg.   Comparison(s): Prior images unable to be directly viewed. Patient with  reported history of cardiomyopathy followed at [facility] (fluctuating LVEF, 45%  in 2020 and 55% in 2022).   __________  Eugenie Birks MPI 07/18/2021 [facility]): - Abnormal myocardial perfusion study  - There is a moderate in size, moderate minimally reversible perfusion abnormality involving the apical anterior, mid anterior, and basal anterior segments. This is consistent with probable scar and minimal peri-infarct ischemia. Apical inferior and apical septal defects are less well seen than on prior  - Left ventricular systolic function is normal. Post stress the ejection fraction is  58%. The left ventricle is normal in size.  - Significant coronary calcifications were noted on the attenuation CT  - Incidental CT findings: Apical bullous changes are noted at the top of the right lung. There is aortic calcification.  __________  2D echo 07/09/2021 [facility]): Summary   1. The left ventricle is normal in size with normal wall thickness.    2. The left ventricular systolic function is normal, LVEF is visually  estimated at 55-60%.    3. The aortic valve is trileaflet with mildly thickened leaflets with normal  excursion.   4. The left atrium is mildly dilated in size.    5. The right ventricle is normal in size, with normal systolic function.  __________  2D echo 08/20/2020 [facility]): Summary   1. The left ventricular systolic function is normal, LVEF is visually  estimated at 55%.    2. The aortic valve is trileaflet with mildly thickened leaflets with normal  excursion.   3. The right ventricle is normal in size, with normal systolic function.  __________  2D echo 05/26/2019 [facility]): Summary   1. Mildly decreased left ventricular systolic function, ejection fraction  45%.   2. Aortic sclerosis.    3. Normal right ventricular size and systolic function.  __________  [facility] 02/08/2019 [facility]): Findings:  1. Coronary artery disease including 50% mid-LAD stenosis (FFR = 0.86) and  50% ostial circumflex stenosis (Pd/Pa = 0.99). The RCA is non-dominant.  2. Elevated left ventricular filling pressures (LVEDP = 17 mm  Hg).  3. Reduced left ventricular systolic function (estimated LVEF = 30%).   Recommendations:  1. Aggressive secondary prevention  2. Started metoprolol and lisinopril due to significantly reduced ejection  fraction.  __________  Eugenie Birks MPI 01/25/2019 [facility]): Impressions:  - Abnormal myocardial perfusion study  - There is a moderate in size, moderate in severity, minimally reversible defect involving the apical, apical inferior, apical anterior, apical septal, mid anterior and basal anterior segments. This is consistent with probable scar and minimal peri-infarct ischemia.  - Post stress: Global systolic function is mildly reduced. The ejection fraction calculated at 44%.  - Coronary calcifications are noted on the attenuation CT  - CT findings show emphysematous changes of the lungs  __________  Eugenie Birks MPI 12/21/2016 [facility]): Impressions:  - Normal myocardial perfusion study  - No evidence for significant ischemia or scar is noted.  - Post stress: Global systolic function is borderline. The ejection  fraction calculated at 51%.  - Coronary calcifications are noted  - Also noted on attenuation CT are large bullae in the right upper lobe of  the lungs and diffuse emphysematous changes  __________  Stress echo 11/24/2016 [facility]): Conclusions: Non-diagnostic Exercise Stress Echocardiogram   -  Poor  exercise capacity, with no exercise induced chest pain at an  estimated workload of 2.3 METS.  -  Normal ECG, rhythm and BP response to exercise at 77% of the  age-predicted maximal HR without diagnostic ST segment changes  -  Inconclusive exercise stress echocardiogram due to inadequate heart  rate at peak exercise  __________  Eugenie Birks MPI 10/02/2015 [facility]): Impressions:  - Normal myocardial perfusion study   - No evidence for significant ischemia or scar is noted.   - Post stress:  Global systolic function is normal.  The ejection fraction  calculated at 54%.  __________  2D  echo 10/01/2015 [facility]):  Borderline left ventricular systolic function, ejection fraction 50 to  55%   Normal right ventricular systolic function  __________  Nuclear stress test 03/25/2011 ([facility]): FINAL COMMENTS   1. Negative for regadenoson induced ischemia.   2. Very small, mild fixed inferoapical region likely diaphragm  attenuation artifact.   3. Mild global hypokinesis. Mild decreased LVEF of 45%; prominent LV  cavity.  __________  See Care Everywhere in Epic for more remote studies    Laboratory Data:  Chemistry Recent Labs  Lab 05/27/2023 0001 05/16/23 0821  NA 138 136  K 4.4 4.6  CL 106 105  CO2 22 20*  GLUCOSE 113* 163*  BUN 20 23  CREATININE 1.14 1.04  CALCIUM 10.1 9.0  GFRNONAA >60 >60  ANIONGAP 10 11    No results for input(s): "PROT", "ALBUMIN", "AST", "ALT", "ALKPHOS", "BILITOT" in the last 168 hours. Hematology Recent Labs  Lab 06/09/2023 0001 05/16/23 0547  WBC 9.7 14.9*  RBC 5.31 5.11  HGB 15.9 15.1  HCT 48.2 46.6  MCV 90.8 91.2  MCH 29.9 29.5  MCHC 33.0 32.4  RDW 14.0 14.1  PLT 183 166   Cardiac EnzymesNo results for input(s): "TROPONINI" in the last 168 hours. No results for input(s): "TROPIPOC" in the last 168 hours.  BNPNo results for input(s): "BNP", "PROBNP" in the last 168 hours.  DDimer No results for input(s): "DDIMER" in the last 168 hours.  Radiology/Studies:  CT HEAD WO CONTRAST ( )  Result Date: 05/23/2023 IMPRESSION: 1. No emergent finding . 2. Chronic perforator and right frontal cortex infarcts. Electronically Signed   By: Tiburcio Pea M.D.   On: 06/10/2023 09:00   CT Angio Aortobifemoral W and/or Wo Contrast  Result Date: 05/23/2023 IMPRESSION: VASCULAR Thrombosis of the infrarenal abdominal aorta and lower extremity inflow bilaterally with reconstitution of lower extremity arterial outflow via posterior collaterals. Superimposed high-grade stenoses at the juncture of the collateral vessels and common femoral arteries  bilaterally. Thrombosis of a fem-fem bypass graft. Thrombosis of the right superficial femoral artery. Reconstitution of the right popliteal artery via geniculate collaterals. Thrombosis of the a right anterior tibial and peroneal arteries proximally. Patency of the right posterior tibial artery to the level of the distal diaphysis of the tibia with poor inflow and limited opacification precluding evaluation of distal patency. Widely patent left lower extremity arterial outflow and runoff. Thrombosis of the left dorsalis pedis artery. 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 Acute to subacute T12 superior endplate fracture.  No retropulsion. Electronically Signed   By: Helyn Numbers M.D.   On: 05/27/2023 02:14    Assessment and Plan:   1.  SVT: -Multiple paroxysms of SVT on 11/2 with numerous brief episodes overnight, developing sustained SVT around 0600 on 11/2 with unsuccessful DCCV x 3 with conversion to sinus rhythm with IV diltiazem with continued  short atrial runs thereafter, now in Afib as of 10:03 -Transition off diltiazem drip with initiation of amiodarone gtt (without bolus due to relative hypotension - discussed with RN) -Potassium and magnesium at goal -Check TSH  2.  Nonobstructive CAD: -ASA, atorvastatin, Imdur -No plans for current inpatient ischemic evaluation  3.  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   PAF: -Developed Afib at 10:03 AM this morning following SVT as outlined above  -Currently in sinus rhythm with short atrial runs -Stop diltiazem gtt, start amiodarone gtt in the setting of severe acute comorbid illness (without bolus due to relative hypotension) -High risk for recurrent sustained atrial arrhythmia  -Heparin drip for now with recommendation to resume PTA apixaban prior to discharge -CHA2DS2-VASc at least 6, possibly 7 pending A1c (CHF, HTN, age x 1, CVA x 2, vascular disease)  5.  PAD/carotid artery disease: -No flow in BLE with Doppler this morning per vascular surgery -Management per vascular surgery  6.  HTN: -Blood pressure currently well-controlled -Medical therapy as outlined above   7.  HLD: -LDL 52 in 05/2022 -Update lipid panel -Currently on atorvastatin 40 mg  8.  Polysubstance abuse: -Urine drug screen positive for cocaine -Prior history of alcohol and tobacco use -Complete cessation is recommended  9.  Prediabetes: -Obtain A1c for further risk stratification   -Agree with palliative care consult given severe acute illness with significant comorbities.       For questions or updates, please contact [facility] Please consult [email or web address] for contact info under Cardiology/STEMI.   Signed, Eula Listen, PA-C [facility] Pager: [phone] 05/16/2023, 11:03 AM

## 2023-05-16 NOTE — Progress Notes (Addendum)
[facility] [place] PROGRESS NOTE  ASSESSMENT / PLAN: Timothy Galvan is a 67 y.o. male status post right axilo-femoral bypass and thrombectomy of right-to-left femoral-femoral bypass 06/07/2023.  Patient continues to do poorly. Overnight required chemical and electrical cardioversion. Remains encephalopathic without meaningful interaction. Doppler flow in popliteal arteries only. Fem-fem bypass appears to have rethrombosed on duplex despite heparin. Ax-fem is patent.  Globally very poor prognosis. Recommend evaluation by palliative care. Comfort measures only would be appropriate in this case. Return to operating room not likely to help patient, and could trigger fatal arhythmia. Will discuss with family.  SUBJECTIVE: Remains encephalopathic. Withdraws to painful stimuli. Needed chemical and electrical cardioversion yesterday evening.   OBJECTIVE: BP 128/83   Pulse (!) 102   Temp 99.1 F (37.3 C) (Axillary)   Resp (!) 25   Ht 5\' 3"  (1.6 m)   Wt 62.8 kg   SpO2 96%   BMI 24.53 kg/m   Intake/Output Summary (Last 24 hours) at 05/16/2023 1042 Last data filed at 05/16/2023 0700 Gross per 24 hour  Intake 1707.84 ml  Output 1625 ml  Net 82.84 ml    Encephalopathic. Opens eyes spontaneously. Moves extremities spontaneously. Tachycardic Tachypneic Right axillary incision soft without hematoma Pulse in right axillary - femoral bypass Right groin incision soft without hematoma No pulse in fem-fem bypass. No color doppler flow on duplex. Left groin incision soft without hematoma No doppler flow in BLE Right calf fasciotomy VAC with good seal     Latest Ref Rng & Units 05/16/2023    5:47 AM 05/25/2023   12:01 AM 11/29/2022   12:52 PM  CBC  WBC 4.0 - 10.5 K/uL 14.9  9.7  6.3   Hemoglobin 13.0 - 17.0 g/dL 16.1  09.6  04.5   Hematocrit 39.0 - 52.0 % 46.6  48.2  44.4   Platelets 150 - 400 K/uL 166  183  225         Latest Ref Rng & Units 05/16/2023    8:21 AM  06/04/2023   12:01 AM 11/29/2022   12:52 PM  CMP  Glucose 70 - 99 mg/dL 409  811  914   BUN 8 - 23 mg/dL 23  20  28    Creatinine 0.61 - 1.24 mg/dL 7.82  9.56  2.13   Sodium 135 - 145 mmol/L 136  138  140   Potassium 3.5 - 5.1 mmol/L 4.6  4.4  4.0   Chloride 98 - 111 mmol/L 105  106  112   CO2 22 - 32 mmol/L 20  22  20    Calcium 8.9 - 10.3 mg/dL 9.0  08.6  8.9     Estimated Creatinine Clearance: 54 mL/min (by C-G formula based on SCr of 1.04 mg/dL).  Rande Brunt. Lenell Antu, MD [facility] Phone Number: [phone] 05/16/2023 10:42 AM

## 2023-05-16 NOTE — Plan of Care (Signed)
  Problem: Education: Goal: Knowledge of General Education information will improve Description: Including pain rating scale, medication(s)/side effects and non-pharmacologic comfort measures Outcome: Progressing   Problem: Clinical Measurements: Goal: Ability to maintain clinical measurements within normal limits will improve Outcome: Not Progressing Goal: Will remain free from infection Outcome: Progressing Goal: Diagnostic test results will improve Outcome: Progressing Goal: Respiratory complications will improve Outcome: Progressing Goal: Cardiovascular complication will be avoided Outcome: Not Progressing

## 2023-05-16 NOTE — Procedures (Signed)
DIRECT CURRENT CARDIOVERSION   Timothy Galvan  [id number]  04/12/1956   Date:05/16/23  Time:8:01 AM   Provider Performing:Chancy Smigiel   INDICATIONS: SVT.  PROCEDURE:  Informed consent was obtained prior to the procedure. The risks, benefits and alternatives for the procedure were discussed and the patient comprehended these risks. Once an appropriate time out was taken, the patient had the defibrillator pads placed in the anterior and posterior position. The patient then underwent sedation by the anesthesia service. Once an appropriate level of sedation was achieved, the patient received a single biphasic, synchronized 200J shock with prompt conversion to sinus rhythm. No apparent complications. Three attempts made, failed to cardiovert the patient.  Raechel Chute, MD [facility] 05/16/2023 8:02 AM

## 2023-05-16 NOTE — TOC Initial Note (Signed)
Transition of Care [facility]) - Initial/Assessment Note    Patient Details  Name: Timothy Galvan MRN: [id number] Date of Birth: [birth year]  Transition of Care [facility]) CM/SW Contact:    Colette Ribas, LCSWA Phone Number: 05/16/2023, 9:07 AM  Clinical Narrative:                 CSW received consult for SA resources. SA resources added to AVS.        Patient Goals and CMS Choice            Expected Discharge Plan and Services                                              Prior Living Arrangements/Services                       Activities of Daily Living      Permission Sought/Granted                  Emotional Assessment              Admission diagnosis:  Critical limb ischemia of right lower extremity (HCC) [I70.221] Acute lower limb ischemia [I99.8] Ischemic leg [I99.8] Patient Active Problem List   Diagnosis Date Noted   History of CVA (cerebrovascular accident) 05/22/2023   Critical ischemia of extremity with history of revascularization of same extremity (HCC) 06/05/2023   Tobacco use disorder 06/02/2023   Alcohol use disorder 06/02/2023   Ischemic leg 06/05/2023   Avascular necrosis of bone of hip (HCC)    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   Insomnia 02/19/2023   HTN (hypertension) 06/15/2022   Left pontine stroke (HCC) 06/05/2022   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) 06/05/2022   Stroke (cerebrum) (HCC) 06/01/2022   PAD (peripheral artery disease) (HCC) 06/01/2022   Colon cancer (HCC) 06/01/2022   COPD (chronic obstructive pulmonary disease) (HCC) 06/01/2022   Blood clot in vein 03/08/2022   Cancer (HCC) 03/08/2022   Chest pain 01/16/2019   GERD (gastroesophageal reflux disease) 01/10/2018   CAD (coronary artery disease) 01/10/2018   Closed compression fracture of body of L1 vertebra (HCC) 01/10/2018   HLD (hyperlipidemia) 01/10/2018   PCP:  Inc, [employer]  Services Pharmacy:   [facility] - [place], [place] - [address] [place] [address] Phone: [phone] Fax: [phone]  [facility] - [place], [place] - [address] [place] [address] Phone: [phone] Fax: [phone]     Social Determinants of Health (SDOH) Social History: SDOH Screenings   Food Insecurity: No Food Insecurity (06/04/2022)  Housing: Low Risk  (06/04/2022)  Transportation Needs: No Transportation Needs (05/29/2021)   Received from [facility], [facility]  Depression [phone]): Low Risk  (11/27/2022)  Financial Resource Strain: Low Risk  (05/29/2021)   Received from [facility], [facility]  Tobacco Use: High Risk (06/05/2023)   SDOH Interventions:     Readmission Risk Interventions    06/02/2022   12:55 PM  Readmission Risk Prevention Plan  Medication Screening Complete  Transportation Screening Complete

## 2023-05-16 NOTE — Progress Notes (Signed)
Progress Note   Patient: Timothy Galvan:[id number] DOB: 09-Jul-1956 DOA: 05/27/2023     1 DOS: the patient was seen and examined on 05/16/2023   Brief hospital course: Timothy Galvan is a 67 y.o. male with medical history significant for Asthma, CAD, GERD, HTN, tobacco, EtOH and cocaine use, colon cancer, PAF on eliquis, severe PAD with multiple revascularization, stroke 04/2022, who is being admitted with critical limb ischemia.  Vascular team evaluated him took him to OR. Vascular procedure 05/26/2023: 1) right axilary - right common femoral arterial bypass (6mm ePTFE) 2) thrombectomy right-to-left femoral-femoral bypass 3) right calf four compartment fasciotomies.  He is transferred to ICU for close monitoring. Very confused last night, HR elevated.  11/3: Patient tachycardic 180, got Adenosine 6mg , 12 mg without help. DCCV performed by CCM did not help. Got Cardizem which did control rate.  Assessment and Plan: * Critical ischemia of extremity with history of revascularization of same extremity  Extensive PAD Continue heparin infusion as per pharmacy protocol. Vascular Dr. Lenell Antu performed right axilary - right common femoral arterial bypass, thrombectomy right-to-left femoral-femoral bypass and  right calf four compartment fasciotomies. Continue pain control. NPO for now as he is confused. Discussed wit hvascular team, he is at high risk for further interventions. Will need to discuss with family regarding poor prognosis and further management. Will involve palliative team  Acute metabolic and toxic encephalopathy. Alcohol withdrawal. UDS positive for cocaine. Continue CIWA withdrawal protocol He is sleepy, got versed, fentanyl for DCCV. Continue to monitor neurochecks.  SVT- sustained Paroxysmal Afib Post vascular intervention. Adenosine 6mg , 12 mg without help.  DCCV performed by CCM did not help.  He is started on Cardizem drip. Eliquis on hold as he is on heparin  drip.  Tobacco use disorder Nicotine patch  Hypertension. CAD (coronary artery disease) Chronic systolic and diastolic CHF Home meds- losartan, isosorbide, aspirin and atorvastatin nitroglycerin. Will continue to monitor BP, plan to start GDMT as tolerated. Hydralazine IV for very high BP. Avoid betablocker as he is cocaine user. Continue aspirin and statin once more awake.  COPD (chronic obstructive pulmonary disease) (HCC) No acute exacerbated. He is on room air. Continue home inhalers with DuoNebs as needed  H/o CVA: Aspirin and statin once awake.    Nursing supportive care. Fall, aspiration precautions. DVT prophylaxis   Code Status: Full Code  Subjective: Patient is seen and examined today morning. He is restless, tele showed SVT. Critical care staff at bedside. Got adenosine 2 doses, HR elevated.  Physical Exam: Vitals:   05/16/23 0735 05/16/23 0745 05/16/23 0750 05/16/23 0753  BP: (!) 131/108 128/79 116/83 128/82  Pulse:   (!) 33 (!) 152  Resp: 19 (!) 21 (!) 27 17  Temp:      TempSrc:      SpO2:    91%  Weight:      Height:        General - Elderly African American male, restless, agitated. HEENT - PERRLA, EOMI, atraumatic head, non tender sinuses. Lung - Clear, basal rales, rhonchi, no wheezes. Heart - S1, S2 heard, no murmurs, rubs, no pedal edema. Abdomen - Soft, non tender, bowel sounds good Neuro - restless, agitated, unable to do neuro exam. Skin - Warm and dry.  Data Reviewed:      Latest Ref Rng & Units 05/16/2023    5:47 AM 06/06/2023   12:01 AM 11/29/2022   12:52 PM  CBC  WBC 4.0 - 10.5 K/uL 14.9  9.7  6.3   Hemoglobin 13.0 - 17.0 g/dL 95.1  88.4  16.6   Hematocrit 39.0 - 52.0 % 46.6  48.2  44.4   Platelets 150 - 400 K/uL 166  183  225       Latest Ref Rng & Units 05/16/2023    8:21 AM 06/09/2023   12:01 AM 11/29/2022   12:52 PM  BMP  Glucose 70 - 99 mg/dL 063  016  010   BUN 8 - 23 mg/dL 23  20  28    Creatinine 0.61 - 1.24 mg/dL 9.32   3.55  7.32   Sodium 135 - 145 mmol/L 136  138  140   Potassium 3.5 - 5.1 mmol/L 4.6  4.4  4.0   Chloride 98 - 111 mmol/L 105  106  112   CO2 22 - 32 mmol/L 20  22  20    Calcium 8.9 - 10.3 mg/dL 9.0  20.2  8.9    CT HEAD WO CONTRAST ( )  Result Date: 05/26/2023 CLINICAL DATA:  Mental status change with unknown cause. EXAM: CT HEAD WITHOUT CONTRAST TECHNIQUE: Contiguous axial images were obtained from the base of the skull through the vertex without intravenous contrast. RADIATION DOSE REDUCTION: This exam was performed according to the departmental dose-optimization program which includes automated exposure control, adjustment of the mA and/or kV according to patient size and/or use of iterative reconstruction technique. COMPARISON:  11/29/2022 FINDINGS: Brain: No evidence of acute infarction, hemorrhage, hydrocephalus, extra-axial collection or mass lesion/mass effect. Chronic small vessel ischemia in the cerebral white matter with chronic perforator infarct at the left basal ganglia and posterior right frontal cortex. Brain atrophy that is generalized. Cerebellar atrophy. Vascular: No focal hyperdense vessel or unexpected calcification. Recent enhanced scan. Skull: Normal. Negative for fracture or focal lesion. Sinuses/Orbits: No acute finding. IMPRESSION: 1. No emergent finding . 2. Chronic perforator and right frontal cortex infarcts. Electronically Signed   By: Tiburcio Pea M.D.   On: 05/31/2023 09:00   CT Angio Aortobifemoral W and/or Wo Contrast  Result Date: 05/17/2023 CLINICAL DATA:  Claudication or leg ischemia. Right leg paresthesia, coolness. EXAM: CT ANGIOGRAPHY OF ABDOMINAL AORTA WITH ILIOFEMORAL RUNOFF TECHNIQUE: Multidetector CT imaging of the abdomen, pelvis and lower extremities was performed using the standard protocol during bolus administration of intravenous contrast. Multiplanar CT image reconstructions and MIPs were obtained to evaluate the vascular anatomy. RADIATION DOSE  REDUCTION: This exam was performed according to the departmental dose-optimization program which includes automated exposure control, adjustment of the mA and/or kV according to patient size and/or use of iterative reconstruction technique. CONTRAST:  OMNIPAQUE IOHEXOL 350 MG/ML SOLN COMPARISON:  None Available. FINDINGS: VASCULAR Aorta: There is thrombosis of the infrarenal abdominal aorta. The suprarenal and juxtarenal abdominal aorta are of normal caliber. No aneurysm or dissection. Celiac: Patent without evidence of aneurysm, dissection, vasculitis or significant stenosis. SMA: Patent without evidence of aneurysm, dissection, vasculitis or significant stenosis. Renals: Both renal arteries are patent without evidence of aneurysm, dissection, vasculitis, fibromuscular dysplasia or significant stenosis. IMA: Thrombosed at its origin. Reconstituted by the marginal artery. Distally widely patent. RIGHT Lower Extremity Inflow: The stented right common iliac and external iliac arteries are thrombosed. The right internal iliac artery is heavily calcified and chronically thrombosed with reconstitution of the peripheral arterial branches of the internal iliac artery distribution via iliolumbar, rectal arcade and external-internal pudendal collateralization. Outflow: Femoral-femoral bypass graft is thrombosed. Reconstitution of the right common femoral artery via circumflex iliac and inferior epigastric arteries with  high-grade stenoses at the point of collateralization. 50% stenosis of the profundus femoral artery at its origin. Distally, this vessel is widely patent. The superficial femoral artery is proximally thrombosed. Runoff: Reconstitution of the popliteal artery via geniculate collaterals the popliteal artery is diminutive, likely related to poor inflow. Scattered calcification involving the terminal popliteal artery and proximal runoff vasculature noted with thrombosis of the anterior tibial and peroneal  arteries proximally. The posterior tibial artery appears thrombosed at its origin but quickly reconstitutes. Distal non opacification likely related to suboptimal bolus timing due to poor inflow. LEFT Lower Extremity Inflow: Chronically thrombosed left common and external iliac arteries. The left internal iliac artery is chronically thrombosed. Collateralization of the distal internal iliac arterial distribution via ileo lumbar, rectal arcade, and internal-external pudendal collaterals. Outflow: Reconstitution of the left common femoral artery via circumflex iliac and inferior epigastric collaterals. High-grade stenosis at the confluence of the inferior epigastric artery and common femoral artery. Fem-fem bypass graft is thrombosed. Profundus femoral and superficial femoral arteries are widely patent. Runoff: There is 50% focal stenosis of the P2 segment of the popliteal artery. There is three-vessel runoff to the left ankle with patency of the plantar arch. The dorsalis pedis artery is not clearly patent. Veins: No obvious venous abnormality within the limitations of this arterial phase study. Review of the MIP images confirms the above findings. NON-VASCULAR Lower chest: No acute abnormality. Hepatobiliary: No focal liver abnormality is seen. No gallstones, gallbladder wall thickening, or biliary dilatation. Pancreas: Unremarkable Spleen: Unremarkable Adrenals/Urinary Tract: Adrenal glands are unremarkable. Kidneys are normal, without renal calculi, focal lesion, or hydronephrosis. Bladder is unremarkable. Stomach/Bowel: Stomach is within normal limits. Appendix appears normal. No evidence of bowel wall thickening, distention, or inflammatory changes. Lymphatic: No pathologic adenopathy within the abdomen and pelvis. Reproductive: Mild prostatic hypertrophy Other: None significant Musculoskeletal: Avascular necrosis of the femoral heads bilaterally without articular collapse. Remote superior endplate fracture of  L1. Acute to subacute superior endplate fracture of T12 with mild loss of height and no retropulsion. IMPRESSION: VASCULAR Thrombosis of the infrarenal abdominal aorta and lower extremity inflow bilaterally with reconstitution of lower extremity arterial outflow via posterior collaterals. Superimposed high-grade stenoses at the juncture of the collateral vessels and common femoral arteries bilaterally. Thrombosis of a fem-fem bypass graft. Thrombosis of the right superficial femoral artery. Reconstitution of the right popliteal artery via geniculate collaterals. Thrombosis of the a right anterior tibial and peroneal arteries proximally. Patency of the right posterior tibial artery to the level of the distal diaphysis of the tibia with poor inflow and limited opacification precluding evaluation of distal patency. Widely patent left lower extremity arterial outflow and runoff. Thrombosis of the left dorsalis pedis artery. 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 Acute to subacute T12 superior endplate fracture.  No retropulsion. Electronically Signed   By: Helyn Numbers M.D.   On: 06/03/2023 02:14     Family Communication: Discussed with daughter over phone regarding morning events. Daughter will be here soon. All questions answereed.  Disposition: Status is: Inpatient Remains inpatient appropriate because: critically ill post vascular procedure.  Planned Discharge Destination: Home with Home Health and Skilled nursing facility     MDM level 3- Patient is post op vascular procedure, mental status poor, tele with SVT with low BP this morning. He is critically ill and will need close hemodynamic, telemetry monitoring. Overall prognosis poor.  Author: Marcelino Duster, MD 05/16/2023 9:13 AM Secure chat 7am to 7pm For on call  review [email or web address].

## 2023-05-16 NOTE — Progress Notes (Signed)
Dr. Juliann Pares discussing patient with Lanora Manis, NP over phone and gave order for 5 mg IV verapamil Stat.

## 2023-05-16 NOTE — Progress Notes (Signed)
ANTICOAGULATION CONSULT NOTE  Pharmacy Consult for heparin infusion Indication: Ischemic right lower leg  Allergies  Allergen Reactions   Penicillins     Has patient had a PCN reaction causing immediate rash, facial/tongue/throat swelling, SOB or lightheadedness with hypotension: Unknown Has patient had a PCN reaction causing severe rash involving mucus membranes or skin necrosis: Unknown Has patient had a PCN reaction that required hospitalization: Unknown Has patient had a PCN reaction occurring within the last 10 years: Unknown If all of the above answers are "NO", then may proceed with Cephalosporin use.    Patient Measurements: Height: 5\' 3"  (160 cm) Weight: 62.8 kg (138 lb 7.2 oz) IBW/kg (Calculated) : 56.9 Heparin Dosing Weight: 62.6 kg  Vital Signs: Temp: 99.1 F (37.3 C) (11/03 0500) Temp Source: Axillary (11/03 0500) BP: 128/83 (11/03 1000) Pulse Rate: 102 (11/03 1015)  Labs: Recent Labs    06/06/2023 0001 06/04/2023 0830 05/16/23 0547 05/16/23 0821 05/16/23 1211  HGB 15.9  --  15.1  --   --   HCT 48.2  --  46.6  --   --   PLT 183  --  166  --   --   APTT 29 147*  --   --   --   LABPROT 14.2  --   --   --   --   INR 1.1  --   --   --   --   HEPARINUNFRC 0.10* >1.10* 0.46  --  0.41  CREATININE 1.14  --   --  1.04  --   CKTOTAL  --   --   --  10,993*  --    Estimated Creatinine Clearance: 54 mL/min (by C-G formula based on SCr of 1.04 mg/dL).  Medical History: Past Medical History:  Diagnosis Date   Asthma    Avascular necrosis of bone of hip (HCC)    Blood clot in vein    Cancer (HCC)    Carotid stenosis, bilateral    Cerebrovascular accident (CVA), unspecified mechanism (HCC)    Coronary artery disease    GERD (gastroesophageal reflux disease)    Heart disease    Hypertension    PAD (peripheral artery disease) (HCC)    Pneumonia    Medications:  PTA Meds: Eliquis 5 mg BID, Baseline HL 0.10  Assessment: Pt is a 67 yo male presenting to ED  reporting R leg & foot pain, which began to feel numb and cold.  Goal of Therapy:  Heparin level 0.3-0.7 units/ml aPTT 66-102 seconds Monitor platelets by anticoagulation protocol: Yes   11/03 0547 HL 0.46, therapeutic x 1 11/03 1211 HL 0.41, therapeutic x 2   Plan:  Continue heparin infusion at 950 units/hr Recheck HL with AM labs  CBC daily while on heparin  Littie Deeds, PharmD Pharmacy Resident  05/16/2023 12:40 PM

## 2023-05-16 NOTE — Progress Notes (Signed)
Patients HR sustaining into the 150's. PRN Metoprolol 5 mg IV given per order. Will continue to monitor.

## 2023-05-17 ENCOUNTER — Encounter: Payer: Self-pay | Admitting: Vascular Surgery

## 2023-05-17 DIAGNOSIS — I739 Peripheral vascular disease, unspecified: Secondary | ICD-10-CM | POA: Diagnosis not present

## 2023-05-17 DIAGNOSIS — I480 Paroxysmal atrial fibrillation: Secondary | ICD-10-CM | POA: Diagnosis not present

## 2023-05-17 DIAGNOSIS — E872 Acidosis, unspecified: Secondary | ICD-10-CM

## 2023-05-17 DIAGNOSIS — I998 Other disorder of circulatory system: Secondary | ICD-10-CM | POA: Diagnosis not present

## 2023-05-17 DIAGNOSIS — Z515 Encounter for palliative care: Secondary | ICD-10-CM | POA: Diagnosis not present

## 2023-05-17 DIAGNOSIS — F1090 Alcohol use, unspecified, uncomplicated: Secondary | ICD-10-CM | POA: Diagnosis not present

## 2023-05-17 DIAGNOSIS — N179 Acute kidney failure, unspecified: Secondary | ICD-10-CM

## 2023-05-17 DIAGNOSIS — I2510 Atherosclerotic heart disease of native coronary artery without angina pectoris: Secondary | ICD-10-CM | POA: Diagnosis not present

## 2023-05-17 DIAGNOSIS — Z8673 Personal history of transient ischemic attack (TIA), and cerebral infarction without residual deficits: Secondary | ICD-10-CM | POA: Diagnosis not present

## 2023-05-17 DIAGNOSIS — I70229 Atherosclerosis of native arteries of extremities with rest pain, unspecified extremity: Secondary | ICD-10-CM | POA: Diagnosis not present

## 2023-05-17 LAB — CBC
HCT: 38.7 % — ABNORMAL LOW (ref 39.0–52.0)
Hemoglobin: 12.9 g/dL — ABNORMAL LOW (ref 13.0–17.0)
MCH: 29.8 pg (ref 26.0–34.0)
MCHC: 33.3 g/dL (ref 30.0–36.0)
MCV: 89.4 fL (ref 80.0–100.0)
Platelets: 142 10*3/uL — ABNORMAL LOW (ref 150–400)
RBC: 4.33 MIL/uL (ref 4.22–5.81)
RDW: 14.1 % (ref 11.5–15.5)
WBC: 7.9 10*3/uL (ref 4.0–10.5)
nRBC: 0 % (ref 0.0–0.2)

## 2023-05-17 LAB — GLUCOSE, CAPILLARY
Glucose-Capillary: 35 mg/dL — CL (ref 70–99)
Glucose-Capillary: 58 mg/dL — ABNORMAL LOW (ref 70–99)
Glucose-Capillary: 115 mg/dL — ABNORMAL HIGH (ref 70–99)
Glucose-Capillary: 36 mg/dL — CL (ref 70–99)
Glucose-Capillary: 71 mg/dL (ref 70–99)
Glucose-Capillary: 35 mg/dL — CL (ref 70–99)
Glucose-Capillary: 78 mg/dL (ref 70–99)
Glucose-Capillary: 69 mg/dL — ABNORMAL LOW (ref 70–99)
Glucose-Capillary: 86 mg/dL (ref 70–99)

## 2023-05-17 LAB — HEPARIN LEVEL (UNFRACTIONATED)
Heparin Unfractionated: 0.28 [IU]/mL — ABNORMAL LOW (ref 0.30–0.70)
Heparin Unfractionated: 0.41 [IU]/mL (ref 0.30–0.70)
Heparin Unfractionated: 0.17 [IU]/mL — ABNORMAL LOW (ref 0.30–0.70)

## 2023-05-17 LAB — TROPONIN I (HIGH SENSITIVITY): Troponin I (High Sensitivity): 2907 ng/L (ref <18)

## 2023-05-17 LAB — BASIC METABOLIC PANEL
Anion gap: 11 (ref 5–15)
BUN: 41 mg/dL — ABNORMAL HIGH (ref 8–23)
CO2: 22 mmol/L (ref 22–32)
Calcium: 8.2 mg/dL — ABNORMAL LOW (ref 8.9–10.3)
Chloride: 104 mmol/L (ref 98–111)
Creatinine, Ser: 1.64 mg/dL — ABNORMAL HIGH (ref 0.61–1.24)
GFR, Estimated: 45 mL/min — ABNORMAL LOW (ref >60)
Glucose, Bld: 147 mg/dL — ABNORMAL HIGH (ref 70–99)
Potassium: 4.9 mmol/L (ref 3.5–5.1)
Sodium: 137 mmol/L (ref 135–145)

## 2023-05-17 LAB — MAGNESIUM: Magnesium: 2.4 mg/dL (ref 1.7–2.4)

## 2023-05-17 LAB — LACTIC ACID, PLASMA: Lactic Acid, Venous: 4.6 mmol/L (ref 0.5–1.9)

## 2023-05-17 LAB — GLUCOSE, RANDOM: Glucose, Bld: 120 mg/dL — ABNORMAL HIGH (ref 70–99)

## 2023-05-17 MED ORDER — DEXTROSE 50 % IV SOLN
12.5000 g | INTRAVENOUS | Status: AC
  Administered 2023-05-17: 12.5 g via INTRAVENOUS
  Filled 2023-05-17: qty 50

## 2023-05-17 MED ORDER — FOLIC ACID 5 MG/ML IJ SOLN
1.0000 mg | Freq: Every day | INTRAMUSCULAR | Status: DC
  Administered 2023-05-18: 1 mg via INTRAVENOUS
  Filled 2023-05-17 (×2): qty 0.2

## 2023-05-17 MED ORDER — LACTATED RINGERS IV BOLUS
500.0000 mL | Freq: Once | INTRAVENOUS | Status: AC
  Administered 2023-05-17: 500 mL via INTRAVENOUS

## 2023-05-17 MED ORDER — DEXTROSE 10 % IV SOLN: INTRAVENOUS | Status: DC

## 2023-05-17 MED ORDER — HEPARIN BOLUS VIA INFUSION
1900.0000 [IU] | Freq: Once | INTRAVENOUS | Status: AC
  Administered 2023-05-17: 1900 [IU] via INTRAVENOUS
  Filled 2023-05-17: qty 1900

## 2023-05-17 MED ORDER — LACTATED RINGERS IV BOLUS: 500.0000 mL | Freq: Once | INTRAVENOUS | Status: DC

## 2023-05-17 MED ORDER — DEXTROSE 5 % IV SOLN: INTRAVENOUS | Status: DC

## 2023-05-17 MED ORDER — HEPARIN BOLUS VIA INFUSION
900.0000 [IU] | Freq: Once | INTRAVENOUS | Status: AC
  Administered 2023-05-17: 900 [IU] via INTRAVENOUS
  Filled 2023-05-17: qty 900

## 2023-05-17 NOTE — Progress Notes (Signed)
Progress Note    05/17/2023 4:06 PM 2 Days Post-Op  Subjective:  Timothy Galvan is a 67 y.o. male status post right axilo-femoral bypass and thrombectomy of right-to-left femoral-femoral bypass 05/17/2023.   Patient continues to do poorly. Overnight required chemical and electrical cardioversion. Remains encephalopathic without meaningful interaction. Doppler flow in popliteal arteries only. Fem-fem bypass appears to have rethrombosed on duplex despite heparin. Ax-fem is patent.   Globally very poor prognosis. Recommend evaluation by palliative care. Comfort measures only would be appropriate in this case. Return to operating room not likely to help patient, and could trigger fatal arhythmia.   Vitals:   05/17/23 1400 05/17/23 1500  BP: (!) 95/56 (!) 92/59  Pulse:    Resp: (!) 24 18  Temp:    SpO2:     Physical Exam: Cardiac:  Irregular Tachycardia PVC noted  Lungs:  Course lung sounds on auscultation  Incisions:  Right axillary, right groin and right calf fasciotomies remain with dressings clean dry and intact. Wound vac placed to right calf fasciotomies, working well.  Extremities:  Unable to doppler any pulses in left lower extremity which is now cold to touch. Right lower extremity unable to doppler popliteal, tibial, peroneal or DP pulses. Knee down patients leg is cold to touch.  Abdomen:  Positive bowel sounds, soft, non tender and non distended.  Neurologic: Encephalopathic. Opens eyes spontaneously. Moves extremities spontaneously.   CBC    Component Value Date/Time   WBC 7.9 05/17/2023 0400   RBC 4.33 05/17/2023 0400   HGB 12.9 (L) 05/17/2023 0400   HGB 13.6 02/06/2014 0416   HCT 38.7 (L) 05/17/2023 0400   HCT 41.8 02/06/2014 0416   PLT 142 (L) 05/17/2023 0400   PLT 215 02/06/2014 0416   MCV 89.4 05/17/2023 0400   MCV 94 02/06/2014 0416   MCH 29.8 05/17/2023 0400   MCHC 33.3 05/17/2023 0400   RDW 14.1 05/17/2023 0400   RDW 13.9 02/06/2014 0416   LYMPHSABS 3.1  06/05/2023 0001   LYMPHSABS 3.9 (H) 02/06/2014 0416   MONOABS 0.7 05/31/2023 0001   MONOABS 0.7 02/06/2014 0416   EOSABS 0.0 06/01/2023 0001   EOSABS 0.8 (H) 02/06/2014 0416   BASOSABS 0.0 05/17/2023 0001   BASOSABS 0.1 02/06/2014 0416    BMET    Component Value Date/Time   NA 137 05/17/2023 0400   NA 140 02/06/2014 0416   K 4.9 05/17/2023 0400   K 3.8 02/06/2014 0416   CL 104 05/17/2023 0400   CL 105 02/06/2014 0416   CO2 22 05/17/2023 0400   CO2 25 02/06/2014 0416   GLUCOSE 120 (H) 05/17/2023 1201   GLUCOSE 99 02/06/2014 0416   BUN 41 (H) 05/17/2023 0400   BUN 16 02/06/2014 0416   CREATININE 1.64 (H) 05/17/2023 0400   CREATININE 1.19 02/06/2014 0416   CALCIUM 8.2 (L) 05/17/2023 0400   CALCIUM 8.8 02/06/2014 0416   GFRNONAA 45 (L) 05/17/2023 0400   GFRNONAA >60 02/06/2014 0416   GFRAA >60 02/04/2019 0944   GFRAA >60 02/06/2014 0416    INR    Component Value Date/Time   INR 1.1 05/31/2023 0001   INR 1.0 02/05/2014 1612     Intake/Output Summary (Last 24 hours) at 05/17/2023 1606 Last data filed at 05/17/2023 0700 Gross per 24 hour  Intake 2920.34 ml  Output 925 ml  Net 1995.34 ml     Assessment/Plan:  67 y.o. male is s/p right axilo-femoral bypass and thrombectomy of right-to-left femoral-femoral bypass 06/09/2023  2 Days Post-Op   Plan: Unable to Doppler pulses on the right at popliteal or lower and unable to Doppler pulses in patient's left lower extremity from femoral area throughout his entire leg.  Both lower extremities are very cold to the touch.  They appear to be extensively ischemic at this point.  Globally the patient has very poor prognosis.  Recommendation remains the same which would be evaluation by palliative care for comfort measures in this case.  Return to the operating room not likely to help the patient could trigger fatal arrhythmias and other complications with possible death.  DVT prophylaxis: Heparin infusion  I discussed the case in  detail with Dr. Levora Dredge MD and he agrees with the plan.   Marcie [facility] 05/17/2023 4:06 PM

## 2023-05-17 NOTE — TOC Progression Note (Signed)
Transition of Care [facility]) - Progression Note    Patient Details  Name: Timothy Galvan MRN: [id number] Date of Birth: 1955/11/10  Transition [facility]) CM/SW Contact  Truddie Hidden, RN Phone Number: 05/17/2023, 4:22 PM  Clinical Narrative:    TOC continuing to follow patient's progress throughout discharge planning.        Expected Discharge Plan and Services                                               Social Determinants of Health (SDOH) Interventions SDOH Screenings   Food Insecurity: No Food Insecurity (06/04/2022)  Housing: Low Risk  (06/04/2022)  Transportation Needs: No Transportation Needs (05/29/2021)   Received from [facility], [facility]  Depression [phone]): Low Risk  (11/27/2022)  Financial Resource Strain: Low Risk  (05/29/2021)   Received from [facility], [facility]  Tobacco Use: High Risk (05/16/2023)    Readmission Risk Interventions    06/02/2022   12:55 PM  Readmission Risk Prevention Plan  Medication Screening Complete  Transportation Screening Complete

## 2023-05-17 NOTE — Plan of Care (Signed)
  Problem: Education: Goal: Knowledge of General Education information will improve Description: Including pain rating scale, medication(s)/side effects and non-pharmacologic comfort measures Outcome: Not Progressing   Problem: Health Behavior/Discharge Planning: Goal: Ability to manage health-related needs will improve Outcome: Not Progressing   Problem: Clinical Measurements: Goal: Ability to maintain clinical measurements within normal limits will improve Outcome: Not Progressing Goal: Will remain free from infection Outcome: Not Progressing Goal: Diagnostic test results will improve Outcome: Not Progressing Goal: Respiratory complications will improve Outcome: Progressing Goal: Cardiovascular complication will be avoided Outcome: Not Progressing   Problem: Activity: Goal: Risk for activity intolerance will decrease Outcome: Not Progressing   Problem: Nutrition: Goal: Adequate nutrition will be maintained Outcome: Not Progressing

## 2023-05-17 NOTE — Progress Notes (Signed)
ANTICOAGULATION CONSULT NOTE  Pharmacy Consult for heparin infusion Indication: Ischemic right lower leg  Allergies  Allergen Reactions   Penicillins     Has patient had a PCN reaction causing immediate rash, facial/tongue/throat swelling, SOB or lightheadedness with hypotension: Unknown Has patient had a PCN reaction causing severe rash involving mucus membranes or skin necrosis: Unknown Has patient had a PCN reaction that required hospitalization: Unknown Has patient had a PCN reaction occurring within the last 10 years: Unknown If all of the above answers are "NO", then may proceed with Cephalosporin use.    Patient Measurements: Height: 5\' 3"  (160 cm) Weight: 62.8 kg (138 lb 7.2 oz) IBW/kg (Calculated) : 56.9 Heparin Dosing Weight: 62.6 kg  Vital Signs: Temp: 99.7 F (37.6 C) (11/04 0416) Temp Source: Axillary (11/04 0416) BP: 106/64 (11/04 0416) Pulse Rate: 80 (11/04 0416)  Labs: Recent Labs    05/16/2023 0001 06/06/2023 0830 05/16/23 0547 05/16/23 0821 05/16/23 1211 05/17/23 0400  HGB 15.9  --  15.1  --   --  12.9*  HCT 48.2  --  46.6  --   --  38.7*  PLT 183  --  166  --   --  142*  APTT 29 147*  --   --   --   --   LABPROT 14.2  --   --   --   --   --   INR 1.1  --   --   --   --   --   HEPARINUNFRC 0.10* >1.10* 0.46  --  0.41 0.28*  CREATININE 1.14  --   --  1.04  --  1.64*  CKTOTAL  --   --   --  10,993*  --   --    Estimated Creatinine Clearance: 34.2 mL/min (A) (by C-G formula based on SCr of 1.64 mg/dL (H)).  Medical History: Past Medical History:  Diagnosis Date   Asthma    Avascular necrosis of bone of hip (HCC)    Blood clot in vein    Cancer (HCC)    Carotid stenosis, bilateral    Cerebrovascular accident (CVA), unspecified mechanism (HCC)    Coronary artery disease    GERD (gastroesophageal reflux disease)    Heart disease    Hypertension    PAD (peripheral artery disease) (HCC)    Pneumonia    Medications:  PTA Meds: Eliquis 5 mg BID,  Baseline HL 0.10  Assessment: Pt is a 67 yo male presenting to ED reporting R leg & foot pain, which began to feel numb and cold.  Goal of Therapy:  Heparin level 0.3-0.7 units/ml aPTT 66-102 seconds Monitor platelets by anticoagulation protocol: Yes   11/03 0547 HL 0.46, therapeutic x 1 11/03 1211 HL 0.41, therapeutic x 2 11/04 0400 HL 0.28, subtherapeutic  Plan:  Bolus 900 units x 1 Increase heparin infusion to 1100 units/hr Recheck HL with AM labs  CBC daily while on heparin  Otelia Sergeant, PharmD, [facility] 05/17/2023 5:14 AM

## 2023-05-17 NOTE — IPAL (Addendum)
  Interdisciplinary Goals of Care Family Meeting   Date carried out: 05/17/2023  Location of the meeting: Phone conference  Member's involved: Physician, Bedside Registered Nurse, and Family Member or next of kin    GOALS OF CARE DISCUSSION  The Clinical status was relayed to family in detail- Daughters over the phone-[name], [place]  Updated and notified of patients medical condition- Patient remains unresponsive and will not open eyes to command.   Patient with increased WOB and using accessory muscles to breathe Explained to family course of therapy and the modalities   Patient with Progressive multiorgan failure with a very high probablity of a very minimal chance of meaningful recovery despite all aggressive and optimal medical therapy.  PATIENT REMAINS FULL CODE  Family understands the situation.  Patient has dying leg, resp distress Suffering and dying process They understand the prognosis and would like to discuss with rest of the family   Family are satisfied with Plan of action and management. All questions answered  Additional CC time 35 mins   Timothy Galvan Timothy Galvan, M.D.  [name]  Medical Director [facility] Medical Director [facility] Cardio-Pulmonary Department

## 2023-05-17 NOTE — Progress Notes (Signed)
Progress Note   Patient: Timothy Galvan ZDG:[id number] DOB: [birth year] DOA: 05/29/2023     2 DOS: the patient was seen and examined on 05/17/2023   Brief hospital course: Timothy Galvan is a 67 y.o. male with medical history significant for Asthma, CAD, GERD, HTN, tobacco, EtOH and cocaine use, colon cancer, PAF on eliquis, severe PAD with multiple revascularization, stroke 04/2022, who is being admitted with critical limb ischemia.  Vascular team evaluated him took him to OR. Vascular procedure 06/03/2023: 1) right axilary - right common femoral arterial bypass (6mm ePTFE) 2) thrombectomy right-to-left femoral-femoral bypass 3) right calf four compartment fasciotomies.  He is transferred to ICU for close monitoring. Very confused last night, HR elevated.  11/3: Patient tachycardic 180, got Adenosine 6mg , 12 mg without help. DCCV performed by CCM did not help. Got Cardizem which did hep. Cardiology advised Amiodarone drip. Palliiative and vascular team discussed with family.  11/4: He is restless, does not open eyes. Overnight started precedex drip. He is on amio drip per cardiology. BP lower side. Critical care team discussed with family, he remains full code.  Assessment and Plan: * Critical ischemia of extremity with history of revascularization of same extremity  Extensive PAD Continue heparin infusion as per pharmacy protocol. Vascular Dr. Lenell Antu performed right axilary - right common femoral arterial bypass, thrombectomy right-to-left femoral-femoral bypass and  right calf four compartment fasciotomies. Continue pain control. NPO for now as he is confused. Discussed with vascular team, he is at high risk for further interventions. Ongoing discussion with family by palliative team regarding goals of care. His overall prognosis poor explained to family.  Acute metabolic and toxic encephalopathy. Alcohol withdrawal, Cocaine abuse Continue CIWA withdrawal protocol He is sleepy,  thrashing arms, on precedex, wean as tolerated. Continue to monitor neurochecks.  SVT- sustained Paroxysmal Afib Post vascular intervention. Adenosine 6mg , 12 mg without help. DCCV performed with no help.  Stopped Cardizem drip, started Amiodarone drip per Cardiology recommendations. Continue heparin drip.  AKI: Lactic acidosis- Multifactorial - pre-renal, hypotension, limb ischemia, poor circulation, acute illness. Continue aggressive IV hydration. Monitor daily renal function, trend lactic acid. Avoid nephrotoxic drugs. Renally adjust medications.  Hypoglycemia: Will get lab blood sugars, fingerstick discrepancy due to poor circulation. BMP sugars 147. Hypoglycemia protocol ordered.  Hypertension. CAD (coronary artery disease) Chronic systolic and diastolic CHF Home meds- losartan, isosorbide, aspirin and atorvastatin nitroglycerin. Will continue to monitor BP, plan to start GDMT once BP improves. Caution with fluid overload. Avoid betablocker as he is cocaine user. Continue aspirin and statin once more awake.  COPD (chronic obstructive pulmonary disease) (HCC) No acute exacerbation. He is on room air. Supplemental O2 as needed. Continue home inhalers as needed  H/o CVA: Aspirin and statin once awake.    Nursing supportive care. Fall, aspiration precautions. DVT prophylaxis   Code Status: Full Code  Subjective: Patient is seen and examined today morning. He is not opening eyes, moving arms. Has mittens for safety. HR better, BP lower side. RN at bedside noted discrepancy in his blood sugars bilaterally.   Physical Exam: Vitals:   05/17/23 0416 05/17/23 0526 05/17/23 0600 05/17/23 0700  BP: 106/64 (!) 86/67 (!) 92/58 (!) 84/65  Pulse: 80     Resp:  (!) 0  (!) 23  Temp: 99.7 F (37.6 C)     TempSrc: Axillary     SpO2: 95%     Weight:      Height:        General -  Elderly African American male, sleepy but agitated. HEENT - Atraumatic head, non tender  sinuses. Lung - Clear, basal rales, rhonchi, no wheezes. Heart - S1, S2 heard, no murmurs, rubs, no pedal edema. Abdomen - Soft, non tender, bowel sounds low. Neuro - does not open eyes, moves both arms, unable to do full neuro exam. Skin-Right calf vac noted. bilateral groin dressing. Right> left leg cold.   Data Reviewed:      Latest Ref Rng & Units 05/17/2023    4:00 AM 05/16/2023    5:47 AM 06/09/2023   12:01 AM  CBC  WBC 4.0 - 10.5 K/uL 7.9  14.9  9.7   Hemoglobin 13.0 - 17.0 g/dL 65.7  84.6  96.2   Hematocrit 39.0 - 52.0 % 38.7  46.6  48.2   Platelets 150 - 400 K/uL 142  166  183       Latest Ref Rng & Units 05/17/2023   12:01 PM 05/17/2023    4:00 AM 05/16/2023    8:21 AM  BMP  Glucose 70 - 99 mg/dL 952  841  324   BUN 8 - 23 mg/dL  41  23   Creatinine 4.01 - 1.24 mg/dL  0.27  2.53   Sodium 664 - 145 mmol/L  137  136   Potassium 3.5 - 5.1 mmol/L  4.9  4.6   Chloride 98 - 111 mmol/L  104  105   CO2 22 - 32 mmol/L  22  20   Calcium 8.9 - 10.3 mg/dL  8.2  9.0    No results found.   Family Communication: Discussed with family regarding his poor prognosis. Continued goals of care discussion with family.  Disposition: Status is: Inpatient Remains inpatient appropriate because: critically ill post vascular procedure.  Planned Discharge Destination: unknown at this time as prognosis poor.     MDM level 3- Patient is post op vascular procedure, mental status poor, on amio drip with low BP. He is critically ill and will need close hemodynamic, telemetry monitoring. Overall prognosis poor.  Author: Marcelino Duster, MD 05/17/2023 1:06 PM Secure chat 7am to 7pm For on call review [email or web address].

## 2023-05-17 NOTE — Progress Notes (Signed)
Precedex is currently at max ordered dose and patient continues to be restless, agitated, thrashing around in the bed and pulling on PIV's, tubes and drains. Verifed with Lanora Manis, NP that CIWA order was still active and that if patient required PRN ativan that it is ok to give. She verified that the CIWA order is active and to monitor respiratory status closely.

## 2023-05-17 NOTE — Progress Notes (Signed)
Initial Nutrition Assessment  DOCUMENTATION CODES:   Not applicable  INTERVENTION:   RD will add supplements and vitamins with diet advancement  Pt at high refeed risk; recommend monitor potassium, magnesium and phosphorus labs daily until stable  Daily weights   NUTRITION DIAGNOSIS:   Inadequate oral intake related to inability to eat (pt with AMS) as evidenced by NPO status.  GOAL:   Patient will meet greater than or equal to 90% of their needs  MONITOR:   Diet advancement, Labs, Weight trends, Skin, I & O's  REASON FOR ASSESSMENT:   Consult Assessment of nutrition requirement/status  ASSESSMENT:   67 y/o male with h/o CAD, PAD, COPD, HTN, CVA, etoh and cocaine abuse, PAF, GERD, HLD, colon cancer, OSA and SVT who is admitted with PAF, SVT, AMS and critical leg ischemia s/p right femoral arterial bypass, thrombectomy, right-to-left femoral-femoral bypass and right calf four compartment fasciotomies 11/2.  Visited pt's room today. Pt is unable to provide any history r/t AMS. Pt remains NPO secondary to AMS. RD will monitor for GOC. Palliative care is following. RD will monitor for diet advancement vs the need for nutrition support. Pt is at high refeed risk. Per chart, pt is weight stable pta.   Medications reviewed and include: aspirin, folic acid, Mg oxide, MVI, nicotine, protonix, thiamine, 5% dextrose @50ml /hr, heparin    Labs reviewed: K 4.9 wnl, BUN 41(H), creat 1.64(H), Mg 2.4 wnl P 3.7 wnl- 11/3 Cbgs- 78, 35, 71, 36, 115, 58, 35 x 24 hrs AIC 6.1(H)- 11/3  NUTRITION - FOCUSED PHYSICAL EXAM:  Flowsheet Row Most Recent Value  Orbital Region No depletion  Upper Arm Region Mild depletion  Thoracic and Lumbar Region No depletion  Buccal Region No depletion  Temple Region No depletion  Clavicle Bone Region No depletion  Clavicle and Acromion Bone Region No depletion  Scapular Bone Region No depletion  Dorsal Hand No depletion  Patellar Region Severe depletion   Anterior Thigh Region Severe depletion  Posterior Calf Region Severe depletion  Edema (RD Assessment) None  Hair Reviewed  Eyes Reviewed  Mouth Reviewed  Skin Reviewed  Nails Reviewed   Diet Order:   Diet Order             Diet NPO time specified Except for: Ice Chips, Sips with Meds  Diet effective now                  EDUCATION NEEDS:   Not appropriate for education at this time  Skin:  Skin Assessment: Reviewed RN Assessment (incision chest and thigh)  Last BM:  PTA  Height:   Ht Readings from Last 1 Encounters:  05/27/2023 5\' 3"  (1.6 m)    Weight:   Wt Readings from Last 1 Encounters:  05/28/2023 62.8 kg    Ideal Body Weight:  56.3 kg  BMI:  Body mass index is 24.53 kg/m.  Estimated Nutritional Needs:   Kcal:  1700-1900kcal/day  Protein:  85-95g/day  Fluid:  1.5-1.7L/day  Betsey Holiday MS, RD, LDN Please refer to [facility] for RD and/or RD on-call/weekend/after hours pager

## 2023-05-17 NOTE — Progress Notes (Addendum)
Rounding Note    Patient Name: Timothy Galvan Date of Encounter: 05/17/2023  [facility] Cardiologist: Debbe Odea, MD   Subjective   Patient remains in NSR with atrial runs. He is still confused, unresponsive on interview.   Inpatient Medications    Scheduled Meds:  aspirin EC  81 mg Oral Daily   atorvastatin  40 mg Oral QHS   Chlorhexidine Gluconate Cloth  6 each Topical Daily   empagliflozin  10 mg Oral Daily   folic acid  1 mg Oral Daily   isosorbide mononitrate  120 mg Oral Daily   magnesium oxide  400 mg Oral QHS   multivitamin with minerals  1 tablet Oral Daily   nicotine  21 mg Transdermal Daily   mouth rinse  15 mL Mouth Rinse 4 times per day   pantoprazole (PROTONIX) IV  40 mg Intravenous Daily   thiamine  100 mg Oral Daily   Or   thiamine  100 mg Intravenous Daily   Continuous Infusions:  amiodarone 30 mg/hr (05/17/23 0600)   dexmedetomidine (PRECEDEX) IV infusion 0.5 mcg/kg/hr (05/17/23 0600)   heparin 1,100 Units/hr (05/17/23 0600)   lactated ringers 200 mL/hr at 05/17/23 0620   PRN Meds: acetaminophen **OR** acetaminophen, hydrALAZINE, HYDROcodone-acetaminophen, ipratropium-albuterol, LORazepam **OR** LORazepam, morphine injection, nitroGLYCERIN, ondansetron **OR** ondansetron (ZOFRAN) IV, mouth rinse, traZODone   Vital Signs    Vitals:   05/17/23 0416 05/17/23 0526 05/17/23 0600 05/17/23 0700  BP: 106/64 (!) 86/67 (!) 92/58 (!) 84/65  Pulse: 80     Resp:  (!) 0  (!) 23  Temp: 99.7 F (37.6 C)     TempSrc: Axillary     SpO2: 95%     Weight:      Height:        Intake/Output Summary (Last 24 hours) at 05/17/2023 0744 Last data filed at 05/17/2023 0600 Gross per 24 hour  Intake 2697.65 ml  Output 925 ml  Net 1772.65 ml      05/29/2023    4:00 PM 05/29/2023    1:08 AM 04/20/2023   12:51 PM  Last 3 Weights  Weight (lbs) 138 lb 7.2 oz 138 lb 138 lb  Weight (kg) 62.8 kg 62.596 kg 62.596 kg      Telemetry    Nsr atrial  runs 100-140s - Personally Reviewed  ECG    No new - Personally Reviewed  Physical Exam   GEN: No acute distress.   Neck: No JVD Cardiac: RRR, no murmurs, rubs, or gallops.  Respiratory: Clear to auscultation bilaterally. GI: Soft, nontender, non-distended  MS: No edema; No deformity. Neuro:  encephalopathic; moves extreimties Psych: Normal affect   Labs    High Sensitivity Troponin:  No results for input(s): "TROPONINIHS" in the last 720 hours.   Chemistry Recent Labs  Lab 06/06/2023 0001 06/04/2023 2212 05/16/23 0821 05/17/23 0400  NA 138  --  136 137  K 4.4  --  4.6 4.9  CL 106  --  105 104  CO2 22  --  20* 22  GLUCOSE 113*  --  163* 147*  BUN 20  --  23 41*  CREATININE 1.14  --  1.04 1.64*  CALCIUM 10.1  --  9.0 8.2*  MG  --  2.1 2.0 2.4  GFRNONAA >60  --  >60 45*  ANIONGAP 10  --  11 11    Lipids  Recent Labs  Lab 05/16/23 0821  CHOL 193  TRIG 93  HDL 68  LDLCALC 106*  CHOLHDL 2.8    Hematology Recent Labs  Lab 06/10/2023 0001 05/16/23 0547 05/17/23 0400  WBC 9.7 14.9* 7.9  RBC 5.31 5.11 4.33  HGB 15.9 15.1 12.9*  HCT 48.2 46.6 38.7*  MCV 90.8 91.2 89.4  MCH 29.9 29.5 29.8  MCHC 33.0 32.4 33.3  RDW 14.0 14.1 14.1  PLT 183 166 142*   Thyroid  Recent Labs  Lab 05/16/23 0821  TSH 0.747    BNPNo results for input(s): "BNP", "PROBNP" in the last 168 hours.  DDimer No results for input(s): "DDIMER" in the last 168 hours.   Radiology    CT HEAD WO CONTRAST ( )  Result Date: 06/05/2023 CLINICAL DATA:  Mental status change with unknown cause. EXAM: CT HEAD WITHOUT CONTRAST TECHNIQUE: Contiguous axial images were obtained from the base of the skull through the vertex without intravenous contrast. RADIATION DOSE REDUCTION: This exam was performed according to the departmental dose-optimization program which includes automated exposure control, adjustment of the mA and/or kV according to patient size and/or use of iterative reconstruction technique.  COMPARISON:  11/29/2022 FINDINGS: Brain: No evidence of acute infarction, hemorrhage, hydrocephalus, extra-axial collection or mass lesion/mass effect. Chronic small vessel ischemia in the cerebral white matter with chronic perforator infarct at the left basal ganglia and posterior right frontal cortex. Brain atrophy that is generalized. Cerebellar atrophy. Vascular: No focal hyperdense vessel or unexpected calcification. Recent enhanced scan. Skull: Normal. Negative for fracture or focal lesion. Sinuses/Orbits: No acute finding. IMPRESSION: 1. No emergent finding . 2. Chronic perforator and right frontal cortex infarcts. Electronically Signed   By: Tiburcio Pea M.D.   On: 06/03/2023 09:00    Cardiac Studies   Echo 02/2023  1. Left ventricular ejection fraction, by estimation, is 45 to 50%. The  left ventricle has mildly decreased function. The left ventricle  demonstrates global hypokinesis. Left ventricular diastolic parameters are  indeterminate.   2. Right ventricular systolic function is normal. The right ventricular  size is normal.   3. Left atrial size was mild to moderately dilated.   4. Right atrial size was mildly dilated.   5. The mitral valve is normal in structure. Mild mitral valve  regurgitation.   6. The aortic valve is tricuspid. Aortic valve regurgitation is not  visualized. Aortic valve sclerosis is present, with no evidence of aortic  valve stenosis.   7. The inferior vena cava is normal in size with greater than 50%  respiratory variability, suggesting right atrial pressure of 3 mmHg.   Patient Profile     67 y.o. 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 BP soft this AM, may need to hold this  Polysubstance use - UDS +cocaine - prior history of alcohol and tobacco use.  - CIWA per IM  For questions or updates, please contact [facility] Please consult [email or web address] for contact info under        Signed, Edy Mcbane David Stall, PA-C  05/17/2023, 7:44 AM

## 2023-05-17 NOTE — Progress Notes (Signed)
ANTICOAGULATION CONSULT NOTE  Pharmacy Consult for heparin infusion Indication: Ischemic right lower leg  Allergies  Allergen Reactions   Penicillins     Has patient had a PCN reaction causing immediate rash, facial/tongue/throat swelling, SOB or lightheadedness with hypotension: Unknown Has patient had a PCN reaction causing severe rash involving mucus membranes or skin necrosis: Unknown Has patient had a PCN reaction that required hospitalization: Unknown Has patient had a PCN reaction occurring within the last 10 years: Unknown If all of the above answers are "NO", then may proceed with Cephalosporin use.    Patient Measurements: Height: 5\' 3"  (160 cm) Weight: 62.8 kg (138 lb 7.2 oz) IBW/kg (Calculated) : 56.9 Heparin Dosing Weight: 62.6 kg  Vital Signs: Temp: 99.7 F (37.6 C) (11/04 0416) Temp Source: Axillary (11/04 0416) BP: 84/65 (11/04 0700) Pulse Rate: 80 (11/04 0416)  Labs: Recent Labs    06/01/2023 0001 05/26/2023 0830 05/16/23 0547 05/16/23 0821 05/16/23 1211 05/17/23 0400  HGB 15.9  --  15.1  --   --  12.9*  HCT 48.2  --  46.6  --   --  38.7*  PLT 183  --  166  --   --  142*  APTT 29 147*  --   --   --   --   LABPROT 14.2  --   --   --   --   --   INR 1.1  --   --   --   --   --   HEPARINUNFRC 0.10* >1.10* 0.46  --  0.41 0.28*  CREATININE 1.14  --   --  1.04  --  1.64*  CKTOTAL  --   --   --  10,993*  --   --    Estimated Creatinine Clearance: 34.2 mL/min (A) (by C-G formula based on SCr of 1.64 mg/dL (H)).  Medical History: Past Medical History:  Diagnosis Date   Asthma    Avascular necrosis of bone of hip (HCC)    Blood clot in vein    Cancer (HCC)    Carotid stenosis, bilateral    Cerebrovascular accident (CVA), unspecified mechanism (HCC)    Coronary artery disease    GERD (gastroesophageal reflux disease)    Heart disease    Hypertension    PAD (peripheral artery disease) (HCC)    Pneumonia    Medications:  PTA Meds: Eliquis 5 mg BID,  Baseline HL 0.10  Assessment: Pt is a 67 yo male presenting to ED reporting R leg & foot pain, which began to feel numb and cold.  Goal of Therapy:  Heparin level 0.3-0.7 units/ml Monitor platelets by anticoagulation protocol: Yes  Plan: heparin level therapeutic x 1 following recent rate change ---continue  heparin infusion at 1100 units/hr ---Recheck heparin level in 8 hours to confirm ---CBC daily while on heparin  Burnis Medin, PharmD, BCPS 05/17/2023 7:32 AM

## 2023-05-17 NOTE — Progress Notes (Signed)
ANTICOAGULATION CONSULT NOTE  Pharmacy Consult for Heparin Infusion Indication: Ischemic right lower leg  Patient Measurements: Height: 5\' 3"  (160 cm) Weight: 62.8 kg (138 lb 7.2 oz) IBW/kg (Calculated) : 56.9 Heparin Dosing Weight: 62.6 kg  Labs: Recent Labs    05/27/2023 0001 05/16/2023 0830 05/16/23 0547 05/16/23 0821 05/16/23 1211 05/17/23 0400 05/17/23 1201 05/17/23 2019  HGB 15.9  --  15.1  --   --  12.9*  --   --   HCT 48.2  --  46.6  --   --  38.7*  --   --   PLT 183  --  166  --   --  142*  --   --   APTT 29 147*  --   --   --   --   --   --   LABPROT 14.2  --   --   --   --   --   --   --   INR 1.1  --   --   --   --   --   --   --   HEPARINUNFRC 0.10* >1.10* 0.46  --    < > 0.28* 0.41 0.17*  CREATININE 1.14  --   --  1.04  --  1.64*  --   --   CKTOTAL  --   --   --  10,993*  --   --   --   --    < > = values in this interval not displayed.   Estimated Creatinine Clearance: 34.2 mL/min (A) (by C-G formula based on SCr of 1.64 mg/dL (H)).  Medical History: Past Medical History:  Diagnosis Date   Asthma    Avascular necrosis of bone of hip (HCC)    Blood clot in vein    Cancer (HCC)    Carotid stenosis, bilateral    Cerebrovascular accident (CVA), unspecified mechanism (HCC)    Coronary artery disease    GERD (gastroesophageal reflux disease)    Heart disease    Hypertension    PAD (peripheral artery disease) (HCC)    Pneumonia    Medications:  PTA Meds: Eliquis 5 mg BID, Baseline HL 0.10  Assessment: Pt is a 67 yo male presenting to ED reporting R leg & foot pain, which began to feel numb and cold.  1104 2019 HL 0.17, subthera; 1100 un/hr  Goal of Therapy:  Heparin level 0.3-0.7 units/ml Monitor platelets by anticoagulation protocol: Yes  Plan:  --Heparin level is subtherapeutic --Heparin 1900 unit IV bolus and increase heparin infusion to 1300 units/hr --Re-check HL 6 hours from rate change --Daily CBC per protocol while on IV heparin  Tressie Ellis 05/17/2023 8:48 PM

## 2023-05-17 NOTE — Progress Notes (Signed)
I spoke at length with the patient's daughter.  At this point given his overall condition and comorbidities as well as physical examination which shows his legs are essentially cadaveric from the knees down and there is no identifiable distal target for bypass nor do I believe he would tolerate further extensive revascularization.  I have recommended bilateral above-knee amputations.  Potentially these would be guillotine amputations leaving them open and then closing them later.  I have met with anesthesia to see if they feel his a candidate for general anesthesia.  The daughter voiced appropriate concern and asked excellent questions.  She wishes to discuss this with the family and will get back to me regarding moving forward with bilateral above-knee amputation versus palliative care

## 2023-05-17 NOTE — Progress Notes (Signed)
                                                     Palliative Care Progress Note, Assessment & Plan   Patient Name: Timothy Galvan       Date: 05/17/2023 DOB: 01/22/1956  Age: 67 y.o. MRN#: [id number] Attending Physician: Marcelino Duster, MD Primary Care Physician: [facility] Admit Date: 06/10/2023  Subjective: Patient is lying in bed.  He moans and groans but does not open his eyes or acknowledge my presence.  He is unable to make his wishes known.  RN is at bedside during my visit.  No other family or friends present during my visit.  HPI: 67 y.o. male  with past medical history of CAD, hypertension, tobacco, EtOH and cocaine use, colon cancer, PAF on Eliquis, severe PAD with multiple revascularizations, CVA 04/2022 admitted on 05/27/2023 with critical limb ischemia.  Vascular surgical team urgently consulted.   Vascular procedure 06/11/2023: 1) right axilary - right common femoral arterial bypass 2) thrombectomy right-to-left femoral-femoral bypass 3) right calf four compartment fasciotomies.   Overnight developed unstable arrhythmia requiring chemical and electrical cardioversion   11/3 vascular surgery recommendations for comfort measures due to overall very poor prognosis related to concern of fem-fem bypass appearing to be rethrombused, doppler flow in popliteal arteries only Not an appropriate candidate at this time for further vascular interventions   Palliative medicine was consulted for assisting with goals of care conversations.  Summary of counseling/coordination of care: Extensive chart review completed prior to meeting patient including labs, vital signs, imaging, progress notes, orders, and available advanced directive documents from current and previous encounters.   After reviewing the patient's chart and  assessing the patient at bedside, I spoke with RN in regards to plan of care.  Patient has not been interactive or alert for her during this shift. We discussed patient's long periods of apnea but that he is not experiencing distress. No adjustment to [facility] needed at this time.   As per chart review, CCM Dr. Belia Heman has spoken with family.  They remain in agreement with full code and full scope.  PMT remains available and will continue to support patient and family throughout his hospitalization.  Physical Exam Vitals reviewed.  Constitutional:      General: He is not in acute distress. Cardiovascular:     Rate and Rhythm: Normal rate.     Pulses: Normal pulses.  Pulmonary:     Effort: Pulmonary effort is normal.  Abdominal:     Palpations: Abdomen is soft.  Neurological:     Comments: Moans/groans             Total Time 25 minutes   Time spent includes: Detailed review of medical records (labs, imaging, vital signs), medically appropriate exam (mental status, respiratory, cardiac, skin), discussed with treatment team, counseling and educating patient, family and staff, documenting clinical information, medication management and coordination of care.  Samara Deist L. Bonita Quin, DNP, FNP-BC Palliative Medicine Team

## 2023-05-17 NOTE — Progress Notes (Signed)
NAME:  Timothy Galvan, MRN:  [id number], DOB:  12-01-1955, LOS: 2 ADMISSION DATE:  05/26/2023, CONSULTATION DATE:  05/16/2023 REFERRING MD:  Marcelino Duster, MD, CHIEF COMPLAINT:  SVT   History of Present Illness:   67 year old male with a past medical history of alcohol and drug use who is preseting to the hospital for acute limb ischemia.  Patient was admitted on 05/21/2023 with severe pain in his right leg, noted to have severe hypertension with a mottled limb. 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 There was thrombosis of a fem-fem bypass, R. SFA, and Right anterior tibial and peroneal arteries, with a patent right posterior tibial artery. Given concern for critical limb ischemia, he was started on heparin and vascular surgery was consulted. He was taken to the OR for a right axillary to bifemoral bypass and right calf fasciotomy yesterday. Intraoperative findings suggest that ischemic limb has been ongoing for a while given behavior of the muscle when touched by electrocautery. Patient likely to require a right above knee amputation.  He was extubated in PACU and was admitted to the ICU post operatively for continued monitoring and for management. He was confused overnight. 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 Patient did not revert with 3 doses of adenosine, nor with three attempts at electrical cardioversion. He was subsequently started on IV diltiazem with improvement.  We are called to the bedside due to narrow complex tachycardia, suggestive of AVNRT. 3 doses of adenosine attempted (6 mg followed by 12 mg x2) and 3 attempts at electrical cardioversion attempted, without success. Diltiazem gtt initiated.  Pertinent  Medical History   Asthma PAD s/p fem-fem bypass CVA CAD HTN EtOH  abuse Cocaine Abuse  Significant Hospital Events: Including procedures, antibiotic start and stop dates in addition to other pertinent events   05/24/2023: admission, axillary to bifemoral bypass & fasciotomy 05/16/2023: SVT, on diltiazem gtt 11/4 on AMIO, HEP, increased WOB,   Interim History / Subjective:  Remains critically ill Severe limb ischemia  S/p cardioversion  Objective   Blood pressure (!) 84/65, pulse 80, temperature 99.7 F (37.6 C), temperature source Axillary, resp. rate (!) 23, height 5\' 3"  (1.6 m), weight 62.8 kg, SpO2 95%.        Intake/Output Summary (Last 24 hours) at 05/17/2023 0742 Last data filed at 05/17/2023 0600 Gross per 24 hour  Intake 2697.65 ml  Output 925 ml  Net 1772.65 ml   Filed Weights   05/27/2023 0108 05/17/2023 1600  Weight: 62.6 kg 62.8 kg       REVIEW OF SYSTEMS  PATIENT IS UNABLE TO PROVIDE COMPLETE REVIEW OF SYSTEMS DUE TO SEVERE CRITICAL ILLNESS   PHYSICAL EXAMINATION:  GENERAL:critically ill appearing, +resp distress EYES: Pupils equal, round, reactive to light.  No scleral icterus.  MOUTH: Moist mucosal membrane NECK: Supple.  PULMONARY: Lungs clear to auscultation CARDIOVASCULAR: S1 and S2.  Regular rate and rhythm GASTROINTESTINAL: Soft, nontender, -distended. Positive bowel sounds.  MUSCULOSKELETAL: No swelling, clubbing, or edema.  NEUROLOGIC: obtunded SKIN: fasciotomy with wound vac over RT calf, cold extremities    Assessment & Plan:   67 yo AAM with acute critical limb ischemia  s/p revascularization and fasciotomy. Will likely require right above knee amputation. With unstable arrhythmia with cocaine poisoning complicated by ETOH withdrawal and severe acute metabolic encephalopathy   HIGH RISK  FOR INTUBATION AND CARDIAC ARREST - Intermittent chest x-ray & ABG PRN Oxygen as needed   NEUROLOGY ACUTE METABOLIC ENCEPHALOPATHY Pain, delirium, ETOH withdrawal High dose thiamine and precedex -IV Morphine for  pain -IV lorazepam for CIWA   Cardiovascular-severe PAD, SVT/AVNRT -D/C Losartan -D/C Metoprolol -appreciate input from cardiology and vascular surgery  ACUTE KIDNEY INJURY/Renal Failure -continue Foley Catheter-assess need -Avoid nephrotoxic agents -Follow urine output, BMP -Ensure adequate renal perfusion, optimize oxygenation -Renal dose medications -Metabolic acidosis -Rhabdomyolysis   Intake/Output Summary (Last 24 hours) at 05/17/2023 0749 Last data filed at 05/17/2023 0600 Gross per 24 hour  Intake 2697.65 ml  Output 925 ml  Net 1772.65 ml      Latest Ref Rng & Units 05/17/2023    4:00 AM 05/16/2023    8:21 AM 05/29/2023   12:01 AM  BMP  Glucose 70 - 99 mg/dL 784  696  295   BUN 8 - 23 mg/dL 41  23  20   Creatinine 0.61 - 1.24 mg/dL 2.84  1.32  4.40   Sodium 135 - 145 mmol/L 137  136  138   Potassium 3.5 - 5.1 mmol/L 4.9  4.6  4.4   Chloride 98 - 111 mmol/L 104  105  106   CO2 22 - 32 mmol/L 22  20  22    Calcium 8.9 - 10.3 mg/dL 8.2  9.0  10.2       ENDO - ICU hypoglycemic\Hyperglycemia protocol -check FSBS per protocol   GI GI PROPHYLAXIS as indicated  NUTRITIONAL STATUS DIET-->NPO Constipation protocol as indicated   ELECTROLYTES -follow labs as needed -replace as needed -pharmacy consultation and following     Best Practice (right click and "Reselect all SmartList Selections" daily)   Diet/type: NPO DVT prophylaxis: systemic heparin GI prophylaxis: PPI Lines: N/A Foley:  N/A Code Status:  full code Last date of multidisciplinary goals of care discussion [05/16/2023]  Labs   CBC: Recent Labs  Lab 05/30/2023 0001 05/16/23 0547 05/17/23 0400  WBC 9.7 14.9* 7.9  NEUTROABS 5.8  --   --   HGB 15.9 15.1 12.9*  HCT 48.2 46.6 38.7*  MCV 90.8 91.2 89.4  PLT 183 166 142*    Basic Metabolic Panel: Recent Labs  Lab 05/30/2023 0001 05/16/2023 2212 05/16/23 0821 05/17/23 0400  NA 138  --  136 137  K 4.4  --  4.6 4.9  CL 106  --  105  104  CO2 22  --  20* 22  GLUCOSE 113*  --  163* 147*  BUN 20  --  23 41*  CREATININE 1.14  --  1.04 1.64*  CALCIUM 10.1  --  9.0 8.2*  MG  --  2.1 2.0 2.4  PHOS  --   --  3.7  --    GFR: Estimated Creatinine Clearance: 34.2 mL/min (A) (by C-G formula based on SCr of 1.64 mg/dL (H)). Recent Labs  Lab 05/28/2023 0001 05/16/23 0547 05/16/23 0821 05/16/23 1211 05/17/23 0400  WBC 9.7 14.9*  --   --  7.9  LATICACIDVEN  --   --  3.6* 7.1*  --     Liver Function Tests: No results for input(s): "AST", "ALT", "ALKPHOS", "BILITOT", "PROT", "ALBUMIN" in the last 168 hours. No results for input(s): "LIPASE", "AMYLASE" in the last 168 hours. No results for input(s): "AMMONIA" in the last 168 hours.  ABG    Component Value Date/Time   HCO3 22.8 05/16/2023 0816   ACIDBASEDEF 1.4 05/16/2023 0816  O2SAT 85.8 05/16/2023 0816     Coagulation Profile: Recent Labs  Lab 06/06/2023 0001  INR 1.1    Cardiac Enzymes: Recent Labs  Lab 05/16/23 0821  CKTOTAL 10,993*    HbA1C: Hgb A1c MFr Bld  Date/Time Value Ref Range Status  05/16/2023 08:21 AM 6.1 (H) 4.8 - 5.6 % Final    Comment:    (NOTE) Pre diabetes:          5.7%-6.4%  Diabetes:              >6.4%  Glycemic control for   <7.0% adults with diabetes   06/01/2022 02:31 PM 6.3 (H) 4.8 - 5.6 % Final    Comment:    (NOTE)         Prediabetes: 5.7 - 6.4         Diabetes: >6.4         Glycemic control for adults with diabetes: <7.0     CBG: Recent Labs  Lab 05/23/2023 1557 05/17/23 0712 05/17/23 0715  GLUCAP 120* 35* 58*     DVT/GI PRX  assessed I Assessed the need for Labs I Assessed the need for Foley I Assessed the need for Central Venous Line Family Discussion when available I Assessed the need for Mobilization I made an Assessment of medications to be adjusted accordingly Safety Risk assessment completed  CASE DISCUSSED IN MULTIDISCIPLINARY ROUNDS WITH ICU TEAM     Critical Care Time devoted to  patient care services described in this note is 55 minutes.  Critical care was necessary to treat /prevent imminent and life-threatening deterioration. Overall, patient is critically ill, prognosis is guarded.  Patient with Multiorgan failure and at high risk for cardiac arrest and death.    Lucie Leather, M.D.  [name]  Medical Director [facility] Medical Director [facility] Cardio-Pulmonary Department

## 2023-05-18 ENCOUNTER — Inpatient Hospital Stay (HOSPITAL_COMMUNITY)
Admit: 2023-05-18 | Discharge: 2023-05-18 | Disposition: A | Discharge status: Home / Self Care | Payer: Medicare Other | Attending: Pulmonary Disease

## 2023-05-18 ENCOUNTER — Inpatient Hospital Stay: Payer: Medicare Other | Admitting: Certified Registered Nurse Anesthetist

## 2023-05-18 ENCOUNTER — Inpatient Hospital Stay: Payer: Medicare Other

## 2023-05-18 ENCOUNTER — Encounter: Admission: EM | Disposition: E | Payer: Self-pay | Source: Home / Self Care | Attending: Internal Medicine

## 2023-05-18 ENCOUNTER — Encounter: Payer: Self-pay | Admitting: Student in an Organized Health Care Education/Training Program

## 2023-05-18 ENCOUNTER — Other Ambulatory Visit: Payer: Self-pay

## 2023-05-18 DIAGNOSIS — I70263 Atherosclerosis of native arteries of extremities with gangrene, bilateral legs: Secondary | ICD-10-CM | POA: Diagnosis not present

## 2023-05-18 DIAGNOSIS — I469 Cardiac arrest, cause unspecified: Secondary | ICD-10-CM | POA: Diagnosis not present

## 2023-05-18 DIAGNOSIS — I70229 Atherosclerosis of native arteries of extremities with rest pain, unspecified extremity: Secondary | ICD-10-CM | POA: Diagnosis not present

## 2023-05-18 DIAGNOSIS — Z95828 Presence of other vascular implants and grafts: Secondary | ICD-10-CM | POA: Diagnosis not present

## 2023-05-18 DIAGNOSIS — Z9889 Other specified postprocedural states: Secondary | ICD-10-CM | POA: Diagnosis not present

## 2023-05-18 DIAGNOSIS — Z01810 Encounter for preprocedural cardiovascular examination: Secondary | ICD-10-CM | POA: Diagnosis not present

## 2023-05-18 DIAGNOSIS — R7989 Other specified abnormal findings of blood chemistry: Secondary | ICD-10-CM

## 2023-05-18 DIAGNOSIS — F1090 Alcohol use, unspecified, uncomplicated: Secondary | ICD-10-CM | POA: Diagnosis not present

## 2023-05-18 DIAGNOSIS — I998 Other disorder of circulatory system: Secondary | ICD-10-CM | POA: Diagnosis not present

## 2023-05-18 DIAGNOSIS — R748 Abnormal levels of other serum enzymes: Secondary | ICD-10-CM

## 2023-05-18 DIAGNOSIS — I480 Paroxysmal atrial fibrillation: Secondary | ICD-10-CM

## 2023-05-18 HISTORY — PX: AMPUTATION: SHX166

## 2023-05-18 HISTORY — PX: APPLICATION OF WOUND VAC: SHX5189

## 2023-05-18 LAB — CBC WITH DIFFERENTIAL/PLATELET
Abs Immature Granulocytes: 0.05 10*3/uL (ref 0.00–0.07)
Basophils Absolute: 0 10*3/uL (ref 0.0–0.1)
Basophils Relative: 0 %
Eosinophils Absolute: 0 10*3/uL (ref 0.0–0.5)
Eosinophils Relative: 0 %
HCT: 32.5 % — ABNORMAL LOW (ref 39.0–52.0)
Hemoglobin: 10.6 g/dL — ABNORMAL LOW (ref 13.0–17.0)
Immature Granulocytes: 1 %
Lymphocytes Relative: 10 %
Lymphs Abs: 1 10*3/uL (ref 0.7–4.0)
MCH: 29.7 pg (ref 26.0–34.0)
MCHC: 32.6 g/dL (ref 30.0–36.0)
MCV: 91 fL (ref 80.0–100.0)
Monocytes Absolute: 1.8 10*3/uL — ABNORMAL HIGH (ref 0.1–1.0)
Monocytes Relative: 18 %
Neutro Abs: 6.7 10*3/uL (ref 1.7–7.7)
Neutrophils Relative %: 71 %
Platelets: 164 10*3/uL (ref 150–400)
RBC: 3.57 MIL/uL — ABNORMAL LOW (ref 4.22–5.81)
RDW: 14.1 % (ref 11.5–15.5)
Smear Review: NORMAL
WBC: 9.5 10*3/uL (ref 4.0–10.5)
nRBC: 0.2 % (ref 0.0–0.2)

## 2023-05-18 LAB — BLOOD GAS, ARTERIAL
Acid-base deficit: 2.3 mmol/L — ABNORMAL HIGH (ref 0.0–2.0)
Acid-base deficit: 9.6 mmol/L — ABNORMAL HIGH (ref 0.0–2.0)
Bicarbonate: 16.5 mmol/L — ABNORMAL LOW (ref 20.0–28.0)
Bicarbonate: 21.2 mmol/L (ref 20.0–28.0)
FIO2: 40 %
MECHVT: 450 mL
Mechanical Rate: 16
O2 Content: 3 L/min
O2 Saturation: 92.4 %
O2 Saturation: 97.3 %
PEEP: 5 cmH2O
Patient temperature: 37
Patient temperature: 37
pCO2 arterial: 32 mm[Hg] (ref 32–48)
pCO2 arterial: 36 mm[Hg] (ref 32–48)
pH, Arterial: 7.27 — ABNORMAL LOW (ref 7.35–7.45)
pH, Arterial: 7.43 (ref 7.35–7.45)
pO2, Arterial: 68 mm[Hg] — ABNORMAL LOW (ref 83–108)
pO2, Arterial: 78 mm[Hg] — ABNORMAL LOW (ref 83–108)

## 2023-05-18 LAB — BLOOD GAS, VENOUS
Acid-base deficit: 22.6 mmol/L — ABNORMAL HIGH (ref 0.0–2.0)
Bicarbonate: 10.4 mmol/L — ABNORMAL LOW (ref 20.0–28.0)
FIO2: 40 %
MECHVT: 450 mL
Mechanical Rate: 16
O2 Saturation: 77.1 %
PEEP: 5 cmH2O
Patient temperature: 37
pCO2, Ven: 53 mm[Hg] (ref 44–60)
pH, Ven: <6.95 — CL (ref 7.25–7.43)
pO2, Ven: 61 mm[Hg] — ABNORMAL HIGH (ref 32–45)

## 2023-05-18 LAB — CBC
HCT: 35.8 % — ABNORMAL LOW (ref 39.0–52.0)
HCT: 30.9 % — ABNORMAL LOW (ref 39.0–52.0)
Hemoglobin: 11.8 g/dL — ABNORMAL LOW (ref 13.0–17.0)
Hemoglobin: 9.1 g/dL — ABNORMAL LOW (ref 13.0–17.0)
MCH: 30 pg (ref 26.0–34.0)
MCH: 29.8 pg (ref 26.0–34.0)
MCHC: 33 g/dL (ref 30.0–36.0)
MCHC: 29.4 g/dL — ABNORMAL LOW (ref 30.0–36.0)
MCV: 91.1 fL (ref 80.0–100.0)
MCV: 101.3 fL — ABNORMAL HIGH (ref 80.0–100.0)
Platelets: 132 10*3/uL — ABNORMAL LOW (ref 150–400)
Platelets: 150 10*3/uL (ref 150–400)
RBC: 3.93 MIL/uL — ABNORMAL LOW (ref 4.22–5.81)
RBC: 3.05 MIL/uL — ABNORMAL LOW (ref 4.22–5.81)
RDW: 14.3 % (ref 11.5–15.5)
RDW: 14.5 % (ref 11.5–15.5)
WBC: 8.9 10*3/uL (ref 4.0–10.5)
WBC: 8.8 10*3/uL (ref 4.0–10.5)
nRBC: 0.2 % (ref 0.0–0.2)
nRBC: 2.7 % — ABNORMAL HIGH (ref 0.0–0.2)

## 2023-05-18 LAB — CK: Total CK: 31352 U/L — ABNORMAL HIGH (ref 49–397)

## 2023-05-18 LAB — LACTIC ACID, PLASMA
Lactic Acid, Venous: 5.7 mmol/L (ref 0.5–1.9)
Lactic Acid, Venous: >9 mmol/L (ref 0.5–1.9)

## 2023-05-18 LAB — MAGNESIUM: Magnesium: 2.4 mg/dL (ref 1.7–2.4)

## 2023-05-18 LAB — BASIC METABOLIC PANEL
Anion gap: 13 (ref 5–15)
Anion gap: 21 — ABNORMAL HIGH (ref 5–15)
BUN: 38 mg/dL — ABNORMAL HIGH (ref 8–23)
BUN: 38 mg/dL — ABNORMAL HIGH (ref 8–23)
CO2: 20 mmol/L — ABNORMAL LOW (ref 22–32)
CO2: 9 mmol/L — ABNORMAL LOW (ref 22–32)
Calcium: 7.5 mg/dL — ABNORMAL LOW (ref 8.9–10.3)
Calcium: 7.2 mg/dL — ABNORMAL LOW (ref 8.9–10.3)
Chloride: 102 mmol/L (ref 98–111)
Chloride: 104 mmol/L (ref 98–111)
Creatinine, Ser: 1.26 mg/dL — ABNORMAL HIGH (ref 0.61–1.24)
Creatinine, Ser: 2.46 mg/dL — ABNORMAL HIGH (ref 0.61–1.24)
GFR, Estimated: >60 mL/min (ref >60)
GFR, Estimated: 28 mL/min — ABNORMAL LOW (ref >60)
Glucose, Bld: 95 mg/dL (ref 70–99)
Glucose, Bld: 154 mg/dL — ABNORMAL HIGH (ref 70–99)
Potassium: 4.4 mmol/L (ref 3.5–5.1)
Potassium: 7.4 mmol/L (ref 3.5–5.1)
Sodium: 135 mmol/L (ref 135–145)
Sodium: 134 mmol/L — ABNORMAL LOW (ref 135–145)

## 2023-05-18 LAB — TROPONIN I (HIGH SENSITIVITY)
Troponin I (High Sensitivity): 3542 ng/L (ref <18)
Troponin I (High Sensitivity): 5216 ng/L (ref <18)
Troponin I (High Sensitivity): 11231 ng/L (ref <18)
Troponin I (High Sensitivity): 11071 ng/L (ref <18)
Troponin I (High Sensitivity): >24000 ng/L (ref <18)

## 2023-05-18 LAB — ECHOCARDIOGRAM COMPLETE
Height: 63 in
S' Lateral: 4.7 cm
Weight: 2067.03 [oz_av]

## 2023-05-18 LAB — PROTIME-INR
INR: 1.2 (ref 0.8–1.2)
Prothrombin Time: 15.6 s — ABNORMAL HIGH (ref 11.4–15.2)

## 2023-05-18 LAB — GLUCOSE, CAPILLARY
Glucose-Capillary: 175 mg/dL — ABNORMAL HIGH (ref 70–99)
Glucose-Capillary: 35 mg/dL — CL (ref 70–99)
Glucose-Capillary: 61 mg/dL — ABNORMAL LOW (ref 70–99)
Glucose-Capillary: 61 mg/dL — ABNORMAL LOW (ref 70–99)
Glucose-Capillary: 65 mg/dL — ABNORMAL LOW (ref 70–99)
Glucose-Capillary: 74 mg/dL (ref 70–99)
Glucose-Capillary: 80 mg/dL (ref 70–99)

## 2023-05-18 LAB — PHOSPHORUS: Phosphorus: 3.1 mg/dL (ref 2.5–4.6)

## 2023-05-18 LAB — GLUCOSE, RANDOM
Glucose, Bld: 165 mg/dL — ABNORMAL HIGH (ref 70–99)
Glucose, Bld: 108 mg/dL — ABNORMAL HIGH (ref 70–99)
Glucose, Bld: 157 mg/dL — ABNORMAL HIGH (ref 70–99)
Glucose, Bld: 193 mg/dL — ABNORMAL HIGH (ref 70–99)

## 2023-05-18 LAB — HEPARIN LEVEL (UNFRACTIONATED)
Heparin Unfractionated: 0.55 [IU]/mL (ref 0.30–0.70)
Heparin Unfractionated: 0.52 [IU]/mL (ref 0.30–0.70)

## 2023-05-18 LAB — APTT: aPTT: 36 s (ref 24–36)

## 2023-05-18 SURGERY — AMPUTATION, ABOVE KNEE
Anesthesia: General | Site: Leg Upper | Laterality: Bilateral

## 2023-05-18 MED ORDER — FENTANYL CITRATE (PF) 100 MCG/2ML IJ SOLN
INTRAMUSCULAR | Status: AC
  Filled 2023-05-18: qty 2

## 2023-05-18 MED ORDER — DEXTROSE 50 % IV SOLN
INTRAVENOUS | Status: AC
  Administered 2023-05-18: 25 g via INTRAVENOUS
  Filled 2023-05-18: qty 50

## 2023-05-18 MED ORDER — LACTATED RINGERS IV BOLUS
500.0000 mL | Freq: Once | INTRAVENOUS | Status: AC
  Administered 2023-05-18: 500 mL via INTRAVENOUS

## 2023-05-18 MED ORDER — LIDOCAINE HCL (PF) 2 % IJ SOLN
INTRAMUSCULAR | Status: AC
  Filled 2023-05-18: qty 5

## 2023-05-18 MED ORDER — SODIUM BICARBONATE 8.4 % IV SOLN
50.0000 meq | Freq: Once | INTRAVENOUS | Status: AC
  Administered 2023-05-18: 50 meq via INTRAVENOUS

## 2023-05-18 MED ORDER — LORAZEPAM 2 MG/ML IJ SOLN: 2.0000 mg | INTRAMUSCULAR | Status: DC | PRN

## 2023-05-18 MED ORDER — FENTANYL 2500MCG IN NS 250ML (10MCG/ML) PREMIX INFUSION
25.0000 ug/h | INTRAVENOUS | Status: DC
  Administered 2023-05-18: 50 ug/h via INTRAVENOUS
  Filled 2023-05-18: qty 250

## 2023-05-18 MED ORDER — ETOMIDATE 2 MG/ML IV SOLN
INTRAVENOUS | Status: DC | PRN
  Administered 2023-05-18: 20 mg via INTRAVENOUS

## 2023-05-18 MED ORDER — HYDROMORPHONE HCL 1 MG/ML IJ SOLN
0.5000 mg | Freq: Once | INTRAMUSCULAR | Status: AC
  Administered 2023-05-18: 0.5 mg via INTRAVENOUS

## 2023-05-18 MED ORDER — PHENYLEPHRINE HCL-NACL 20-0.9 MG/250ML-% IV SOLN
INTRAVENOUS | Status: AC
  Filled 2023-05-18: qty 250

## 2023-05-18 MED ORDER — ACETAMINOPHEN 650 MG RE SUPP: 650.0000 mg | Freq: Four times a day (QID) | RECTAL | Status: DC | PRN

## 2023-05-18 MED ORDER — ACETAMINOPHEN 10 MG/ML IV SOLN
INTRAVENOUS | Status: DC | PRN
  Administered 2023-05-18: 1000 mg via INTRAVENOUS

## 2023-05-18 MED ORDER — PHENYLEPHRINE HCL-NACL 20-0.9 MG/250ML-% IV SOLN
INTRAVENOUS | Status: DC | PRN
  Administered 2023-05-18: 30 ug/min via INTRAVENOUS

## 2023-05-18 MED ORDER — ROCURONIUM BROMIDE 100 MG/10ML IV SOLN
INTRAVENOUS | Status: DC | PRN
  Administered 2023-05-18: 40 mg via INTRAVENOUS

## 2023-05-18 MED ORDER — SODIUM BICARBONATE 8.4 % IV SOLN: 100.0000 meq | Freq: Once | INTRAVENOUS | Status: DC

## 2023-05-18 MED ORDER — EPINEPHRINE HCL 5 MG/250ML IV SOLN IN NS
0.5000 ug/min | INTRAVENOUS | Status: DC
  Administered 2023-05-18: 1 ug/min via INTRAVENOUS
  Administered 2023-05-19: 12 ug/min via INTRAVENOUS
  Filled 2023-05-18 (×2): qty 250

## 2023-05-18 MED ORDER — LORAZEPAM 2 MG/ML IJ SOLN
1.0000 mg | Freq: Once | INTRAMUSCULAR | Status: AC | PRN
  Administered 2023-05-18: 1 mg via INTRAVENOUS
  Filled 2023-05-18: qty 1

## 2023-05-18 MED ORDER — NOREPINEPHRINE 4 MG/250ML-% IV SOLN
INTRAVENOUS | Status: AC
Start: 2023-05-18 — End: ?
  Filled 2023-05-18: qty 250

## 2023-05-18 MED ORDER — ASPIRIN 81 MG PO CHEW: 81.0000 mg | CHEWABLE_TABLET | Freq: Every day | ORAL | Status: DC

## 2023-05-18 MED ORDER — HYDROCORTISONE SOD SUC (PF) 100 MG IJ SOLR
50.0000 mg | Freq: Three times a day (TID) | INTRAMUSCULAR | Status: DC
  Administered 2023-05-18 – 2023-05-19 (×4): 50 mg via INTRAVENOUS
  Filled 2023-05-18 (×4): qty 2

## 2023-05-18 MED ORDER — PROPOFOL 10 MG/ML IV BOLUS
INTRAVENOUS | Status: DC | PRN
  Administered 2023-05-18: 30 mg via INTRAVENOUS

## 2023-05-18 MED ORDER — NOREPINEPHRINE 16 MG/250ML-% IV SOLN
0.0000 ug/min | INTRAVENOUS | Status: DC
  Administered 2023-05-19: 18 ug/min via INTRAVENOUS
  Administered 2023-05-19: 28 ug/min via INTRAVENOUS
  Filled 2023-05-18 (×2): qty 250

## 2023-05-18 MED ORDER — CEFAZOLIN SODIUM-DEXTROSE 2-3 GM-%(50ML) IV SOLR
INTRAVENOUS | Status: DC | PRN
  Administered 2023-05-18: 2 g via INTRAVENOUS

## 2023-05-18 MED ORDER — ACETAMINOPHEN 10 MG/ML IV SOLN
INTRAVENOUS | Status: AC
  Filled 2023-05-18: qty 100

## 2023-05-18 MED ORDER — SODIUM CHLORIDE 0.9 % IV SOLN: INTRAVENOUS | Status: DC | PRN

## 2023-05-18 MED ORDER — CALCIUM CHLORIDE 10 % IV SOLN
INTRAVENOUS | Status: AC
Start: 2023-05-18 — End: ?
  Filled 2023-05-18: qty 10

## 2023-05-18 MED ORDER — HYDROMORPHONE HCL 1 MG/ML IJ SOLN
0.5000 mg | INTRAMUSCULAR | Status: DC | PRN
  Administered 2023-05-18 (×2): 1 mg via INTRAVENOUS
  Filled 2023-05-18 (×2): qty 1

## 2023-05-18 MED ORDER — HYDROMORPHONE HCL 1 MG/ML IJ SOLN
0.5000 mg | INTRAMUSCULAR | Status: DC | PRN
  Administered 2023-05-18: 0.5 mg via INTRAVENOUS
  Filled 2023-05-18: qty 1

## 2023-05-18 MED ORDER — ACETAMINOPHEN 325 MG PO TABS: 650.0000 mg | ORAL_TABLET | Freq: Four times a day (QID) | ORAL | Status: DC | PRN

## 2023-05-18 MED ORDER — DEXMEDETOMIDINE HCL IN NACL 80 MCG/20ML IV SOLN
INTRAVENOUS | Status: DC | PRN
  Administered 2023-05-18: 12 ug via INTRAVENOUS

## 2023-05-18 MED ORDER — 0.9 % SODIUM CHLORIDE (POUR BTL) OPTIME
TOPICAL | Status: DC | PRN
  Administered 2023-05-18: 500 mL

## 2023-05-18 MED ORDER — POLYETHYLENE GLYCOL 3350 17 G PO PACK
17.0000 g | PACK | Freq: Every day | ORAL | Status: DC
  Filled 2023-05-18: qty 1

## 2023-05-18 MED ORDER — PROPOFOL 1000 MG/100ML IV EMUL
0.0000 ug/kg/min | INTRAVENOUS | Status: DC
  Administered 2023-05-18: 5 ug/kg/min via INTRAVENOUS
  Filled 2023-05-18: qty 100

## 2023-05-18 MED ORDER — HYDROCODONE-ACETAMINOPHEN 5-325 MG PO TABS: 1.0000 | ORAL_TABLET | ORAL | Status: DC | PRN

## 2023-05-18 MED ORDER — HYDROMORPHONE HCL 1 MG/ML IJ SOLN
0.5000 mg | INTRAMUSCULAR | Status: DC | PRN
  Administered 2023-05-18: 0.5 mg via INTRAVENOUS
  Filled 2023-05-18 (×2): qty 1

## 2023-05-18 MED ORDER — NOREPINEPHRINE 4 MG/250ML-% IV SOLN
0.0000 ug/min | INTRAVENOUS | Status: DC
  Administered 2023-05-18: 20 ug/min via INTRAVENOUS

## 2023-05-18 MED ORDER — SODIUM BICARBONATE 8.4 % IV SOLN
INTRAVENOUS | Status: DC
  Filled 2023-05-18: qty 150

## 2023-05-18 MED ORDER — MAGNESIUM OXIDE 400 MG PO TABS
400.0000 mg | ORAL_TABLET | Freq: Every day | ORAL | Status: DC
  Filled 2023-05-18: qty 1

## 2023-05-18 MED ORDER — ETOMIDATE 2 MG/ML IV SOLN
INTRAVENOUS | Status: AC
  Filled 2023-05-18: qty 10

## 2023-05-18 MED ORDER — DEXTROSE 50 % IV SOLN: 12.5000 g | INTRAVENOUS | Status: AC

## 2023-05-18 MED ORDER — HYDROCORTISONE SOD SUC (PF) 100 MG IJ SOLR
INTRAMUSCULAR | Status: AC
  Filled 2023-05-18: qty 2

## 2023-05-18 MED ORDER — SODIUM BICARBONATE 8.4 % IV SOLN
50.0000 meq | Freq: Once | INTRAVENOUS | Status: AC
  Administered 2023-05-19: 50 meq via INTRAVENOUS
  Filled 2023-05-18: qty 50

## 2023-05-18 MED ORDER — DOCUSATE SODIUM 50 MG/5ML PO LIQD
100.0000 mg | Freq: Two times a day (BID) | ORAL | Status: DC
  Filled 2023-05-18: qty 10

## 2023-05-18 MED ORDER — INSULIN ASPART 100 UNIT/ML IV SOLN
10.0000 [IU] | Freq: Once | INTRAVENOUS | Status: AC
  Administered 2023-05-19: 10 [IU] via INTRAVENOUS
  Filled 2023-05-18: qty 0.1

## 2023-05-18 MED ORDER — FENTANYL CITRATE (PF) 100 MCG/2ML IJ SOLN
INTRAMUSCULAR | Status: DC | PRN
  Administered 2023-05-18: 25 ug via INTRAVENOUS
  Administered 2023-05-18 (×2): 50 ug via INTRAVENOUS
  Administered 2023-05-18: 25 ug via INTRAVENOUS
  Administered 2023-05-18: 50 ug via INTRAVENOUS

## 2023-05-18 MED ORDER — DEXTROSE 50 % IV SOLN
1.0000 | Freq: Once | INTRAVENOUS | Status: AC
  Administered 2023-05-19: 50 mL via INTRAVENOUS
  Filled 2023-05-18: qty 50

## 2023-05-18 MED ORDER — LORAZEPAM 2 MG/ML IJ SOLN: 1.0000 mg | Freq: Once | INTRAMUSCULAR | Status: DC

## 2023-05-18 MED ORDER — SODIUM ZIRCONIUM CYCLOSILICATE 5 G PO PACK
10.0000 g | PACK | Freq: Once | ORAL | Status: AC
  Administered 2023-05-19: 10 g
  Filled 2023-05-18: qty 2

## 2023-05-18 MED ORDER — PROPOFOL 10 MG/ML IV BOLUS: INTRAVENOUS | Status: DC | PRN

## 2023-05-18 MED ORDER — FENTANYL CITRATE PF 50 MCG/ML IJ SOSY: 25.0000 ug | PREFILLED_SYRINGE | Freq: Once | INTRAMUSCULAR | Status: DC

## 2023-05-18 MED ORDER — FENTANYL BOLUS VIA INFUSION: 25.0000 ug | INTRAVENOUS | Status: DC | PRN

## 2023-05-18 SURGICAL SUPPLY — 57 items
ADH SKN CLS APL DERMABOND .7 (GAUZE/BANDAGES/DRESSINGS)
APL PRP STRL LF DISP 70% ISPRP (MISCELLANEOUS) ×3
BLADE SAW SAG 25.4X90 (BLADE) ×1 IMPLANT
BLADE SURG SZ10 CARB STEEL (BLADE) ×1 IMPLANT
BNDG CMPR 5X4 CHSV STRCH STRL (GAUZE/BANDAGES/DRESSINGS) ×2
BNDG CMPR STD VLCR NS LF 5.8X6 (GAUZE/BANDAGES/DRESSINGS)
BNDG COHESIVE 4X5 TAN STRL LF (GAUZE/BANDAGES/DRESSINGS) ×1 IMPLANT
BNDG ELASTIC 6X5.8 VLCR NS LF (GAUZE/BANDAGES/DRESSINGS) ×1 IMPLANT
BNDG GAUZE DERMACEA FLUFF 4 (GAUZE/BANDAGES/DRESSINGS) ×1 IMPLANT
BNDG GZE DERMACEA 4 6PLY (GAUZE/BANDAGES/DRESSINGS)
CANISTER WOUND CARE 500ML ATS (WOUND CARE) IMPLANT
CHLORAPREP W/TINT 26 (MISCELLANEOUS) ×1 IMPLANT
DERMABOND ADVANCED .7 DNX12 (GAUZE/BANDAGES/DRESSINGS) ×1 IMPLANT
DRAPE BILAT LIMB 76X120 89291 (MISCELLANEOUS) IMPLANT
DRAPE INCISE IOBAN 66X45 STRL (DRAPES) ×1 IMPLANT
DRSG GAUZE FLUFF 36X18 (GAUZE/BANDAGES/DRESSINGS) ×1 IMPLANT
DRSG VAC GRANUFOAM MED (GAUZE/BANDAGES/DRESSINGS) IMPLANT
ELECT CAUTERY BLADE 6.4 (BLADE) ×1 IMPLANT
ELECT REM PT RETURN 9FT ADLT (ELECTROSURGICAL) ×1
ELECTRODE REM PT RTRN 9FT ADLT (ELECTROSURGICAL) ×1 IMPLANT
GLOVE BIO SURGEON STRL SZ7 (GLOVE) ×1 IMPLANT
GLOVE SURG SYN 8.0 (GLOVE) ×1 IMPLANT
GLOVE SURG SYN 8.0 PF PI (GLOVE) ×1 IMPLANT
GOWN STRL REUS W/ TWL LRG LVL3 (GOWN DISPOSABLE) ×2 IMPLANT
GOWN STRL REUS W/ TWL XL LVL3 (GOWN DISPOSABLE) ×1 IMPLANT
GOWN STRL REUS W/TWL LRG LVL3 (GOWN DISPOSABLE) ×2
GOWN STRL REUS W/TWL XL LVL3 (GOWN DISPOSABLE) ×1
HANDLE YANKAUER SUCT BULB TIP (MISCELLANEOUS) ×1 IMPLANT
KIT TURNOVER KIT A (KITS) ×1 IMPLANT
MANIFOLD NEPTUNE II (INSTRUMENTS) ×1 IMPLANT
MAT ABSORB FLUID 56X50 GRAY (MISCELLANEOUS) ×1 IMPLANT
NDL HYPO 21X1.5 SAFETY (NEEDLE) ×1 IMPLANT
NEEDLE HYPO 21X1.5 SAFETY (NEEDLE) IMPLANT
NS IRRIG 500ML POUR BTL (IV SOLUTION) ×1 IMPLANT
PACK EXTREMITY ARMC (MISCELLANEOUS) ×1 IMPLANT
PAD PREP OB/GYN DISP 24X41 (PERSONAL CARE ITEMS) ×1 IMPLANT
SPONGE T-LAP 18X18 ~~LOC~~+RFID (SPONGE) ×2 IMPLANT
STAPLER SKIN PROX 35W (STAPLE) IMPLANT
STOCKINETTE M/LG 89821 (MISCELLANEOUS) ×1 IMPLANT
SUT ETHIBOND 0 36 GRN (SUTURE) IMPLANT
SUT MNCRL 4-0 (SUTURE)
SUT MNCRL 4-0 27XMFL (SUTURE)
SUT SILK 0 (SUTURE) ×1
SUT SILK 0 30XBRD TIE 6 (SUTURE) IMPLANT
SUT SILK 2 0 (SUTURE) ×1
SUT SILK 2-0 18XBRD TIE 12 (SUTURE) IMPLANT
SUT SILK 3 0 (SUTURE)
SUT SILK 3-0 18XBRD TIE 12 (SUTURE) ×1 IMPLANT
SUT VIC AB 0 CT1 36 (SUTURE) ×6 IMPLANT
SUT VIC AB 3-0 SH 27 (SUTURE)
SUT VIC AB 3-0 SH 27X BRD (SUTURE) ×2 IMPLANT
SUT VICRYL PLUS ABS 0 54 (SUTURE) ×1 IMPLANT
SUTURE MNCRL 4-0 27XMF (SUTURE) ×1 IMPLANT
SYR 20ML LL LF (SYRINGE) ×2 IMPLANT
TAPE UMBILICAL 1/8 X36 TWILL (MISCELLANEOUS) ×1 IMPLANT
TRAP FLUID SMOKE EVACUATOR (MISCELLANEOUS) ×1 IMPLANT
WATER STERILE IRR 500ML POUR (IV SOLUTION) ×1 IMPLANT

## 2023-05-18 NOTE — Progress Notes (Signed)
ANTICOAGULATION CONSULT NOTE  Pharmacy Consult for Heparin Infusion Indication: Ischemic right lower leg  Patient Measurements: Height: 5\' 3"  (160 cm) Weight: 58.6 kg (129 lb 3 oz) IBW/kg (Calculated) : 56.9 Heparin Dosing Weight: 62.6 kg  Labs: Recent Labs    05/16/2023 0830 05/27/2023 0830 05/16/23 0547 05/16/23 0821 05/16/23 1211 05/17/23 0400 05/17/23 1201 05/17/23 2019 05/17/23 2250 05/21/2023 0051 05/28/2023 0427  HGB  --    < > 15.1  --   --  12.9*  --   --   --   --  11.8*  HCT  --   --  46.6  --   --  38.7*  --   --   --   --  35.8*  PLT  --   --  166  --   --  142*  --   --   --   --  132*  APTT 147*  --   --   --   --   --   --   --   --   --   --   HEPARINUNFRC >1.10*  --  0.46  --    < > 0.28* 0.41 0.17*  --   --  0.55  CREATININE  --   --   --  1.04  --  1.64*  --   --   --   --   --   CKTOTAL  --   --   --  10,993*  --   --   --   --   --   --   --   TROPONINIHS  --   --   --   --   --   --   --   --  2,907* 3,542*  --    < > = values in this interval not displayed.   Estimated Creatinine Clearance: 34.2 mL/min (A) (by C-G formula based on SCr of 1.64 mg/dL (H)).  Medical History: Past Medical History:  Diagnosis Date   Asthma    Avascular necrosis of bone of hip (HCC)    Blood clot in vein    Cancer (HCC)    Carotid stenosis, bilateral    Cerebrovascular accident (CVA), unspecified mechanism (HCC)    Coronary artery disease    GERD (gastroesophageal reflux disease)    Heart disease    Hypertension    PAD (peripheral artery disease) (HCC)    Pneumonia    Medications:  PTA Meds: Eliquis 5 mg BID, Baseline HL 0.10  Assessment: Pt is a 67 yo male presenting to ED reporting R leg & foot pain, which began to feel numb and cold.  1104 2019 HL 0.17, subthera; 1100 un/hr  Goal of Therapy:  Heparin level 0.3-0.7 units/ml Monitor platelets by anticoagulation protocol: Yes  Plan:  11/5:  HL @ 0427 = 0.55, therapeutic X 1 - Will continue pt on current  rate and recheck HL in 6 hrs @ 1000.  --Daily CBC per protocol while on IV heparin  Shanon Seawright D 05/16/2023 5:25 AM

## 2023-05-18 NOTE — Significant Event (Signed)
Patient became bradycardic, with prolonged pauses then flipping into a idioventricular rhythm with lows of 21 bpm. - amiodarone & sedation stopped - atropine 1 mg administered with little effect - epinephrine administered, levophed started with stabilization of vitals Patient did NOT lose a pulse during this episode.  12 lead showed an idioventricular block. Discussed case with cardiology on call, patient with overall grave prognosis- agreed with discontinuation of amiodarone at this time.  Attempted to reach sister, no answer and no ability to leave a voicemail. Spoke with daughter [name] and updated her on current clinical status. We discussed overall poor prognosis and that the patient is at HIGH RISK of cardiac arrest. Family will attempt to come bedside and siblings will start to discuss CODE status including DNR status.    Cheryll Cockayne Rust-Chester, AGACNP-BC Acute Care Nurse Practitioner [facility]   [phone] / [phone] Please see Amion for details.

## 2023-05-18 NOTE — Significant Event (Signed)
CARDIOPULMONARY RESUSCITATION Initial rhythm: PEA CPR performance duration: 2 minutes  Medications Administered Include:      Yes/no Amiodarone   Atropine Yes  Calcium Yes- 1 amp  Epinephrine Yes- 1 amp and drip  Lidocaine   Magnesium   Norepinephrine Yes  Phenylephrine   Sodium bicarbonate 1 amp  Vasopression    Evaluation Final Status - Patient unresponsive in ST with levophed and epinephrine drips running on mechanical ventilatory support.  Goals of Care:  The Clinical status was relayed to both daughters, Marolyn Haller & [name] in detail including cardiac arrest and subsequent interventions. Updated and notified of patients medical condition.  Patient with Progressive N-STEMI, HFrEF, PAD with very low chance of meaningful recovery despite all aggressive and optimal medical therapy. Patient is in the Dying  Process associated with Suffering.  Family understands the situation. They would like the patient to remain a FULL CODE. They will discuss with siblings DNR/DNI code status. Family are satisfied with Plan of action and management. All questions answered  Assessment & Plan:  Cardiac Arrest Circulatory shock - CT head once stabilized - Continue Vasopressors: levophed & epinephrine PRN to maintain MAP > 65 or SBP > 100 - continuous cardiac monitoring - STAT labs: CBC, BMP, Mg, Phos, Lactic, PT/ INR, troponin - replace electrolytes PRN - Strict I/O's: alert provider if UOP < 0.5 mL/kg/hr - Cardiology following, appreciate input   Additional CC time 32 mins    Betsey Holiday, AGACNP-BC Acute Care Nurse Practitioner [facility]   [phone] / [phone] Please see Amion for pager details.

## 2023-05-18 NOTE — TOC Progression Note (Signed)
Transition of Care [facility]) - Progression Note    Patient Details  Name: Timothy Galvan MRN: [id number] Date of Birth: [birth year]  [facility] CM/SW Contact  Truddie Hidden, RN Phone Number: 05/26/2023, 11:12 AM  Clinical Narrative:    TOC continuing to follow patient's progress throughout discharge planning.        Expected Discharge Plan and Services                                               Social Determinants of Health (SDOH) Interventions SDOH Screenings   Food Insecurity: No Food Insecurity (06/04/2022)  Housing: Low Risk  (06/04/2022)  Transportation Needs: No Transportation Needs (05/29/2021)   Received from [facility], [facility]  Depression [phone]): Low Risk  (11/27/2022)  Financial Resource Strain: Low Risk  (05/29/2021)   Received from [facility], [facility]  Tobacco Use: High Risk (06/09/2023)    Readmission Risk Interventions    06/02/2022   12:55 PM  Readmission Risk Prevention Plan  Medication Screening Complete  Transportation Screening Complete

## 2023-05-18 NOTE — Progress Notes (Signed)
Pt's right stump with bright red blood draining out around his wound vac site. Pt has 170 ml bright red blood noted in the wound vac canister. Dr. Gilda Crease notified and arrived to pt's bedside. He removed the wound vac and assessed pt's surgical site. He placed sutures to control the bleeding and placed a dry dressing onto right stump. No active bleeding noted at this time.

## 2023-05-18 NOTE — Progress Notes (Signed)
ANTICOAGULATION CONSULT NOTE  Pharmacy Consult for Heparin Infusion Indication: Ischemic right lower leg  Patient Measurements: Height: 5\' 3"  (160 cm) Weight: 58.6 kg (129 lb 3 oz) IBW/kg (Calculated) : 56.9 Heparin Dosing Weight: 62.6 kg  Labs: Recent Labs    05/16/23 0547 05/16/23 0821 05/16/23 1211 05/17/23 0400 05/17/23 1201 05/17/23 2019 05/17/23 2250 05/28/2023 0051 06/12/2023 0427 06/07/2023 0921 05/20/2023 1033  HGB 15.1  --   --  12.9*  --   --   --   --  11.8*  --   --   HCT 46.6  --   --  38.7*  --   --   --   --  35.8*  --   --   PLT 166  --   --  142*  --   --   --   --  132*  --   --   HEPARINUNFRC 0.46  --    < > 0.28*   < > 0.17*  --   --  0.55  --  0.52  CREATININE  --  1.04  --  1.64*  --   --   --   --  1.26*  --   --   CKTOTAL  --  10,993*  --   --   --   --   --   --  31,352*  --   --   TROPONINIHS  --   --   --   --   --   --    < > 3,542* 5,216* 11,231*  --    < > = values in this interval not displayed.   Estimated Creatinine Clearance: 44.5 mL/min (A) (by C-G formula based on SCr of 1.26 mg/dL (H)).  Medical History: Past Medical History:  Diagnosis Date   Asthma    Avascular necrosis of bone of hip (HCC)    Blood clot in vein    Cancer (HCC)    Carotid stenosis, bilateral    Cerebrovascular accident (CVA), unspecified mechanism (HCC)    Coronary artery disease    GERD (gastroesophageal reflux disease)    Heart disease    Hypertension    PAD (peripheral artery disease) (HCC)    Pneumonia    Medications:  PTA Meds: Eliquis 5 mg BID, Baseline HL 0.10  Assessment: Pt is a 67 yo male presenting to ED reporting R leg & foot pain, which began to feel numb and cold.  Labs:  1104 2019 HL 0.17, subtherapeutic  1105 0427 HL 0.55, therapeutic x 1  1105 1033 HL 0.52, therapeutic x 2   Goal of Therapy:  Heparin level 0.3-0.7 units/ml Monitor platelets by anticoagulation protocol: Yes  Plan:  HL therapeutic x 2  Continue heparin gtt at rate of  1300 units/hr  Check next HL with AM labs  Littie Deeds, PharmD Pharmacy Resident  06/03/2023 11:30 AM

## 2023-05-18 NOTE — Transfer of Care (Signed)
Immediate Anesthesia Transfer of Care Note  Patient: Timothy Galvan  Procedure(s) Performed: BILATERAL GUILLOTINE AMPUTATION ABOVE KNEE (Bilateral: Leg Upper) APPLICATION OF WOUND VAC (Bilateral: Leg Upper)  Patient Location: ICU  Anesthesia Type:General  Level of Consciousness: sedated and Patient remains intubated per anesthesia plan  Airway & Oxygen Therapy: Patient remains intubated per anesthesia plan and Patient placed on Ventilator (see vital sign flow sheet for setting)  Post-op Assessment: Report given to RN and Post -op Vital signs reviewed and stable  Post vital signs: Reviewed and stable  Last Vitals:  Vitals Value Taken Time  BP 174/96 1626  Temp 36.3 1626  Pulse 74 1626  Resp 16 05/31/2023 1634  SpO2 96 1626  Vitals shown include unfiled device data.  Last Pain:  Vitals:   05/28/2023 1318  TempSrc:   PainSc: Asleep      Patients Stated Pain Goal: 0 (05/24/2023 0534)  Complications: No notable events documented.

## 2023-05-18 NOTE — Op Note (Signed)
OPERATIVE NOTE   PROCEDURE: Guillotine amputation above the knee right lower extremity. Guillotine amputation above the knee left lower extremity. Insertion of a left IJ central line with ultrasound guidance.  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  On the left however, the muscle bellies are a dusky gray there is one segment in the anterior medial quadrant which is viable but the lateral muscle bellies are nonreactive.  SPECIMEN(S): Bilateral lower extremities to pathology  INDICATIONS:   Timothy Galvan is a 67 y.o. male who presents with acute on chronic ischemia.  He is status post multiple previous reconstructions.  Most recently he underwent a right axillary to femoral bypass with a thrombectomy of the femoral-femoral bypass which was pre-existing.  This reconstruction has not led to limb salvage.  Overall his condition is grave.  The family wished for everything to be done with the hope of saving his life and therefore bilateral guillotine amputations is being undertaken to see if he can recover and then undergo formal closure.  Risk and benefits were reviewed with the family all questions been answered they are in agreement to proceed.  I have also been requested to place a central line given his very poor venous access and this was also consented by the family..  DESCRIPTION: After full informed written consent was obtained from the patient, the patient was brought back to the operating room and placed supine upon the operating table.  Prior to induction, the patient received IV antibiotics.   After obtaining adequate anesthesia, the patient was then prepped and draped in the  standard fashion for a bilateral guillotine amputations above the knee.  Working on the left side first a circumferential incision is made with a 10 blade scalpel.  The dissection is then carried down through the soft tissues opening the fascia with the Bovie cautery.  A segment of muscle in the anterior medial area was red and reactive to the Bovie.  This appeared to be predominantly the sartorius.  However the vast majority of the muscle bellies particularly in the lateral distribution are gray and unreactive to Bovie.  The superficial femoral artery and vein were isolated and then ligated and divided between 0 silk ties.  The femur was then transected with a oscillating saw and the posterior muscle bellies transected with the amputation knife.  Attention was then turned to the right side where in similar fashion a circumferential skin incision was made and then the soft tissues side the muscle bellies all appear to be red and viable and reactive to the Bovie cautery.  The anterior lateral and anterior medial muscle bellies were transected with Bovie cautery.  The superficial femoral artery and vein are isolated between [place] clamps divided and then ligated with 0 silk ties.  The femur is then transected with the oscillating saw and the posterior muscle bellies transected with the amputation knife.  The nerve is isolated with a hemostat and then high ligation with an 0 silk ties performed.  Both limbs were then washed and dried and VAC dressings were placed bilaterally.  2 VAC pumps were utilized.  Attention was then turned to the central line placement.  The left neck was prepped and draped in a sterile fashion.  Ultrasounds placed in a sterile sleeve.  Ultrasound was utilized to identify the internal jugular vein which was echolucent and compressible.  Under direct ultrasound visualization after an image of been recorded the Seldinger needle was inserted into the jugular vein.  J-wire was advanced  without difficulty.  Dilators passed over the wire followed by the central line.  All 3 lm aspirated and flushed easily.  The central line was then secured to the skin in the neck with 2-0 silk and a sterile dressing with a Biopatch was applied.    The patient tolerated this procedure without changes and is hemodynamic stable status which has remained stable  COMPLICATIONS: None  CONDITION: Velna Hatchet [facility]  Office: [phone]   05/21/2023, 4:21 PM

## 2023-05-18 NOTE — Anesthesia Procedure Notes (Signed)
Procedure Name: Intubation Date/Time: 06/06/2023 2:32 PM  Performed by: Velva Harman, RNPre-anesthesia Checklist: Patient identified, Patient being monitored, Timeout performed, Emergency Drugs available and Suction available Patient Re-evaluated:Patient Re-evaluated prior to induction Oxygen Delivery Method: Circle system utilized Preoxygenation: Pre-oxygenation with 100% oxygen Induction Type: IV induction Ventilation: Mask ventilation without difficulty Laryngoscope Size: McGraph and 4 Grade View: Grade I Tube type: Oral Tube size: 7.5 mm Number of attempts: 1 Airway Equipment and Method: Stylet Placement Confirmation: ETT inserted through vocal cords under direct vision, positive ETCO2 and breath sounds checked- equal and bilateral Secured at: 22 cm Tube secured with: Tape Dental Injury: Teeth and Oropharynx as per pre-operative assessment

## 2023-05-18 NOTE — Progress Notes (Signed)
Pt continues to move around in bed and pulls at his gown, IV's, telemetry leads and O2 probe. Unable to get O2 or BP reading consisently. Unable to redirect pt. Dr. Belia Heman is aware. Will continue to monitor.

## 2023-05-18 NOTE — Anesthesia Preprocedure Evaluation (Addendum)
Anesthesia Evaluation  Patient identified by MRN, date of birth, ID band Patient confused    Reviewed: Allergy & Precautions, H&P , NPO status , Patient's Chart, lab work & pertinent test results  History of Anesthesia Complications Negative for: history of anesthetic complications  Airway Mallampati: III  TM Distance: >3 FB Neck ROM: full    Dental  (+) Chipped   Pulmonary neg shortness of breath, asthma , sleep apnea , COPD,  COPD inhaler, neg recent URI, Current Smoker   Pulmonary exam normal        Cardiovascular Exercise Tolerance: Poor hypertension, (-) angina + CAD, + Peripheral Vascular Disease and +CHF  (-) Past MI + dysrhythmias Atrial Fibrillation   Echo 03/01/2023 IMPRESSIONS     1. Left ventricular ejection fraction, by estimation, is 45 to 50%. The  left ventricle has mildly decreased function. The left ventricle  demonstrates global hypokinesis. Left ventricular diastolic parameters are  indeterminate.   2. Right ventricular systolic function is normal. The right ventricular  size is normal.   3. Left atrial size was mild to moderately dilated.   4. Right atrial size was mildly dilated.   5. The mitral valve is normal in structure. Mild mitral valve  regurgitation.   6. The aortic valve is tricuspid. Aortic valve regurgitation is not  visualized. Aortic valve sclerosis is present, with no evidence of aortic  valve stenosis.   7. The inferior vena cava is normal in size with greater than 50%  respiratory variability, suggesting right atrial pressure of 3 mmHg.     Neuro/Psych CVA, Residual Symptoms  negative psych ROS   GI/Hepatic ,GERD  ,,(+)     substance abuse  alcohol use and cocaine use  Endo/Other  negative endocrine ROS    Renal/GU      Musculoskeletal   Abdominal   Peds  Hematology negative hematology ROS (+)   Anesthesia Other Findings Past Medical History: No date: Asthma No  date: Avascular necrosis of bone of hip (HCC) No date: Blood clot in vein No date: Cancer (HCC) No date: Carotid stenosis, bilateral No date: Cerebrovascular accident (CVA), unspecified mechanism (HCC) No date: Coronary artery disease No date: GERD (gastroesophageal reflux disease) No date: Heart disease No date: Hypertension No date: PAD (peripheral artery disease) (HCC) No date: Pneumonia  Past Surgical History: No date: FEMORAL BYPASS No date: MANDIBLE SURGERY No date: WRIST SURGERY  BMI    Body Mass Index: 24.45 kg/m      Reproductive/Obstetrics negative OB ROS                             Anesthesia Physical Anesthesia Plan  ASA: 4  Anesthesia Plan: General ETT   Post-op Pain Management: Ofirmev IV (intra-op)* and Toradol IV (intra-op)*   Induction: Intravenous  PONV Risk Score and Plan: 2 and Ondansetron, Dexamethasone and Treatment may vary due to age or medical condition  Airway Management Planned: Oral ETT  Additional Equipment: Arterial line  Intra-op Plan:   Post-operative Plan: Extubation in OR  Informed Consent: I have reviewed the patients History and Physical, chart, labs and discussed the procedure including the risks, benefits and alternatives for the proposed anesthesia with the patient or authorized representative who has indicated his/her understanding and acceptance.     Dental Advisory Given  Plan Discussed with: Anesthesiologist, CRNA and Surgeon  Anesthesia Plan Comments: (Talked with cardiology given troponin doubled from 5000 to 11000 in 4 hours.  Patient is likely ischemic with ischemia shown on EKG, but they do not plan on doing a cath at this time.  Per their not patient is at high risk (10%) for death during general anesthesia.  I communicated this to the family who are okay proceeding at this time.  Patient's family consented for risks of anesthesia including but not limited to:  - adverse reactions to  medications - damage to eyes, teeth, lips or other oral mucosa - nerve damage due to positioning  - sore throat or hoarseness - Damage to heart, brain, nerves, lungs, other parts of body or loss of life  Patient voiced understanding and assent.)        Anesthesia Quick Evaluation

## 2023-05-18 NOTE — Progress Notes (Signed)
Rounding Note    Patient Name: Timothy Galvan Date of Encounter: 05/25/2023  [facility] Cardiologist: Debbe Odea, MD   Subjective   HS troponin went up to 5000 overnight. Echo ordered. Patient still encephalopathic and unresponsive. Plan for vascular surgery today.   Inpatient Medications    Scheduled Meds:  aspirin EC  81 mg Oral Daily   Chlorhexidine Gluconate Cloth  6 each Topical Daily   empagliflozin  10 mg Oral Daily   folic acid  1 mg Intravenous Daily   hydrocortisone sod succinate (SOLU-CORTEF) inj  50 mg Intravenous Q8H   magnesium oxide  400 mg Oral QHS   multivitamin with minerals  1 tablet Oral Daily   nicotine  21 mg Transdermal Daily   mouth rinse  15 mL Mouth Rinse 4 times per day   pantoprazole (PROTONIX) IV  40 mg Intravenous Daily   thiamine  100 mg Oral Daily   Or   thiamine  100 mg Intravenous Daily   Continuous Infusions:  amiodarone 30 mg/hr (05/29/2023 0600)   dextrose 60 mL/hr at 05/26/2023 0600   heparin 1,300 Units/hr (05/30/2023 0600)   PRN Meds: acetaminophen **OR** acetaminophen, hydrALAZINE, HYDROcodone-acetaminophen, HYDROmorphone (DILAUDID) injection, ipratropium-albuterol, LORazepam, nitroGLYCERIN, ondansetron **OR** ondansetron (ZOFRAN) IV, mouth rinse, traZODone   Vital Signs    Vitals:   05/17/2023 0500 06/04/2023 0600 05/16/2023 0700 06/10/2023 0800  BP: 125/74 95/68 117/69 (!) 154/81  Pulse:    79  Resp: (!) 9 17 14 16   Temp:    98.8 F (37.1 C)  TempSrc:    Axillary  SpO2:    92%  Weight: 58.6 kg     Height:        Intake/Output Summary (Last 24 hours) at 05/20/2023 1036 Last data filed at 05/24/2023 0600 Gross per 24 hour  Intake 1414.79 ml  Output 1225 ml  Net 189.79 ml      06/09/2023    5:00 AM 06/10/2023    4:00 PM 05/17/2023    1:08 AM  Last 3 Weights  Weight (lbs) 129 lb 3 oz 138 lb 7.2 oz 138 lb  Weight (kg) 58.6 kg 62.8 kg 62.596 kg      Telemetry    NSR HR 60-70s - Personally Reviewed  ECG     No new - Personally Reviewed  Physical Exam   GEN: critically ill appearing  Neck: No JVD Cardiac: RRR, no murmurs, rubs, or gallops.  Respiratory: Clear to auscultation bilaterally. GI: Soft, nontender, non-distended  MS: No edema; No deformity. Neuro:  encephalopathic  Psych: Normal affect   Labs    High Sensitivity Troponin:   Recent Labs  Lab 05/17/23 2250 05/20/2023 0051 06/02/2023 0427 05/23/2023 0921  TROPONINIHS 2,907* 3,542* 5,216* 11,231*     Chemistry Recent Labs  Lab 05/16/23 0821 05/17/23 0400 05/17/23 1201 05/20/2023 0427 05/16/2023 0921  NA 136 137  --  135  --   K 4.6 4.9  --  4.4  --   CL 105 104  --  102  --   CO2 20* 22  --  20*  --   GLUCOSE 163* 147* 120* 95 165*  BUN 23 41*  --  38*  --   CREATININE 1.04 1.64*  --  1.26*  --   CALCIUM 9.0 8.2*  --  7.5*  --   MG 2.0 2.4  --  2.4  --   GFRNONAA >60 45*  --  >60  --   ANIONGAP 11  11  --  13  --     Lipids  Recent Labs  Lab 05/16/23 0821  CHOL 193  TRIG 93  HDL 68  LDLCALC 106*  CHOLHDL 2.8    Hematology Recent Labs  Lab 05/16/23 0547 05/17/23 0400 06/09/2023 0427  WBC 14.9* 7.9 8.9  RBC 5.11 4.33 3.93*  HGB 15.1 12.9* 11.8*  HCT 46.6 38.7* 35.8*  MCV 91.2 89.4 91.1  MCH 29.5 29.8 30.0  MCHC 32.4 33.3 33.0  RDW 14.1 14.1 14.3  PLT 166 142* 132*   Thyroid  Recent Labs  Lab 05/16/23 0821  TSH 0.747    BNPNo results for input(s): "BNP", "PROBNP" in the last 168 hours.  DDimer No results for input(s): "DDIMER" in the last 168 hours.   Radiology    No results found.  Cardiac Studies   Echo 02/2023  1. Left ventricular ejection fraction, by estimation, is 45 to 50%. The  left ventricle has mildly decreased function. The left ventricle  demonstrates global hypokinesis. Left ventricular diastolic parameters are  indeterminate.   2. Right ventricular systolic function is normal. The right ventricular  size is normal.   3. Left atrial size was mild to moderately dilated.    4. Right atrial size was mildly dilated.   5. The mitral valve is normal in structure. Mild mitral valve  regurgitation.   6. The aortic valve is tricuspid. Aortic valve regurgitation is not  visualized. Aortic valve sclerosis is present, with no evidence of aortic  valve stenosis.   7. The inferior vena cava is normal in size with greater than 50%  respiratory variability, suggesting right atrial pressure of 3 mmHg.   Patient Profile     67 y.o. 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 in the context of underlying nonobstructive CAD and polysubstance abuse - Echo 02/2023 showed stable mild CM with an EF 45-50%  - he appears euvolemic - Continue Jardiance - continue GDMT as tolerated, BP is limiting this   Elevated troponin Nonobstructive CAD - HS troponin elevated to 5000 overnight - EKG with STE aVL and V1-v3, TWI anteroseptal leads and ST depression inf leads - no chest pain reported given AMS - continue ASA  - echo has been ordered - given multiple acute issues and recommendation for comfort care, no plan for ischemic work-up    PAD - 11/2 OR for axillary-fem bypass and fem-fem bypass embolectomy and fasciotomy - he has been doing poorly post-op - on ASA, Lipitor, IV heparin - plan for bilateral AKA amputation    HTN - BPs labile, soft overnight   Polysubstance  use - UDS +cocaine - prior history of alcohol and tobacco use.  - CIWA per IM  GOC - palliative care is following and comfort care is recommended, but family is resistant to this  Pre-op cardiovascular assessment Overall this is a complex situation. He is critically ill with rhabdomyolysis, critical limb ischemia, severe acute metabolic encephalopathy, Svt/Afib, ETOH withdrawal, and cocaine + with recommendations for comfort measures. He remains in NSR on IV heparin . Troponin elevated overnight and EKG does look ischemic. Echo has been ordered, however even if this is abnormal, he is not a good candidate for invasive cardiac procedures. Patient is high risk for anesthesia. METS<4. According to RCRI he is 10.1% for 30 day risk of death, MI or cardiac arrest. MD to see.   For questions or updates, please contact [facility] Please consult [email or web address] for contact info under        Signed, Lashonna Rieke David Stall, PA-C  05/26/2023, 10:36 AM

## 2023-05-18 NOTE — Progress Notes (Signed)
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  Bilateral stump woundvacs in place with right woundvac draining a moderate amount of serosanguinous drainage and no drainage from the left woundvac.  Pt remained mechanically intubated postop with tentative plans to return to the OR on 11/8.  Current vent settings: PRVC rate 16/TV 450/PEEP 5/FiO2 40%. Propofol and Fentanyl gtts to maintain RASS goal of 0 to -1.  He remains on amiodarone gtt for heart rate control.  Will continue to monitor and assess pt.  Additional Critical Care Time: 30 minutes   Zada Girt, AGNP  Pulmonary/Critical Care Pager [phone] (please enter 7 digits) PCCM Consult Pager [phone] (please enter 7 digits)

## 2023-05-18 NOTE — Progress Notes (Signed)
NAME:  Timothy Galvan, MRN:  [id number], DOB:  April 09, 1956, LOS: 3 ADMISSION DATE:  06/02/2023, CONSULTATION DATE:  05/16/2023 REFERRING MD:  Marcelino Duster, MD, CHIEF COMPLAINT:  SVT   History of Present Illness:   67 year old male with a past medical history of alcohol and drug use who is preseting to the hospital for acute limb ischemia.  Patient was admitted on 06/03/2023 with severe pain in his right leg, noted to have severe hypertension with a mottled limb. 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 There was thrombosis of a fem-fem bypass, R. SFA, and Right anterior tibial and peroneal arteries, with a patent right posterior tibial artery. Given concern for critical limb ischemia, he was started on heparin and vascular surgery was consulted. He was taken to the OR for a right axillary to bifemoral bypass and right calf fasciotomy yesterday. Intraoperative findings suggest that ischemic limb has been ongoing for a while given behavior of the muscle when touched by electrocautery. Patient likely to require a right above knee amputation.  He was extubated in PACU and was admitted to the ICU post operatively for continued monitoring and for management. He was confused overnight. 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 Patient did not revert with 3 doses of adenosine, nor with three attempts at electrical cardioversion. He was subsequently started on IV diltiazem with improvement.  We are called to the bedside due to narrow complex tachycardia, suggestive of AVNRT. 3 doses of adenosine attempted (6 mg followed by 12 mg x2) and 3 attempts at electrical cardioversion attempted, without success. Diltiazem gtt initiated.  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 rate 14, height 5\' 3"  (1.6 m), weight 58.6 kg, SpO2 91%.    FiO2 (%):  [40 %] 40 %   Intake/Output Summary (Last 24 hours) at 05/24/2023 0729 Last data filed at 06/03/2023 0600 Gross per 24 hour  Intake 1414.79 ml  Output 1225 ml  Net 189.79 ml   Filed Weights   06/09/2023 0108 06/04/2023 1600 06/04/2023 0500  Weight: 62.6 kg 62.8 kg 58.6 kg      REVIEW OF SYSTEMS  PATIENT IS UNABLE TO PROVIDE COMPLETE REVIEW OF SYSTEMS DUE TO SEVERE CRITICAL ILLNESS   PHYSICAL EXAMINATION:  GENERAL:critically ill appearing EYES: Pupils equal, round, reactive to light.  No scleral icterus.  MOUTH: Moist mucosal membrane NECK: Supple.  PULMONARY: Lungs clear to auscultation, +rhonchi, +wheezing CARDIOVASCULAR: S1 and S2.  Regular rate and rhythm GASTROINTESTINAL: Soft, nontender, -distended. Positive bowel sounds.  NEUROLOGIC: obtunded SKIN:fasciotomy with wound vac over RT calf, cold extremities  Assessment & Plan:   67 yo AAM with acute critical limb ischemia  s/p revascularization and fasciotomy RT leg, now with left leg ischemia  Will  require right/Left above knee amputation. With unstable arrhythmia with cocaine poisoning complicated by ETOH withdrawal and severe  acute metabolic encephalopathy    NEUROLOGY ACUTE METABOLIC ENCEPHALOPATHY Pain, delirium, ETOH withdrawal High dose thiamine and precedex -IV Morphine for pain -IV lorazepam for CIWA   HIGH RISK FOR INTUBATION AND CARDIAC ARREST -  Intermittent chest x-ray & ABG PRN Oxygen as needed    Cardiovascular-severe PAD, SVT/AVNRT -D/C Losartan -D/C Metoprolol -appreciate input from cardiology and vascular surgery   ACUTE KIDNEY INJURY/Renal Failure -continue Foley Catheter-assess need -Avoid nephrotoxic agents -Follow urine output, BMP -Ensure adequate renal perfusion, optimize oxygenation -Renal dose medications -Metabolic acidosis -Rhabdomyolysis  Intake/Output Summary (Last 24 hours) at 05/23/2023 0732 Last data filed at 06/12/2023 0600 Gross per 24 hour  Intake 1414.79 ml  Output 1225 ml  Net 189.79 ml    ENDO - ICU hypoglycemic\Hyperglycemia protocol -check FSBS per protocol   GI GI PROPHYLAXIS as indicated  NUTRITIONAL STATUS DIET-->NPO Constipation protocol as indicated   ELECTROLYTES -follow labs as needed -replace as needed -pharmacy consultation and following    Best Practice (right click and "Reselect all SmartList Selections" daily)   Diet/type: NPO DVT prophylaxis: systemic heparin GI prophylaxis: PPI Lines: N/A Foley:  N/A Code Status:  full code Last date of multidisciplinary goals of care discussion [05/16/2023]  Labs   CBC: Recent Labs  Lab 05/16/2023 0001 05/16/23 0547 05/17/23 0400 05/22/2023 0427  WBC 9.7 14.9* 7.9 8.9  NEUTROABS 5.8  --   --   --   HGB 15.9 15.1 12.9* 11.8*  HCT 48.2 46.6 38.7* 35.8*  MCV 90.8 91.2 89.4 91.1  PLT 183 166 142* 132*    Basic Metabolic Panel: Recent Labs  Lab 06/09/2023 0001 06/08/2023 2212 05/16/23 0821 05/17/23 0400 05/17/23 1201 05/24/2023 0427  NA 138  --  136 137  --  135  K 4.4  --  4.6 4.9  --  4.4  CL 106  --  105 104  --  102  CO2 22  --  20* 22  --  20*  GLUCOSE 113*  --  163* 147* 120* 95  BUN 20  --  23 41*  --  38*  CREATININE 1.14  --  1.04 1.64*  --  1.26*  CALCIUM 10.1  --  9.0 8.2*  --  7.5*  MG  --  2.1 2.0 2.4  --  2.4  PHOS  --   --  3.7  --   --  3.1   GFR: Estimated Creatinine Clearance: 44.5  mL/min (A) (by C-G formula based on SCr of 1.26 mg/dL (H)). Recent Labs  Lab 05/26/2023 0001 05/16/23 0547 05/16/23 0821 05/16/23 1211 05/17/23 0400 05/17/23 1851 05/22/2023 0427  WBC 9.7 14.9*  --   --  7.9  --  8.9  LATICACIDVEN  --   --  3.6* 7.1*  --  4.6* 5.7*    Liver Function Tests: No results for input(s): "AST", "ALT", "ALKPHOS", "BILITOT", "PROT", "ALBUMIN" in the last 168 hours. No results for input(s): "LIPASE", "AMYLASE" in the last 168 hours. No results for input(s): "AMMONIA" in the last 168 hours.  ABG    Component Value Date/Time   PHART 7.43 06/07/2023 0009   PCO2ART 32 06/10/2023 0009   PO2ART 78 (L) 05/26/2023 0009   HCO3 21.2 06/03/2023 0009   ACIDBASEDEF 2.3 (H) 06/11/2023 0009   O2SAT 97.3 05/27/2023 0009     Coagulation Profile: Recent Labs  Lab 06/01/2023 0001  INR 1.1    Cardiac Enzymes: Recent Labs  Lab 05/16/23 0821 05/30/2023 0427  CKTOTAL 10,993* 31,352*  HbA1C: Hgb A1c MFr Bld  Date/Time Value Ref Range Status  05/16/2023 08:21 AM 6.1 (H) 4.8 - 5.6 % Final    Comment:    (NOTE) Pre diabetes:          5.7%-6.4%  Diabetes:              >6.4%  Glycemic control for   <7.0% adults with diabetes   06/01/2022 02:31 PM 6.3 (H) 4.8 - 5.6 % Final    Comment:    (NOTE)         Prediabetes: 5.7 - 6.4         Diabetes: >6.4         Glycemic control for adults with diabetes: <7.0     CBG: Recent Labs  Lab 06/03/2023 0006 05/16/2023 0035 06/10/2023 0237 05/16/2023 0412 05/30/2023 0651  GLUCAP 61* 175* 80 74 65*      DVT/GI PRX  assessed I Assessed the need for Labs I Assessed the need for Foley I Assessed the need for Central Venous Line Family Discussion when available I Assessed the need for Mobilization I made an Assessment of medications to be adjusted accordingly Safety Risk assessment completed  CASE DISCUSSED IN MULTIDISCIPLINARY ROUNDS WITH ICU TEAM     Critical Care Time devoted to patient care services described  in this note is 55 minutes.  Critical care was necessary to treat /prevent imminent and life-threatening deterioration. Overall, patient is critically ill, prognosis is guarded.  Patient with Multiorgan failure and at high risk for cardiac arrest and death.    Lucie Leather, M.D.  [name]  Medical Director [facility] Medical Director [facility] Cardio-Pulmonary Department

## 2023-05-18 NOTE — Plan of Care (Signed)
  Problem: Education: Goal: Knowledge of General Education information will improve Description: Including pain rating scale, medication(s)/side effects and non-pharmacologic comfort measures Outcome: Progressing   Problem: Health Behavior/Discharge Planning: Goal: Ability to manage health-related needs will improve Outcome: Progressing   Problem: Clinical Measurements: Goal: Ability to maintain clinical measurements within normal limits will improve 06/01/2023 0724 by Gaye Alken, RN Outcome: Progressing 06/03/2023 0722 by Gaye Alken, RN Outcome: Progressing Goal: Will remain free from infection 05/28/2023 0724 by Gaye Alken, RN Outcome: Progressing 06/10/2023 0722 by Gaye Alken, RN Outcome: Progressing Goal: Diagnostic test results will improve 05/22/2023 0724 by Gaye Alken, RN Outcome: Progressing 06/01/2023 0722 by Gaye Alken, RN Outcome: Progressing Goal: Respiratory complications will improve 05/26/2023 0724 by Gaye Alken, RN Outcome: Progressing 05/30/2023 0722 by Gaye Alken, RN Outcome: Progressing Goal: Cardiovascular complication will be avoided 05/16/2023 0724 by Gaye Alken, RN Outcome: Progressing 05/27/2023 0722 by Gaye Alken, RN Outcome: Progressing   Problem: Activity: Goal: Risk for activity intolerance will decrease Outcome: Progressing

## 2023-05-18 NOTE — Progress Notes (Signed)
2125 Noted pt. Heart rate  start dropping down on the 38s went to the bed side check on pt. Place all the sedation and amiodarone on hold and called for  help. Pt. with pulse noted 2129 Code blue was called,and started pls. See code sheet.Staff @ bed side trying to stabilize  pt. 22.27 Heart rate start dropping again and second code was called., provider was getting in touch with family.2312 Family came and [place] NP talked to them for long period of time updated and explained pt. condition .  Will continue to monitor.

## 2023-05-18 NOTE — Progress Notes (Signed)
Patient continues to be moving around in bed, pulling at lines, dressing, linens, and monitors.  Continues to have periods of apnea (Dr. Belia Heman aware). Pulse ox cord changed twice today with probe being changed out and moved from site to site multiple times during the day. Blood sugars are unreliable in extremities and do not correlate with lab results.

## 2023-05-18 NOTE — Progress Notes (Signed)
Progress Note   Patient: Timothy Galvan ZHY:[id number] DOB: [birth year] DOA: 06/02/2023     3 DOS: the patient was seen and examined on 06/09/2023   Brief hospital course: Timothy Galvan is a 67 y.o. male with medical history significant for Asthma, CAD, GERD, HTN, tobacco, EtOH and cocaine use, colon cancer, PAF on eliquis, severe PAD with multiple revascularization, stroke 04/2022, who is being admitted with critical limb ischemia.  Vascular team evaluated him took him to OR. Vascular procedure 06/07/2023: 1) right axilary - right common femoral arterial bypass (6mm ePTFE) 2) thrombectomy right-to-left femoral-femoral bypass 3) right calf four compartment fasciotomies.  He is transferred to ICU for close monitoring. Very confused last night, HR elevated.  11/3: Patient tachycardic 180, got Adenosine 6mg , 12 mg without help. DCCV performed by CCM did not help. Got Cardizem which did hep. Cardiology advised Amiodarone drip. Palliiative and vascular team discussed with family.  11/4: He is restless, does not open eyes. Overnight started precedex drip. He is on amio drip per cardiology. BP lower side. Critical care team discussed with family, he remains full code.  11/5: He is restless, overnight Precedex held due to arrhythmias on tele. Troponin, CK level very high. Family decided to go ahead with b/l AKA. Cardiology saw him for pre- op.  Assessment and Plan: * Critical ischemia of extremity with history of revascularization of same extremity  Extensive PAD Continue heparin infusion as per pharmacy protocol. Vascular Dr. Lenell Galvan performed right axilary - right common femoral arterial bypass, thrombectomy right-to-left femoral-femoral bypass and  right calf four compartment fasciotomies. Continue pain control. NPO for now. Discussed with vascular team, he is at high risk for further interventions. Ongoing discussion with family by palliative team regarding goals of care. Family understands poor  prognosis, decided on Full Code and opted for b/l AKA procedure. Vascular surgery planned for AKA. Post surgery he will be under ICU care.  Acute metabolic and toxic encephalopathy. Alcohol withdrawal, Cocaine abuse Continue CIWA withdrawal protocol. Precedex stopped due to arrhythmias last night. He is restless, does not follow commands. I placed him on Ativan 2 mg Q4 hourly. Continue to monitor neurochecks.  SVT- sustained Paroxysmal Afib Elevated troponin Post vascular intervention 11/3. Adenosine 6mg , 12 mg without help. DCCV performed with no help.  Stopped Cardizem drip, started Amiodarone drip per Cardiology recommendations. Elevated troponin likely demand ischemia in the setting of severe PAD, bilateral lower extremity limb ischemia.  His CK level is also high. Echocardiogram reviewed shows EF 40 to 45%, no further ischemic workup at this time. Continue heparin drip per pharmacy protocol.  AKI: Lactic acidosis- Multifactorial - pre-renal, hypotension, limb ischemia, poor circulation, acute illness. He got aggressive IV hydration with LR. Monitor daily renal function, trend lactic acid. Avoid nephrotoxic drugs. Renally adjust medications.  Hypoglycemia: Fingerstick discrepancy due to poor circulation. BMP sugars stable above 100. Hypoglycemia protocol. D10 infusion ordered.  Hypertension. CAD (coronary artery disease) Chronic systolic and diastolic CHF Home meds- losartan, isosorbide, aspirin and atorvastatin nitroglycerin. BP lower side, discontinue antihypertensives Caution with fluid overload. Avoid betablocker as he is cocaine user.  COPD (chronic obstructive pulmonary disease) (HCC) No acute exacerbation. He is on room air. Supplemental O2 as needed. Continue home inhalers as needed  H/o CVA: Aspirin and statin once awake.    Nursing supportive care. Fall, aspiration precautions. DVT prophylaxis   Code Status: Full Code  Subjective: Patient is seen  and examined today morning. He is awake, but restless. RN giving his meds.  Sister at bedside. Has mittens for safety. HR better, BP improved.  Physical Exam: Vitals:   06/02/2023 1000 06/12/2023 1100 06/10/2023 1200 05/26/2023 1300  BP: 132/82 (!) 142/75  (!) 152/82  Pulse: 80  79   Resp: (!) 21 10  14   Temp:   98.5 F (36.9 C)   TempSrc:   Axillary   SpO2: 94%     Weight:      Height:        General - Elderly African American male, awake but agitated. HEENT - Atraumatic head, non tender sinuses. Lung - Clear, basal rales, rhonchi, no wheezes. Heart - S1, S2 heard, no murmurs, rubs, no pedal edema. Abdomen - Soft, non tender, bowel sounds low. Neuro - open eyes, moves both arms, restless, unable to do full neuro exam. Skin-Right calf vac noted. bilateral groin dressing. Right> left leg cold.   Data Reviewed:      Latest Ref Rng & Units 05/20/2023    4:27 AM 05/17/2023    4:00 AM 05/16/2023    5:47 AM  CBC  WBC 4.0 - 10.5 K/uL 8.9  7.9  14.9   Hemoglobin 13.0 - 17.0 g/dL 09.8  11.9  14.7   Hematocrit 39.0 - 52.0 % 35.8  38.7  46.6   Platelets 150 - 400 K/uL 132  142  166       Latest Ref Rng & Units 06/02/2023   12:14 PM 06/12/2023    9:21 AM 05/17/2023    4:27 AM  BMP  Glucose 70 - 99 mg/dL 829  562  95   BUN 8 - 23 mg/dL   38   Creatinine 1.30 - 1.24 mg/dL   8.65   Sodium 784 - 696 mmol/L   135   Potassium 3.5 - 5.1 mmol/L   4.4   Chloride 98 - 111 mmol/L   102   CO2 22 - 32 mmol/L   20   Calcium 8.9 - 10.3 mg/dL   7.5    ECHOCARDIOGRAM COMPLETE  Result Date: 06/02/2023    ECHOCARDIOGRAM REPORT   Patient Name:   Timothy Galvan Date of Exam: 06/03/2023 Medical Rec #:  [id number]       Height:       63.0 in Accession #:    [id number]      Weight:       129.2 lb Date of Birth:  [birth year]       BSA:          1.606 m Patient Age:    67 years        BP:           154/81 mmHg Patient Gender: M               HR:           77 bpm. Exam Location:  [facility] Procedure: 2D Echo, Cardiac  Doppler and Color Doppler Indications:     Elevated Troponin  History:         Patient has prior history of Echocardiogram examinations, most                  recent 03/01/2023. CAD, PAD, Stroke and COPD, Arrythmias:Atrial                  Fibrillation and Tachycardia, Signs/Symptoms:Chest Pain; Risk                  Factors:Hypertension, Sleep Apnea, Dyslipidemia and Current  Smoker. ETOH use.  Sonographer:     Mikki Harbor Referring Phys:  [id number] BRITTON L RUST-CHESTER Diagnosing Phys: Lorine Bears MD  Sonographer Comments: Technically difficult study due to poor echo windows, suboptimal parasternal window, suboptimal apical window and no subcostal window. Image acquisition challenging due to patient behavioral factors. IMPRESSIONS  1. Left ventricular ejection fraction, by estimation, is 40 to 45%. The left ventricle has mildly decreased function. Left ventricular endocardial border not optimally defined to evaluate regional wall motion. Left ventricular diastolic function could not be evaluated.  2. Right ventricular systolic function is normal. The right ventricular size is normal. Tricuspid regurgitation signal is inadequate for assessing PA pressure.  3. The mitral valve is normal in structure. Mild mitral valve regurgitation. No evidence of mitral stenosis.  4. The aortic valve is normal in structure. Aortic valve regurgitation is not visualized. Aortic valve sclerosis/calcification is present, without any evidence of aortic stenosis.  5. Technically difficult study due to poor echo windows, suboptimal parasternal window, suboptimal apical window and no subcostal window. Image acquisition challenging due to patient behavioral factors. FINDINGS  Left Ventricle: Left ventricular ejection fraction, by estimation, is 40 to 45%. The left ventricle has mildly decreased function. Left ventricular endocardial border not optimally defined to evaluate regional wall motion. The left ventricular  internal cavity size was normal in size. There is borderline left ventricular hypertrophy. Left ventricular diastolic function could not be evaluated. Right Ventricle: The right ventricular size is normal. No increase in right ventricular wall thickness. Right ventricular systolic function is normal. Tricuspid regurgitation signal is inadequate for assessing PA pressure. Left Atrium: Left atrial size was normal in size. Right Atrium: Right atrial size was normal in size. Pericardium: There is no evidence of pericardial effusion. Mitral Valve: The mitral valve is normal in structure. Mild mitral annular calcification. Mild mitral valve regurgitation. No evidence of mitral valve stenosis. Tricuspid Valve: The tricuspid valve is normal in structure. Tricuspid valve regurgitation is not demonstrated. No evidence of tricuspid stenosis. Aortic Valve: The aortic valve is normal in structure. Aortic valve regurgitation is not visualized. Aortic valve sclerosis/calcification is present, without any evidence of aortic stenosis. Pulmonic Valve: The pulmonic valve was normal in structure. Pulmonic valve regurgitation is not visualized. No evidence of pulmonic stenosis. Aorta: The aortic root is normal in size and structure. Venous: The inferior vena cava was not well visualized. IAS/Shunts: No atrial level shunt detected by color flow Doppler.  LEFT VENTRICLE PLAX 2D LVIDd:         5.20 cm LVIDs:         4.70 cm LV PW:         1.00 cm LV IVS:        1.10 cm LVOT diam:     2.20 cm LVOT Area:     3.80 cm  LEFT ATRIUM         Index LA diam:    3.50 cm 2.18 cm/m   AORTA Ao Root diam: 3.20 cm  SHUNTS Systemic Diam: 2.20 cm Lorine Bears MD Electronically signed by Lorine Bears MD Signature Date/Time: 06/07/2023/1:31:03 PM    Final      Family Communication: Discussed with family regarding his poor prognosis. Continued goals of care discussion with family.  Disposition: Status is: Inpatient Remains inpatient appropriate  because: critically ill, need ICU post vascular procedure, AKA.  Planned Discharge Destination: unknown at this time as prognosis poor.     MDM level 3- Patient is post op vascular procedure  for limb ischmeia, mental status poor, on amio, heparin drip with low BP. He is critically ill and will need b/l AKA, ICU close hemodynamic, telemetry monitoring. Overall prognosis poor.  Author: Marcelino Duster, MD 05/29/2023 3:32 PM Secure chat 7am to 7pm For on call review [email or web address].

## 2023-05-18 NOTE — Plan of Care (Signed)

## 2023-05-18 NOTE — Progress Notes (Signed)
*  PRELIMINARY RESULTS* Echocardiogram 2D Echocardiogram has been performed.  Timothy Galvan 06/08/2023, 11:43 AM

## 2023-05-19 ENCOUNTER — Encounter: Payer: Self-pay | Admitting: Vascular Surgery

## 2023-05-19 DIAGNOSIS — I998 Other disorder of circulatory system: Secondary | ICD-10-CM | POA: Diagnosis not present

## 2023-05-19 DIAGNOSIS — M87052 Idiopathic aseptic necrosis of left femur: Secondary | ICD-10-CM

## 2023-05-19 DIAGNOSIS — I214 Non-ST elevation (NSTEMI) myocardial infarction: Secondary | ICD-10-CM

## 2023-05-19 DIAGNOSIS — I469 Cardiac arrest, cause unspecified: Secondary | ICD-10-CM | POA: Diagnosis not present

## 2023-05-19 DIAGNOSIS — F1090 Alcohol use, unspecified, uncomplicated: Secondary | ICD-10-CM | POA: Diagnosis not present

## 2023-05-19 LAB — CBC
HCT: 29.1 % — ABNORMAL LOW (ref 39.0–52.0)
Hemoglobin: 8.7 g/dL — ABNORMAL LOW (ref 13.0–17.0)
MCH: 29.6 pg (ref 26.0–34.0)
MCHC: 29.9 g/dL — ABNORMAL LOW (ref 30.0–36.0)
MCV: 99 fL (ref 80.0–100.0)
Platelets: 85 10*3/uL — ABNORMAL LOW (ref 150–400)
RBC: 2.94 MIL/uL — ABNORMAL LOW (ref 4.22–5.81)
RDW: 14.6 % (ref 11.5–15.5)
WBC: 6.1 10*3/uL (ref 4.0–10.5)
nRBC: 22.2 % — ABNORMAL HIGH (ref 0.0–0.2)

## 2023-05-19 LAB — HEPATIC FUNCTION PANEL
ALT: 7751 U/L — ABNORMAL HIGH (ref 0–44)
AST: 12615 U/L — ABNORMAL HIGH (ref 15–41)
Albumin: 1.9 g/dL — ABNORMAL LOW (ref 3.5–5.0)
Alkaline Phosphatase: 204 U/L — ABNORMAL HIGH (ref 38–126)
Bilirubin, Direct: 1.3 mg/dL — ABNORMAL HIGH (ref 0.0–0.2)
Indirect Bilirubin: 1 mg/dL — ABNORMAL HIGH (ref 0.3–0.9)
Total Bilirubin: 2.3 mg/dL — ABNORMAL HIGH (ref <1.2)
Total Protein: 4.4 g/dL — ABNORMAL LOW (ref 6.5–8.1)

## 2023-05-19 LAB — BLOOD GAS, VENOUS
Acid-base deficit: 20.1 mmol/L — ABNORMAL HIGH (ref 0.0–2.0)
Acid-base deficit: 13.4 mmol/L — ABNORMAL HIGH (ref 0.0–2.0)
Bicarbonate: 11.9 mmol/L — ABNORMAL LOW (ref 20.0–28.0)
Bicarbonate: 14.2 mmol/L — ABNORMAL LOW (ref 20.0–28.0)
FIO2: 40 %
MECHVT: 450 mL
Mechanical Rate: 16
O2 Saturation: 63 %
O2 Saturation: 55.3 %
PEEP: 5 cmH2O
Patient temperature: 37
Patient temperature: 37
pCO2, Ven: 53 mm[Hg] (ref 44–60)
pCO2, Ven: 38 mm[Hg] — ABNORMAL LOW (ref 44–60)
pH, Ven: 6.96 — CL (ref 7.25–7.43)
pH, Ven: 7.18 — CL (ref 7.25–7.43)
pO2, Ven: 49 mm[Hg] — ABNORMAL HIGH (ref 32–45)
pO2, Ven: 45 mm[Hg] (ref 32–45)

## 2023-05-19 LAB — BASIC METABOLIC PANEL
Anion gap: 20 — ABNORMAL HIGH (ref 5–15)
Anion gap: 28 — ABNORMAL HIGH (ref 5–15)
BUN: 40 mg/dL — ABNORMAL HIGH (ref 8–23)
BUN: 42 mg/dL — ABNORMAL HIGH (ref 8–23)
CO2: 14 mmol/L — ABNORMAL LOW (ref 22–32)
CO2: 14 mmol/L — ABNORMAL LOW (ref 22–32)
Calcium: 9.8 mg/dL (ref 8.9–10.3)
Calcium: 7.7 mg/dL — ABNORMAL LOW (ref 8.9–10.3)
Chloride: 102 mmol/L (ref 98–111)
Chloride: 98 mmol/L (ref 98–111)
Creatinine, Ser: 2.51 mg/dL — ABNORMAL HIGH (ref 0.61–1.24)
Creatinine, Ser: 2.96 mg/dL — ABNORMAL HIGH (ref 0.61–1.24)
GFR, Estimated: 27 mL/min — ABNORMAL LOW (ref >60)
GFR, Estimated: 22 mL/min — ABNORMAL LOW (ref >60)
Glucose, Bld: 160 mg/dL — ABNORMAL HIGH (ref 70–99)
Glucose, Bld: 214 mg/dL — ABNORMAL HIGH (ref 70–99)
Potassium: 6.5 mmol/L (ref 3.5–5.1)
Potassium: 5.4 mmol/L — ABNORMAL HIGH (ref 3.5–5.1)
Sodium: 136 mmol/L (ref 135–145)
Sodium: 140 mmol/L (ref 135–145)

## 2023-05-19 LAB — COOXEMETRY PANEL
Carboxyhemoglobin: 3.1 % — ABNORMAL HIGH (ref 0.5–1.5)
Methemoglobin: 1.2 % (ref 0.0–1.5)
O2 Saturation: 56.8 %
Total hemoglobin: 8.2 g/dL — ABNORMAL LOW (ref 12.0–16.0)
Total oxygen content: 54.4 %

## 2023-05-19 LAB — MAGNESIUM
Magnesium: 3.7 mg/dL — ABNORMAL HIGH (ref 1.7–2.4)
Magnesium: 3.7 mg/dL — ABNORMAL HIGH (ref 1.7–2.4)

## 2023-05-19 LAB — GLUCOSE, CAPILLARY
Glucose-Capillary: 203 mg/dL — ABNORMAL HIGH (ref 70–99)
Glucose-Capillary: 275 mg/dL — ABNORMAL HIGH (ref 70–99)
Glucose-Capillary: 390 mg/dL — ABNORMAL HIGH (ref 70–99)
Glucose-Capillary: 91 mg/dL (ref 70–99)

## 2023-05-19 LAB — TRIGLYCERIDES: Triglycerides: 99 mg/dL (ref <150)

## 2023-05-19 LAB — PHOSPHORUS: Phosphorus: 15.3 mg/dL — ABNORMAL HIGH (ref 2.5–4.6)

## 2023-05-19 LAB — LACTIC ACID, PLASMA: Lactic Acid, Venous: >9 mmol/L (ref 0.5–1.9)

## 2023-05-19 MED ORDER — VASOPRESSIN 20 UNITS/100 ML INFUSION FOR SHOCK
0.0000 [IU]/min | INTRAVENOUS | Status: DC
  Administered 2023-05-19: 0.03 [IU]/min via INTRAVENOUS
  Administered 2023-05-19: 0.04 [IU]/min via INTRAVENOUS
  Filled 2023-05-19 (×2): qty 100

## 2023-05-19 MED ORDER — DOCUSATE SODIUM 50 MG/5ML PO LIQD: 100.0000 mg | Freq: Two times a day (BID) | ORAL | Status: DC

## 2023-05-19 MED ORDER — ACETAMINOPHEN 650 MG RE SUPP: 650.0000 mg | Freq: Four times a day (QID) | RECTAL | Status: DC | PRN

## 2023-05-19 MED ORDER — HYDROCODONE-ACETAMINOPHEN 5-325 MG PO TABS: 1.0000 | ORAL_TABLET | ORAL | Status: DC | PRN

## 2023-05-19 MED ORDER — ASPIRIN 81 MG PO CHEW
81.0000 mg | CHEWABLE_TABLET | Freq: Every day | ORAL | Status: DC
  Administered 2023-05-19: 81 mg via ORAL
  Filled 2023-05-19: qty 1

## 2023-05-19 MED ORDER — POLYETHYLENE GLYCOL 3350 17 G PO PACK: 17.0000 g | PACK | Freq: Every day | ORAL | Status: DC

## 2023-05-19 MED ORDER — STERILE WATER FOR INJECTION IV SOLN
INTRAVENOUS | Status: DC
  Filled 2023-05-19: qty 150
  Filled 2023-05-19: qty 1000

## 2023-05-19 MED ORDER — SODIUM BICARBONATE 8.4 % IV SOLN
50.0000 meq | Freq: Once | INTRAVENOUS | Status: DC
  Filled 2023-05-19: qty 50

## 2023-05-19 MED ORDER — INSULIN ASPART 100 UNIT/ML IJ SOLN
0.0000 [IU] | INTRAMUSCULAR | Status: DC
  Administered 2023-05-19: 2 [IU] via SUBCUTANEOUS
  Filled 2023-05-19: qty 1

## 2023-05-19 MED ORDER — SODIUM BICARBONATE 8.4 % IV SOLN
100.0000 meq | Freq: Once | INTRAVENOUS | Status: AC
  Administered 2023-05-19: 100 meq via INTRAVENOUS
  Filled 2023-05-19: qty 100

## 2023-05-19 MED ORDER — ACETAMINOPHEN 325 MG PO TABS: 650.0000 mg | ORAL_TABLET | Freq: Four times a day (QID) | ORAL | Status: DC | PRN

## 2023-05-20 LAB — SURGICAL PATHOLOGY

## 2023-05-25 ENCOUNTER — Ambulatory Visit: Payer: Medicare (Managed Care) | Admitting: Medical

## 2023-06-13 NOTE — Progress Notes (Signed)
Responded to [employer] paged at 10:30pm-no family present and no current needs.   Paged again at 1:30am as family (3 daughters of pt) at bedside, made decision to move their dad to DNR. Introduced spiritual care services and offered compassionate presence-denied visit at this time-check in later may be good.

## 2023-06-13 NOTE — Plan of Care (Signed)
  Problem: Clinical Measurements: Goal: Diagnostic test results will improve Outcome: Not Progressing Goal: Respiratory complications will improve Outcome: Not Progressing

## 2023-06-13 NOTE — Progress Notes (Signed)
Unable to palpate or doppler right radial, left radial, and left femoral pulse.  This nurse able to doppler right brachial, left brachial, and right femoral pulse. Marlowe Aschoff, NP notified. No new orders obtained.

## 2023-06-13 NOTE — Progress Notes (Signed)
Nutrition Follow Up Notes   DOCUMENTATION CODES:   Not applicable  INTERVENTION:   If tube feeds initiated, recommend:  Vital 1.2@50ml /hr- Initiate at 71ml/hr and increase by 28ml/hr q 8 hours until goal rate is reached.   Free water flushes 30ml q4 hours to maintain tube patency   Regimen provides 1440kcal/day, 90g/day of protein and 119ml/day of free water.   Juven Fruit Punch BID via tube, each serving provides 95kcal and 2.5g of protein (amino acids glutamine and arginine)  Pt at high refeed risk; recommend monitor potassium, magnesium and phosphorus labs daily until stable  Daily weights   NUTRITION DIAGNOSIS:   Inadequate oral intake related to inability to eat (pt with AMS) as evidenced by NPO status. -ongoing   GOAL:   Provide needs based on ASPEN/SCCM guidelines -not met   MONITOR:   Vent status, Labs, Weight trends, Skin, I & O's  ASSESSMENT:   67 y/o male with h/o CAD, PAD, COPD, HTN, CVA, etoh and cocaine abuse, PAF, GERD, HLD, colon cancer, OSA and SVT who is admitted with PAF, SVT, AMS and critical leg ischemia s/p right femoral arterial bypass, thrombectomy, right-to-left femoral-femoral bypass and right calf four compartment fasciotomies 11/2 complicated by AKI and rhabdomyolysis.  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 OGT in place. Pt is too unstable for nutrition support today. Pt with increasing pressor requirements and worsening lactic acid. Pt with worsening AKI. Will plan to initiate tube feeds if pt becomes stable enough to safely feed. Pt is at high refeed risk as pt has remained NPO since admission and is now without adequate nutrition for 5 days. Per chart, pt is down ~12lbs since admission.    Medications reviewed and include: aspirin, colace, folic acid, solu-medrol, insulin, nicotine, protonix, miralax, Na bicarbonate, thiamine, epinephrine, levophed, vasopressin   Labs reviewed: K  5.4(H), BUN 42(H), creat 2.96(H), P 15.3(H), Mg 3.7(H), AST 12,615(H), ALT 7751(H), tbili 2.3(H) Lactic acid- >9.0(H) Hgb 8.7(L), Hct 29.1(L) Cbgs- 91, 203, 275, 390 x 24 hrs  Patient is currently intubated on ventilator support MV: 9.5 L/min Temp (24hrs), Avg:97.4 F (36.3 C), Min:96.2 F (35.7 C), Max:98.5 F (36.9 C)  Propofol: none   MAP- <   UOP-   Drains-   Diet Order:   Diet Order     None      EDUCATION NEEDS:   Not appropriate for education at this time  Skin:  Skin Assessment: Reviewed RN Assessment (incision chest and thighs)  Last BM:  PTA  Height:   Ht Readings from Last 1 Encounters:  06/08/2023 5\' 3"  (1.6 m)    Weight:   Wt Readings from Last 1 Encounters:  06/07/2023 57.3 kg    Ideal Body Weight:  56.3 kg  BMI:  Body mass index is 22.38 kg/m.  Estimated Nutritional Needs:   Kcal:  1433kcal/day  Protein:  85-100g/day  Fluid:  1.5-1.7L/day  Betsey Holiday MS, RD, LDN Please refer to [facility] for RD and/or RD on-call/weekend/after hours pager

## 2023-06-13 NOTE — Progress Notes (Signed)
NAME:  Timothy Galvan, MRN:  [id number], DOB:  1955-12-17, LOS: 4 ADMISSION DATE:  05/26/2023, CONSULTATION DATE:  05/16/2023 REFERRING MD:  Marcelino Duster, MD, CHIEF COMPLAINT:  SVT   History of Present Illness:   67 year old male with a past medical history of alcohol and drug use who is preseting to the hospital for acute limb ischemia.  Patient was admitted on 05/26/2023 with severe pain in his right leg, noted to have severe hypertension with a mottled limb. 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 There was thrombosis of a fem-fem bypass, R. SFA, and Right anterior tibial and peroneal arteries, with a patent right posterior tibial artery. Given concern for critical limb ischemia, he was started on heparin and vascular surgery was consulted. He was taken to the OR for a right axillary to bifemoral bypass and right calf fasciotomy 05/31/2023  Intraoperative findings suggest that ischemic limb has been ongoing for a while given behavior of the muscle when touched by electrocautery.   He was extubated in PACU and was admitted to the ICU post operatively for continued monitoring and for management. He was confused overnight. 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 Patient did not revert with 3 doses of adenosine, nor with three attempts at electrical cardioversion. He was subsequently started on IV diltiazem with improvement.  We are called to the bedside due to narrow complex tachycardia, suggestive of AVNRT. 3 doses of adenosine attempted (6 mg followed by 12 mg x2) and 3 attempts at electrical cardioversion attempted, without success. Diltiazem gtt initiated.  11/5 The patient was taken to the OR for bilateral guillotine amputations above the knee with central line placement. Overnight the patient became  bradycardic and pulses became absent and a subsequent code blue was called at 10:30 pm. The patient was resuscitated shortly after and family came to bedside where goals of care were discussed and the family decided to make the patient DNR.  Pertinent  Medical History   Asthma PAD s/p fem-fem bypass CVA CAD HTN EtOH abuse Cocaine Abuse  Significant Hospital Events: Including procedures, antibiotic start and stop dates in addition to other pertinent events   05/29/2023: admission, axillary to bifemoral bypass & fasciotomy 05/16/2023: SVT, on diltiazem gtt 11/4 on AMIO, HEP, increased WOB 05/16/2023: The patient was taken to the OR for bilateral above the knee amputation secondary to limb ischemia and gangrene. That patient returned from the OR intubated. Overnight the patient became bradycardiac but had ROSC shortly after. The family decided to make the patient DNR. 06/03/2023: The patient continues to decline, requiring mechanical ventilation, multiple medications for BP support and rhythm control.   Interim History / Subjective:  Remains critically ill and requires mechanical ventilation and pressure support + Severe RHABDO + Bilateral leg wounds, one with wound vac and one with pressure dressing + Vasopressors  + AKI + A-fib requiring rhythm control with amiodarone  + Intubated  Patient with severe myonecrosis dead tissue in the left stump extending into hip  Objective   Blood pressure (!) 146/99, pulse (!) 27, temperature (!) 96.9 F (36.1 C), temperature source Axillary, resp. rate (!) 21, height 5\' 3"  (1.6 m), weight 57.3 kg, SpO2 (!) 39%.    Vent Mode: PRVC FiO2 (%):  [40 %] 40 % Set Rate:  [16 bmp-20 bmp] 20 bmp Vt Set:  [450  mL] 450 mL PEEP:  [5 cmH20] 5 cmH20 Plateau Pressure:  [12 cmH20-14 cmH20] 14 cmH20   Intake/Output Summary (Last 24 hours) at 06/11/2023 0813 Last data filed at 06/09/2023 0600 Gross per 24 hour  Intake 2127.3 ml  Output 1320 ml  Net 807.3 ml    Filed Weights   05/28/2023 1600 06/11/2023 0500 06/08/2023 0700  Weight: 62.8 kg 58.6 kg 57.3 kg      REVIEW OF SYSTEMS  PATIENT IS UNABLE TO PROVIDE COMPLETE REVIEW OF SYSTEMS DUE TO SEVERE CRITICAL ILLNESS   PHYSICAL EXAMINATION:  GENERAL:critically ill appearing, on mechanical ventilation EYES: Pupils minimally reactive to light.  No scleral icterus.  MOUTH: Moist mucosal membrane, INTUBATED NECK: Supple.  PULMONARY: Lungs clear to auscultation, +rhonchi, +wheezing CARDIOVASCULAR: S1 and S2.  Regular rate and rhythm GASTROINTESTINAL: Soft, nontender, -distended. Positive bowel sounds.  NEUROLOGIC: obtunded, non-responsive SKIN/EXTREMITIES: wound vac on left leg wound, pressure pressing in place on right leg wound. Difficulty palpating bilateral brachial/radial arteries, minimal on doppler. Bilateral femoral pulses heard on doppler.   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   Bilateral Leg ischemia and gangrene requiring above the knee amputation - Following vascular surgery recommendations - Monitor wounds and wound vac output, keep dressings clean, dry and intact   NEUROLOGY ACUTE METABOLIC ENCEPHALOPATHY Substance use disorder and ETOH withdrawal - PRN Fentanyl for pain - Continue thiamine   CARDIAC Cardiac arrest (NOW DNR STATUS) A-fib with RVR Shock secondary to lactic acidosis - ICU cardiac monitoring  - Continue amiodarone for rhythm control - Continue levo, vasopressin, epi and bicarb wean as tolerated  - PRN VBG/ABG  - Daily Lactic acid until acidosis has resolved - Trend cardiac enzymes   PULM Respiratory failure requiring mechanical ventilation - Continue mechanical  ventilation until tentative surgery 11/8 - RASS goal -1 to 0 - VAP protocol - PAD protocol, PRN dilaudid, Ativan PRN for aggitation - Wean ventilator setting as tolerated - Bronchodilators PRN - Chest XR as needed   Vent Mode: PRVC FiO2 (%):  [40 %] 40 % Set Rate:  [16 bmp-20 bmp] 20 bmp Vt Set:  [450 mL] 450 mL PEEP:  [5 cmH20] 5 cmH20 Plateau Pressure:  [12 cmH20-14 cmH20] 14 cmH20   RENAL ACUTE KIDNEY INJURY/Renal Failure Severe Rhabdomyolysis  - Strict I&O - Daily BMP for electrolyte monitoring - Replace as needed - Worsening kidney function, consult nephrology - Monitor CK when indicated  - Continue bicarb  Intake/Output Summary (Last 24 hours) at 06/12/2023 0813 Last data filed at 06/11/2023 0600 Gross per 24 hour  Intake 2127.3 ml  Output 1320 ml  Net 807.3 ml    ENDO - Continue stress dose steroids  - SSI ggt hypo/hyperglycemia protocol    GI Acute liver failure due to shock -GI prophylaxis  Hypothermia - Unable to get temp, continue Bair hugger   HEME Anemia  Thrombocytopenia  - Daily CBC - Transfuse in HBG < 7 - Consider platelet transfusion, DC heparin  The patient remains in critical condition with multiorgan failure and continues to decline. The patient prognosis is grim and he is unlikely going to survive despite aggressive efforts. Goal of care have been discussed with the family and a DNR order has been placed and  palliative care has been consulted. Patient is suffering and in the dying process Best Practice (right click and "Reselect all SmartList Selections" daily)   Diet/type: NPO GI prophylaxis: PPI Code Status:  DNR Last date of multidisciplinary goals of care discussion [06/10/2023]  Labs   CBC: Recent Labs  Lab 06/03/2023 0001 05/16/23 0547 05/17/23 0400 05/29/2023 0427 06/10/2023 1715 06/09/2023 2222 06/09/2023 0446  WBC 9.7   < > 7.9 8.9 9.5 8.8 6.1  NEUTROABS 5.8  --   --   --  6.7  --   --   HGB 15.9   < > 12.9* 11.8* 10.6*  9.1* 8.7*  HCT 48.2   < > 38.7* 35.8* 32.5* 30.9* 29.1*  MCV 90.8   < > 89.4 91.1 91.0 101.3* 99.0  PLT 183   < > 142* 132* 164 150 85*   < > = values in this interval not displayed.    Basic Metabolic Panel: Recent Labs  Lab 05/16/23 0821 05/17/23 0400 05/17/23 1201 05/20/2023 0427 06/09/2023 0921 05/28/2023 1715 06/07/2023 2005 06/01/2023 2222 06/08/2023 2310 06/09/2023 0446  NA 136 137  --  135  --   --   --  134* 136 140  K 4.6 4.9  --  4.4  --   --   --  7.4* 6.5* 5.4*  CL 105 104  --  102  --   --   --  104 102 98  CO2 20* 22  --  20*  --   --   --  9* 14* 14*  GLUCOSE 163* 147*   < > 95   < > 157* 193* 154* 160* 214*  BUN 23 41*  --  38*  --   --   --  38* 40* 42*  CREATININE 1.04 1.64*  --  1.26*  --   --   --  2.46* 2.51* 2.96*  CALCIUM 9.0 8.2*  --  7.5*  --   --   --  7.2* 9.8 7.7*  MG 2.0 2.4  --  2.4  --   --   --   --  3.7* 3.7*  PHOS 3.7  --   --  3.1  --   --   --   --   --  15.3*   < > = values in this interval not displayed.   GFR: Estimated Creatinine Clearance: 19 mL/min (A) (by C-G formula based on SCr of 2.96 mg/dL (H)). Recent Labs  Lab 05/17/23 1851 05/26/2023 0427 05/22/2023 1715 05/31/2023 2222 06/03/2023 0446  WBC  --  8.9 9.5 8.8 6.1  LATICACIDVEN 4.6* 5.7*  --  >9.0* >9.0*    Liver Function Tests: Recent Labs  Lab 06/04/2023 0446  AST 12,615*  ALT 7,751*  ALKPHOS 204*  BILITOT 2.3*  PROT 4.4*  ALBUMIN 1.9*   No results for input(s): "LIPASE", "AMYLASE" in the last 168 hours. No results for input(s): "AMMONIA" in the last 168 hours.  ABG    Component Value Date/Time   PHART 7.27 (L) 06/07/2023 1744   PCO2ART 36 06/09/2023 1744   PO2ART 68 (L) 06/11/2023 1744   HCO3 11.9 (L) 06/12/2023 0446   ACIDBASEDEF 20.1 (H) 06/11/2023 0446   O2SAT 63 06/12/2023 0446   O2SAT 56.8 05/30/2023 0446     Coagulation Profile: Recent Labs  Lab 05/30/2023 0001 05/16/2023 1808  INR 1.1 1.2    Cardiac Enzymes: Recent Labs  Lab 05/16/23 0821 06/01/2023 0427   CKTOTAL 10,993* 31,352*  HbA1C: Hgb A1c MFr Bld  Date/Time Value Ref Range Status  05/16/2023 08:21 AM 6.1 (H) 4.8 - 5.6 % Final    Comment:    (NOTE) Pre diabetes:          5.7%-6.4%  Diabetes:              >6.4%  Glycemic control for   <7.0% adults with diabetes   06/01/2022 02:31 PM 6.3 (H) 4.8 - 5.6 % Final    Comment:    (NOTE)         Prediabetes: 5.7 - 6.4         Diabetes: >6.4         Glycemic control for adults with diabetes: <7.0     CBG: Recent Labs  Lab 05/28/2023 0809 06/01/2023 0056 06/10/2023 0206 05/16/2023 0432 05/22/2023 0802  GLUCAP 35* 390* 275* 203* 91      DVT/GI PRX  assessed I Assessed the need for Labs I Assessed the need for Foley I Assessed the need for Central Venous Line Family Discussion when available I Assessed the need for Mobilization I made an Assessment of medications to be adjusted accordingly Safety Risk assessment completed  CASE DISCUSSED IN MULTIDISCIPLINARY ROUNDS WITH ICU TEAM     Critical Care Time devoted to patient care services described in this note is 55 minutes.  Critical care was necessary to treat /prevent imminent and life-threatening deterioration. Overall, patient is critically ill, prognosis is guarded.  Patient with Multiorgan failure and at high risk for cardiac arrest and death.    Lucie Leather, M.D.  [name]  Medical Director [facility] Medical Director [facility] Cardio-Pulmonary Department

## 2023-06-13 NOTE — Death Summary Note (Signed)
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 Shock: Septic and Cardiogenic Severe Sepsis  Atrial fibrillation with RVR NSTEMI In-hospital Cardiac arrest Acute Kidney Injury Rhabdomyolysis Anion Gap Metabolic Acidosis Lactic Acidosis Shock liver Acute Metabolic Encephalopathy  Diagnoses  Preliminary cause of death:  Acute Critical limb ischemia with gangrene Secondary Diagnoses (including complications and co-morbidities):  Principal Problem:   Ischemic leg Active Problems:   CAD (coronary artery disease)   PAD (peripheral artery disease) (HCC)   COPD (chronic obstructive pulmonary disease) (HCC)   HTN (hypertension)   Avascular necrosis of bone of hip (HCC)   History of CVA (cerebrovascular accident)   Tobacco use disorder   Alcohol use disorder   Acute lower limb ischemia   Paroxysmal A-fib (HCC)   Sustained SVT (HCC)   Cardiac arrest, cause unspecified (HCC)   Non-ST elevation (NSTEMI) myocardial infarction [facility])   Brief Hospital Course (including significant findings, care, treatment, and services provided and events leading to death)  Timothy Galvan is a 67 y.o. year old male  with a past medical history of alcohol and drug use who is preseting to the hospital for acute limb ischemia.   Patient was admitted on 05/22/2023 with severe pain in his right leg, noted to have severe hypertension with a mottled limb. 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 There was  thrombosis of a fem-fem bypass, R. SFA, and Right anterior tibial and peroneal arteries, with a patent right posterior tibial artery. Given concern for critical limb ischemia, he was started on heparin and vascular surgery was consulted. He was taken to the OR for a right axillary to bifemoral bypass and right calf fasciotomy 11-Jun-2023  Intraoperative findings suggest that ischemic limb has been ongoing for a while given behavior of the muscle when touched by electrocautery.    He was extubated in PACU and was admitted to the ICU post operatively for continued monitoring and for management. He was confused overnight. 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 Patient did not revert with 3 doses of adenosine, nor with three attempts at electrical cardioversion. He was subsequently started on IV diltiazem with improvement.   We are called to the bedside due to narrow complex tachycardia, suggestive of AVNRT. 3 doses of adenosine attempted (6 mg followed by 12 mg x2) and 3 attempts at electrical cardioversion attempted, without success. Diltiazem gtt initiated.   11/5 The patient was taken to the OR for bilateral guillotine amputations above the knee with central line placement. Overnight the patient became bradycardic and pulses became absent and a subsequent code blue was called at 10:30 pm. The patient was resuscitated shortly after and family came to bedside where goals of care were discussed and the family decided to make the patient DNR.  Please see "Significant Hospital Events" section below for full detailed hospital course.   Significant Hospital Events: Including procedures, antibiotic start and stop dates in addition to other pertinent events   05/16/2023: admission, axillary to bifemoral bypass &  fasciotomy 05/16/2023: SVT, on diltiazem gtt 11/4 on AMIO, HEP, increased WOB 06/06/2023: The patient was taken to the OR for bilateral above the knee  amputation secondary to limb ischemia and gangrene. That patient returned from the OR intubated. Overnight the patient became bradycardiac but had ROSC shortly after. The family decided to make the patient DNR. 05/20/2023: The patient continues to decline, requiring mechanical ventilation, multiple medications for BP support and rhythm control.  Despite full support, pt expired while on vent and vasopressors.   Pertinent Labs and Studies  Significant Diagnostic Studies DG Abd 1 View  Result Date: 06/08/2023 CLINICAL DATA:  Enteric catheter placement EXAM: ABDOMEN - 1 VIEW COMPARISON:  None Available. FINDINGS: Frontal view of the lower chest and upper abdomen demonstrates enteric catheter tip and side port projecting over the gastric fundus. Nonspecific small-bowel gas is distension could reflect early/intermittent obstruction versus ileus. Lung bases are clear. IMPRESSION: 1. Enteric catheter tip and side port projecting over the gastric fundus. Electronically Signed   By: Sharlet Salina M.D.   On: 05/29/2023 19:28   DG Chest 1 View  Result Date: 06/11/2023 CLINICAL DATA:  Central line placement EXAM: CHEST  1 VIEW COMPARISON:  11/29/2022 FINDINGS: Single frontal view of the chest demonstrates endotracheal tube overlying tracheal air column, tip approximately 4 cm above carina. Left internal jugular central venous catheter tip overlies superior vena cava. External defibrillator pads overlie the chest. The cardiac silhouette is unremarkable. Bullous emphysematous changes are again identified, most pronounced at the right apex. No acute airspace disease, effusion, or pneumothorax. No acute bony abnormalities. Postsurgical changes right axilla. IMPRESSION: 1. No complication after central venous catheter placement and intubation. 2. Emphysema.  No acute airspace disease. Electronically Signed   By: Sharlet Salina M.D.   On: 06/10/2023 19:27   ECHOCARDIOGRAM COMPLETE  Result Date: 06/10/2023     ECHOCARDIOGRAM REPORT   Patient Name:   Timothy Galvan Date of Exam: 05/28/2023 Medical Rec #:  [id number]       Height:       63.0 in Accession #:    [id number]      Weight:       129.2 lb Date of Birth:  May 24, 1956       BSA:          1.606 m Patient Age:    67 years        BP:           154/81 mmHg Patient Gender: M               HR:           77 bpm. Exam Location:  [facility] Procedure: 2D Echo, Cardiac Doppler and Color Doppler Indications:     Elevated Troponin  History:         Patient has prior history of Echocardiogram examinations, most                  recent 03/01/2023. CAD, PAD, Stroke and COPD, Arrythmias:Atrial                  Fibrillation and Tachycardia, Signs/Symptoms:Chest Pain; Risk                  Factors:Hypertension, Sleep Apnea, Dyslipidemia and Current                  Smoker. ETOH use.  Sonographer:     Mikki Harbor Referring Phys:  [id number] BRITTON L [place] Diagnosing Phys:  Lorine Bears MD  Sonographer Comments: Technically difficult study due to poor echo windows, suboptimal parasternal window, suboptimal apical window and no subcostal window. Image acquisition challenging due to patient behavioral factors. IMPRESSIONS  1. Left ventricular ejection fraction, by estimation, is 40 to 45%. The left ventricle has mildly decreased function. Left ventricular endocardial border not optimally defined to evaluate regional wall motion. Left ventricular diastolic function could not be evaluated.  2. Right ventricular systolic function is normal. The right ventricular size is normal. Tricuspid regurgitation signal is inadequate for assessing PA pressure.  3. The mitral valve is normal in structure. Mild mitral valve regurgitation. No evidence of mitral stenosis.  4. The aortic valve is normal in structure. Aortic valve regurgitation is not visualized. Aortic valve sclerosis/calcification is present, without any evidence of aortic stenosis.  5. Technically difficult study due to poor echo  windows, suboptimal parasternal window, suboptimal apical window and no subcostal window. Image acquisition challenging due to patient behavioral factors. FINDINGS  Left Ventricle: Left ventricular ejection fraction, by estimation, is 40 to 45%. The left ventricle has mildly decreased function. Left ventricular endocardial border not optimally defined to evaluate regional wall motion. The left ventricular internal cavity size was normal in size. There is borderline left ventricular hypertrophy. Left ventricular diastolic function could not be evaluated. Right Ventricle: The right ventricular size is normal. No increase in right ventricular wall thickness. Right ventricular systolic function is normal. Tricuspid regurgitation signal is inadequate for assessing PA pressure. Left Atrium: Left atrial size was normal in size. Right Atrium: Right atrial size was normal in size. Pericardium: There is no evidence of pericardial effusion. Mitral Valve: The mitral valve is normal in structure. Mild mitral annular calcification. Mild mitral valve regurgitation. No evidence of mitral valve stenosis. Tricuspid Valve: The tricuspid valve is normal in structure. Tricuspid valve regurgitation is not demonstrated. No evidence of tricuspid stenosis. Aortic Valve: The aortic valve is normal in structure. Aortic valve regurgitation is not visualized. Aortic valve sclerosis/calcification is present, without any evidence of aortic stenosis. Pulmonic Valve: The pulmonic valve was normal in structure. Pulmonic valve regurgitation is not visualized. No evidence of pulmonic stenosis. Aorta: The aortic root is normal in size and structure. Venous: The inferior vena cava was not well visualized. IAS/Shunts: No atrial level shunt detected by color flow Doppler.  LEFT VENTRICLE PLAX 2D LVIDd:         5.20 cm LVIDs:         4.70 cm LV PW:         1.00 cm LV IVS:        1.10 cm LVOT diam:     2.20 cm LVOT Area:     3.80 cm  LEFT ATRIUM          Index LA diam:    3.50 cm 2.18 cm/m   AORTA Ao Root diam: 3.20 cm  SHUNTS Systemic Diam: 2.20 cm Lorine Bears MD Electronically signed by Lorine Bears MD Signature Date/Time: 05/25/2023/1:31:03 PM    Final    CT HEAD WO CONTRAST ( )  Result Date: 06/08/2023 CLINICAL DATA:  Mental status change with unknown cause. EXAM: CT HEAD WITHOUT CONTRAST TECHNIQUE: Contiguous axial images were obtained from the base of the skull through the vertex without intravenous contrast. RADIATION DOSE REDUCTION: This exam was performed according to the departmental dose-optimization program which includes automated exposure control, adjustment of the mA and/or kV according to patient size and/or use of iterative reconstruction technique. COMPARISON:  11/29/2022 FINDINGS:  Brain: No evidence of acute infarction, hemorrhage, hydrocephalus, extra-axial collection or mass lesion/mass effect. Chronic small vessel ischemia in the cerebral white matter with chronic perforator infarct at the left basal ganglia and posterior right frontal cortex. Brain atrophy that is generalized. Cerebellar atrophy. Vascular: No focal hyperdense vessel or unexpected calcification. Recent enhanced scan. Skull: Normal. Negative for fracture or focal lesion. Sinuses/Orbits: No acute finding. IMPRESSION: 1. No emergent finding . 2. Chronic perforator and right frontal cortex infarcts. Electronically Signed   By: Tiburcio Pea M.D.   On: 06/06/2023 09:00   CT CHEST W CONTRAST  Result Date: 06/01/2023 CLINICAL DATA:  Emphysema, tobacco abuse, fatigue EXAM: CT CHEST WITH CONTRAST TECHNIQUE: Multidetector CT imaging of the chest was performed during intravenous contrast administration. RADIATION DOSE REDUCTION: This exam was performed according to the departmental dose-optimization program which includes automated exposure control, adjustment of the mA and/or kV according to patient size and/or use of iterative reconstruction technique. CONTRAST:  75mL  OMNIPAQUE IOHEXOL 300 MG/ML  SOLN COMPARISON:  03/26/2023 FINDINGS: Cardiovascular: Moderate multi-vessel coronary artery calcification. Global cardiac size within normal limits. No pericardial effusion. Central pulmonary arteries are of normal caliber. There is adequate opacification of the pulmonary arterial tree and there is no intraluminal filling defect identified through the segmental level to suggest acute pulmonary embolism. Moderate atherosclerotic calcification within the thoracic aorta. No aortic aneurysm. 50% stenosis of the proximal left subclavian artery secondary to calcified atherosclerotic plaque. Mediastinum/Nodes: No enlarged mediastinal, hilar, or axillary lymph nodes. Thyroid gland, trachea, and esophagus demonstrate no significant findings. Lungs/Pleura: Moderate emphysema with right apical bulla formation, stable since prior examination. No superimposed confluent pulmonary infiltrate. No pneumothorax or pleural effusion. Bronchial wall thickening noted centrally in keeping with chronic airway inflammation. Upper Abdomen: No acute abnormality. Musculoskeletal: Acute to subacute T12 superior endplate fracture with mild loss of height anteriorly and no retropulsion. No lytic or blastic bone lesion. IMPRESSION: 1. No acute intrathoracic pathology identified. 2. Moderate multi-vessel coronary artery calcification. 3. Moderate emphysema 4. Acute to subacute T12 superior endplate fracture with mild loss of height anteriorly and no retropulsion. Aortic Atherosclerosis (ICD10-I70.0) and Emphysema (ICD10-J43.9). Electronically Signed   By: Helyn Numbers M.D.   On: 05/30/2023 02:19   CT Angio Aortobifemoral W and/or Wo Contrast  Result Date: 05/26/2023 CLINICAL DATA:  Claudication or leg ischemia. Right leg paresthesia, coolness. EXAM: CT ANGIOGRAPHY OF ABDOMINAL AORTA WITH ILIOFEMORAL RUNOFF TECHNIQUE: Multidetector CT imaging of the abdomen, pelvis and lower extremities was performed using the  standard protocol during bolus administration of intravenous contrast. Multiplanar CT image reconstructions and MIPs were obtained to evaluate the vascular anatomy. RADIATION DOSE REDUCTION: This exam was performed according to the departmental dose-optimization program which includes automated exposure control, adjustment of the mA and/or kV according to patient size and/or use of iterative reconstruction technique. CONTRAST:  OMNIPAQUE IOHEXOL 350 MG/ML SOLN COMPARISON:  None Available. FINDINGS: VASCULAR Aorta: There is thrombosis of the infrarenal abdominal aorta. The suprarenal and juxtarenal abdominal aorta are of normal caliber. No aneurysm or dissection. Celiac: Patent without evidence of aneurysm, dissection, vasculitis or significant stenosis. SMA: Patent without evidence of aneurysm, dissection, vasculitis or significant stenosis. Renals: Both renal arteries are patent without evidence of aneurysm, dissection, vasculitis, fibromuscular dysplasia or significant stenosis. IMA: Thrombosed at its origin. Reconstituted by the marginal artery. Distally widely patent. RIGHT Lower Extremity Inflow: The stented right common iliac and external iliac arteries are thrombosed. The right internal iliac artery is heavily calcified and chronically thrombosed  with reconstitution of the peripheral arterial branches of the internal iliac artery distribution via iliolumbar, rectal arcade and external-internal pudendal collateralization. Outflow: Femoral-femoral bypass graft is thrombosed. Reconstitution of the right common femoral artery via circumflex iliac and inferior epigastric arteries with high-grade stenoses at the point of collateralization. 50% stenosis of the profundus femoral artery at its origin. Distally, this vessel is widely patent. The superficial femoral artery is proximally thrombosed. Runoff: Reconstitution of the popliteal artery via geniculate collaterals the popliteal artery is diminutive, likely  related to poor inflow. Scattered calcification involving the terminal popliteal artery and proximal runoff vasculature noted with thrombosis of the anterior tibial and peroneal arteries proximally. The posterior tibial artery appears thrombosed at its origin but quickly reconstitutes. Distal non opacification likely related to suboptimal bolus timing due to poor inflow. LEFT Lower Extremity Inflow: Chronically thrombosed left common and external iliac arteries. The left internal iliac artery is chronically thrombosed. Collateralization of the distal internal iliac arterial distribution via ileo lumbar, rectal arcade, and internal-external pudendal collaterals. Outflow: Reconstitution of the left common femoral artery via circumflex iliac and inferior epigastric collaterals. High-grade stenosis at the confluence of the inferior epigastric artery and common femoral artery. Fem-fem bypass graft is thrombosed. Profundus femoral and superficial femoral arteries are widely patent. Runoff: There is 50% focal stenosis of the P2 segment of the popliteal artery. There is three-vessel runoff to the left ankle with patency of the plantar arch. The dorsalis pedis artery is not clearly patent. Veins: No obvious venous abnormality within the limitations of this arterial phase study. Review of the MIP images confirms the above findings. NON-VASCULAR Lower chest: No acute abnormality. Hepatobiliary: No focal liver abnormality is seen. No gallstones, gallbladder wall thickening, or biliary dilatation. Pancreas: Unremarkable Spleen: Unremarkable Adrenals/Urinary Tract: Adrenal glands are unremarkable. Kidneys are normal, without renal calculi, focal lesion, or hydronephrosis. Bladder is unremarkable. Stomach/Bowel: Stomach is within normal limits. Appendix appears normal. No evidence of bowel wall thickening, distention, or inflammatory changes. Lymphatic: No pathologic adenopathy within the abdomen and pelvis. Reproductive: Mild  prostatic hypertrophy Other: None significant Musculoskeletal: Avascular necrosis of the femoral heads bilaterally without articular collapse. Remote superior endplate fracture of L1. Acute to subacute superior endplate fracture of T12 with mild loss of height and no retropulsion. IMPRESSION: VASCULAR Thrombosis of the infrarenal abdominal aorta and lower extremity inflow bilaterally with reconstitution of lower extremity arterial outflow via posterior collaterals. Superimposed high-grade stenoses at the juncture of the collateral vessels and common femoral arteries bilaterally. Thrombosis of a fem-fem bypass graft. Thrombosis of the right superficial femoral artery. Reconstitution of the right popliteal artery via geniculate collaterals. Thrombosis of the a right anterior tibial and peroneal arteries proximally. Patency of the right posterior tibial artery to the level of the distal diaphysis of the tibia with poor inflow and limited opacification precluding evaluation of distal patency. Widely patent left lower extremity arterial outflow and runoff. Thrombosis of the left dorsalis pedis artery. 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 Acute to subacute T12 superior endplate fracture.  No retropulsion. Electronically Signed   By: Helyn Numbers M.D.   On: 05/26/2023 02:14    Microbiology Recent Results (from the past 240 hour(s))  MRSA Next Gen by PCR, Nasal     Status: None   Collection Time: 05/22/2023  5:22 PM   Specimen: Nasal Mucosa; Nasal Swab  Result Value Ref Range Status   MRSA by PCR Next Gen NOT DETECTED NOT DETECTED Final  Comment: (NOTE) The GeneXpert MRSA Assay (FDA approved for NASAL specimens only), is one component of a comprehensive MRSA colonization surveillance program. It is not intended to diagnose MRSA infection nor to guide or monitor treatment for MRSA infections. Test performance is not FDA approved in patients less than 46  years old. Performed at [facility], [address] [place], [place] [address]     Lab Basic Metabolic Panel: Recent Labs  Lab 05/16/23 214 188 0159 05/17/23 0400 05/17/23 1201 06/09/2023 0427 06/10/2023 0921 06/11/2023 1715 06/01/2023 2005 06/03/2023 2222 05/22/2023 2310 05/21/2023 0446  NA 136 137  --  135  --   --   --  134* 136 140  K 4.6 4.9  --  4.4  --   --   --  7.4* 6.5* 5.4*  CL 105 104  --  102  --   --   --  104 102 98  CO2 20* 22  --  20*  --   --   --  9* 14* 14*  GLUCOSE 163* 147*   < > 95   < > 157* 193* 154* 160* 214*  BUN 23 41*  --  38*  --   --   --  38* 40* 42*  CREATININE 1.04 1.64*  --  1.26*  --   --   --  2.46* 2.51* 2.96*  CALCIUM 9.0 8.2*  --  7.5*  --   --   --  7.2* 9.8 7.7*  MG 2.0 2.4  --  2.4  --   --   --   --  3.7* 3.7*  PHOS 3.7  --   --  3.1  --   --   --   --   --  15.3*   < > = values in this interval not displayed.   Liver Function Tests: Recent Labs  Lab 05/31/2023 0446  AST 12,615*  ALT 7,751*  ALKPHOS 204*  BILITOT 2.3*  PROT 4.4*  ALBUMIN 1.9*   No results for input(s): "LIPASE", "AMYLASE" in the last 168 hours. No results for input(s): "AMMONIA" in the last 168 hours. CBC: Recent Labs  Lab 06/10/2023 0001 05/16/23 0547 05/17/23 0400 06/03/2023 0427 06/01/2023 1715 05/30/2023 2222 05/23/2023 0446  WBC 9.7   < > 7.9 8.9 9.5 8.8 6.1  NEUTROABS 5.8  --   --   --  6.7  --   --   HGB 15.9   < > 12.9* 11.8* 10.6* 9.1* 8.7*  HCT 48.2   < > 38.7* 35.8* 32.5* 30.9* 29.1*  MCV 90.8   < > 89.4 91.1 91.0 101.3* 99.0  PLT 183   < > 142* 132* 164 150 85*   < > = values in this interval not displayed.   Cardiac Enzymes: Recent Labs  Lab 05/16/23 0821 05/27/2023 0427  CKTOTAL 10,993* 31,352*   Sepsis Labs: Recent Labs  Lab 05/17/23 1851 05/29/2023 0427 06/03/2023 1715 05/20/2023 2222 05/26/2023 0446  WBC  --  8.9 9.5 8.8 6.1  LATICACIDVEN 4.6* 5.7*  --  >9.0* >9.0*    Procedures/Operations  11/2: Axillary to bifemoral bypass &  fasciotomy 11/3: Synchronized cardioversion 11/5: Guillotine amputation above the knee right lower extremity. 11/5: Guillotine amputation above the knee left lower extremity. 11/5: Insertion of a left IJ central line with ultrasound guidance.      Harlon Ditty, AGACNP-BC [facility] Prefer epic messenger for cross cover needs If after hours, please call E-link  Jeri Modena  D Elvina Sidle 05/17/2023, 3:34 PM

## 2023-06-13 NOTE — Progress Notes (Signed)
1134 daughter called to inform her of patients Time of death 1131am

## 2023-06-13 NOTE — Progress Notes (Signed)
Rounding Note    Patient Name: Timothy Galvan Date of Encounter: 05/16/2023  [facility] Cardiologist: Debbe Odea, MD   Subjective   Critically ill patient, status post bilateral amputations with residual leg gangrene No family at the bedside Cardiogenic and septic shock, On 3 pressors, vasopressin 0.4, epi 8, nor epi 30  blood pressure 80 systolic on rounds Not on sedation Intubated 10:30 PM last night with profound bradycardic arrest rates in the 20s Resuscitated, 1 round of CPR 3 PM with additional bradycardia rate in the 30s, brief Currently rates in the 90s Amiodarone held last night Troponin up to greater than 24,000 up from 11,000 Heparin held yesterday for weeping surgical site requiring restitching and heparin hold Wound VAC with small amount bloody drainage  Inpatient Medications    Scheduled Meds:  aspirin  81 mg Oral Daily   Chlorhexidine Gluconate Cloth  6 each Topical Daily   docusate  100 mg Oral BID   fentaNYL (SUBLIMAZE) injection  25 mcg Intravenous Once   folic acid  1 mg Intravenous Daily   hydrocortisone sod succinate (SOLU-CORTEF) inj  50 mg Intravenous Q8H   insulin aspart  0-6 Units Subcutaneous Q4H   multivitamin with minerals  1 tablet Oral Daily   nicotine  21 mg Transdermal Daily   mouth rinse  15 mL Mouth Rinse 4 times per day   pantoprazole (PROTONIX) IV  40 mg Intravenous Daily   [START ON 05/20/2023] polyethylene glycol  17 g Oral Daily   sodium bicarbonate  50 mEq Intravenous Once   thiamine  100 mg Oral Daily   Or   thiamine  100 mg Intravenous Daily   Continuous Infusions:  amiodarone Stopped (05/22/2023 2125)   epinephrine 12 mcg/min (06/06/2023 1021)   fentaNYL infusion INTRAVENOUS Stopped (05/20/2023 2125)   norepinephrine (LEVOPHED) Adult infusion 28 mcg/min (05/26/2023 0819)   propofol (DIPRIVAN) infusion Stopped (06/08/2023 2125)   sodium bicarbonate 150 mEq in sterile water 1,150 mL infusion 125 mL/hr at 05/20/2023  0800   vasopressin 0.04 Units/min (06/10/2023 1029)   PRN Meds: acetaminophen **OR** acetaminophen, fentaNYL, hydrALAZINE, HYDROcodone-acetaminophen, HYDROmorphone (DILAUDID) injection, ipratropium-albuterol, LORazepam, nitroGLYCERIN, ondansetron **OR** ondansetron (ZOFRAN) IV, mouth rinse, traZODone   Vital Signs    Vitals:   06/04/2023 0855 05/16/2023 0900 06/09/2023 0910 06/07/2023 0915  BP: (!) 76/48 (!) 75/59 (!) 77/68 (!) 76/46  Pulse:      Resp: 20 20 20 20   Temp:    97.8 F (36.6 C)  TempSrc:    Oral  SpO2:      Weight:      Height:        Intake/Output Summary (Last 24 hours) at 06/07/2023 1143 Last data filed at 05/16/2023 0800 Gross per 24 hour  Intake 2258.03 ml  Output 1320 ml  Net 938.03 ml      06/06/2023    7:00 AM 06/04/2023    5:00 AM 05/31/2023    4:00 PM  Last 3 Weights  Weight (lbs) 126 lb 5.2 oz 129 lb 3 oz 138 lb 7.2 oz  Weight (kg) 57.3 kg 58.6 kg 62.8 kg      Telemetry    Normal sinus rhythm- Personally Reviewed  ECG     - Personally Reviewed  Physical Exam   GEN: Intubated sedated Neck: No JVD Cardiac: RRR, no murmurs, rubs, or gallops.  Respiratory: Coarse breath sounds GI: Soft, nontender, non-distended  MS: Bilateral lower extremity amputations Neuro: Pupils fixed and dilated Psych: Unable to assess  Labs    High Sensitivity Troponin:   Recent Labs  Lab 05/16/2023 0051 06/08/2023 0427 05/26/2023 0921 06/09/2023 1214 05/30/2023 1715  TROPONINIHS 3,542* 5,216* 11,231* 11,071* >24,000*     Chemistry Recent Labs  Lab 05/22/2023 0427 06/10/2023 0921 05/22/2023 2222 05/27/2023 2310 05/16/2023 0446  NA 135  --  134* 136 140  K 4.4  --  7.4* 6.5* 5.4*  CL 102  --  104 102 98  CO2 20*  --  9* 14* 14*  GLUCOSE 95   < > 154* 160* 214*  BUN 38*  --  38* 40* 42*  CREATININE 1.26*  --  2.46* 2.51* 2.96*  CALCIUM 7.5*  --  7.2* 9.8 7.7*  MG 2.4  --   --  3.7* 3.7*  PROT  --   --   --   --  4.4*  ALBUMIN  --   --   --   --  1.9*  AST  --   --   --    --  12,615*  ALT  --   --   --   --  7,751*  ALKPHOS  --   --   --   --  204*  BILITOT  --   --   --   --  2.3*  GFRNONAA >60  --  28* 27* 22*  ANIONGAP 13  --  21* 20* 28*   < > = values in this interval not displayed.    Lipids  Recent Labs  Lab 05/16/23 0821 05/16/2023 0450  CHOL 193  --   TRIG 93 99  HDL 68  --   LDLCALC 106*  --   CHOLHDL 2.8  --     Hematology Recent Labs  Lab 06/01/2023 1715 05/28/2023 2222 06/07/2023 0446  WBC 9.5 8.8 6.1  RBC 3.57* 3.05* 2.94*  HGB 10.6* 9.1* 8.7*  HCT 32.5* 30.9* 29.1*  MCV 91.0 101.3* 99.0  MCH 29.7 29.8 29.6  MCHC 32.6 29.4* 29.9*  RDW 14.1 14.5 14.6  PLT 164 150 85*   Thyroid  Recent Labs  Lab 05/16/23 0821  TSH 0.747    BNPNo results for input(s): "BNP", "PROBNP" in the last 168 hours.  DDimer No results for input(s): "DDIMER" in the last 168 hours.   Radiology    DG Abd 1 View  Result Date: 06/06/2023 CLINICAL DATA:  Enteric catheter placement EXAM: ABDOMEN - 1 VIEW COMPARISON:  None Available. FINDINGS: Frontal view of the lower chest and upper abdomen demonstrates enteric catheter tip and side port projecting over the gastric fundus. Nonspecific small-bowel gas is distension could reflect early/intermittent obstruction versus ileus. Lung bases are clear. IMPRESSION: 1. Enteric catheter tip and side port projecting over the gastric fundus. Electronically Signed   By: Sharlet Salina M.D.   On: 06/02/2023 19:28   DG Chest 1 View  Result Date: 05/28/2023 CLINICAL DATA:  Central line placement EXAM: CHEST  1 VIEW COMPARISON:  11/29/2022 FINDINGS: Single frontal view of the chest demonstrates endotracheal tube overlying tracheal air column, tip approximately 4 cm above carina. Left internal jugular central venous catheter tip overlies superior vena cava. External defibrillator pads overlie the chest. The cardiac silhouette is unremarkable. Bullous emphysematous changes are again identified, most pronounced at the right apex. No  acute airspace disease, effusion, or pneumothorax. No acute bony abnormalities. Postsurgical changes right axilla. IMPRESSION: 1. No complication after central venous catheter placement and intubation. 2. Emphysema.  No acute airspace disease. Electronically Signed  By: Sharlet Salina M.D.   On: 05/17/2023 19:27   ECHOCARDIOGRAM COMPLETE  Result Date: 05/20/2023    ECHOCARDIOGRAM REPORT   Patient Name:   JAGER KOSKA Date of Exam: 06/12/2023 Medical Rec #:  [id number]       Height:       63.0 in Accession #:    [id number]      Weight:       129.2 lb Date of Birth:  25-Aug-1955       BSA:          1.606 m Patient Age:    67 years        BP:           154/81 mmHg Patient Gender: M               HR:           77 bpm. Exam Location:  [facility] Procedure: 2D Echo, Cardiac Doppler and Color Doppler Indications:     Elevated Troponin  History:         Patient has prior history of Echocardiogram examinations, most                  recent 03/01/2023. CAD, PAD, Stroke and COPD, Arrythmias:Atrial                  Fibrillation and Tachycardia, Signs/Symptoms:Chest Pain; Risk                  Factors:Hypertension, Sleep Apnea, Dyslipidemia and Current                  Smoker. ETOH use.  Sonographer:     Mikki Harbor Referring Phys:  [id number] BRITTON L RUST-CHESTER Diagnosing Phys: Lorine Bears MD  Sonographer Comments: Technically difficult study due to poor echo windows, suboptimal parasternal window, suboptimal apical window and no subcostal window. Image acquisition challenging due to patient behavioral factors. IMPRESSIONS  1. Left ventricular ejection fraction, by estimation, is 40 to 45%. The left ventricle has mildly decreased function. Left ventricular endocardial border not optimally defined to evaluate regional wall motion. Left ventricular diastolic function could not be evaluated.  2. Right ventricular systolic function is normal. The right ventricular size is normal. Tricuspid regurgitation signal is inadequate  for assessing PA pressure.  3. The mitral valve is normal in structure. Mild mitral valve regurgitation. No evidence of mitral stenosis.  4. The aortic valve is normal in structure. Aortic valve regurgitation is not visualized. Aortic valve sclerosis/calcification is present, without any evidence of aortic stenosis.  5. Technically difficult study due to poor echo windows, suboptimal parasternal window, suboptimal apical window and no subcostal window. Image acquisition challenging due to patient behavioral factors. FINDINGS  Left Ventricle: Left ventricular ejection fraction, by estimation, is 40 to 45%. The left ventricle has mildly decreased function. Left ventricular endocardial border not optimally defined to evaluate regional wall motion. The left ventricular internal cavity size was normal in size. There is borderline left ventricular hypertrophy. Left ventricular diastolic function could not be evaluated. Right Ventricle: The right ventricular size is normal. No increase in right ventricular wall thickness. Right ventricular systolic function is normal. Tricuspid regurgitation signal is inadequate for assessing PA pressure. Left Atrium: Left atrial size was normal in size. Right Atrium: Right atrial size was normal in size. Pericardium: There is no evidence of pericardial effusion. Mitral Valve: The mitral valve is normal in structure. Mild mitral annular calcification. Mild mitral valve regurgitation. No  evidence of mitral valve stenosis. Tricuspid Valve: The tricuspid valve is normal in structure. Tricuspid valve regurgitation is not demonstrated. No evidence of tricuspid stenosis. Aortic Valve: The aortic valve is normal in structure. Aortic valve regurgitation is not visualized. Aortic valve sclerosis/calcification is present, without any evidence of aortic stenosis. Pulmonic Valve: The pulmonic valve was normal in structure. Pulmonic valve regurgitation is not visualized. No evidence of pulmonic  stenosis. Aorta: The aortic root is normal in size and structure. Venous: The inferior vena cava was not well visualized. IAS/Shunts: No atrial level shunt detected by color flow Doppler.  LEFT VENTRICLE PLAX 2D LVIDd:         5.20 cm LVIDs:         4.70 cm LV PW:         1.00 cm LV IVS:        1.10 cm LVOT diam:     2.20 cm LVOT Area:     3.80 cm  LEFT ATRIUM         Index LA diam:    3.50 cm 2.18 cm/m   AORTA Ao Root diam: 3.20 cm  SHUNTS Systemic Diam: 2.20 cm Lorine Bears MD Electronically signed by Lorine Bears MD Signature Date/Time: 06/10/2023/1:31:03 PM    Final     Cardiac Studies   Echo performed yesterday prior to events overnight  1. Left ventricular ejection fraction, by estimation, is 40 to 45%. The  left ventricle has mildly decreased function. Left ventricular endocardial  border not optimally defined to evaluate regional wall motion. Left  ventricular diastolic function could  not be evaluated.   2. Right ventricular systolic function is normal. The right ventricular  size is normal. Tricuspid regurgitation signal is inadequate for assessing  PA pressure.   3. The mitral valve is normal in structure. Mild mitral valve  regurgitation. No evidence of mitral stenosis.   4. The aortic valve is normal in structure. Aortic valve regurgitation is  not visualized. Aortic valve sclerosis/calcification is present, without  any evidence of aortic stenosis.   5. Technically difficult study due to poor echo windows, suboptimal  parasternal window, suboptimal apical window and no subcostal window.  Image acquisition challenging due to patient behavioral factors.    Patient Profile     67 y.o. 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   Multiorgan system failure Bilateral leg wounds, myonecrosis left stump extending into hip Septic and cardiogenic shock following bilateral amputations Events overnight reviewed with cardiac arrest requiring resuscitation, 1 round of CPR On 3 pressors with worsening hypotension this morning Severe rhabdo Pupils fixed and dilated Not a candidate for additional surgeries on his legs given unstable hemodynamics Family aware, has made him DNR Heparin on hold in the setting of bleeding from surgical site Troponin greater than 24,000, unable to exclude primary ischemic event yesterday evening Not a candidate for ischemic workup Supportive care at this time but outlook appears grim Case discussed with ICU team and nursing Would continue pressors, bicarb drip, comfort   For questions or updates, please contact [facility] Please consult [email or web address] for contact info under  Signed, Julien Nordmann, MD  05/26/2023, 11:43 AM

## 2023-06-13 NOTE — IPAL (Signed)
  Interdisciplinary Goals of Care Family Meeting   Date carried out: 06/05/2023  Location of the meeting: Bedside  Member's involved: Nurse Practitioner, Bedside Registered Nurse, and Family Member or next of kin  Durable Power of Attorney or acting medical decision maker: 3 daughters present including [name] & Ladanna    Discussion: We discussed goals of care for Timothy Galvan. 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 Patient with ongoing N-STEMI in the setting of HFrEF, pSVT & PAF with multiorgan failure. This evening patient became severely bradycardic, almost cardiac arresting - stabilized with epinephrine & levophed. An hour later the patient briefly cardiac arrested for one round of ACLS prior to ROSC with follow up lab work revealing significant metabolic acidosis, hyperkalemia and worsening AKI. Discussed clinical status at length describing DNR status vs comfort measures. Decision to change CODE status to DNR at this time.  Code status:   Code Status: Limited: Do not attempt resuscitation (DNR) -DNR-LIMITED -Do Not Intubate/DNI    Disposition: Continue current acute care  Time spent for the meeting: 25 minutes    Cecelia Byars Rust-Chester, NP  05/28/2023, 1:15 AM  Cheryll Cockayne Rust-Chester, AGACNP-BC Acute Care Nurse Practitioner [facility]   [phone] / [phone] Please see Amion for details.

## 2023-06-13 NOTE — Anesthesia Postprocedure Evaluation (Signed)
Anesthesia Post Note  Patient: JULIES CARMICKLE  Procedure(s) Performed: BILATERAL GUILLOTINE AMPUTATION ABOVE KNEE (Bilateral: Leg Upper) APPLICATION OF WOUND VAC (Bilateral: Leg Upper)  Patient location during evaluation: ICU Anesthesia Type: General Level of consciousness: patient remains intubated per anesthesia plan and sedated Pain management: pain level controlled Vital Signs Assessment: post-procedure vital signs reviewed and stable Respiratory status: patient remains intubated per anesthesia plan Cardiovascular status: stable Anesthetic complications: no   No notable events documented.   Last Vitals:  Vitals:   05/21/2023 0645 06/09/2023 0700  BP: (!) 140/105 (!) 146/99  Pulse:    Resp: (!) 21 (!) 21  Temp:    SpO2:      Last Pain:  Vitals:   05/29/2023 0400  TempSrc: Axillary  PainSc:                  Katherine Basset

## 2023-06-13 NOTE — Progress Notes (Signed)
Unable to obtain temperature.  Dr. Belia Heman made aware and bair hugger applied to patient.

## 2023-06-13 NOTE — Progress Notes (Addendum)
Abnormal ABG results. Right pupil non-reactive and measured at 4, left pupil measures at 3 and sluggish.  Harlon Ditty, NP notified.  No new orders obtained at this time.

## 2023-06-13 NOTE — Progress Notes (Signed)
11:22 Patient with bradycardia with HR in 30 and then went asystole.  Patient DNR.  11:31 Patient pronounced dead by Karl Ito, RN and Irena Reichmann.

## 2023-06-13 DEATH — deceased

## 2023-07-20 ENCOUNTER — Ambulatory Visit: Payer: 59 | Admitting: Physical Medicine and Rehabilitation

## 2023-11-10 ENCOUNTER — Other Ambulatory Visit (HOSPITAL_COMMUNITY): Payer: Self-pay
